# Patient Record
Sex: Female | Born: 1935 | Hispanic: Yes | Marital: Married | Smoking: Never smoker
Health system: Southern US, Community
[De-identification: ages and names within clinical notes are randomized; demographics above are authoritative.]

## PROBLEM LIST (undated history)

## (undated) DIAGNOSIS — I10 Essential (primary) hypertension: Secondary | ICD-10-CM

## (undated) DIAGNOSIS — K5792 Diverticulitis of intestine, part unspecified, without perforation or abscess without bleeding: Secondary | ICD-10-CM

## (undated) DIAGNOSIS — H269 Unspecified cataract: Secondary | ICD-10-CM

## (undated) DIAGNOSIS — A048 Other specified bacterial intestinal infections: Secondary | ICD-10-CM

## (undated) DIAGNOSIS — Z86010 Personal history of colonic polyps: Secondary | ICD-10-CM

## (undated) DIAGNOSIS — E785 Hyperlipidemia, unspecified: Secondary | ICD-10-CM

## (undated) DIAGNOSIS — H409 Unspecified glaucoma: Secondary | ICD-10-CM

## (undated) DIAGNOSIS — Z860101 Personal history of adenomatous and serrated colon polyps: Secondary | ICD-10-CM

## (undated) DIAGNOSIS — I671 Cerebral aneurysm, nonruptured: Secondary | ICD-10-CM

## (undated) DIAGNOSIS — M1990 Unspecified osteoarthritis, unspecified site: Secondary | ICD-10-CM

## (undated) DIAGNOSIS — C4492 Squamous cell carcinoma of skin, unspecified: Secondary | ICD-10-CM

## (undated) DIAGNOSIS — K5790 Diverticulosis of intestine, part unspecified, without perforation or abscess without bleeding: Secondary | ICD-10-CM

## (undated) DIAGNOSIS — R011 Cardiac murmur, unspecified: Secondary | ICD-10-CM

## (undated) DIAGNOSIS — T7840XA Allergy, unspecified, initial encounter: Secondary | ICD-10-CM

## (undated) DIAGNOSIS — F419 Anxiety disorder, unspecified: Secondary | ICD-10-CM

## (undated) DIAGNOSIS — T148XXA Other injury of unspecified body region, initial encounter: Secondary | ICD-10-CM

## (undated) HISTORY — PX: BREAST BIOPSY: SHX20

## (undated) HISTORY — DX: Personal history of adenomatous and serrated colon polyps: Z86.0101

## (undated) HISTORY — DX: Unspecified glaucoma: H40.9

## (undated) HISTORY — DX: Diverticulosis of intestine, part unspecified, without perforation or abscess without bleeding: K57.90

## (undated) HISTORY — DX: Hyperlipidemia, unspecified: E78.5

## (undated) HISTORY — DX: Anxiety disorder, unspecified: F41.9

## (undated) HISTORY — DX: Unspecified osteoarthritis, unspecified site: M19.90

## (undated) HISTORY — DX: Diverticulitis of intestine, part unspecified, without perforation or abscess without bleeding: K57.92

## (undated) HISTORY — DX: Unspecified cataract: H26.9

## (undated) HISTORY — PX: BREAST LUMPECTOMY: SHX2

## (undated) HISTORY — DX: Essential (primary) hypertension: I10

## (undated) HISTORY — PX: COLONOSCOPY: SHX174

## (undated) HISTORY — DX: Cerebral aneurysm, nonruptured: I67.1

## (undated) HISTORY — DX: Other injury of unspecified body region, initial encounter: T14.8XXA

## (undated) HISTORY — DX: Personal history of colonic polyps: Z86.010

## (undated) HISTORY — DX: Squamous cell carcinoma of skin, unspecified: C44.92

## (undated) HISTORY — DX: Cardiac murmur, unspecified: R01.1

## (undated) HISTORY — DX: Other specified bacterial intestinal infections: A04.8

## (undated) HISTORY — DX: Allergy, unspecified, initial encounter: T78.40XA

---

## 1999-01-07 ENCOUNTER — Other Ambulatory Visit: Admission: RE | Admit: 1999-01-07 | Discharge: 1999-01-07 | Payer: Self-pay | Admitting: Gynecology

## 1999-04-18 DIAGNOSIS — A048 Other specified bacterial intestinal infections: Secondary | ICD-10-CM

## 1999-04-18 HISTORY — DX: Other specified bacterial intestinal infections: A04.8

## 1999-05-25 ENCOUNTER — Encounter (INDEPENDENT_AMBULATORY_CARE_PROVIDER_SITE_OTHER): Payer: Self-pay | Admitting: Specialist

## 1999-05-25 ENCOUNTER — Other Ambulatory Visit: Admission: RE | Admit: 1999-05-25 | Discharge: 1999-05-25 | Payer: Self-pay | Admitting: Internal Medicine

## 1999-05-25 ENCOUNTER — Encounter (INDEPENDENT_AMBULATORY_CARE_PROVIDER_SITE_OTHER): Payer: Self-pay | Admitting: *Deleted

## 2000-03-15 ENCOUNTER — Other Ambulatory Visit: Admission: RE | Admit: 2000-03-15 | Discharge: 2000-03-15 | Payer: Self-pay | Admitting: Gynecology

## 2001-03-18 ENCOUNTER — Other Ambulatory Visit: Admission: RE | Admit: 2001-03-18 | Discharge: 2001-03-18 | Payer: Self-pay | Admitting: Gynecology

## 2002-03-24 ENCOUNTER — Other Ambulatory Visit: Admission: RE | Admit: 2002-03-24 | Discharge: 2002-03-24 | Payer: Self-pay | Admitting: Gynecology

## 2003-03-30 ENCOUNTER — Other Ambulatory Visit: Admission: RE | Admit: 2003-03-30 | Discharge: 2003-03-30 | Payer: Self-pay | Admitting: Gynecology

## 2003-04-20 ENCOUNTER — Encounter (HOSPITAL_COMMUNITY): Admission: RE | Admit: 2003-04-20 | Discharge: 2003-07-19 | Payer: Self-pay | Admitting: Surgery

## 2004-04-28 ENCOUNTER — Other Ambulatory Visit: Admission: RE | Admit: 2004-04-28 | Discharge: 2004-04-28 | Payer: Self-pay | Admitting: Gynecology

## 2004-05-12 ENCOUNTER — Ambulatory Visit: Payer: Self-pay | Admitting: Internal Medicine

## 2004-08-11 ENCOUNTER — Ambulatory Visit: Payer: Self-pay | Admitting: Internal Medicine

## 2004-09-13 ENCOUNTER — Ambulatory Visit: Payer: Self-pay | Admitting: Internal Medicine

## 2004-10-27 ENCOUNTER — Ambulatory Visit: Payer: Self-pay | Admitting: Internal Medicine

## 2004-11-03 ENCOUNTER — Ambulatory Visit: Payer: Self-pay | Admitting: Internal Medicine

## 2004-11-09 ENCOUNTER — Ambulatory Visit: Payer: Self-pay | Admitting: Internal Medicine

## 2004-11-17 ENCOUNTER — Ambulatory Visit: Payer: Self-pay | Admitting: Internal Medicine

## 2005-01-26 ENCOUNTER — Ambulatory Visit: Payer: Self-pay | Admitting: Internal Medicine

## 2005-02-02 ENCOUNTER — Ambulatory Visit: Payer: Self-pay | Admitting: Internal Medicine

## 2005-04-27 ENCOUNTER — Ambulatory Visit: Payer: Self-pay | Admitting: Internal Medicine

## 2005-05-05 ENCOUNTER — Ambulatory Visit: Payer: Self-pay | Admitting: Internal Medicine

## 2005-09-13 ENCOUNTER — Ambulatory Visit: Payer: Self-pay | Admitting: Internal Medicine

## 2005-09-21 ENCOUNTER — Ambulatory Visit: Payer: Self-pay | Admitting: Internal Medicine

## 2005-10-23 ENCOUNTER — Ambulatory Visit: Payer: Self-pay | Admitting: Internal Medicine

## 2005-11-10 ENCOUNTER — Ambulatory Visit: Payer: Self-pay | Admitting: Internal Medicine

## 2005-12-15 ENCOUNTER — Ambulatory Visit: Payer: Self-pay | Admitting: Internal Medicine

## 2006-01-12 ENCOUNTER — Ambulatory Visit: Payer: Self-pay | Admitting: Internal Medicine

## 2006-01-15 ENCOUNTER — Encounter: Admission: RE | Admit: 2006-01-15 | Discharge: 2006-01-15 | Payer: Self-pay | Admitting: Internal Medicine

## 2006-02-13 ENCOUNTER — Ambulatory Visit: Payer: Self-pay | Admitting: Internal Medicine

## 2006-03-16 ENCOUNTER — Other Ambulatory Visit: Admission: RE | Admit: 2006-03-16 | Discharge: 2006-03-16 | Payer: Self-pay | Admitting: Gynecology

## 2006-05-15 ENCOUNTER — Ambulatory Visit: Payer: Self-pay | Admitting: Internal Medicine

## 2006-05-15 LAB — CONVERTED CEMR LAB: Hgb A1c MFr Bld: 6.8 % — ABNORMAL HIGH (ref 4.6–6.0)

## 2006-06-21 ENCOUNTER — Ambulatory Visit: Payer: Self-pay | Admitting: Internal Medicine

## 2006-08-23 ENCOUNTER — Ambulatory Visit: Payer: Self-pay | Admitting: Internal Medicine

## 2006-08-23 LAB — CONVERTED CEMR LAB
ALT: 29 units/L (ref 0–40)
AST: 32 units/L (ref 0–37)
Albumin: 3.9 g/dL (ref 3.5–5.2)
Alkaline Phosphatase: 75 units/L (ref 39–117)
Basophils Absolute: 0.1 10*3/uL (ref 0.0–0.1)
Basophils Relative: 0.8 % (ref 0.0–1.0)
Bilirubin, Direct: 0.1 mg/dL (ref 0.0–0.3)
Cholesterol: 191 mg/dL (ref 0–200)
Eosinophils Absolute: 0.7 10*3/uL — ABNORMAL HIGH (ref 0.0–0.6)
Eosinophils Relative: 8.6 % — ABNORMAL HIGH (ref 0.0–5.0)
HCT: 40.3 % (ref 36.0–46.0)
HDL: 51.7 mg/dL (ref >39.0)
Hemoglobin: 14.1 g/dL (ref 12.0–15.0)
Hgb A1c MFr Bld: 6.9 % — ABNORMAL HIGH (ref 4.6–6.0)
LDL Cholesterol: 121 mg/dL — ABNORMAL HIGH (ref 0–99)
Lymphocytes Relative: 28.5 % (ref 12.0–46.0)
MCHC: 35 g/dL (ref 30.0–36.0)
MCV: 90 fL (ref 78.0–100.0)
Monocytes Absolute: 0.7 10*3/uL (ref 0.2–0.7)
Monocytes Relative: 7.6 % (ref 3.0–11.0)
Neutro Abs: 4.7 10*3/uL (ref 1.4–7.7)
Neutrophils Relative %: 54.5 % (ref 43.0–77.0)
Platelets: 339 10*3/uL (ref 150–400)
RBC: 4.48 M/uL (ref 3.87–5.11)
RDW: 11.9 % (ref 11.5–14.6)
Total Bilirubin: 0.6 mg/dL (ref 0.3–1.2)
Total CHOL/HDL Ratio: 3.7
Total Protein: 7.6 g/dL (ref 6.0–8.3)
Triglycerides: 93 mg/dL (ref 0–149)
VLDL: 19 mg/dL (ref 0–40)
WBC: 8.7 10*3/uL (ref 4.5–10.5)

## 2006-10-25 ENCOUNTER — Ambulatory Visit: Payer: Self-pay | Admitting: Internal Medicine

## 2006-12-03 ENCOUNTER — Ambulatory Visit: Payer: Self-pay | Admitting: Internal Medicine

## 2006-12-03 DIAGNOSIS — M1990 Unspecified osteoarthritis, unspecified site: Secondary | ICD-10-CM | POA: Insufficient documentation

## 2006-12-03 DIAGNOSIS — M549 Dorsalgia, unspecified: Secondary | ICD-10-CM | POA: Insufficient documentation

## 2006-12-03 DIAGNOSIS — J309 Allergic rhinitis, unspecified: Secondary | ICD-10-CM | POA: Insufficient documentation

## 2006-12-03 DIAGNOSIS — M545 Low back pain, unspecified: Secondary | ICD-10-CM | POA: Insufficient documentation

## 2006-12-03 DIAGNOSIS — I10 Essential (primary) hypertension: Secondary | ICD-10-CM | POA: Insufficient documentation

## 2006-12-03 DIAGNOSIS — E119 Type 2 diabetes mellitus without complications: Secondary | ICD-10-CM | POA: Insufficient documentation

## 2006-12-03 DIAGNOSIS — M810 Age-related osteoporosis without current pathological fracture: Secondary | ICD-10-CM | POA: Insufficient documentation

## 2006-12-06 ENCOUNTER — Encounter: Admission: RE | Admit: 2006-12-06 | Discharge: 2006-12-06 | Payer: Self-pay | Admitting: Internal Medicine

## 2007-01-18 ENCOUNTER — Ambulatory Visit: Payer: Self-pay | Admitting: Internal Medicine

## 2007-01-18 LAB — CONVERTED CEMR LAB
ALT: 27 units/L (ref 0–35)
AST: 31 units/L (ref 0–37)
Albumin: 4.4 g/dL (ref 3.5–5.2)
Alkaline Phosphatase: 87 units/L (ref 39–117)
BUN: 21 mg/dL (ref 6–23)
Basophils Absolute: 0 10*3/uL (ref 0.0–0.1)
Basophils Relative: 0.5 % (ref 0.0–1.0)
Bilirubin Urine: NEGATIVE
Bilirubin, Direct: 0.2 mg/dL (ref 0.0–0.3)
CO2: 32 meq/L (ref 19–32)
Calcium: 10.2 mg/dL (ref 8.4–10.5)
Chloride: 104 meq/L (ref 96–112)
Cholesterol: 193 mg/dL (ref 0–200)
Creatinine, Ser: 0.9 mg/dL (ref 0.4–1.2)
Creatinine,U: 128 mg/dL
Eosinophils Absolute: 1.1 10*3/uL — ABNORMAL HIGH (ref 0.0–0.6)
Eosinophils Relative: 11.3 % — ABNORMAL HIGH (ref 0.0–5.0)
GFR calc Af Amer: 79 mL/min
GFR calc non Af Amer: 66 mL/min
Glucose, Bld: 135 mg/dL — ABNORMAL HIGH (ref 70–99)
Glucose, Urine, Semiquant: NEGATIVE
HCT: 40.5 % (ref 36.0–46.0)
HDL: 47 mg/dL (ref >39.0)
Hemoglobin: 14.2 g/dL (ref 12.0–15.0)
Hgb A1c MFr Bld: 7 % — ABNORMAL HIGH (ref 4.6–6.0)
Ketones, urine, test strip: NEGATIVE
LDL Cholesterol: 122 mg/dL — ABNORMAL HIGH (ref 0–99)
Lymphocytes Relative: 29.9 % (ref 12.0–46.0)
MCHC: 35 g/dL (ref 30.0–36.0)
MCV: 90.7 fL (ref 78.0–100.0)
Microalb Creat Ratio: 12.5 mg/g (ref 0.0–30.0)
Microalb, Ur: 1.6 mg/dL (ref 0.0–1.9)
Monocytes Absolute: 0.7 10*3/uL (ref 0.2–0.7)
Monocytes Relative: 7 % (ref 3.0–11.0)
Neutro Abs: 5 10*3/uL (ref 1.4–7.7)
Neutrophils Relative %: 51.3 % (ref 43.0–77.0)
Nitrite: NEGATIVE
Platelets: 351 10*3/uL (ref 150–400)
Potassium: 5.1 meq/L (ref 3.5–5.1)
Protein, U semiquant: NEGATIVE
RBC: 4.46 M/uL (ref 3.87–5.11)
RDW: 12.1 % (ref 11.5–14.6)
Sodium: 142 meq/L (ref 135–145)
Specific Gravity, Urine: 1.015
TSH: 0.73 microintl units/mL (ref 0.35–5.50)
Total Bilirubin: 0.9 mg/dL (ref 0.3–1.2)
Total CHOL/HDL Ratio: 4.1
Total Protein: 8 g/dL (ref 6.0–8.3)
Triglycerides: 121 mg/dL (ref 0–149)
Urobilinogen, UA: 0.2
VLDL: 24 mg/dL (ref 0–40)
WBC Urine, dipstick: NEGATIVE
WBC: 9.7 10*3/uL (ref 4.5–10.5)
pH: 8.5

## 2007-02-01 ENCOUNTER — Ambulatory Visit: Payer: Self-pay | Admitting: Internal Medicine

## 2007-03-19 ENCOUNTER — Ambulatory Visit: Payer: Self-pay | Admitting: Internal Medicine

## 2007-04-25 ENCOUNTER — Ambulatory Visit: Payer: Self-pay | Admitting: Internal Medicine

## 2007-04-25 LAB — CONVERTED CEMR LAB
BUN: 19 mg/dL (ref 6–23)
CO2: 33 meq/L — ABNORMAL HIGH (ref 19–32)
Calcium: 10.5 mg/dL (ref 8.4–10.5)
Chloride: 99 meq/L (ref 96–112)
Creatinine, Ser: 1 mg/dL (ref 0.4–1.2)
GFR calc Af Amer: 70 mL/min
GFR calc non Af Amer: 58 mL/min
Glucose, Bld: 91 mg/dL (ref 70–99)
Hgb A1c MFr Bld: 6.8 % — ABNORMAL HIGH (ref 4.6–6.0)
Potassium: 5.1 meq/L (ref 3.5–5.1)
Sodium: 139 meq/L (ref 135–145)

## 2007-05-02 ENCOUNTER — Other Ambulatory Visit: Admission: RE | Admit: 2007-05-02 | Discharge: 2007-05-02 | Payer: Self-pay | Admitting: Gynecology

## 2007-05-14 ENCOUNTER — Encounter: Payer: Self-pay | Admitting: Internal Medicine

## 2007-07-25 ENCOUNTER — Ambulatory Visit: Payer: Self-pay | Admitting: Internal Medicine

## 2007-07-25 LAB — CONVERTED CEMR LAB
Hgb A1c MFr Bld: 6.2 % — ABNORMAL HIGH (ref 4.6–6.0)
Vit D, 1,25-Dihydroxy: 38 (ref 30–89)

## 2007-09-03 ENCOUNTER — Ambulatory Visit: Payer: Self-pay | Admitting: Internal Medicine

## 2007-09-03 ENCOUNTER — Telehealth: Payer: Self-pay | Admitting: Internal Medicine

## 2007-09-03 DIAGNOSIS — J209 Acute bronchitis, unspecified: Secondary | ICD-10-CM | POA: Insufficient documentation

## 2007-09-03 LAB — CONVERTED CEMR LAB: Inflenza A Ag: NEGATIVE

## 2007-10-01 ENCOUNTER — Ambulatory Visit: Payer: Self-pay | Admitting: Internal Medicine

## 2007-10-01 DIAGNOSIS — J45991 Cough variant asthma: Secondary | ICD-10-CM | POA: Insufficient documentation

## 2007-10-01 DIAGNOSIS — B351 Tinea unguium: Secondary | ICD-10-CM | POA: Insufficient documentation

## 2007-11-26 ENCOUNTER — Ambulatory Visit: Payer: Self-pay | Admitting: Internal Medicine

## 2007-11-26 LAB — CONVERTED CEMR LAB
ALT: 34 units/L (ref 0–35)
AST: 36 units/L (ref 0–37)
Albumin: 4.3 g/dL (ref 3.5–5.2)
Alkaline Phosphatase: 78 units/L (ref 39–117)
Bilirubin, Direct: 0.1 mg/dL (ref 0.0–0.3)
Cholesterol: 226 mg/dL (ref 0–200)
Direct LDL: 151.1 mg/dL
HDL: 57.9 mg/dL (ref >39.0)
Hgb A1c MFr Bld: 6.9 % — ABNORMAL HIGH (ref 4.6–6.0)
Total Bilirubin: 0.8 mg/dL (ref 0.3–1.2)
Total CHOL/HDL Ratio: 3.9
Total Protein: 7.8 g/dL (ref 6.0–8.3)
Triglycerides: 129 mg/dL (ref 0–149)
VLDL: 26 mg/dL (ref 0–40)

## 2007-12-03 ENCOUNTER — Ambulatory Visit: Payer: Self-pay | Admitting: Internal Medicine

## 2008-01-16 ENCOUNTER — Ambulatory Visit: Payer: Self-pay | Admitting: Internal Medicine

## 2008-01-28 ENCOUNTER — Ambulatory Visit: Payer: Self-pay | Admitting: Internal Medicine

## 2008-01-28 LAB — CONVERTED CEMR LAB
ALT: 36 units/L — ABNORMAL HIGH (ref 0–35)
AST: 36 units/L (ref 0–37)
Albumin: 4.2 g/dL (ref 3.5–5.2)
Alkaline Phosphatase: 79 units/L (ref 39–117)
Bilirubin, Direct: 0.1 mg/dL (ref 0.0–0.3)
Cholesterol: 191 mg/dL (ref 0–200)
HDL: 51.6 mg/dL (ref >39.0)
Hgb A1c MFr Bld: 6.4 % — ABNORMAL HIGH (ref 4.6–6.0)
LDL Cholesterol: 115 mg/dL — ABNORMAL HIGH (ref 0–99)
Total Bilirubin: 0.7 mg/dL (ref 0.3–1.2)
Total CHOL/HDL Ratio: 3.7
Total Protein: 7.6 g/dL (ref 6.0–8.3)
Triglycerides: 121 mg/dL (ref 0–149)
VLDL: 24 mg/dL (ref 0–40)

## 2008-02-04 ENCOUNTER — Ambulatory Visit: Payer: Self-pay | Admitting: Internal Medicine

## 2008-03-11 ENCOUNTER — Ambulatory Visit: Payer: Self-pay | Admitting: Internal Medicine

## 2008-03-11 LAB — CONVERTED CEMR LAB
ALT: 34 units/L (ref 0–35)
AST: 39 units/L — ABNORMAL HIGH (ref 0–37)
Albumin: 4.5 g/dL (ref 3.5–5.2)
Alkaline Phosphatase: 79 units/L (ref 39–117)
BUN: 34 mg/dL — ABNORMAL HIGH (ref 6–23)
Basophils Absolute: 0.1 10*3/uL (ref 0.0–0.1)
Basophils Relative: 0.6 % (ref 0.0–3.0)
Bilirubin Urine: NEGATIVE
Bilirubin, Direct: 0.1 mg/dL (ref 0.0–0.3)
Blood in Urine, dipstick: NEGATIVE
CO2: 35 meq/L — ABNORMAL HIGH (ref 19–32)
Calcium: 10.4 mg/dL (ref 8.4–10.5)
Chloride: 103 meq/L (ref 96–112)
Cholesterol: 222 mg/dL (ref 0–200)
Creatinine, Ser: 1.2 mg/dL (ref 0.4–1.2)
Creatinine,U: 190.3 mg/dL
Direct LDL: 124.8 mg/dL
Eosinophils Absolute: 1 10*3/uL — ABNORMAL HIGH (ref 0.0–0.7)
Eosinophils Relative: 9.8 % — ABNORMAL HIGH (ref 0.0–5.0)
GFR calc Af Amer: 57 mL/min
GFR calc non Af Amer: 47 mL/min
Glucose, Bld: 157 mg/dL — ABNORMAL HIGH (ref 70–99)
Glucose, Urine, Semiquant: NEGATIVE
HCT: 43.1 % (ref 36.0–46.0)
HDL: 62.3 mg/dL (ref >39.0)
Hemoglobin: 14.8 g/dL (ref 12.0–15.0)
Hgb A1c MFr Bld: 6.7 % — ABNORMAL HIGH (ref 4.6–6.0)
Ketones, urine, test strip: NEGATIVE
Lymphocytes Relative: 23.2 % (ref 12.0–46.0)
MCHC: 34.3 g/dL (ref 30.0–36.0)
MCV: 93.3 fL (ref 78.0–100.0)
Microalb Creat Ratio: 5.8 mg/g (ref 0.0–30.0)
Microalb, Ur: 1.1 mg/dL (ref 0.0–1.9)
Monocytes Absolute: 0.7 10*3/uL (ref 0.1–1.0)
Monocytes Relative: 7.1 % (ref 3.0–12.0)
Neutro Abs: 5.8 10*3/uL (ref 1.4–7.7)
Neutrophils Relative %: 59.3 % (ref 43.0–77.0)
Nitrite: NEGATIVE
Platelets: 337 10*3/uL (ref 150–400)
Potassium: 5.1 meq/L (ref 3.5–5.1)
RBC: 4.62 M/uL (ref 3.87–5.11)
RDW: 12.1 % (ref 11.5–14.6)
Sodium: 146 meq/L — ABNORMAL HIGH (ref 135–145)
Specific Gravity, Urine: 1.015
TSH: 1.19 microintl units/mL (ref 0.35–5.50)
Total Bilirubin: 0.9 mg/dL (ref 0.3–1.2)
Total CHOL/HDL Ratio: 3.6
Total Protein: 8.7 g/dL — ABNORMAL HIGH (ref 6.0–8.3)
Triglycerides: 87 mg/dL (ref 0–149)
Urobilinogen, UA: 0.2
VLDL: 17 mg/dL (ref 0–40)
WBC: 9.9 10*3/uL (ref 4.5–10.5)
pH: 8.5

## 2008-03-18 ENCOUNTER — Ambulatory Visit: Payer: Self-pay | Admitting: Internal Medicine

## 2008-03-18 DIAGNOSIS — K141 Geographic tongue: Secondary | ICD-10-CM | POA: Insufficient documentation

## 2008-05-01 ENCOUNTER — Encounter: Payer: Self-pay | Admitting: Internal Medicine

## 2008-05-05 ENCOUNTER — Other Ambulatory Visit: Admission: RE | Admit: 2008-05-05 | Discharge: 2008-05-05 | Payer: Self-pay | Admitting: Gynecology

## 2008-05-05 ENCOUNTER — Encounter: Payer: Self-pay | Admitting: Internal Medicine

## 2008-05-05 ENCOUNTER — Encounter: Payer: Self-pay | Admitting: Gynecology

## 2008-05-05 ENCOUNTER — Ambulatory Visit: Payer: Self-pay | Admitting: Gynecology

## 2008-05-20 ENCOUNTER — Ambulatory Visit: Payer: Self-pay | Admitting: Internal Medicine

## 2008-05-20 LAB — CONVERTED CEMR LAB
BUN: 15 mg/dL (ref 6–23)
CO2: 32 meq/L (ref 19–32)
Calcium: 10.2 mg/dL (ref 8.4–10.5)
Chloride: 103 meq/L (ref 96–112)
Creatinine, Ser: 0.8 mg/dL (ref 0.4–1.2)
GFR calc Af Amer: 91 mL/min
GFR calc non Af Amer: 75 mL/min
Glucose, Bld: 131 mg/dL — ABNORMAL HIGH (ref 70–99)
Hgb A1c MFr Bld: 6.6 % — ABNORMAL HIGH (ref 4.6–6.0)
Potassium: 4.8 meq/L (ref 3.5–5.1)
Sodium: 140 meq/L (ref 135–145)

## 2008-05-25 DIAGNOSIS — K5790 Diverticulosis of intestine, part unspecified, without perforation or abscess without bleeding: Secondary | ICD-10-CM | POA: Insufficient documentation

## 2008-05-26 ENCOUNTER — Ambulatory Visit: Payer: Self-pay | Admitting: Internal Medicine

## 2008-05-26 DIAGNOSIS — R142 Eructation: Secondary | ICD-10-CM

## 2008-05-26 DIAGNOSIS — R143 Flatulence: Secondary | ICD-10-CM

## 2008-05-26 DIAGNOSIS — R141 Gas pain: Secondary | ICD-10-CM | POA: Insufficient documentation

## 2008-05-26 DIAGNOSIS — K5520 Angiodysplasia of colon without hemorrhage: Secondary | ICD-10-CM | POA: Insufficient documentation

## 2008-05-26 DIAGNOSIS — K625 Hemorrhage of anus and rectum: Secondary | ICD-10-CM | POA: Insufficient documentation

## 2008-06-02 ENCOUNTER — Ambulatory Visit: Payer: Self-pay | Admitting: Gynecology

## 2008-06-12 LAB — CONVERTED CEMR LAB: Pap Smear: NORMAL

## 2008-06-17 ENCOUNTER — Encounter: Payer: Self-pay | Admitting: Internal Medicine

## 2008-06-17 ENCOUNTER — Ambulatory Visit: Payer: Self-pay | Admitting: Internal Medicine

## 2008-06-24 LAB — HM COLONOSCOPY

## 2008-07-03 ENCOUNTER — Encounter: Payer: Self-pay | Admitting: Internal Medicine

## 2008-08-20 ENCOUNTER — Ambulatory Visit: Payer: Self-pay | Admitting: Internal Medicine

## 2008-10-07 ENCOUNTER — Ambulatory Visit: Payer: Self-pay | Admitting: Internal Medicine

## 2008-10-07 DIAGNOSIS — R0789 Other chest pain: Secondary | ICD-10-CM | POA: Insufficient documentation

## 2008-10-16 ENCOUNTER — Ambulatory Visit: Payer: Self-pay

## 2008-10-16 ENCOUNTER — Encounter: Payer: Self-pay | Admitting: Internal Medicine

## 2008-11-02 ENCOUNTER — Ambulatory Visit: Payer: Self-pay | Admitting: Internal Medicine

## 2008-11-02 DIAGNOSIS — R609 Edema, unspecified: Secondary | ICD-10-CM | POA: Insufficient documentation

## 2008-11-02 LAB — CONVERTED CEMR LAB
BUN: 31 mg/dL — ABNORMAL HIGH (ref 6–23)
Basophils Absolute: 0 10*3/uL (ref 0.0–0.1)
Basophils Relative: 0 % (ref 0.0–3.0)
CO2: 29 meq/L (ref 19–32)
Calcium: 9.9 mg/dL (ref 8.4–10.5)
Chloride: 105 meq/L (ref 96–112)
Creatinine, Ser: 1.1 mg/dL (ref 0.4–1.2)
Creatinine,U: 44.2 mg/dL
Eosinophils Absolute: 0.6 10*3/uL (ref 0.0–0.7)
Eosinophils Relative: 5.8 % — ABNORMAL HIGH (ref 0.0–5.0)
GFR calc non Af Amer: 51.77 mL/min (ref >60)
Glucose, Bld: 95 mg/dL (ref 70–99)
HCT: 39 % (ref 36.0–46.0)
Hemoglobin: 13.5 g/dL (ref 12.0–15.0)
Hgb A1c MFr Bld: 6.9 % — ABNORMAL HIGH (ref 4.6–6.5)
Lymphocytes Relative: 20.1 % (ref 12.0–46.0)
Lymphs Abs: 2.2 10*3/uL (ref 0.7–4.0)
MCHC: 34.7 g/dL (ref 30.0–36.0)
MCV: 90.7 fL (ref 78.0–100.0)
Microalb Creat Ratio: 6.8 mg/g (ref 0.0–30.0)
Microalb, Ur: 0.3 mg/dL (ref 0.0–1.9)
Monocytes Absolute: 0.4 10*3/uL (ref 0.1–1.0)
Monocytes Relative: 3.8 % (ref 3.0–12.0)
Neutro Abs: 7.6 10*3/uL (ref 1.4–7.7)
Neutrophils Relative %: 70.3 % (ref 43.0–77.0)
Platelets: 277 10*3/uL (ref 150.0–400.0)
Potassium: 4.5 meq/L (ref 3.5–5.1)
RBC: 4.3 M/uL (ref 3.87–5.11)
RDW: 12.4 % (ref 11.5–14.6)
Sodium: 140 meq/L (ref 135–145)
WBC: 10.8 10*3/uL — ABNORMAL HIGH (ref 4.5–10.5)

## 2008-11-06 LAB — CONVERTED CEMR LAB: IgE (Immunoglobulin E), Serum: 844.4 intl units/mL — ABNORMAL HIGH (ref 0.0–180.0)

## 2008-12-02 ENCOUNTER — Ambulatory Visit: Payer: Self-pay | Admitting: Internal Medicine

## 2008-12-02 LAB — CONVERTED CEMR LAB: Uric Acid, Serum: 7.6 mg/dL — ABNORMAL HIGH (ref 2.4–7.0)

## 2008-12-04 ENCOUNTER — Telehealth: Payer: Self-pay | Admitting: Family Medicine

## 2009-01-05 ENCOUNTER — Telehealth: Payer: Self-pay | Admitting: Internal Medicine

## 2009-01-07 ENCOUNTER — Encounter: Payer: Self-pay | Admitting: Internal Medicine

## 2009-03-05 ENCOUNTER — Ambulatory Visit: Payer: Self-pay | Admitting: Internal Medicine

## 2009-03-05 DIAGNOSIS — I83893 Varicose veins of bilateral lower extremities with other complications: Secondary | ICD-10-CM | POA: Insufficient documentation

## 2009-03-29 ENCOUNTER — Ambulatory Visit: Payer: Self-pay | Admitting: Internal Medicine

## 2009-03-29 LAB — CONVERTED CEMR LAB
ALT: 17 units/L (ref 0–35)
AST: 21 units/L (ref 0–37)
Albumin: 4 g/dL (ref 3.5–5.2)
Alkaline Phosphatase: 68 units/L (ref 39–117)
BUN: 20 mg/dL (ref 6–23)
Basophils Absolute: 0.1 10*3/uL (ref 0.0–0.1)
Basophils Relative: 0.5 % (ref 0.0–3.0)
Bilirubin Urine: NEGATIVE
Bilirubin, Direct: 0 mg/dL (ref 0.0–0.3)
Blood in Urine, dipstick: NEGATIVE
CO2: 32 meq/L (ref 19–32)
Calcium: 9.6 mg/dL (ref 8.4–10.5)
Chloride: 106 meq/L (ref 96–112)
Cholesterol: 197 mg/dL (ref 0–200)
Creatinine, Ser: 1 mg/dL (ref 0.4–1.2)
Creatinine,U: 83.9 mg/dL
Eosinophils Absolute: 2 10*3/uL — ABNORMAL HIGH (ref 0.0–0.7)
Eosinophils Relative: 17.6 % — ABNORMAL HIGH (ref 0.0–5.0)
GFR calc non Af Amer: 57.73 mL/min (ref >60)
Glucose, Bld: 122 mg/dL — ABNORMAL HIGH (ref 70–99)
Glucose, Urine, Semiquant: NEGATIVE
HCT: 38.1 % (ref 36.0–46.0)
HDL: 45.8 mg/dL (ref >39.00)
Hemoglobin: 13 g/dL (ref 12.0–15.0)
Hgb A1c MFr Bld: 6.8 % — ABNORMAL HIGH (ref 4.6–6.5)
Ketones, urine, test strip: NEGATIVE
LDL Cholesterol: 126 mg/dL — ABNORMAL HIGH (ref 0–99)
Lymphocytes Relative: 22.6 % (ref 12.0–46.0)
Lymphs Abs: 2.6 10*3/uL (ref 0.7–4.0)
MCHC: 34.1 g/dL (ref 30.0–36.0)
MCV: 94.3 fL (ref 78.0–100.0)
Microalb Creat Ratio: 2.4 mg/g (ref 0.0–30.0)
Microalb, Ur: 0.2 mg/dL (ref 0.0–1.9)
Monocytes Absolute: 0.9 10*3/uL (ref 0.1–1.0)
Monocytes Relative: 7.6 % (ref 3.0–12.0)
Neutro Abs: 5.8 10*3/uL (ref 1.4–7.7)
Neutrophils Relative %: 51.7 % (ref 43.0–77.0)
Nitrite: NEGATIVE
Platelets: 305 10*3/uL (ref 150.0–400.0)
Potassium: 5.3 meq/L — ABNORMAL HIGH (ref 3.5–5.1)
Protein, U semiquant: NEGATIVE
RBC: 4.04 M/uL (ref 3.87–5.11)
RDW: 11.6 % (ref 11.5–14.6)
Sodium: 144 meq/L (ref 135–145)
Specific Gravity, Urine: 1.015
TSH: 1.3 microintl units/mL (ref 0.35–5.50)
Total Bilirubin: 0.7 mg/dL (ref 0.3–1.2)
Total CHOL/HDL Ratio: 4
Total Protein: 7.8 g/dL (ref 6.0–8.3)
Triglycerides: 125 mg/dL (ref 0.0–149.0)
Urobilinogen, UA: 0.2
VLDL: 25 mg/dL (ref 0.0–40.0)
WBC: 11.4 10*3/uL — ABNORMAL HIGH (ref 4.5–10.5)
pH: 7

## 2009-04-05 ENCOUNTER — Ambulatory Visit: Payer: Self-pay | Admitting: Internal Medicine

## 2009-04-05 DIAGNOSIS — K5732 Diverticulitis of large intestine without perforation or abscess without bleeding: Secondary | ICD-10-CM | POA: Insufficient documentation

## 2009-04-05 LAB — HM MAMMOGRAPHY

## 2009-05-03 ENCOUNTER — Encounter: Payer: Self-pay | Admitting: Internal Medicine

## 2009-05-06 ENCOUNTER — Ambulatory Visit: Payer: Self-pay | Admitting: Internal Medicine

## 2009-05-06 DIAGNOSIS — C44309 Unspecified malignant neoplasm of skin of other parts of face: Secondary | ICD-10-CM | POA: Insufficient documentation

## 2009-05-11 ENCOUNTER — Ambulatory Visit: Payer: Self-pay | Admitting: Gynecology

## 2009-05-11 ENCOUNTER — Other Ambulatory Visit: Admission: RE | Admit: 2009-05-11 | Discharge: 2009-05-11 | Payer: Self-pay | Admitting: Gynecology

## 2009-06-01 ENCOUNTER — Ambulatory Visit: Payer: Self-pay | Admitting: Internal Medicine

## 2009-06-01 LAB — CONVERTED CEMR LAB: Vit D, 25-Hydroxy: 44 ng/mL (ref 30–89)

## 2009-07-19 ENCOUNTER — Telehealth: Payer: Self-pay | Admitting: Internal Medicine

## 2009-07-20 ENCOUNTER — Ambulatory Visit: Payer: Self-pay | Admitting: Internal Medicine

## 2009-07-21 ENCOUNTER — Ambulatory Visit: Payer: Self-pay | Admitting: Internal Medicine

## 2009-07-28 ENCOUNTER — Ambulatory Visit: Payer: Self-pay | Admitting: Internal Medicine

## 2009-07-28 LAB — CONVERTED CEMR LAB
ALT: 30 units/L (ref 0–35)
AST: 33 units/L (ref 0–37)
Albumin: 4.3 g/dL (ref 3.5–5.2)
Alkaline Phosphatase: 59 units/L (ref 39–117)
Bilirubin, Direct: 0 mg/dL (ref 0.0–0.3)
Cholesterol: 204 mg/dL — ABNORMAL HIGH (ref 0–200)
Direct LDL: 127.5 mg/dL
HDL: 62.4 mg/dL (ref >39.00)
Hgb A1c MFr Bld: 6.3 % (ref 4.6–6.5)
TSH: 1.18 microintl units/mL (ref 0.35–5.50)
Total Bilirubin: 0.3 mg/dL (ref 0.3–1.2)
Total CHOL/HDL Ratio: 3
Total Protein: 8.1 g/dL (ref 6.0–8.3)
Triglycerides: 116 mg/dL (ref 0.0–149.0)
VLDL: 23.2 mg/dL (ref 0.0–40.0)

## 2009-08-04 ENCOUNTER — Ambulatory Visit: Payer: Self-pay | Admitting: Internal Medicine

## 2009-08-04 DIAGNOSIS — E785 Hyperlipidemia, unspecified: Secondary | ICD-10-CM | POA: Insufficient documentation

## 2009-08-04 LAB — CONVERTED CEMR LAB
Cholesterol, target level: 200 mg/dL
HDL goal, serum: 40 mg/dL
LDL Goal: 100 mg/dL

## 2009-11-02 ENCOUNTER — Ambulatory Visit: Payer: Self-pay | Admitting: Internal Medicine

## 2009-11-02 LAB — CONVERTED CEMR LAB
BUN: 25 mg/dL — ABNORMAL HIGH (ref 6–23)
CO2: 34 meq/L — ABNORMAL HIGH (ref 19–32)
Calcium: 10.4 mg/dL (ref 8.4–10.5)
Chloride: 105 meq/L (ref 96–112)
Creatinine, Ser: 0.9 mg/dL (ref 0.4–1.2)
GFR calc non Af Amer: 63.46 mL/min (ref >60)
Glucose, Bld: 108 mg/dL — ABNORMAL HIGH (ref 70–99)
Hgb A1c MFr Bld: 6.4 % (ref 4.6–6.5)
Potassium: 5 meq/L (ref 3.5–5.1)
Sodium: 142 meq/L (ref 135–145)

## 2009-11-04 ENCOUNTER — Ambulatory Visit: Payer: Self-pay | Admitting: Internal Medicine

## 2009-12-03 ENCOUNTER — Ambulatory Visit: Payer: Self-pay | Admitting: Family Medicine

## 2009-12-13 ENCOUNTER — Ambulatory Visit: Payer: Self-pay | Admitting: Internal Medicine

## 2009-12-13 DIAGNOSIS — N750 Cyst of Bartholin's gland: Secondary | ICD-10-CM | POA: Insufficient documentation

## 2010-02-01 ENCOUNTER — Ambulatory Visit: Payer: Self-pay | Admitting: Internal Medicine

## 2010-02-01 LAB — CONVERTED CEMR LAB
BUN: 15 mg/dL (ref 6–23)
CO2: 29 meq/L (ref 19–32)
Calcium: 9.8 mg/dL (ref 8.4–10.5)
Chloride: 107 meq/L (ref 96–112)
Creatinine, Ser: 0.8 mg/dL (ref 0.4–1.2)
GFR calc non Af Amer: 72.42 mL/min (ref >60)
Glucose, Bld: 131 mg/dL — ABNORMAL HIGH (ref 70–99)
Hgb A1c MFr Bld: 6.8 % — ABNORMAL HIGH (ref 4.6–6.5)
Potassium: 4.8 meq/L (ref 3.5–5.1)
Sodium: 141 meq/L (ref 135–145)

## 2010-03-17 LAB — HM DIABETES EYE EXAM

## 2010-03-22 ENCOUNTER — Encounter: Payer: Self-pay | Admitting: Internal Medicine

## 2010-05-04 ENCOUNTER — Encounter: Payer: Self-pay | Admitting: Internal Medicine

## 2010-05-12 ENCOUNTER — Other Ambulatory Visit (HOSPITAL_COMMUNITY)
Admission: RE | Admit: 2010-05-12 | Discharge: 2010-05-12 | Disposition: A | Discharge status: Home / Self Care | Payer: MEDICARE | Source: Ambulatory Visit | Attending: Gynecology | Admitting: Gynecology

## 2010-05-12 ENCOUNTER — Other Ambulatory Visit: Payer: Self-pay | Admitting: Gynecology

## 2010-05-12 ENCOUNTER — Ambulatory Visit
Admission: RE | Admit: 2010-05-12 | Discharge: 2010-05-12 | Payer: Self-pay | Source: Home / Self Care | Attending: Gynecology | Admitting: Gynecology

## 2010-05-12 DIAGNOSIS — Z124 Encounter for screening for malignant neoplasm of cervix: Secondary | ICD-10-CM | POA: Insufficient documentation

## 2010-05-16 ENCOUNTER — Ambulatory Visit
Admission: RE | Admit: 2010-05-16 | Discharge: 2010-05-16 | Payer: Self-pay | Source: Home / Self Care | Attending: Internal Medicine | Admitting: Internal Medicine

## 2010-05-16 ENCOUNTER — Other Ambulatory Visit: Payer: Self-pay | Admitting: Internal Medicine

## 2010-05-16 LAB — BASIC METABOLIC PANEL
BUN: 23 mg/dL (ref 6–23)
CO2: 29 mEq/L (ref 19–32)
Calcium: 10.1 mg/dL (ref 8.4–10.5)
Chloride: 98 mEq/L (ref 96–112)
Creatinine, Ser: 1.2 mg/dL (ref 0.4–1.2)
GFR: 48.01 mL/min — ABNORMAL LOW (ref >60.00)
Glucose, Bld: 87 mg/dL (ref 70–99)
Potassium: 5.2 mEq/L — ABNORMAL HIGH (ref 3.5–5.1)
Sodium: 136 mEq/L (ref 135–145)

## 2010-05-16 LAB — HM DIABETES FOOT EXAM

## 2010-05-16 LAB — HEMOGLOBIN A1C: Hgb A1c MFr Bld: 6.8 % — ABNORMAL HIGH (ref 4.6–6.5)

## 2010-05-16 LAB — HDL CHOLESTEROL: HDL: 58.6 mg/dL (ref >39.00)

## 2010-05-16 LAB — LDL CHOLESTEROL, DIRECT: Direct LDL: 144.6 mg/dL

## 2010-05-17 ENCOUNTER — Ambulatory Visit
Admission: RE | Admit: 2010-05-17 | Discharge: 2010-05-17 | Payer: Self-pay | Source: Home / Self Care | Attending: Gynecology | Admitting: Gynecology

## 2010-05-19 NOTE — Assessment & Plan Note (Signed)
Summary: 2 month rov/njr   Vital Signs:  Patient Profile:   77 Years Old Female Height:     61 inches Weight:      151 pounds Temp:     98.6 degrees F oral Pulse rate:   72 / minute Resp:     14 per minute BP sitting:   132 / 74  (left arm)  Vitals Entered By: Willy Eddy, LPN (December 03, 2007 10:28 AM)                 Chief Complaint:  roa labs ? refill vitamond 50, 000 weekly??, and Type 2 diabetes mellitus follow-up.  History of Present Illness:  Type 2 Diabetes Mellitus Follow-Up      This is a 77 year old woman who presents for Type 2 diabetes mellitus follow-up.  The patient denies polyuria, polydipsia, blurred vision, self managed hypoglycemia, hypoglycemia requiring help, weight loss, weight gain, and numbness of extremities.  Since the last visit the patient reports poor dietary compliance.  The patient has been measuring capillary blood glucose before breakfast.  Since the last visit, the patient reports having had no eye care.  Complications from diabetes include renal insufficiency.      Current Allergies: No known allergies   Past Medical History:    Reviewed history from 12/03/2006 and no changes required:       Allergic rhinitis       Hypertension       Osteoporosis       Diabetes mellitus, type II       back pain       Osteoarthritis  Past Surgical History:    Reviewed history from 12/03/2006 and no changes required:       Denies surgical history  escept breast bx   Family History:    Reviewed history from 03/19/2007 and no changes required:       Family History Diabetes 1st degree relative       Family History Hypertension  Social History:    Reviewed history from 02/01/2007 and no changes required:       Retired       Married    Review of Systems  The patient denies anorexia, fever, weight loss, weight gain, vision loss, decreased hearing, hoarseness, chest pain, syncope, dyspnea on exertion, peripheral edema, prolonged cough,  headaches, hemoptysis, abdominal pain, melena, hematochezia, severe indigestion/heartburn, hematuria, incontinence, genital sores, muscle weakness, suspicious skin lesions, transient blindness, difficulty walking, depression, unusual weight change, abnormal bleeding, enlarged lymph nodes, angioedema, and breast masses.     Physical Exam  General:     Well-developed,well-nourished,in no acute distress; alert,appropriate and cooperative throughout examination Eyes:     No corneal or conjunctival inflammation noted. EOMI. Perrla. Funduscopic exam benign, without hemorrhages, exudates or papilledema. Vision grossly normal. Nose:     mucosal erythema and mucosal edema.   Mouth:     posterior lymphoid hypertrophy.   Neck:     No deformities, masses, or tenderness noted. Lungs:     increased bronchial bs Heart:     Normal rate and regular rhythm. S1 and S2 normal without gallop, murmur, click, rub or other extra sounds. Abdomen:     Bowel sounds positive,abdomen soft and non-tender without masses, organomegaly or hernias noted.    Impression & Recommendations:  Problem # 1:  DIABETES MELLITUS, TYPE II (ICD-250.00) DISCUSSOO OF DIET AND MEDICATON CHANGES IN 2 MONTHS IF aIC DOSE NOT RESPON Her updated medication list for this  problem includes:    Amaryl 2 Mg Tabs (Glimepiride) .Marland Kitchen... Take one (1) by mouth once a day    Cozaar 50 Mg Tabs (Losartan potassium) ..... One by mouth daily  Labs Reviewed: HgBA1c: 6.9 (11/26/2007)   Creat: 1.0 (04/25/2007)   Microalbumin: 1.6 (01/18/2007)   Problem # 2:  OSTEOARTHROSIS, LOCAL NOS, LOWER LEG (ICD-715.36)  Problem # 3:  HYPERTENSION (ICD-401.9)  Her updated medication list for this problem includes:    Cozaar 50 Mg Tabs (Losartan potassium) ..... One by mouth daily  BP today: 132/74 Prior BP: 140/80 (10/01/2007)  Prior 10 Yr Risk Heart Disease: 20 % (04/25/2007)  Labs Reviewed: Creat: 1.0 (04/25/2007) Chol: 226 (11/26/2007)   HDL: 57.9  (11/26/2007)   LDL: DEL (11/26/2007)   TG: 129 (11/26/2007)   Problem # 4:  DERMATOPHYTOSIS OF NAIL (ICD-110.1) Discussed nail care and medication treatment options.   Complete Medication List: 1)  Actonel 150 Mg Tabs (Risedronate sodium) .... One by mouth monthly as directed 2)  Xanax 0.25 Mg Tabs (Alprazolam) .... As needed 3)  Vitamin C 500 Mg Tabs (Ascorbic acid) .... As needed 4)  Chelated Zinc 23 Mg Lozg (Zinc) .... As needed 5)  Glucosamine 1500 Complex Caps (Glucosamine-chondroit-vit c-mn) .Marland Kitchen.. 1-3 once daily 6)  Chondroitin 1500  .... Once daily 7)  Amaryl 2 Mg Tabs (Glimepiride) .... Take one (1) by mouth once a day 8)  Cozaar 50 Mg Tabs (Losartan potassium) .... One by mouth daily 9)  Vitamin D 16109 Unit Caps (Ergocalciferol) .... One by mouth weekly   Patient Instructions: 1)  Please schedule a follow-up appointment in 2 months. 2)  Hepatic Panel prior to visit, ICD-9:994.50 3)  Lipid Panel prior to visit, ICD-9:272.4 4)  HbgA1C prior to visit, ICD-9:250.82   ]

## 2010-05-19 NOTE — Assessment & Plan Note (Signed)
Summary: cpx/njr   Vital Signs:  Patient profile:   77 year old female Height:      61 inches Weight:      152 pounds BMI:     28.82 Temp:     98.2 degrees F oral Pulse rate:   76 / minute Resp:     14 per minute BP sitting:   130 / 80  (left arm)  Vitals Entered By: Willy Eddy, LPN (April 05, 2009 12:19 PM) CC: annual visit for disease management+, Hypertension Management   CC:  annual visit for disease management+ and Hypertension Management.  Hypertension History:      She denies headache, chest pain, palpitations, dyspnea with exertion, orthopnea, PND, peripheral edema, visual symptoms, neurologic problems, syncope, and side effects from treatment.        Positive major cardiovascular risk factors include female age 10 years old or older, diabetes, hyperlipidemia, and hypertension.  Negative major cardiovascular risk factors include negative family history for ischemic heart disease and non-tobacco-user status.     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 One By Mouth Monthly As Directed 2)  Alprazolam 0.25 Mg Tbdp (Alprazolam) .... One By Mouth Three Times A Day  As Needed 3)  Vitamin C 500 Mg  Tabs (Ascorbic Acid) .... As Needed 4)  Chelated Zinc 23 Mg  Lozg (Zinc) .... As Needed 5)  Glucosamine 1500 Complex   Caps (Glucosamine-Chondroit-Vit C-Mn) .Marland Kitchen.. 1-3 Once Daily 6)  Chondroitin 1500 .... Once Daily 7)  Glucovance 2.5-500 Mg Tabs (Glyburide-Metformin) .... One By Mouth Bid 8)  Vitamin D 30865 Unit  Caps (Ergocalciferol) .... One By Mouth Weekly 9)  Onetouch Ultra Test  Strp (Glucose Blood) .... Test Two Times A Day As Directed 10)  Benicar 40 Mg Tabs (Olmesartan Medoxomil) .... One By Mouth Dailly 11)  Lasix 20 Mg Tabs (Furosemide) .... One By Mouth Daily 12)  Nystatin 100000 Unit/ml Susp (Nystatin) .Marland Kitchen.. 10 Cc Gargle and Swalloww Three Times A Day Prn 13)  One Touch Delica Lancets  Misc (Lancets) .... Use As Directed  Current Medications (verified): 1)  Actonel 150 Mg  Tabs (Risedronate Sodium) .... One By Mouth Monthly As Directed 2)  Alprazolam 0.25 Mg Tbdp (Alprazolam) .... One By Mouth Three Times A Day  As Needed 3)  Vitamin C 500 Mg  Tabs (Ascorbic Acid) .... As Needed 4)  Chelated Zinc 23 Mg  Lozg (Zinc) .... As Needed 5)  Glucosamine 1500 Complex   Caps (Glucosamine-Chondroit-Vit C-Mn) .Marland Kitchen.. 1-3 Once Daily 6)  Chondroitin 1500 .... Once Daily 7)  Glucovance 2.5-500 Mg Tabs (Glyburide-Metformin) .... One By Mouth Bid 8)  Vitamin D 16109 Unit  Caps (Ergocalciferol) .... One By Mouth Weekly 9)  Onetouch Ultra Test  Strp (Glucose Blood) .... Test Two Times A Day As Directed 10)  Benicar 40 Mg Tabs (Olmesartan Medoxomil) .... One By Mouth Dailly 11)  Lasix 20 Mg Tabs (Furosemide) .... One By Mouth Daily 12)  Nystatin 100000 Unit/ml Susp (Nystatin) .Marland Kitchen.. 10  Cc Gargle and Swalloww Three Times A Day Prn 13)  One Touch Delica Lancets  Misc (Lancets) .... Use As Directed 14)  Ciprofloxacin Hcl 500 Mg Tabs (Ciprofloxacin Hcl) .... One By Mouth Two Times A Day For 10 Days 15)  Metronidazole 250 Mg Tabs (Metronidazole) .... One By Mouth Three Times A Day  For 10 Days  Allergies (verified): No Known Drug Allergies  Past History:  Family History: Last updated: 05/26/2008 Family History Diabetes 1st degree relative Family History Hypertension Family History of Breast Cancer Maternal Aunts x2 Family History of Colon Cancer:Paternal Uncles x5 Family History of Heart Disease: Mother & Father  Social History: Last updated: 05/26/2008 Retired Married Patient has never smoked.  Alcohol Use - yes- rarely Illicit Drug Use - no  Risk Factors: Alcohol Use: 1 (03/19/2007) Exercise: yes (02/01/2007)  Risk Factors: Smoking Status: never (04/05/2009) Passive Smoke Exposure: no (03/19/2007)  Past medical, surgical, family and social histories (including risk factors) reviewed, and no changes noted (except as noted below).  Past Medical History: Reviewed history from 05/26/2008 and no changes required. Allergic rhinitis Hypertension Osteoporosis Diabetes mellitus, type II back pain Osteoarthritis Adenomatous Colon Polyps Diverticulosis Hyperlipidemia  Past Surgical History: Reviewed history from 03/18/2008 and no changes required.  breast bx Lumpectomy  Family History: Reviewed history from 05/26/2008 and no changes required. Family History Diabetes 1st degree relative Family History Hypertension Family History of Breast Cancer Maternal Aunts x2 Family History of Colon Cancer:Paternal Uncles x5 Family History of Heart Disease: Mother & Father  Social History: Reviewed history from 05/26/2008 and no changes required. Retired Married Patient has never smoked.  Alcohol Use - yes- rarely Illicit Drug Use - no  Review of  Systems  The patient denies anorexia, fever, weight loss, weight gain, vision loss, decreased hearing, hoarseness, chest pain, syncope, dyspnea on exertion, peripheral edema, prolonged cough, headaches, hemoptysis, abdominal pain, melena, hematochezia, severe indigestion/heartburn, hematuria, incontinence, genital sores, muscle weakness, suspicious skin lesions, transient blindness, difficulty walking, depression, unusual weight change, abnormal bleeding, enlarged lymph nodes, angioedema, and breast masses.    Physical Exam  General:  Well developed, well nourished, no acute distress. Head:  Normocephalic and atraumatic. Eyes:  pupils equal and pupils round.   Ears:  R ear normal and L ear normal.   Nose:  No deformity, discharge,  or lesions. Mouth:  No deformity or lesions,   Neck:  supple and full ROM.   Chest Wall:  No deformities, masses, or tenderness noted. Breasts:  skin/areolae normal and no masses.   Lungs:  Clear throughout to auscultation. Heart:  Regular rate and rhythm; no murmurs, rubs,  or bruits. Abdomen:  soft, non-tender, and RLQ tenderness.    Diabetes Management Exam:    Foot Exam (with socks and/or shoes not present):       Sensory-Pinprick/Light touch:          Left medial foot (L-4): diminished  Left dorsal foot (L-5): diminished          Left lateral foot (S-1): diminished          Right medial foot (L-4): diminished          Right dorsal foot (L-5): diminished          Right lateral foot (S-1): diminished    Eye Exam:       Eye Exam done elsewhere          Date: 02/16/2009          Results: normal          Done by: eye center   Impression & Recommendations:  Problem # 1:  PREVENTIVE HEALTH CARE (ICD-V70.0) The pt was asked about all immunizations, health maint. services that are appropriate to their age and was given guidance on diet exercize  and weight management  Mammogram: normal (06/12/2008) Pap smear: normal (06/12/2008) Colonoscopy:  Location:  [facility].   (06/17/2008) Td Booster: Historical (03/17/2006)   Flu Vax: Historical (03/18/2009)   Pneumovax: Historical (03/17/2005) Chol: 197 (03/29/2009)   HDL: 45.80 (03/29/2009)   LDL: 126 (03/29/2009)   TG: 125.0 (03/29/2009) TSH: 1.30 (03/29/2009)   HgbA1C: 6.8 (03/29/2009)   Next mammogram due:: 06/2009 (04/05/2009) Next Colonoscopy due:: 06/2013 (06/17/2008)  Discussed using sunscreen, use of alcohol, drug use, self breast exam, routine dental care, routine eye care, schedule for GYN exam, routine physical exam, seat belts, multiple vitamins, osteoporosis prevention, adequate calcium intake in diet, recommendations for immunizations, mammograms and Pap smears.  Discussed exercise and checking cholesterol.  Discussed gun safety, safe sex, and contraception.  Problem # 2:  DIVERTICULITIS OF COLON (ICD-562.11) cipro and metronidazole for 10 days noted the elevation in the [facility] Colonoscopy:  Labs Reviewed: Hgb: 13.0 (03/29/2009)   Hct: 38.1 (03/29/2009)   WBC: 11.4 (03/29/2009)  Problem # 3:  BACK PAIN (ICD-724.5)  back pain is improved  Discussed use of moist heat or ice, modified activities, medications, and stretching/strengthening exercises. Back care instructions given. To be seen in 2 weeks if no improvement; sooner if worsening of symptoms.   Complete Medication List: 1)  Actonel 150 Mg Tabs (Risedronate sodium) .... One by mouth monthly as directed 2)  Alprazolam 0.25 Mg Tbdp (Alprazolam) .... One by mouth three times a day  as needed 3)  Vitamin C 500 Mg Tabs (Ascorbic acid) .... As needed 4)  Chelated Zinc 23 Mg Lozg (Zinc) .... As needed 5)  Glucosamine 1500 Complex Caps (Glucosamine-chondroit-vit c-mn) .Marland Kitchen.. 1-3 once daily 6)  Chondroitin 1500  .... Once daily 7)  Glucovance 2.5-500 Mg Tabs (Glyburide-metformin) .... One by mouth bid 8)  Vitamin D 78295 Unit Caps (Ergocalciferol) .... One by mouth weekly 9)  Onetouch Ultra Test Strp (Glucose  blood) .... Test two times a day as directed 10)  Benicar 40 Mg Tabs (Olmesartan medoxomil) .... One by mouth dailly 11)  Lasix 20 Mg Tabs (Furosemide) .... One by mouth daily 12)  Nystatin 100000 Unit/ml Susp (Nystatin) .Marland Kitchen.. 10 cc gargle and swalloww three times a day prn 13)  One Touch Delica Lancets Misc (Lancets) .... Use as directed 14)  Ciprofloxacin Hcl 500 Mg Tabs (Ciprofloxacin hcl) .... One by mouth two times a day for 10 days 15)  Metronidazole 250 Mg Tabs (Metronidazole) .... One by mouth three times a day  for 10 days  Hypertension Assessment/Plan:      The patient's hypertensive risk group is category C: Target organ damage and/or  diabetes.  Her calculated 10 year risk of coronary heart disease is 20 %.  Today's blood pressure is 130/80.  Her blood pressure goal is < 130/80.  Patient Instructions: 1)  Please schedule a follow-up appointment in 1 months. Prescriptions: METRONIDAZOLE 250 MG TABS (METRONIDAZOLE) one by mouth three times a day  for 10 days  #30 x 1   Entered and Authorized by:   Stacie Glaze MD   Signed by:   Stacie Glaze MD on 04/05/2009   Method used:   Electronically to        [facility]  [phone]* (retail)       [address]       [place], [place]  [address]       Ph: [phone] or [phone]       Fax: [phone]   RxID:   [id number] CIPROFLOXACIN HCL 500 MG TABS (CIPROFLOXACIN HCL) one by mouth two times a day for 10 days  #20 x 1   Entered and Authorized by:   Stacie Glaze MD   Signed by:   Stacie Glaze MD on 04/05/2009   Method used:   Electronically to        [facility]  [phone]* (retail)       [address]       [place], [place]  [address]       Ph: [phone] or [phone]       Fax: [phone]   RxID:   [id number]    Immunization History:  Tetanus/Td Immunization History:    Tetanus/Td:  historical (03/17/2006)  Influenza Immunization  History:    Influenza:  historical (03/18/2009)  Pneumovax Immunization History:    Pneumovax:  historical (03/17/2005)    Preventive Care Screening  Mammogram:    Date:  06/12/2008    Next Due:  06/2009    Results:  normal   Pap Smear:    Date:  06/12/2008    Next Due:  06/2009    Results:  normal   Last Flu Shot:    Date:  03/18/2009    Results:  Historical   Last Tetanus Booster:    Date:  03/17/2006    Results:  Historical   Last Pneumovax:    Date:  03/17/2005    Results:  Historical

## 2010-05-19 NOTE — Progress Notes (Signed)
Summary: bilateral leg swelling  Phone Note Call from Patient   Summary of Call: Pt walks in office asking for Rx for bilateral leg  swelling......Marland KitchenMarland KitchenShe  is going out of town for a while.  Discussed pt's history with Dr. Clent Ridges and Lasix 20 mg. one by mouth daily sent to [facility] ([place]  Pt notified. Initial call taken by: Lynann Beaver CMA,  December 04, 2008 2:35 PM  Follow-up for Phone Call        agreed, try #30 with no rf. Follow-up by: Nelwyn Salisbury MD,  December 04, 2008 2:43 PM    New/Updated Medications: LASIX 20 MG TABS (FUROSEMIDE) one by mouth daily Prescriptions: LASIX 20 MG TABS (FUROSEMIDE) one by mouth daily  #30 x 0   Entered by:   Lynann Beaver CMA   Authorized by:   Nelwyn Salisbury MD   Signed by:   Lynann Beaver CMA on 12/04/2008   Method used:   Electronically to        [facility]  [phone]* (retail)       [address]       [place], [place]  [address]       Ph: [phone] or [phone]       Fax: [phone]   RxID:   [id number]

## 2010-05-19 NOTE — Progress Notes (Signed)
Summary: Severe Back Pain  Phone Note Call from Patient Call back at Home Phone [phone]   Caller: Patient Reason for Call: Acute Illness Summary of Call: Severe back pain, would like to be worked in.  Going out of town this weekend. Initial call taken by: Trixie Dredge,  July 19, 2009 9:43 AM  Follow-up for Phone Call        ov given for to morrow and pt informed Follow-up by: Willy Eddy, LPN,  July 19, 2009 10:45 AM

## 2010-05-19 NOTE — Assessment & Plan Note (Signed)
Summary: 2 month rov/njr   Vital Signs:  Patient Profile:   77 Years Old Female Height:     61 inches Weight:      151 pounds Temp:     98.2 degrees F oral Pulse rate:   80 / minute Resp:     14 per minute BP sitting:   140 / 80  (left arm)  Vitals Entered By: Willy Eddy, LPN (October 01, 2007 3:05 PM)                 Chief Complaint:  c/o rib pain from coughing.  History of Present Illness: increased cough with non production with hoarseness following thecough pain under ribs  Follow-Up Visit      This is a 77 year old woman who presents for Follow-up visit.  The patient complains of chest pain, but denies palpitations, dizziness, syncope, low blood sugar symptoms, high blood sugar symptoms, edema, SOB, DOE, PND, and orthopnea.  Since the last visit the patient notes problems with medications.  The patient reports monitoring BP, not monitoring blood sugars, and dietary compliance.  When questioned about possible medication side effects, the patient notes dry cough.      Current Allergies: No known allergies   Past Medical History:    Reviewed history from 12/03/2006 and no changes required:       Allergic rhinitis       Hypertension       Osteoporosis       Diabetes mellitus, type II       back pain       Osteoarthritis  Past Surgical History:    Reviewed history from 12/03/2006 and no changes required:       Denies surgical history  escept breast bx   Family History:    Reviewed history from 03/19/2007 and no changes required:       Family History Diabetes 1st degree relative       Family History Hypertension  Social History:    Reviewed history from 02/01/2007 and no changes required:       Retired       Married    Review of Systems  The patient denies anorexia, fever, weight loss, weight gain, vision loss, decreased hearing, hoarseness, chest pain, syncope, dyspnea on exertion, peripheral edema, prolonged cough, headaches, hemoptysis, abdominal  pain, melena, hematochezia, severe indigestion/heartburn, hematuria, incontinence, genital sores, muscle weakness, suspicious skin lesions, transient blindness, difficulty walking, depression, unusual weight change, abnormal bleeding, enlarged lymph nodes, angioedema, and breast masses.     Physical Exam  General:     Well-developed,well-nourished,in no acute distress; alert,appropriate and cooperative throughout examination Head:     Normocephalic and atraumatic without obvious abnormalities. No apparent alopecia or balding. Eyes:     No corneal or conjunctival inflammation noted. EOMI. Perrla. Funduscopic exam benign, without hemorrhages, exudates or papilledema. Vision grossly normal. Ears:     External ear exam shows no significant lesions or deformities.  Otoscopic examination reveals clear canals, tympanic membranes are intact bilaterally without bulging, retraction, inflammation or discharge. Hearing is grossly normal bilaterally. Nose:     mucosal erythema and mucosal edema.   Mouth:     posterior lymphoid hypertrophy.   Neck:     No deformities, masses, or tenderness noted. Lungs:     increased bronchial bs Heart:     Normal rate and regular rhythm. S1 and S2 normal without gallop, murmur, click, rub or other extra sounds. Abdomen:  Bowel sounds positive,abdomen soft and non-tender without masses, organomegaly or hernias noted. Msk:     decreased ROM, joint tenderness, and joint swelling.  knees bilaterally    Impression & Recommendations:  Problem # 1:  DERMATOPHYTOSIS OF NAIL (ICD-110.1)  Her updated medication list for this problem includes:    Lamisil 250 Mg Tabs (Terbinafine hcl) .Marland Kitchen... Take one tablet by mouth once a day for30 day Discussed nail care and medication treatment options.   Problem # 2:  COUGH VARIANT ASTHMA (ICD-493.82)  Problem # 3:  DIABETES MELLITUS, TYPE II (ICD-250.00)  Her updated medication list for this problem includes:    Amaryl 2 Mg  Tabs (Glimepiride) .Marland Kitchen... Take one (1) by mouth once a day    Cozaar 50 Mg Tabs (Losartan potassium) ..... One by mouth daily  Labs Reviewed: HgBA1c: 6.2 (07/25/2007)   Creat: 1.0 (04/25/2007)      Problem # 4:  HYPERTENSION (ICD-401.9)  Her updated medication list for this problem includes:    Cozaar 50 Mg Tabs (Losartan potassium) ..... One by mouth daily  BP today: 140/80 Prior BP: 160/80 (09/03/2007)  Prior 10 Yr Risk Heart Disease: 20 % (04/25/2007)  Labs Reviewed: Creat: 1.0 (04/25/2007) Chol: 193 (01/18/2007)   HDL: 47.0 (01/18/2007)   LDL: 122 (01/18/2007)   TG: 121 (01/18/2007)   Complete Medication List: 1)  Actonel 150 Mg Tabs (Risedronate sodium) .... One by mouth monthly as directed 2)  Xanax 0.25 Mg Tabs (Alprazolam) .... As needed 3)  Vitamin C 500 Mg Tabs (Ascorbic acid) .... As needed 4)  Chelated Zinc 23 Mg Lozg (Zinc) .... As needed 5)  Glucosamine 1500 Complex Caps (Glucosamine-chondroit-vit c-mn) .Marland Kitchen.. 1-3 once daily 6)  Chondroitin 1500  .... Once daily 7)  Amaryl 2 Mg Tabs (Glimepiride) .... Take one (1) by mouth once a day 8)  Cozaar 50 Mg Tabs (Losartan potassium) .... One by mouth daily 9)  Vitamin D 62130 Unit Caps (Ergocalciferol) .... One by mouth weekly 10)  Lamisil 250 Mg Tabs (Terbinafine hcl) .... Take one tablet by mouth once a day for30 day   Patient Instructions: 1)  Please schedule a follow-up appointment in 2 months. 2)  Hepatic Panel prior to visit, ICD-9:  95.20 3)  Lipid Panel prior to visit, ICD-9:272.4 4)  HbgA1C prior to visit, ICD-9:250.00   Prescriptions: LAMISIL 250 MG TABS (TERBINAFINE HCL) Take one tablet by mouth once a day for30 day  #30 x 3   Entered and Authorized by:   Stacie Glaze MD   Signed by:   Stacie Glaze MD on 10/01/2007   Method used:   Print then Give to Patient   RxID:   [id number]  ]

## 2010-05-19 NOTE — Assessment & Plan Note (Signed)
Summary: uri with fever   Vital Signs:  Patient Profile:   77 Years Old Female Height:     61 inches Temp:     100.4 degrees F oral Pulse rate:   92 / minute Resp:     16 per minute BP sitting:   160 / 80  (left arm)  Vitals Entered By: Willy Eddy, LPN (Sep 03, 2007 2:11 PM)                 Chief Complaint:  c/o chest congestion and cough with fever and myalgia.  History of Present Illness: fever and myalgias with severe HA, SOB, chills no one sick at home no close friends sick cough is non productive     Current Allergies: No known allergies   Past Medical History:    Reviewed history from 12/03/2006 and no changes required:       Allergic rhinitis       Hypertension       Osteoporosis       Diabetes mellitus, type II       back pain       Osteoarthritis  Past Surgical History:    Reviewed history from 12/03/2006 and no changes required:       Denies surgical history  escept breast bx   Family History:    Reviewed history from 03/19/2007 and no changes required:       Family History Diabetes 1st degree relative       Family History Hypertension  Social History:    Reviewed history from 02/01/2007 and no changes required:       Retired       Married    Review of Systems  The patient denies anorexia, fever, weight loss, weight gain, vision loss, decreased hearing, hoarseness, chest pain, syncope, dyspnea on exhertion, peripheral edema, prolonged cough, hemoptysis, abdominal pain, melena, hematochezia, severe indigestion/heartburn, hematuria, incontinence, genital sores, muscle weakness, suspicious skin lesions, transient blindness, difficulty walking, depression, unusual weight change, abnormal bleeding, enlarged lymph nodes, angioedema, and breast masses.     Physical Exam  General:     Well-developed,well-nourished,in no acute distress; alert,appropriate and cooperative throughout examination Ears:     External ear exam shows no  significant lesions or deformities.  Otoscopic examination reveals clear canals, tympanic membranes are intact bilaterally without bulging, retraction, inflammation or discharge. Hearing is grossly normal bilaterally. Nose:     mucosal erythema and mucosal edema.   Mouth:     posterior lymphoid hypertrophy.   Neck:     No deformities, masses, or tenderness noted. Lungs:     increased bronchial bs Heart:     Normal rate and regular rhythm. S1 and S2 normal without gallop, murmur, click, rub or other extra sounds.    Impression & Recommendations:  Problem # 1:  BRONCHITIS, ACUTE (ICD-466.0) drink plenty of fluids and get rest, moniter the sob Take antibiotics and other medications as directed. Encouraged to push clear liquids, get enough rest, and take acetaminophen as needed. To be seen in 5-7 days if no improvement, sooner if worse.  Her updated medication list for this problem includes:    Biaxin 500 Mg Tab (Clarithromycin) .Marland Kitchen... Take one (1) tablet by mouth two (2) times a day x 10 days    Phenflu Cdx 10-20-400-500 Mg Tabs (Phenylephrine-codeine-gg-apap) ..... One by mouth q 6 hour   Problem # 2:  INFLUENZA (ICD-487.8) negative screeing but symptoms are suggestive Orders: Flu A+B (87400) Rest, increase fluids,  use Tylenol [phone] mg every 4-6 hours, and avoid contact with others. call office if no improvement in 5-7 days or if symptoms worsen.   Complete Medication List: 1)  Actonel 150 Mg Tabs (Risedronate sodium) .... One by mouth monthly as directed 2)  Xanax 0.25 Mg Tabs (Alprazolam) .... As needed 3)  Vitamin C 500 Mg Tabs (Ascorbic acid) .... As needed 4)  Chelated Zinc 23 Mg Lozg (Zinc) .... As needed 5)  Glucosamine 1500 Complex Caps (Glucosamine-chondroit-vit c-mn) .Marland Kitchen.. 1-3 once daily 6)  Chondroitin 1500  .... Once daily 7)  Amaryl 2 Mg Tabs (Glimepiride) .... Take one (1) by mouth once a day 8)  Cozaar 50 Mg Tabs (Losartan potassium) .... One by mouth daily 9)   Vitamin D 62130 Unit Caps (Ergocalciferol) .... One by mouth weekly 10)  Biaxin 500 Mg Tab (Clarithromycin) .... Take one (1) tablet by mouth two (2) times a day x 10 days 11)  Phenflu Cdx 10-20-400-500 Mg Tabs (Phenylephrine-codeine-gg-apap) .... One by mouth q 6 hour    Prescriptions: BIAXIN 500 MG TAB (CLARITHROMYCIN) Take one (1) tablet by mouth two (2) times a day X 10 days  #20 x 0   Entered and Authorized by:   Stacie Glaze MD   Signed by:   Stacie Glaze MD on 09/03/2007   Method used:   Electronically sent to ...       [facility]  [address]  [phone]*       [address]       [place], [place]  [address]       Ph: [phone] or [phone]       Fax: [phone]   RxID:   [id number]  ] Laboratory Results    Other Tests  Influenza: negative Comments Patricia Moore  Sep 03, 2007 2:28 PM

## 2010-05-19 NOTE — Assessment & Plan Note (Signed)
Summary: 3 month fup//ccm   Vital Signs:  Patient profile:   77 year old female Height:      61 inches Weight:      150 pounds BMI:     28.44 Temp:     98.2 degrees F oral Pulse rate:   72 / minute Resp:     14 per minute BP sitting:   130 / 80  (left arm)  Vitals Entered By: Willy Eddy, LPN (February 01, 2010 9:42 AM)  Nutrition Counseling: Patient's BMI is greater than 25 and therefore counseled on weight management options. CC: roa, Type 2 diabetes mellitus follow-up, Hypertension Management Is Patient Diabetic? Yes Did you bring your meter with you today? No   Primary Care Provider:  Stacie Glaze MD  CC:  roa, Type 2 diabetes mellitus follow-up, and Hypertension Management.  History of Present Illness:  Type 2 Diabetes Mellitus Follow-Up      This is a 77 year old woman who presents for Type 2 diabetes mellitus follow-up.  the pts states CBGs in the 119-121 range weight is down eating better.  The patient denies polyuria, polydipsia, blurred vision, self managed hypoglycemia, hypoglycemia requiring help, weight loss, weight gain, and numbness of extremities.  The patient denies the following symptoms: neuropathic pain, chest pain, vomiting, orthostatic symptoms, poor wound healing, intermittent claudication, vision loss, and foot ulcer.  Since the last visit the patient reports good dietary compliance.  The patient has been measuring capillary blood glucose before breakfast and before dinner.  Since the last visit, the patient reports having had eye care by an ophthalmologist.   she denies any feet issues and does regular self exams  Hypertension History:      Positive major cardiovascular risk factors include female age 32 years old or older, diabetes, hyperlipidemia, and hypertension.  Negative major cardiovascular risk factors include negative family history for ischemic heart disease and non-tobacco-user status.     Preventive Screening-Counseling &  Management  Alcohol-Tobacco     Alcohol drinks/day: 1     Smoking Status: never     Passive Smoke Exposure: no  Problems Prior to Update: 1)  Bartholin's Cyst, Recurrent  (ICD-616.2) 2)  Cellulitis, Neck  (ICD-682.1) 3)  Hyperlipidemia, Mild  (ICD-272.4) 4)  Cellulitis and Abscess of Unspecified Site  (ICD-682.9) 5)  Carcinoma, Skin, Squamous Cell, Face  (ICD-173.3) 6)  Diverticulitis of Colon  (ICD-562.11) 7)  Varicose Veins Lower Extremities W/oth Comps  (ICD-454.8) 8)  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) 9)  Edema  (ICD-782.3) 10)  Chest Pain, Atypical  (ICD-786.59) 11)  Rectal Bleeding  (ICD-569.3) 12)  Angiodysplasia-intestine  (ICD-569.84) 13)  Family Hx Colon Cancer  (ICD-V16.0) 14)  Flatulence-gas-bloating  (ICD-787.3) 15)  Diverticulosis, Colon  (ICD-562.10) 16)  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) 17)  Cough Variant Asthma  (ICD-493.82) 18)  Dermatophytosis of Nail  (ICD-110.1) 19)  Bronchitis, Acute  (ICD-466.0) 20)  Influenza  (ICD-487.8) 21)  Family History Diabetes 1st Degree Relative  (ICD-V18.0) 22)  Preventive Health Care  (ICD-V70.0) 23)  Osteoarthrosis, Local Nos, Lower Leg  (ICD-715.36) 24)  Osteoarthritis  (ICD-715.90) 25)  Back Pain  (ICD-724.5) 26)  Diabetes Mellitus, Type II  (ICD-250.00) 27)  Osteoporosis  (ICD-733.00) 28)  Hypertension  (ICD-401.9) 29)  Allergic Rhinitis  (ICD-477.9)  Current Problems (verified): 1)  Bartholin's Cyst, Recurrent  (ICD-616.2) 2)  Cellulitis, Neck  (ICD-682.1) 3)  Hyperlipidemia, Mild  (ICD-272.4) 4)  Cellulitis and Abscess of Unspecified Site  (ICD-682.9) 5)  Carcinoma, Skin, Squamous Cell, Face  (ICD-173.3) 6)  Diverticulitis of Colon  (ICD-562.11) 7)  Varicose Veins Lower Extremities W/oth Comps  (ICD-454.8) 8)  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) 9)  Edema  (ICD-782.3) 10)  Chest Pain, Atypical  (ICD-786.59) 11)  Rectal Bleeding  (ICD-569.3) 12)  Angiodysplasia-intestine   (ICD-569.84) 13)  Family Hx Colon Cancer  (ICD-V16.0) 14)  Flatulence-gas-bloating  (ICD-787.3) 15)  Diverticulosis, Colon  (ICD-562.10) 16)  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 One By Mouth Three Times A Day  As Needed 2)  Vitamin C 500 Mg  Tabs (Ascorbic Acid) .... As Needed 3)  Chelated Zinc 23 Mg  Lozg (Zinc) .... As Needed 4)  Glucovance 2.5-500 Mg Tabs (Glyburide-Metformin) .... One By Mouth Bid 5)  Vitamin D 16109 Unit  Caps (Ergocalciferol) .... One By Mouth Weekly 6)  Onetouch Ultra Test  Strp (Glucose Blood) .... Test Two Times A Day As Directed 7)  Benicar 40 Mg Tabs (Olmesartan Medoxomil) .... One By Mouth Dailly 8)  Lasix 20 Mg Tabs (Furosemide) .... One By Mouth Daily 9)  Nystatin 100000 Unit/ml Susp (Nystatin) .Marland Kitchen.. 10 Cc Gargle and Swalloww Three Times A Day Prn 10)  Onetouch Ultrasoft Lancets  Misc (Lancets) .... Check Blood Sugars As Directed 11)  Sulfamethoxazole-Tmp Ds 800-160 Mg Tabs (Sulfamethoxazole-Trimethoprim) .... One By Mouth Two Times A Day For 14 Days  Current Medications (verified): 1)  Alprazolam 0.25 Mg Tbdp (Alprazolam) .... One By Mouth Three Times A Day  As Needed 2)  Vitamin C 500 Mg  Tabs (Ascorbic Acid) .... As Needed 3)  Chelated Zinc 23 Mg  Lozg (Zinc) .... As Needed 4)  Glucovance 2.5-500 Mg Tabs (Glyburide-Metformin) .... One By Mouth Bid 5)  Vitamin D 60454  Unit  Caps (Ergocalciferol) .... One By Mouth Weekly 6)  Onetouch Ultra Test  Strp (Glucose Blood) .... Test Two Times A Day As Directed 7)  Benicar 40 Mg Tabs (Olmesartan Medoxomil) .... One By Mouth Dailly 8)  Lasix 20 Mg Tabs (Furosemide) .... One By Mouth Daily 9)  Onetouch Ultrasoft Lancets  Misc (Lancets) .... Check Blood Sugars As Directed  Allergies (verified): No Known Drug Allergies  Past History:  Family History: Last updated: 05/26/2008 Family History Diabetes 1st degree relative Family History Hypertension Family History of Breast Cancer Maternal Aunts x2 Family History of Colon Cancer:Paternal Uncles x5 Family History of Heart Disease: Mother & Father  Social History: Last updated: 05/26/2008 Retired Married Patient has never smoked.  Alcohol Use - yes- rarely Illicit Drug Use - no  Risk Factors: Alcohol Use: 1 (02/01/2010) Exercise: yes (02/01/2007)  Risk Factors: Smoking Status: never (02/01/2010) Passive Smoke Exposure: no (02/01/2010)  Past medical, surgical, family and social histories (including risk factors) reviewed, and no changes noted (except as noted below).  Past Medical History: Reviewed history from 05/26/2008 and no changes required. Allergic rhinitis Hypertension Osteoporosis Diabetes mellitus, type II back pain Osteoarthritis  Adenomatous Colon Polyps Diverticulosis Hyperlipidemia  Past Surgical History: Reviewed history from 03/18/2008 and no changes required.  breast bx Lumpectomy  Family History: Reviewed history from 05/26/2008 and no changes required. Family History Diabetes 1st degree relative Family History Hypertension Family History of Breast Cancer Maternal Aunts x2 Family History of Colon Cancer:Paternal Uncles x5 Family History of Heart Disease: Mother & Father  Social History: Reviewed history from 05/26/2008 and no changes required. Retired Married Patient has never smoked.  Alcohol Use - yes-  rarely Illicit Drug Use - no  Review of Systems  The patient denies anorexia, fever, weight loss, weight gain, vision loss, decreased hearing, hoarseness, chest pain, syncope, dyspnea on exertion, peripheral edema, prolonged cough, headaches, hemoptysis, abdominal pain, melena, hematochezia, severe indigestion/heartburn, hematuria, incontinence, genital sores, muscle weakness, suspicious skin lesions, transient blindness, difficulty walking, depression, unusual weight change, abnormal bleeding, enlarged lymph nodes, angioedema, and breast masses.    Physical Exam  General:  Well-developed,well-nourished,in no acute distress; alert,appropriate and cooperative throughout examination Head:  Normocephalic and atraumatic without obvious abnormalities. No apparent alopecia or balding. Eyes:  pupils equal and pupils round.   Ears:  R ear normal and L ear normal.   Nose:  no external deformity and no nasal discharge.   Mouth:  Oral mucosa and oropharynx without lesions or exudates.  Teeth in good repair. Neck:  cervical lymphadenopathy.   Lungs:  Normal respiratory effort, chest expands symmetrically. Lungs are clear to auscultation, no crackles or wheezes. Heart:  Normal rate and regular rhythm. S1 and S2 normal without gallop, murmur, click, rub or other extra sounds. Abdomen:  Bowel sounds positive,abdomen soft and non-tender without masses, organomegaly or hernias noted. Msk:  No deformity or scoliosis noted of thoracic or lumbar spine.   Extremities:  No clubbing, cyanosis, edema, or deformity noted with normal full range of motion of all joints.   Neurologic:  alert & oriented X3 and finger-to-nose normal.    Diabetes Management Exam:    Foot Exam (with socks and/or shoes not present):       Sensory-Pinprick/Light touch:          Left medial foot (L-4): normal          Left dorsal foot (L-5): normal          Left lateral foot (S-1): normal          Right medial foot (L-4): normal           Right dorsal foot (L-5): normal          Right lateral foot (S-1): normal       Sensory-Monofilament:          Left foot: normal          Right foot: normal       Inspection:          Left foot: normal          Right foot: normal       Nails:          Left foot: normal          Right foot: normal    Eye Exam:       Eye Exam done elsewhere          Date: 03/17/2010          Results: normal          Done by: rankin   Impression & Recommendations:  Problem # 1:  VARICOSE VEINS LOWER EXTREMITIES W/OTH COMPS (  ICD-454.8) wearing hose  Problem # 2:  OSTEOARTHRITIS (ICD-715.90)  better walking   Discussed use of medications, application of heat or cold, and exercises.   Problem # 3:  DIABETES MELLITUS, TYPE II (ICD-250.00)  Her updated medication list for this problem includes:    Glucovance 2.5-500 Mg Tabs (Glyburide-metformin) ..... One by mouth bid    Benicar 40 Mg Tabs (Olmesartan medoxomil) ..... One by mouth dailly  Labs Reviewed: Creat: 0.9 (11/02/2009)     Last Eye Exam: normal (03/17/2010) Reviewed HgBA1c results: 6.4 (11/02/2009)  6.3 (07/28/2009)  Orders: Venipuncture (04540) TLB-BMP (Basic Metabolic Panel-BMET) (80048-METABOL) TLB-A1C / Hgb A1C (Glycohemoglobin) (83036-A1C)  Problem # 4:  HYPERTENSION (ICD-401.9)  Her updated medication list for this problem includes:    Benicar 40 Mg Tabs (Olmesartan medoxomil) ..... One by mouth dailly    Lasix 20 Mg Tabs (Furosemide) ..... One by mouth daily  BP today: 130/80 Prior BP: 130/80 (12/13/2009)  Prior 10 Yr Risk Heart Disease: 13 % (11/02/2009)  Labs Reviewed: K+: 5.0 (11/02/2009) Creat: : 0.9 (11/02/2009)   Chol: 204 (07/28/2009)   HDL: 62.40 (07/28/2009)   LDL: 126 (03/29/2009)   TG: 116.0 (07/28/2009)  Complete Medication List: 1)  Alprazolam 0.25 Mg Tbdp (Alprazolam) .... One by mouth three times a day  as needed 2)  Vitamin C 500 Mg Tabs (Ascorbic acid) .... As needed 3)  Chelated Zinc 23 Mg Lozg  (Zinc) .... As needed 4)  Glucovance 2.5-500 Mg Tabs (Glyburide-metformin) .... One by mouth bid 5)  Vitamin D 98119 Unit Caps (Ergocalciferol) .... One by mouth weekly 6)  Onetouch Ultra Test Strp (Glucose blood) .... Test two times a day as directed 7)  Benicar 40 Mg Tabs (Olmesartan medoxomil) .... One by mouth dailly 8)  Lasix 20 Mg Tabs (Furosemide) .... One by mouth daily 9)  Onetouch Ultrasoft Lancets Misc (Lancets) .... Check blood sugars as directed  Hypertension Assessment/Plan:      The patient's hypertensive risk group is category C: Target organ damage and/or diabetes.  Her calculated 10 year risk of coronary heart disease is 13 %.  Today's blood pressure is 130/80.  Her blood pressure goal is < 130/80.  Patient Instructions: 1)  Please schedule a follow-up appointment in 3 months.   Orders Added: 1)  Est. Patient Level IV [14782] 2)  Venipuncture [36415] 3)  TLB-BMP (Basic Metabolic Panel-BMET) [80048-METABOL] 4)  TLB-A1C / Hgb A1C (Glycohemoglobin) [83036-A1C]  Appended Document: Orders Update    Clinical Lists Changes  Orders: Added new Service order of Specimen Handling ([id number]) - Signed

## 2010-05-19 NOTE — Letter (Signed)
Summary: [facility]   Imported By: Maryln Gottron 05/15/2008 12:26:35  _____________________________________________________________________  External Attachment:    Type:   Image     Comment:   External Document

## 2010-05-19 NOTE — Assessment & Plan Note (Signed)
Summary: 3 month f/u//ca   Vital Signs:  Patient Profile:   77 Years Old Female Height:     61 inches Weight:      148 pounds Temp:     98.2 degrees F oral Pulse rate:   76 / minute Resp:     14 per minute BP sitting:   140 / 70  (left arm)  Vitals Entered By: Willy Eddy, LPN (July 24, 1608 9:12 AM)                 Chief Complaint:  roa--fasting this am.  History of Present Illness: Knee pain is good, DM with much better control walking 4-5 miles every day Back better     Current Allergies: No known allergies   Past Medical History:    Reviewed history from 12/03/2006 and no changes required:       Allergic rhinitis       Hypertension       Osteoporosis       Diabetes mellitus, type II       back pain       Osteoarthritis  Past Surgical History:    Reviewed history from 12/03/2006 and no changes required:       Denies surgical history  escept breast bx   Family History:    Reviewed history from 03/19/2007 and no changes required:       Family History Diabetes 1st degree relative       Family History Hypertension  Social History:    Reviewed history from 02/01/2007 and no changes required:       Retired       Married    Review of Systems  The patient denies anorexia, fever, weight loss, weight gain, vision loss, decreased hearing, hoarseness, chest pain, syncope, dyspnea on exhertion, peripheral edema, prolonged cough, hemoptysis, abdominal pain, melena, hematochezia, severe indigestion/heartburn, hematuria, incontinence, genital sores, muscle weakness, suspicious skin lesions, transient blindness, difficulty walking, depression, unusual weight change, abnormal bleeding, enlarged lymph nodes, angioedema, and breast masses.     Physical Exam  General:     Well-developed,well-nourished,in no acute distress; alert,appropriate and cooperative throughout examination Head:     Normocephalic and atraumatic without obvious abnormalities. No  apparent alopecia or balding. Eyes:     No corneal or conjunctival inflammation noted. EOMI. Perrla. Funduscopic exam benign, without hemorrhages, exudates or papilledema. Vision grossly normal. Ears:     External ear exam shows no significant lesions or deformities.  Otoscopic examination reveals clear canals, tympanic membranes are intact bilaterally without bulging, retraction, inflammation or discharge. Hearing is grossly normal bilaterally. Mouth:     Oral mucosa and oropharynx without lesions or exudates.  Teeth in good repair. tongue geographic Neck:     No deformities, masses, or tenderness noted. Lungs:     Normal respiratory effort, chest expands symmetrically. Lungs are clear to auscultation, no crackles or wheezes. Heart:     Normal rate and regular rhythm. S1 and S2 normal without gallop, murmur, click, rub or other extra sounds. Abdomen:     Bowel sounds positive,abdomen soft and non-tender without masses, organomegaly or hernias noted.    Impression & Recommendations:  Problem # 1:  DIABETES MELLITUS, TYPE II (ICD-250.00) Assessment: Unchanged  Her updated medication list for this problem includes:    Amaryl 2 Mg Tabs (Glimepiride) .Marland Kitchen... Take one (1) by mouth once a day    Cozaar 50 Mg Tabs (Losartan potassium) ..... One by mouth daily  Orders:  TLB-A1C / Hgb A1C (Glycohemoglobin) (83036-A1C)  Labs Reviewed: HgBA1c: 6.8 (04/25/2007)   Creat: 1.0 (04/25/2007)      Problem # 2:  OSTEOPOROSIS (ICD-733.00)  Her updated medication list for this problem includes:    Actonel 150 Mg Tabs (Risedronate sodium) ..... One by mouth monthly as directed    Vitamin D 16109 Unit Caps (Ergocalciferol) ..... One by mouth weekly  Orders: T-Vitamin D (25-Hydroxy) [phone])   Problem # 3:  HYPERTENSION (ICD-401.9)  Her updated medication list for this problem includes:    Cozaar 50 Mg Tabs (Losartan potassium) ..... One by mouth daily  BP today: 140/70 Prior BP: 138/76  (04/25/2007)  Prior 10 Yr Risk Heart Disease: 20 % (04/25/2007)  Labs Reviewed: Creat: 1.0 (04/25/2007) Chol: 193 (01/18/2007)   HDL: 47.0 (01/18/2007)   LDL: 122 (01/18/2007)   TG: 121 (01/18/2007)   Problem # 4:  ALLERGIC RHINITIS (ICD-477.9) Discussed use of allergy medications and environmental measures.   Complete Medication List: 1)  Actonel 150 Mg Tabs (Risedronate sodium) .... One by mouth monthly as directed 2)  Xanax 0.25 Mg Tabs (Alprazolam) .... As needed 3)  Vitamin C 500 Mg Tabs (Ascorbic acid) .... As needed 4)  Chelated Zinc 23 Mg Lozg (Zinc) .... As needed 5)  Glucosamine 1500 Complex Caps (Glucosamine-chondroit-vit c-mn) .Marland Kitchen.. 1-3 once daily 6)  Chondroitin 1500  .... Once daily 7)  Amaryl 2 Mg Tabs (Glimepiride) .... Take one (1) by mouth once a day 8)  Cozaar 50 Mg Tabs (Losartan potassium) .... One by mouth daily 9)  Vitamin D 91478 Unit Caps (Ergocalciferol) .... One by mouth weekly   Patient Instructions: 1)  Please schedule a follow-up appointment in 2 months.    Prescriptions: VITAMIN D 29562 UNIT  CAPS (ERGOCALCIFEROL) one by mouth weekly  #5 x 5   Entered and Authorized by:   Stacie Glaze MD   Signed by:   Stacie Glaze MD on 07/25/2007   Method used:   Electronically sent to ...       [facility]  [address]  [phone]*       [address]       [place], [place]  [address]       Ph: [phone] or [phone]       Fax: [phone]   RxID:   [id number]  ]  Appended Document: Orders Update    Clinical Lists Changes  Orders: Added new Service order of Venipuncture [phone]) - Signed Added new Test order of T-Vitamin D (25-Hydroxy) ([id number]) - Signed Added new Test order of TLB-A1C / Hgb A1C (Glycohemoglobin) (83036-A1C) - Signed

## 2010-05-19 NOTE — Letter (Signed)
Summary: Handout Printed  Printed Handout:  - *[facility] Patient Instructions 

## 2010-05-19 NOTE — Assessment & Plan Note (Signed)
Summary: SWOLLEN LEG/MHF   Vital Signs:  Patient profile:   77 year old female Height:      61 inches Weight:      156 pounds BMI:     29.58 Temp:     98.2 degrees F oral Pulse rate:   72 / minute Resp:     14 per minute BP sitting:   120 / 78  (left arm)  Vitals Entered By: Willy Eddy, LPN (November 02, 2008 4:15 PM)  Primary Care Provider:  Stacie Glaze MD   History of Present Illness: intermitant swelling of face  and legs cannot related to food cannot relate to arrythmias no current ,medicaitosn to cause this and no hcx of remal dz of CHF  Problems Prior to Update: 1)  Chest Pain, Atypical  (ICD-786.59) 2)  Rectal Bleeding  (ICD-569.3) 3)  Angiodysplasia-intestine  (ICD-569.84) 4)  Family Hx Colon Cancer  (ICD-V16.0) 5)  Flatulence-gas-bloating  (ICD-787.3) 6)  Diverticulosis, Colon  (ICD-562.10) 7)  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 One By Mouth Monthly As Directed 2)  Alprazolam 0.25 Mg Tbdp (Alprazolam) .... One By Mouth Three Times A Day  As Needed 3)  Vitamin C 500 Mg  Tabs (Ascorbic Acid) .... As Needed 4)  Chelated Zinc 23 Mg  Lozg (Zinc) .... As Needed 5)  Glucosamine 1500 Complex   Caps (Glucosamine-Chondroit-Vit C-Mn) .Marland Kitchen.. 1-3 Once Daily 6)  Chondroitin 1500 .... Once Daily 7)  Amaryl 2 Mg Tabs (Glimepiride) .... Take One (1) By Mouth Once A Day 8)  Vitamin  D 50000 Unit  Caps (Ergocalciferol) .... One By Mouth Weekly 9)  Onetouch Ultra Test  Strp (Glucose Blood) .... Test Two Times A Day As Directed 10)  Benicar Hct 40-12.5 Mg Tabs (Olmesartan Medoxomil-Hctz) .... One By Mouth Daily  Current Medications (verified): 1)  Actonel 150 Mg  Tabs (Risedronate Sodium) .... One By Mouth Monthly As Directed 2)  Alprazolam 0.25 Mg Tbdp (Alprazolam) .... One By Mouth Three Times A Day  As Needed 3)  Vitamin C 500 Mg  Tabs (Ascorbic Acid) .... As Needed 4)  Chelated Zinc 23 Mg  Lozg (Zinc) .... As Needed 5)  Glucosamine 1500 Complex   Caps (Glucosamine-Chondroit-Vit C-Mn) .Marland Kitchen.. 1-3 Once Daily 6)  Chondroitin 1500 .... Once Daily 7)  Amaryl 2 Mg Tabs (Glimepiride) .... Take One (1) By Mouth Once A Day 8)  Vitamin D 16109 Unit  Caps (Ergocalciferol) .... One By Mouth Weekly 9)  Onetouch Ultra Test  Strp (Glucose Blood) .... Test Two Times A Day As Directed 10)  Benicar Hct 40-12.5 Mg Tabs (Olmesartan Medoxomil-Hctz) .... One By Mouth Daily  Allergies (verified): No Known Drug Allergies  Past History:  Family History: Last updated: 05/26/2008 Family History Diabetes 1st degree relative Family History Hypertension Family History of Breast Cancer Maternal Aunts x2 Family History of Colon Cancer:Paternal Uncles x5 Family History of Heart  Disease: Mother & Father  Social History: Last updated: 05/26/2008 Retired Married Patient has never smoked.  Alcohol Use - yes- rarely Illicit Drug Use - no  Risk Factors: Alcohol Use: 1 (03/19/2007) Exercise: yes (02/01/2007)  Risk Factors: Smoking Status: never (05/26/2008) Passive Smoke Exposure: no (03/19/2007)  Past medical, surgical, family and social histories (including risk factors) reviewed, and no changes noted (except as noted below).  Past Medical History: Reviewed history from 05/26/2008 and no changes required. Allergic rhinitis Hypertension Osteoporosis Diabetes mellitus, type II back  pain Osteoarthritis Adenomatous Colon Polyps Diverticulosis Hyperlipidemia  Past Surgical History: Reviewed history from 03/18/2008 and no changes required.  breast bx Lumpectomy  Family History: Reviewed history from 05/26/2008 and no changes required. Family History Diabetes 1st degree relative Family History Hypertension Family History of Breast Cancer Maternal Aunts x2 Family History of Colon Cancer:Paternal Uncles x5 Family History of Heart Disease: Mother & Father  Social History: Reviewed history from 05/26/2008 and no changes required. Retired Married Patient has never smoked.  Alcohol Use - yes- rarely Illicit Drug Use - no  Review of Systems       The patient complains of peripheral edema.  The patient denies anorexia, fever, weight loss, weight gain, vision loss, decreased hearing, hoarseness, chest pain, syncope, dyspnea on exertion, prolonged cough, headaches, hemoptysis, abdominal pain, melena, hematochezia, severe indigestion/heartburn, hematuria, incontinence, genital sores, muscle weakness, suspicious skin lesions, transient blindness, difficulty walking, depression, unusual weight change, abnormal bleeding, enlarged lymph nodes, angioedema, breast masses, and testicular masses.    Physical Exam  General:  Well developed, well nourished, no acute distress. Head:  Normocephalic and atraumatic. Eyes:  pupils equal and pupils round.   Ears:  R ear normal and L ear normal.   Nose:  No deformity, discharge,  or lesions. Mouth:  No deformity or lesions,   Neck:  supple and full ROM.   Lungs:  Clear throughout to auscultation. Heart:  Regular rate and rhythm; no murmurs, rubs,  or bruits. Abdomen:  Soft, nontender and nondistended. No masses, hepatosplenomegaly or hernias noted. Normal bowel sounds. Extremities:  trace left pedal edema and trace right pedal edema.   Neurologic:  alert & oriented X3 and finger-to-nose normal.     Impression &  Recommendations:  Problem # 1:  UNSPECIFIED HYPERSENSITIVITY ANGIITIS (ICD-446.20) Assessment New obtain allergy panels ( serum ) and consider referr to allergist  Problem # 2:  EDEMA (ICD-782.3) Assessment: New echo and stress echo normal doubt cardiogenic although arrythmia cannot be ruled out, she does not report palpitatons Her updated medication list for this problem includes:    Benicar Hct 40-12.5 Mg Tabs (Olmesartan medoxomil-hctz) ..... One by mouth daily  Complete Medication List: 1)  Actonel 150 Mg Tabs (Risedronate sodium) .... One by mouth monthly as directed 2)  Alprazolam 0.25 Mg Tbdp (Alprazolam) .... One by mouth three times a day  as needed 3)  Vitamin C 500 Mg Tabs (Ascorbic acid) .... As needed 4)  Chelated Zinc 23 Mg Lozg (Zinc) .... As needed 5)  Glucosamine 1500 Complex Caps (Glucosamine-chondroit-vit c-mn) .Marland Kitchen.. 1-3 once daily 6)  Chondroitin 1500  .... Once daily 7)  Amaryl 2 Mg Tabs (Glimepiride) .... Take one (1) by mouth once a day 8)  Vitamin D 16109 Unit Caps (Ergocalciferol) .... One by mouth weekly 9)  Onetouch Ultra Test Strp (Glucose blood) .... Test two times a day as directed 10)  Benicar Hct 40-12.5 Mg Tabs (Olmesartan medoxomil-hctz) .... One by mouth daily  Other  Orders: Venipuncture (52841) TLB-CBC Platelet - w/Differential (85025-CBCD) TLB-BMP (Basic Metabolic Panel-BMET) (80048-METABOL) T-Hypersens Panel (32440-10272) TLB-A1C / Hgb A1C (Glycohemoglobin) (83036-A1C) TLB-Microalbumin/Creat Ratio, Urine (82043-MALB)

## 2010-05-19 NOTE — Assessment & Plan Note (Signed)
Summary: fup per dr//ccm   Vital Signs:  Patient profile:   77 year old female Height:      61 inches Weight:      154 pounds BMI:     29.20 Temp:     98.2 degrees F oral Pulse rate:   80 / minute Resp:     14 per minute BP sitting:   122 / 70  (left arm)  Vitals Entered By: Willy Eddy, LPN (October 07, 2008 1:34 PM)  Primary Care [facility]:  Stacie Glaze MD  CC:  roa- c/o feet swelling at times.  History of Present Illness: slight sweeling of the feel ad pain in the shins the sweeling is off and on  mild SOB with walking no chest pain   Hypertension Follow-Up      This is a 77 year old woman who presents for Hypertension follow-up.  The patient reports edema.  Associated symptoms include chest pressure.  Compliance with medications (by patient report) has been near 100%.  The patient reports that dietary compliance has been good.  The patient reports exercising daily.    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 One By Mouth Monthly As Directed 2)  Alprazolam 0.25 Mg Tbdp (Alprazolam) .... One By Mouth Three Times A Day  As Needed 3)  Vitamin C 500 Mg  Tabs  (Ascorbic Acid) .... As Needed 4)  Chelated Zinc 23 Mg  Lozg (Zinc) .... As Needed 5)  Glucosamine 1500 Complex   Caps (Glucosamine-Chondroit-Vit C-Mn) .Marland Kitchen.. 1-3 Once Daily 6)  Chondroitin 1500 .... Once Daily 7)  Amaryl 2 Mg Tabs (Glimepiride) .... Take One (1) By Mouth Once A Day 8)  Vitamin D 46962 Unit  Caps (Ergocalciferol) .... One By Mouth Weekly 9)  Onetouch Ultra Test  Strp (Glucose Blood) .... Test Two Times A Day As Directed 10)  Benicar Hct 40-12.5 Mg Tabs (Olmesartan Medoxomil-Hctz) .... One By Mouth Daily  Current Medications (verified): 1)  Actonel 150 Mg  Tabs (Risedronate Sodium) .... One By Mouth Monthly As Directed 2)  Alprazolam 0.25 Mg Tbdp (Alprazolam) .... One By Mouth Three Times A Day  As Needed 3)  Vitamin C 500 Mg  Tabs (Ascorbic Acid) .... As Needed 4)  Chelated Zinc 23 Mg  Lozg (Zinc) .... As Needed 5)  Glucosamine 1500 Complex   Caps (Glucosamine-Chondroit-Vit C-Mn) .Marland Kitchen.. 1-3 Once Daily 6)  Chondroitin 1500 .... Once Daily 7)  Amaryl 2 Mg Tabs (Glimepiride) .... Take One (1) By Mouth Once A Day 8)  Vitamin D 95284 Unit  Caps (Ergocalciferol) .... One By Mouth Weekly 9)  Onetouch Ultra Test  Strp (Glucose Blood) .... Test Two Times A Day As Directed  10)  Benicar Hct 40-12.5 Mg Tabs (Olmesartan Medoxomil-Hctz) .... One By Mouth Daily  Allergies (verified): No Known Drug Allergies  Past History:  Family History: Last updated: 05/26/2008 Family History Diabetes 1st degree relative Family History Hypertension Family History of Breast Cancer Maternal Aunts x2 Family History of Colon Cancer:Paternal Uncles x5 Family History of Heart Disease: Mother & Father  Social History: Last updated: 05/26/2008 Retired Married Patient has never smoked.  Alcohol Use - yes- rarely Illicit Drug Use - no  Risk Factors: Alcohol Use: 1 (03/19/2007) Exercise: yes (02/01/2007)  Risk Factors: Smoking Status: never (05/26/2008) Passive Smoke Exposure: no  (03/19/2007)  Past medical, surgical, family and social histories (including risk factors) reviewed, and no changes noted (except as noted below).  Past Medical History: Reviewed history from 05/26/2008 and no changes required. Allergic rhinitis Hypertension Osteoporosis Diabetes mellitus, type II back pain Osteoarthritis Adenomatous Colon Polyps Diverticulosis Hyperlipidemia  Past Surgical History: Reviewed history from 03/18/2008 and no changes required.  breast bx Lumpectomy  Family History: Reviewed history from 05/26/2008 and no changes required. Family History Diabetes 1st degree relative Family History Hypertension Family History of Breast Cancer Maternal Aunts x2 Family History of Colon Cancer:Paternal Uncles x5 Family History of Heart Disease: Mother & Father  Social History: Reviewed history from 05/26/2008 and no changes required. Retired Married Patient has never smoked.  Alcohol Use - yes- rarely Illicit Drug Use - no  Review of Systems  The patient denies anorexia, fever, weight loss, weight gain, vision loss, decreased hearing, hoarseness, chest pain, syncope, dyspnea on exertion, peripheral edema, prolonged cough, headaches, hemoptysis, abdominal pain, melena, hematochezia, severe indigestion/heartburn, hematuria, incontinence, genital sores, muscle weakness, suspicious skin lesions, transient blindness, difficulty walking, depression, unusual weight change, abnormal bleeding, enlarged lymph nodes, angioedema, and breast masses.    Physical Exam  Head:  Normocephalic and atraumatic. Eyes:  pupils equal and pupils round.   Ears:  R ear normal and L ear normal.   Nose:  No deformity, discharge,  or lesions. Mouth:  No deformity or lesions,   Neck:  supple and full ROM.   Lungs:  Clear throughout to auscultation. Heart:  Regular rate and rhythm; no murmurs, rubs,  or bruits. Abdomen:  Soft, nontender and nondistended. No masses, hepatosplenomegaly or  hernias noted. Normal bowel sounds. Rectal:  deferred Msk:  Symmetrical with no gross deformities. Normal posture. Pulses:  R and L carotid,radial,femoral,dorsalis pedis and posterior tibial pulses are full and equal bilaterally Extremities:  trace left pedal edema and trace right pedal edema.     Impression & Recommendations:  Problem # 1:  HYPERTENSION (ICD-401.9) HAVING ATYPICAL CHEST PRESSURE  AND SOME MILE NEW SOB WITH EDEM THAT SHE RELATED TO BEGINING AFTER HER TRIP THIDS SPRING Her updated medication list for this problem includes:    Benicar Hct 40-12.5 Mg Tabs (Olmesartan medoxomil-hctz) ..... One by mouth daily  BP today: 122/70 Prior BP: 122/80 (08/20/2008)  Prior 10 Yr Risk Heart Disease: 13 % (05/20/2008)  Labs Reviewed: K+: 4.8 (05/20/2008) Creat: : 0.8 (05/20/2008)   Chol: 222 (03/11/2008)   HDL: 62.3 (03/11/2008)   LDL: DEL (03/11/2008)   TG: 87 (03/11/2008)  Problem # 2:  DIABETES MELLITUS, TYPE II (ICD-250.00)  Her updated medication list for this problem includes:    Amaryl 2 Mg Tabs (Glimepiride) .Marland Kitchen... Take one (1) by mouth once a day    Benicar Hct 40-12.5 Mg Tabs (Olmesartan medoxomil-hctz) ..... One by mouth daily  Labs Reviewed: Creat: 0.8 (05/20/2008)  Last Eye Exam: Results: Normal.  (03/20/2008) Reviewed HgBA1c results: 6.6 (05/20/2008)  6.7 (03/11/2008)  Problem # 3:  HYPERTENSION (ICD-401.9)  Her updated medication list for this problem includes:    Benicar Hct 40-12.5 Mg Tabs (Olmesartan medoxomil-hctz) ..... One by mouth daily  Complete Medication List: 1)  Actonel 150 Mg Tabs (Risedronate sodium) .... One by mouth monthly as directed 2)  Alprazolam 0.25 Mg Tbdp (Alprazolam) .... One by mouth three times a day  as needed 3)  Vitamin C 500 Mg Tabs (Ascorbic acid) .... As needed 4)  Chelated Zinc 23 Mg Lozg (Zinc) .... As needed 5)  Glucosamine 1500 Complex Caps (Glucosamine-chondroit-vit c-mn) .Marland Kitchen.. 1-3 once daily 6)  Chondroitin 1500   .... Once daily 7)  Amaryl 2 Mg Tabs (Glimepiride) .... Take one (1) by mouth once a day 8)  Vitamin D 81191 Unit Caps (Ergocalciferol) .... One by mouth weekly 9)  Onetouch Ultra Test Strp (Glucose blood) .... Test two times a day as directed 10)  Benicar Hct 40-12.5 Mg Tabs (Olmesartan medoxomil-hctz) .... One by mouth daily  Other Orders: Cardiology Referral (Cardiology)  Patient Instructions: 1)  Please schedule a follow-up appointment in 2 months. 2)  BACK STRETCHING TO TRY

## 2010-05-19 NOTE — Assessment & Plan Note (Signed)
Summary: back pain and rash/bmw   Vital Signs:  Patient profile:   77 year old female Height:      61 inches Weight:      154 pounds BMI:     29.20 Temp:     98.2 degrees F oral Pulse rate:   72 / minute Resp:     14 per minute BP sitting:   150 / 80  (left arm)  Vitals Entered By: Willy Eddy, LPN (July 21, 7251 3:49 PM) CC: c/o mid back pain, Hypertension Management   Primary Care Provider:  Stacie Glaze MD  CC:  c/o mid back pain and Hypertension Management.  History of Present Illness: rash on groin consistant with foliculitis and inflamed bx site that was reported to be benign her DM was been controlled she has mild back pain in the thoracic back wth a mild pleuritic component  she sees dermatologist the rash is red raised and itches  Hypertension History:      She denies headache, chest pain, palpitations, dyspnea with exertion, orthopnea, PND, peripheral edema, visual symptoms, neurologic problems, syncope, and side effects from treatment.        Positive major cardiovascular risk factors include female age 19 years old or older, diabetes, hyperlipidemia, and hypertension.  Negative major cardiovascular risk factors include negative family history for ischemic heart disease and non-tobacco-user status.     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) 6)  Edema  (ICD-782.3) 7)  Chest Pain, Atypical  (ICD-786.59) 8)  Rectal Bleeding  (ICD-569.3) 9)  Angiodysplasia-intestine  (ICD-569.84) 10)  Family Hx Colon Cancer  (ICD-V16.0) 11)  Flatulence-gas-bloating   (ICD-787.3) 12)  Diverticulosis, Colon  (ICD-562.10) 13)  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) 14)  Cough Variant Asthma  (ICD-493.82) 15)  Dermatophytosis of Nail  (ICD-110.1) 16)  Bronchitis, Acute  (ICD-466.0) 17)  Influenza  (ICD-487.8) 18)  Family History Diabetes 1st Degree Relative  (ICD-V18.0) 19)  Preventive Health Care  (ICD-V70.0) 20)  Osteoarthrosis, Local Nos, Lower Leg  (ICD-715.36) 21)  Osteoarthritis  (ICD-715.90) 22)  Back Pain  (ICD-724.5) 23)  Diabetes Mellitus, Type II  (ICD-250.00) 24)  Osteoporosis  (ICD-733.00) 25)  Hypertension  (ICD-401.9) 26)  Allergic Rhinitis  (ICD-477.9)  Current Problems (verified): 1)  Carcinoma, Skin, Squamous Cell, Face  (ICD-173.3) 2)  Diverticulitis of Colon  (ICD-562.11) 3)  Varicose Veins Lower Extremities W/oth Comps  (ICD-454.8) 4)  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) 5)  Edema  (ICD-782.3) 6)  Chest Pain, Atypical  (ICD-786.59) 7)  Rectal Bleeding  (ICD-569.3) 8)  Angiodysplasia-intestine  (ICD-569.84) 9)  Family Hx Colon Cancer  (ICD-V16.0) 10)  Flatulence-gas-bloating  (ICD-787.3) 11)  Diverticulosis, Colon  (ICD-562.10) 12)  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 One By Mouth Three Times A Day  As Needed 2)  Vitamin C 500 Mg  Tabs (Ascorbic Acid) .... As Needed 3)  Chelated Zinc 23 Mg  Lozg (Zinc) .... As  Needed 4)  Glucovance 2.5-500 Mg Tabs (Glyburide-Metformin) .... One By Mouth Bid 5)  Vitamin D 04540 Unit  Caps (Ergocalciferol) .... One By Mouth Weekly 6)  Onetouch Ultra Test  Strp (Glucose Blood) .... Test Two Times A Day As Directed 7)  Benicar 40 Mg Tabs (Olmesartan Medoxomil) .... One By Mouth Dailly 8)  Lasix 20 Mg Tabs (Furosemide) .... One By Mouth Daily 9)  Nystatin 100000 Unit/ml Susp (Nystatin) .Marland Kitchen.. 10 Cc Gargle and Swalloww Three Times A Day Prn 10)  One Touch Delica Lancets  Misc (Lancets) .... Use As Directed  Current Medications (verified): 1)  Alprazolam 0.25 Mg Tbdp (Alprazolam) .... One By Mouth Three Times A Day  As Needed 2)  Vitamin C 500 Mg  Tabs (Ascorbic Acid) .... As Needed 3)  Chelated Zinc 23 Mg  Lozg (Zinc) .... As Needed 4)  Glucovance 2.5-500 Mg Tabs (Glyburide-Metformin) .... One By Mouth Bid 5)  Vitamin D 98119 Unit  Caps (Ergocalciferol) .... One By Mouth Weekly 6)  Onetouch Ultra Test  Strp (Glucose Blood) .... Test Two Times A Day As Directed 7)  Benicar 40 Mg Tabs (Olmesartan Medoxomil) .... One By Mouth Dailly 8)  Lasix 20 Mg Tabs (Furosemide) .... One By Mouth Daily 9)  Nystatin 100000 Unit/ml Susp (Nystatin) .Marland Kitchen.. 10 Cc Gargle and Swalloww Three Times A Day Prn 10)  One Touch Delica Lancets  Misc (Lancets) .... Use As Directed 11)  Sulfamethoxazole-Tmp Ds 800-160 Mg Tabs (Sulfamethoxazole-Trimethoprim) .... One By Mouth Bid For 10 Days 12)  Doxycycline Hyclate 100 Mg Caps (Doxycycline Hyclate) .... One By Mouth Bid For 10 Days 13)  Mupirocin 2 % Oint (Mupirocin) .... Apply To Site Two Times A Day  Allergies (verified): No Known Drug Allergies  Review of Systems       The patient complains of suspicious skin lesions.  The patient denies anorexia, fever, weight loss, weight gain, vision loss, decreased hearing, hoarseness, chest pain, syncope, dyspnea on exertion, peripheral edema, prolonged cough, headaches, hemoptysis, abdominal pain, melena,  hematochezia, severe indigestion/heartburn, hematuria, incontinence, genital sores, muscle weakness, transient blindness, difficulty walking, depression, unusual weight change, abnormal bleeding, enlarged lymph nodes, angioedema, and breast masses.    Physical Exam  General:  Well developed, well nourished, no acute distress. Head:  Normocephalic and atraumatic. Eyes:  pupils equal and pupils round.   Neck:  supple and full ROM.   Chest Wall:  no mass, chest wall tenderness, and costochondrial tenderness.   Lungs:  Clear throughout to auscultation. Heart:  Regular rate and rhythm; no murmurs, rubs,  or bruits. Abdomen:  soft, non-tender, and RLQ tenderness.   Skin:  maculopapular rash.  bx site on pubic synthesis Axillary Nodes:  No palpable lymphadenopathy Inguinal Nodes:  No significant adenopathy   Impression & Recommendations:  Problem # 1:  CELLULITIS AND ABSCESS OF UNSPECIFIED SITE (ICD-682.9) Assessment New  Her updated medication list for this problem includes:    Sulfamethoxazole-tmp Ds 800-160 Mg Tabs (Sulfamethoxazole-trimethoprim) ..... One by mouth bid for 10 days    Doxycycline Hyclate 100 Mg Caps (  Doxycycline hyclate) ..... One by mouth bid for 10 days  Elevate affected area. Warm moist compresses for 20 minutes every 2 hours while awake. Take antibiotics as directed and take acetaminophen as needed. To be seen in 48-72 hours if no improvement, sooner if worse.  Problem # 2:  HYPERTENSION (ICD-401.9) Assessment: Deteriorated  Her updated medication list for this problem includes:    Benicar 40 Mg Tabs (Olmesartan medoxomil) ..... One by mouth dailly    Lasix 20 Mg Tabs (Furosemide) ..... One by mouth daily  Complete Medication List: 1)  Alprazolam 0.25 Mg Tbdp (Alprazolam) .... One by mouth three times a day  as needed 2)  Vitamin C 500 Mg Tabs (Ascorbic acid) .... As needed 3)  Chelated Zinc 23 Mg Lozg (Zinc) .... As needed 4)  Glucovance 2.5-500 Mg Tabs  (Glyburide-metformin) .... One by mouth bid 5)  Vitamin D 16109 Unit Caps (Ergocalciferol) .... One by mouth weekly 6)  Onetouch Ultra Test Strp (Glucose blood) .... Test two times a day as directed 7)  Benicar 40 Mg Tabs (Olmesartan medoxomil) .... One by mouth dailly 8)  Lasix 20 Mg Tabs (Furosemide) .... One by mouth daily 9)  Nystatin 100000 Unit/ml Susp (Nystatin) .Marland Kitchen.. 10 cc gargle and swalloww three times a day prn 10)  One Touch Delica Lancets Misc (Lancets) .... Use as directed 11)  Sulfamethoxazole-tmp Ds 800-160 Mg Tabs (Sulfamethoxazole-trimethoprim) .... One by mouth bid for 10 days 12)  Doxycycline Hyclate 100 Mg Caps (Doxycycline hyclate) .... One by mouth bid for 10 days 13)  Mupirocin 2 % Oint (Mupirocin) .... Apply to site two times a day  Other Orders: T-Ribs Unilateral 2 Views (71100TC)  Hypertension Assessment/Plan:      The patient's hypertensive risk group is category C: Target organ damage and/or diabetes.  Her calculated 10 year risk of coronary heart disease is 27 %.  Today's blood pressure is 150/80.  Her blood pressure goal is < 130/80.  Patient Instructions: 1)  Take your antibiotic as prescribed until ALL of it is gone, but stop if you develop a rash or swelling and contact our office as soon as possible. Prescriptions: MUPIROCIN 2 % OINT (MUPIROCIN) apply to site two times a day  #15 gm x 0   Entered and Authorized by:   Stacie Glaze MD   Signed by:   Stacie Glaze MD on 07/20/2009   Method used:   Electronically to        [facility]  [phone]* (retail)       [address]       [place], [place]  [address]       Ph: [phone] or [phone]       Fax: [phone]   RxID:   [id number] SULFAMETHOXAZOLE-TMP DS 800-160 MG TABS (SULFAMETHOXAZOLE-TRIMETHOPRIM) one by mouth BId for 10 days  #20 x 0   Entered and Authorized by:   Stacie Glaze MD   Signed by:   Stacie Glaze MD on 07/20/2009   Method used:    Electronically to        [facility]  [phone]* (retail)       [address]       [place], [place]  [address]       Ph: [phone] or [phone]       Fax: [phone]   RxID:   [id number]

## 2010-05-19 NOTE — Assessment & Plan Note (Signed)
Summary: cpx/mm   Vital Signs:  Patient Profile:   77 Years Old Female Height:     61 inches Weight:      155 pounds Temp:     98.2 degrees F oral Pulse rate:   80 / minute BP sitting:   118 / 70  (right arm) Cuff size:   regular  Vitals Entered By: Willy Eddy, LPN (March 18, 2008 2:20 PM)                 Chief Complaint:  cpx.  History of Present Illness: CPX    Prior Medications Reviewed Using: Patient Recall  Prior Medication List:  ACTONEL 150 MG  TABS (RISEDRONATE SODIUM) one by mouth monthly as directed XANAX 0.25 MG  TABS (ALPRAZOLAM) as needed VITAMIN C 500 MG  TABS (ASCORBIC ACID) as needed CHELATED ZINC 23 MG  LOZG (ZINC) as needed GLUCOSAMINE 1500 COMPLEX   CAPS (GLUCOSAMINE-CHONDROIT-VIT C-MN) 1-3 once daily * CHONDROITIN 1500 once daily AMARYL 2 MG TABS (GLIMEPIRIDE) Take one (1) by mouth once a day VITAMIN D 16109 UNIT  CAPS (ERGOCALCIFEROL) one by mouth weekly ONETOUCH ULTRA TEST  STRP (GLUCOSE BLOOD) one QD, use one every day BENICAR HCT 20-12.5 MG TABS (OLMESARTAN MEDOXOMIL-HCTZ) one by mouth daily   Current Allergies: No known allergies   Past Medical History:    Reviewed history from 12/03/2006 and no changes required:       Allergic rhinitis       Hypertension       Osteoporosis       Diabetes mellitus, type II       back pain       Osteoarthritis  Past Surgical History:    Reviewed history from 12/03/2006 and no changes required:        breast bx       Lumpectomy     Review of Systems  The patient denies anorexia, fever, weight loss, weight gain, vision loss, decreased hearing, hoarseness, chest pain, syncope, dyspnea on exertion, peripheral edema, prolonged cough, headaches, hemoptysis, abdominal pain, melena, hematochezia, severe indigestion/heartburn, hematuria, incontinence, genital sores, muscle weakness, suspicious skin lesions, transient blindness, difficulty walking, depression, unusual weight change, abnormal  bleeding, enlarged lymph nodes, angioedema, and breast masses.     Physical Exam  General:     Well-developed,well-nourished,in no acute distress; alert,appropriate and cooperative throughout examination Head:     Normocephalic and atraumatic without obvious abnormalities. No apparent alopecia or balding. Eyes:     pupils equal and pupils round.   Ears:     R ear normal and L ear normal.   Nose:     no external deformity and no nasal discharge.   Mouth:     pharynx pink and moist and no exudates.   Neck:     supple and full ROM.   Lungs:     normal respiratory effort, no crackles, and no wheezes.   Heart:     normal rate and regular rhythm.   Abdomen:     Bowel sounds positive,abdomen soft and non-tender without masses, organomegaly or hernias noted. Msk:     normal ROM, no joint warmth, no redness over joints, and no joint deformities.   Extremities:     trace left pedal edema and trace right pedal edema.   Neurologic:     alert & oriented X3, cranial nerves II-XII intact, and DTRs symmetrical and normal.      Impression & Recommendations:  Problem # 1:  PREVENTIVE HEALTH CARE (ICD-V70.0) health and reviewed labs, protoicols and immunizations  Problem # 2:  DIABETES MELLITUS, TYPE II (ICD-250.00)  Her updated medication list for this problem includes:    Amaryl 2 Mg Tabs (Glimepiride) .Marland Kitchen... Take one (1) by mouth once a day    Benicar Hct 20-12.5 Mg Tabs (Olmesartan medoxomil-hctz) ..... One by mouth daily  Labs Reviewed: HgBA1c: 6.7 (03/11/2008)   Creat: 1.2 (03/11/2008)   Microalbumin: 1.1 (03/11/2008)   Problem # 3:  BENIGN MIGRATORY GLOSSITIS (ICD-529.1)  Problem # 4:  OSTEOPOROSIS (ICD-733.00)  Her updated medication list for this problem includes:    Actonel 150 Mg Tabs (Risedronate sodium) ..... One by mouth monthly as directed    Vitamin D 96045 Unit Caps (Ergocalciferol) ..... One by mouth weekly   Complete Medication List: 1)  Actonel 150 Mg Tabs  (Risedronate sodium) .... One by mouth monthly as directed 2)  Xanax 0.25 Mg Tabs (Alprazolam) .... As needed 3)  Vitamin C 500 Mg Tabs (Ascorbic acid) .... As needed 4)  Chelated Zinc 23 Mg Lozg (Zinc) .... As needed 5)  Glucosamine 1500 Complex Caps (Glucosamine-chondroit-vit c-mn) .Marland Kitchen.. 1-3 once daily 6)  Chondroitin 1500  .... Once daily 7)  Amaryl 2 Mg Tabs (Glimepiride) .... Take one (1) by mouth once a day 8)  Vitamin D 40981 Unit Caps (Ergocalciferol) .... One by mouth weekly 9)  Onetouch Ultra Test Strp (Glucose blood) .... One qd, use one every day 10)  Benicar Hct 20-12.5 Mg Tabs (Olmesartan medoxomil-hctz) .... One by mouth daily 11)  Nystatin 100000 Unit/ml Susp (Nystatin) .Marland Kitchen.. 10 cc by mouth gargle and swallow three times a day for 14 days  Other Orders: EKG w/ Interpretation (93000)   Patient Instructions: 1)  Please schedule a follow-up appointment in 3 months.   Prescriptions: VITAMIN D 19147 UNIT  CAPS (ERGOCALCIFEROL) one by mouth weekly  #5 x 3   Entered and Authorized by:   Stacie Glaze MD   Signed by:   Stacie Glaze MD on 03/18/2008   Method used:   Electronically to        [facility]  [phone]* (retail)       [address]       [place], [place]  [address]       Ph: [phone] or [phone]       Fax: [phone]   RxID:   [id number] NYSTATIN 100000 UNIT/ML SUSP (NYSTATIN) 10 cc by mouth gargle and swallow three times a day for 14 days  #450cc x 0   Entered and Authorized by:   Stacie Glaze MD   Signed by:   Stacie Glaze MD on 03/18/2008   Method used:   Electronically to        [facility]  [phone]* (retail)       [address]       [place], [place]  [address]       Ph: [phone] or [phone]       Fax: [phone]   RxID:   [id number]  ]  Appended Document: Orders Update    Clinical Lists Changes  Observations: Added new observation of DIAB  EYE EX: Results: Normal.  (03/20/2008 16:54)        Ophthalmology Exam  Procedure date:  03/20/2008  Findings:      Results: Normal.    Ophthalmology Exam  Procedure date:  03/20/2008  Findings:      Results: Normal.

## 2010-05-19 NOTE — Assessment & Plan Note (Signed)
Summary: FLU-SHOT/RCD  Nurse Visit  Flu Vaccine Consent Questions     Do you have a history of severe allergic reactions to this vaccine? no    Any prior history of allergic reactions to egg and/or gelatin? no    Do you have a sensitivity to the preservative Thimersol? no    Do you have a past history of Guillan-Barre Syndrome? no    Do you currently have an acute febrile illness? no    Have you ever had a severe reaction to latex? no    Vaccine information given and explained to patient? yes    Are you currently pregnant? no    Lot Number:[id number]   Site Given  Left Deltoid IM   Prior Medications: ACTONEL 150 MG  TABS (RISEDRONATE SODIUM) one by mouth monthly as directed XANAX 0.25 MG  TABS (ALPRAZOLAM) as needed VITAMIN C 500 MG  TABS (ASCORBIC ACID) as needed CHELATED ZINC 23 MG  LOZG (ZINC) as needed GLUCOSAMINE 1500 COMPLEX   CAPS (GLUCOSAMINE-CHONDROIT-VIT C-MN) 1-3 once daily CHONDROITIN 1500 () once daily AMARYL 2 MG TABS (GLIMEPIRIDE) Take one (1) by mouth once a day COZAAR 50 MG  TABS (LOSARTAN POTASSIUM) one by mouth daily VITAMIN D 54098 UNIT  CAPS (ERGOCALCIFEROL) one by mouth weekly ONETOUCH ULTRA TEST  STRP (GLUCOSE BLOOD) one QD, use one every day Current Allergies: No known allergies     Orders Added: 1)  Admin 1st Vaccine [90471] 2)  Flu Vaccine 37yrs + [place].Dew    ]

## 2010-05-19 NOTE — Assessment & Plan Note (Signed)
Summary: SWOLLEN-KNEE/PAIN/RCD   Vital Signs:  Patient Profile:   77 Years Old Female Height:     61 inches Temp:     98.1 degrees F oral Pulse rate:   76 / minute BP sitting:   130 / 80  (left arm)  Vitals Entered By: Willy Eddy, LPN (December 03, 2006 5:11 PM)               Chief Complaint:  c/o rt knee pain and swelling.  Acute Visit History: Knee Pain HPI  Hx of meniscal tear  Patient complains of pain in knee(s) (L,R, Both):  right Onset of symptoms:  chronic Pain currently localized to:  entire knee Pain is better with: ice Pain is worse with: activity Knee appears to lock at times (Y,N): yes  History of trauma (Y,N):no   When:  Description of injury:   Past history of knee problems (Y,N):  Past history of knee surgery (Y,N):  Which knee:      When:      By whom:   Injury is work related (Y,N):  Occupation:  Employer:    Pt has not had an MR of knee     Past Medical History:    Reviewed history and no changes required:       Allergic rhinitis       Hypertension       Osteoporosis       Diabetes mellitus, type II       back pain       Osteoarthritis  Past Surgical History:    Denies surgical history  escept breast bx     Physical Exam  General:     Well-developed,well-nourished,in no acute distress; alert,appropriate and cooperative throughout examination   Knee Exam  Gait:    limp noted-right.    Inspection:    swelling:   Palpation:    tenderness R-medial joint line, tenderness R-lateral joint line, and tenderness R-Parapatellar.    Vascular:    dorsalis pedis and posterior tibial pulses 2+ and symmetric, capillary refill < 2 seconds, normal hair pattern, no evidence of ischemia.   Knee Exam:    Right:    Inspection:  Abnormal    Palpation:  Abnormal       Location:  lateral capsule    Tenderness:  difffuse    Swelling:  diffuse    Erythema:  lateral collateral   Review of Systems       per HPI    Impression  & Recommendations:  Problem # 1:  OSTEOARTHROSIS, LOCAL NOS, LOWER LEG (ICD-715.36) Discussed use of medications, application of heat or cold, and exercises.  Orders: Joint Aspirate / Injection, Large (20610) Depo- Medrol 80mg  (J1040) Radiology Referral (Radiology) naprolyn one a day  Problem # 2:  HYPERTENSION (ICD-401.9)  Her updated medication list for this problem includes:    Cozaar 50 Mg Tabs (Losartan potassium) ..... Once daily  BP today: 130/80  Labs Reviewed: Chol: 191 (08/23/2006)   HDL: 51.7 (08/23/2006)   LDL: 121 (08/23/2006)   TG: 93 (08/23/2006)   Problem # 3:  DIABETES MELLITUS, TYPE II (ICD-250.00)  Her updated medication list for this problem includes:    Cozaar 50 Mg Tabs (Losartan potassium) ..... Once daily  Labs Reviewed: HgBA1c: 6.9 (08/23/2006)      Complete Medication List: 1)  Cozaar 50 Mg Tabs (Losartan potassium) .... Once daily 2)  Actonel 35 Mg Tabs (Risedronate sodium) .... One every week

## 2010-05-19 NOTE — Assessment & Plan Note (Signed)
Summary: 1 month ov/nta   Vital Signs:  Patient Profile:   77 Years Old Female Height:     61 inches Weight:      149 pounds Temp:     98.5 degrees F oral Pulse rate:   82 / minute Resp:     12 per minute BP sitting:   138 / 76  Vitals Entered By: Lynann Beaver CMA (April 25, 2007 11:35 AM)                 Chief Complaint:  rov.  History of Present Illness: DM and HTN followup  Diabetes Management History:      The patient is a 77 years old female who comes in for evaluation of DM Type 2.  She has not been enrolled in the "Diabetic Education Program".  She is checking home blood sugars.  She says that she is exercising.  Type of exercise includes: walking.  Duration of exercise is estimated to be 20 min.  She is doing this 3 times per week.    Hypertension History:      Positive major cardiovascular risk factors include female age 67 years old or older, diabetes, and hypertension.  Negative major cardiovascular risk factors include negative family history for ischemic heart disease and non-tobacco-user status.      Current Allergies (reviewed today): No known allergies  Updated/Current Medications (including changes made in today's visit):  ACTONEL 150 MG  TABS (RISEDRONATE SODIUM) one by mouth monthly as directed XANAX 0.25 MG  TABS (ALPRAZOLAM) as needed VITAMIN C 500 MG  TABS (ASCORBIC ACID) as needed CHELATED ZINC 23 MG  LOZG (ZINC) as needed GLUCOSAMINE 1500 COMPLEX   CAPS (GLUCOSAMINE-CHONDROIT-VIT C-MN) 1-3 once daily * CHONDROITIN 1500 once daily GLUCOPHAGE XR 500 MG TB24 (METFORMIN HCL) HOLD!!!!!! AMARYL 2 MG TABS (GLIMEPIRIDE) Take one (1) by mouth once a day COZAAR 50 MG  TABS (LOSARTAN POTASSIUM) one by mouth daily   Past Medical History:    Reviewed history from 12/03/2006 and no changes required:       Allergic rhinitis       Hypertension       Osteoporosis       Diabetes mellitus, type II       back pain       Osteoarthritis  Past Surgical  History:    Reviewed history from 12/03/2006 and no changes required:       Denies surgical history  escept breast bx        Impression & Recommendations:  Problem # 1:  DIABETES MELLITUS, TYPE II (ICD-250.00)  Her updated medication list for this problem includes:    Glucophage Xr 500 Mg Tb24 (Metformin hcl) ..... Hold!!!!!!    Amaryl 2 Mg Tabs (Glimepiride) .Marland Kitchen... Take one (1) by mouth once a day    Cozaar 50 Mg Tabs (Losartan potassium) ..... One by mouth daily  Orders: TLB-BMP (Basic Metabolic Panel-BMET) (80048-METABOL) TLB-A1C / Hgb A1C (Glycohemoglobin) (83036-A1C)   Problem # 2:  BACK PAIN (ICD-724.5) Assessment: Improved improving The following medications were removed from the medication list:    Ultram 50 Mg Tabs (Tramadol hcl) ..... One by mouth q 6 hours as needed knee pain Discussed use of moist heat or ice, modified activities, medications, and stretching/strengthening exercises. Back care instructions given. To be seen in 2 weeks if no improvement; sooner if worsening of symptoms.   Problem # 3:  HYPERTENSION (ICD-401.9)  The following medications were removed from the medication  list:    Bystolic 5 Mg Tabs (Nebivolol hcl) ..... One by mouth daily  Her updated medication list for this problem includes:    Cozaar 50 Mg Tabs (Losartan potassium) ..... One by mouth daily  BP today: 138/76 Prior BP: 140/70 (03/19/2007)  10 Yr Risk Heart Disease: 20 %  Labs Reviewed: Creat: 0.9 (01/18/2007) Chol: 193 (01/18/2007)   HDL: 47.0 (01/18/2007)   LDL: 122 (01/18/2007)   TG: 121 (01/18/2007)   Complete Medication List: 1)  Actonel 150 Mg Tabs (Risedronate sodium) .... One by mouth monthly as directed 2)  Xanax 0.25 Mg Tabs (Alprazolam) .... As needed 3)  Vitamin C 500 Mg Tabs (Ascorbic acid) .... As needed 4)  Chelated Zinc 23 Mg Lozg (Zinc) .... As needed 5)  Glucosamine 1500 Complex Caps (Glucosamine-chondroit-vit c-mn) .Marland Kitchen.. 1-3 once daily 6)  Chondroitin  1500  .... Once daily 7)  Glucophage Xr 500 Mg Tb24 (Metformin hcl) .... Hold!!!!!! 8)  Amaryl 2 Mg Tabs (Glimepiride) .... Take one (1) by mouth once a day 9)  Cozaar 50 Mg Tabs (Losartan potassium) .... One by mouth daily  Diabetes Management Assessment/Plan:      The following lipid goals have been established for the patient: Total cholesterol goal of 200; LDL cholesterol goal of 100; HDL cholesterol goal of 40; Triglyceride goal of 200.    Hypertension Assessment/Plan:      The patient's hypertensive risk group is category C: Target organ damage and/or diabetes.  Her calculated 10 year risk of coronary heart disease is 20 %.  Today's blood pressure is 138/76.     Patient Instructions: 1)  Please schedule a follow-up appointment in 3 months.    ]  Appended Document: Orders Update    Clinical Lists Changes  Orders: Added new Service order of Venipuncture [phone]) - Signed

## 2010-05-19 NOTE — Procedures (Signed)
Summary: colonoscopy   Colonoscopy  Procedure date:  11/17/2004  Findings:      Results: Diverticulosis.       Location:  [facility].    Comments:      Repeat colonoscopy in 5 years.  Patient Name: Patricia Moore, Patricia Moore MRN:  Procedure Procedures: Colonoscopy CPT: [phone].  Personnel: Endoscopist: Wilhemina Bonito. Marina Goodell, MD.  Referred By: Stacie Glaze, MD.  Exam Location: Exam performed in Outpatient Clinic. Outpatient  Patient Consent: Procedure, Alternatives, Risks and Benefits discussed, consent obtained, from patient. Consent was obtained by the RN.  Indications  Increased Risk Screening: For family history of colorectal neoplasia, in   Comments: Last colonoscopy 2001 (hyperplastic polyp) History  Current Medications: Patient is not currently taking Coumadin.  Pre-Exam Physical: Performed Nov 17, 2004. Entire physical exam was normal.  Exam Exam: Extent of exam reached: Cecum, extent intended: Cecum.  The cecum was identified by appendiceal orifice and IC valve. Patient position: on left side. Colon retroflexion performed. Images taken. ASA Classification: II. Tolerance: excellent.  Monitoring: Pulse and BP monitoring, Oximetry used. Supplemental O2 given.  Colon Prep Used Miralax for colon prep. Prep results: excellent.  Sedation Meds: Patient assessed and found to be appropriate for moderate (conscious) sedation. Fentanyl 75 mcg. given IV. Versed 6 mg. given IV.  Findings NORMAL EXAM: Cecum to Rectum.  ANGIODYSPLASIA (AVMs): 5 AVMs, maximum size 1 mm, non-bleeding in ICD9: Angiodysplasia without Hemorrhage: 569.84. Comments: ascend and transv colon.  - DIVERTICULOSIS: Descending Colon to Sigmoid Colon. ICD9: Diverticulosis, Colon: 562.10.   Assessment  Diagnoses: 562.10: Diverticulosis, Colon.  569.84: Angiodysplasia without Hemorrhage.   Comments: NO POLYPS SEEN Events  Unplanned Interventions: No intervention was required.  Unplanned  Events: There were no complications. Plans Disposition: After procedure patient sent to recovery. After recovery patient sent home.  Scheduling/Referral: Colonoscopy, to Wilhemina Bonito. Marina Goodell, MD, IN 5 YEARS GIVEN FAM HX,    This report was created from the original endoscopy report, which was reviewed and signed by the above listed endoscopist.   cc:  Stacie Glaze, MD      The Patient

## 2010-05-19 NOTE — Assessment & Plan Note (Signed)
Summary: 2 month rov/njr   Vital Signs:  Patient profile:   77 year old female Height:      61 inches Weight:      154 pounds BMI:     29.20 Temp:     98.2 degrees F oral Pulse rate:   72 / minute Resp:     14 per minute BP sitting:   130 / 80  (left arm)  Vitals Entered By: Willy Eddy, LPN (May 06, 2009 10:52 AM) CC: roa, Hypertension Management, Type 2 diabetes mellitus follow-up   Primary Care Provider:  Stacie Glaze MD  CC:  roa, Hypertension Management, and Type 2 diabetes mellitus follow-up.  History of Present Illness: good blood sugar reading laxis is controlling edema   Type 2 Diabetes Mellitus Follow-Up      This is a 77 year old woman who presents for Type 2 diabetes mellitus follow-up.  The patient denies polyuria, polydipsia, blurred vision, self managed hypoglycemia, hypoglycemia requiring help, weight loss, weight gain, and numbness of extremities.  The patient denies the following symptoms: neuropathic pain, chest pain, vomiting, orthostatic symptoms, poor wound healing, intermittent claudication, vision loss, and foot ulcer.  Since the last visit the patient reports good dietary compliance.  The patient has been measuring capillary blood glucose before breakfast.  Since the last visit, the patient reports having had eye care by an ophthalmologist.    Hypertension History:      She denies headache, chest pain, palpitations, dyspnea with exertion, orthopnea, PND, peripheral edema, visual symptoms, neurologic problems, syncope, and side effects from treatment.        Positive major cardiovascular risk factors include female age 18 years old or older, diabetes, hyperlipidemia, and hypertension.  Negative major cardiovascular risk factors include negative family history for ischemic heart disease and non-tobacco-user status.     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) 4)  Edema  (ICD-782.3) 5)  Chest Pain, Atypical  (ICD-786.59) 6)  Rectal Bleeding  (ICD-569.3) 7)  Angiodysplasia-intestine  (ICD-569.84) 8)  Family Hx Colon Cancer  (ICD-V16.0) 9)  Flatulence-gas-bloating  (ICD-787.3) 10)  Diverticulosis, Colon  (ICD-562.10) 11)  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 One By Mouth Monthly As Directed 2)  Alprazolam 0.25 Mg Tbdp (Alprazolam) .... One By Mouth Three Times A Day  As Needed 3)  Vitamin C 500 Mg  Tabs (Ascorbic Acid) .... As Needed 4)  Chelated Zinc 23 Mg  Lozg (Zinc) .... As Needed 5)  Glucosamine 1500 Complex   Caps (Glucosamine-Chondroit-Vit C-Mn) .Marland Kitchen.. 1-3 Once Daily 6)  Chondroitin 1500 .... Once Daily 7)  Glucovance 2.5-500  Mg Tabs (Glyburide-Metformin) .... One By Mouth Bid 8)  Vitamin D 16109 Unit  Caps (Ergocalciferol) .... One By Mouth Weekly 9)  Onetouch Ultra Test  Strp (Glucose Blood) .... Test Two Times A Day As Directed 10)  Benicar 40 Mg Tabs (Olmesartan Medoxomil) .... One By Mouth Dailly 11)  Lasix 20 Mg Tabs (Furosemide) .... One By Mouth Daily 12)  Nystatin 100000 Unit/ml Susp (Nystatin) .Marland Kitchen.. 10 Cc Gargle and Swalloww Three  Times A Day Prn 13)  One Touch Delica Lancets  Misc (Lancets) .... Use As Directed 14)  Ciprofloxacin Hcl 500 Mg Tabs (Ciprofloxacin Hcl) .... One By Mouth Two Times A Day For 10 Days 15)  Metronidazole 250 Mg Tabs (Metronidazole) .... One By Mouth Three Times A Day  For 10 Days  Current Medications (verified): 1)  Alprazolam 0.25 Mg Tbdp (Alprazolam) .... One By Mouth Three Times A Day  As Needed 2)  Vitamin C 500 Mg  Tabs (Ascorbic Acid) .... As Needed 3)  Chelated Zinc 23 Mg  Lozg (Zinc) .... As Needed 4)  Glucovance 2.5-500 Mg Tabs (Glyburide-Metformin) .... One By Mouth Bid 5)  Vitamin D 60454 Unit  Caps (Ergocalciferol) .... One By Mouth Weekly 6)  Onetouch Ultra Test  Strp (Glucose Blood) .... Test Two Times A Day As Directed 7)  Benicar 40 Mg Tabs (Olmesartan Medoxomil) .... One By Mouth Dailly 8)  Lasix 20 Mg Tabs (Furosemide) .... One By Mouth Daily 9)  Nystatin 100000 Unit/ml Susp (Nystatin) .Marland Kitchen.. 10 Cc Gargle and Swalloww Three Times A Day Prn 10)  One Touch Delica Lancets  Misc (Lancets) .... Use As Directed  Allergies (verified): No Known Drug Allergies  Past History:  Family History: Last updated: 05/26/2008 Family History Diabetes 1st degree relative Family History Hypertension Family History of Breast Cancer Maternal Aunts x2 Family History of Colon Cancer:Paternal Uncles x5 Family History of Heart Disease: Mother & Father  Social History: Last updated: 05/26/2008 Retired Married Patient has never smoked.  Alcohol Use - yes- rarely Illicit Drug Use - no  Risk Factors: Alcohol Use: 1 (05/06/2009) Exercise: yes (02/01/2007)  Risk Factors: Smoking Status: never (05/06/2009) Passive Smoke Exposure: no (05/06/2009)  Past medical, surgical, family and social histories (including risk factors) reviewed, and no changes noted (except as noted below).  Past Medical History: Reviewed history from 05/26/2008 and no changes required. Allergic  rhinitis Hypertension Osteoporosis Diabetes mellitus, type II back pain Osteoarthritis Adenomatous Colon Polyps Diverticulosis Hyperlipidemia  Past Surgical History: Reviewed history from 03/18/2008 and no changes required.  breast bx Lumpectomy  Family History: Reviewed history from 05/26/2008 and no changes required. Family History Diabetes 1st degree relative Family History Hypertension Family History of Breast Cancer Maternal Aunts x2 Family History of Colon Cancer:Paternal Uncles x5 Family History of Heart Disease: Mother & Father  Social History: Reviewed history from 05/26/2008 and no changes required. Retired Married Patient has never smoked.  Alcohol Use - yes- rarely Illicit Drug Use - no  Review of Systems  The patient denies anorexia, fever, weight loss, weight gain, vision loss, decreased hearing, hoarseness, chest pain, syncope, dyspnea on exertion, peripheral edema, prolonged cough, headaches, hemoptysis, abdominal pain, melena, hematochezia, severe indigestion/heartburn, hematuria, incontinence, genital sores, muscle weakness, suspicious skin lesions, transient blindness, difficulty walking, depression, unusual weight change, abnormal bleeding, enlarged lymph nodes, angioedema, and breast masses.    Physical Exam  General:  Well developed, well nourished, no acute distress. Head:  Normocephalic and atraumatic. Eyes:  pupils equal and pupils round.  Ears:  R ear normal and L ear normal.   Nose:  No deformity, discharge,  or lesions. Mouth:  No deformity or lesions,   Neck:  supple and full ROM.   Lungs:  Clear throughout to auscultation. Heart:  Regular rate and rhythm; no murmurs, rubs,  or bruits. Abdomen:  soft, non-tender, and RLQ tenderness.   Extremities:  trace left pedal edema and trace right pedal edema.     Impression & Recommendations:  Problem # 1:  OSTEOPOROSIS (ICD-733.00) will stop actonil for two years and monter the bone diesity  if needs to resume would give reclast The following medications were removed from the medication list:    Actonel 150 Mg Tabs (Risedronate sodium) ..... One by mouth monthly as directed  Discussed medication use, applications of heat or ice, and exercises.   Problem # 2:  CARCINOMA, SKIN, SQUAMOUS CELL, FACE (ICD-173.3) derm is treat the reexcission site with imiquimod  Problem # 3:  OSTEOARTHROSIS, LOCAL NOS, LOWER LEG (ICD-715.36)  Discussed use of medications, application of heat or cold, and exercises.   Problem # 4:  DIABETES MELLITUS, TYPE II (ICD-250.00)  Her updated medigood blood sugar readisncation list for this problem includes:    Glucovance 2.5-500 Mg Tabs (Glyburide-metformin) ..... One by mouth bid    Benicar 40 Mg Tabs (Olmesartan medoxomil) ..... One by mouth dailly  Labs Reviewed: Creat: 1.0 (03/29/2009)     Last Eye Exam: Results: Normal.  (03/20/2008) Reviewed HgBA1c results: 6.8 (03/29/2009)  6.9 (11/02/2008)  Complete Medication List: 1)  Alprazolam 0.25 Mg Tbdp (Alprazolam) .... One by mouth three times a day  as needed 2)  Vitamin C 500 Mg Tabs (Ascorbic acid) .... As needed 3)  Chelated Zinc 23 Mg Lozg (Zinc) .... As needed 4)  Glucovance 2.5-500 Mg Tabs (Glyburide-metformin) .... One by mouth bid 5)  Vitamin D 16109 Unit Caps (Ergocalciferol) .... One by mouth weekly 6)  Onetouch Ultra Test Strp (Glucose blood) .... Test two times a day as directed 7)  Benicar 40 Mg Tabs (Olmesartan medoxomil) .... One by mouth dailly 8)  Lasix 20 Mg Tabs (Furosemide) .... One by mouth daily 9)  Nystatin 100000 Unit/ml Susp (Nystatin) .Marland Kitchen.. 10 cc gargle and swalloww three times a day prn 10)  One Touch Delica Lancets Misc (Lancets) .... Use as directed  Other Orders: Prescription Created Electronically [id number])  Hypertension Assessment/Plan:      The patient's hypertensive risk group is category C: Target organ damage and/or diabetes.  Her calculated 10 year risk of  coronary heart disease is 20 %.  Today's blood pressure is 130/80.  Her blood pressure goal is < 130/80.  Patient Instructions: 1)  Please schedule a follow-up appointment in 3 months. 2)  HbgA1C prior to visit, ICD-9:250.00 3)  Hepatic Panel prior to visit, ICD-9:995.20 4)  Lipid Panel prior to visit, ICD-9:272.4 5)  TSH prior to visit, ICD-9:272.4244.8 Prescriptions: LASIX 20 MG TABS (FUROSEMIDE) one by mouth daily  #90 x 3   Entered and Authorized by:   Stacie Glaze MD   Signed by:   Stacie Glaze MD on 05/06/2009   Method used:   Electronically to        [facility]  [phone]* (retail)       [address]       [place], [place]  [address]       Ph: [phone] or [phone]  Fax: [phone]   RxID:   [id number]    Preventive Care Screening  Bone Density:    Date:  05/03/2009    Next Due:  05/2011    Results:  abnormal std dev

## 2010-05-19 NOTE — Assessment & Plan Note (Signed)
Summary: 3 month rov/njr   Vital Signs:  Patient profile:   77 year old female Height:      61 inches Weight:      154 pounds BMI:     29.20 Temp:     98.2 degrees F oral Pulse rate:   72 / minute Resp:     14 per minute BP sitting:   140 / 80  (left arm)  Vitals Entered By: Willy Eddy, LPN (August 04, 2009 9:45 AM)  Nutrition Counseling: Patient's BMI is greater than 25 and therefore counseled on weight management options. CC: roa labs- continues to c/o back pain, Lipid Management   Primary Care Provider:  Stacie Glaze MD  CC:  roa labs- continues to c/o back pain and Lipid Management.  History of Present Illness: follow up of medication and labs  Lipid Management History:      Positive NCEP/ATP III risk factors include female age 11 years old or older, diabetes, and hypertension.  Negative NCEP/ATP III risk factors include HDL cholesterol greater than 60, no family history for ischemic heart disease, and non-tobacco-user status.        Adjunctive measures started by the patient include omega-3 supplements.  She expresses no side effects from her lipid-lowering medication.  The patient denies any symptoms to suggest myopathy or liver disease.     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) 6)  Edema  (ICD-782.3) 7)  Chest Pain, Atypical  (ICD-786.59) 8)  Rectal Bleeding  (ICD-569.3) 9)  Angiodysplasia-intestine  (ICD-569.84) 10)  Family Hx Colon Cancer  (ICD-V16.0) 11)  Flatulence-gas-bloating  (ICD-787.3) 12)  Diverticulosis, Colon  (ICD-562.10) 13)  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) 14)  Cough Variant Asthma  (ICD-493.82) 15)  Dermatophytosis of Nail  (ICD-110.1) 16)  Bronchitis, Acute  (ICD-466.0) 17)  Influenza  (ICD-487.8) 18)  Family History Diabetes 1st Degree Relative  (ICD-V18.0) 19)  Preventive Health Care  (ICD-V70.0) 20)  Osteoarthrosis, Local Nos, Lower Leg  (ICD-715.36) 21)  Osteoarthritis  (ICD-715.90) 22)  Back Pain  (ICD-724.5) 23)  Diabetes Mellitus, Type II  (ICD-250.00) 24)  Osteoporosis  (ICD-733.00) 25)  Hypertension  (ICD-401.9) 26)  Allergic Rhinitis  (ICD-477.9)  Current Problems (verified): 1)  Cellulitis and Abscess of Unspecified Site  (ICD-682.9) 2)  Carcinoma, Skin, Squamous Cell, Face  (ICD-173.3) 3)  Diverticulitis of Colon  (ICD-562.11) 4)  Varicose Veins Lower Extremities W/oth Comps  (ICD-454.8) 5)  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) 6)  Edema  (ICD-782.3) 7)  Chest Pain, Atypical  (ICD-786.59) 8)  Rectal Bleeding  (ICD-569.3) 9)  Angiodysplasia-intestine  (ICD-569.84) 10)  Family Hx Colon Cancer  (ICD-V16.0) 11)  Flatulence-gas-bloating  (ICD-787.3) 12)  Diverticulosis, Colon  (ICD-562.10) 13)  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 One By Mouth Three Times A Day  As Needed 2)  Vitamin C 500 Mg  Tabs (Ascorbic Acid) .... As Needed 3)  Chelated Zinc 23 Mg  Lozg (Zinc) .... As Needed 4)   Glucovance 2.5-500 Mg Tabs (Glyburide-Metformin) .... One By Mouth Bid 5)  Vitamin D 04540 Unit  Caps (Ergocalciferol) .... One By Mouth Weekly 6)  Onetouch Ultra Test  Strp (Glucose Blood) .... Test Two Times A Day As Directed 7)  Benicar 40 Mg Tabs (Olmesartan Medoxomil) .... One By Mouth Dailly 8)  Lasix 20 Mg Tabs (Furosemide) .... One By Mouth Daily 9)  Nystatin 100000 Unit/ml Susp (Nystatin) .Marland Kitchen.. 10 Cc Gargle and Swalloww Three Times A Day Prn 10)  One Touch Delica Lancets  Misc (Lancets) .... Use As Directed 11)  Sulfamethoxazole-Tmp Ds 800-160 Mg Tabs (Sulfamethoxazole-Trimethoprim) .... One By Mouth Bid For 10 Days 12)  Doxycycline Hyclate 100 Mg Caps (Doxycycline Hyclate) .... One By Mouth Bid For 10 Days 13)  Mupirocin 2 % Oint (Mupirocin) .... Apply To Site Two Times A Day  Current Medications (verified): 1)  Alprazolam 0.25 Mg Tbdp (Alprazolam) .... One By Mouth Three Times A Day  As Needed 2)  Vitamin C 500 Mg  Tabs (Ascorbic Acid) .... As Needed 3)  Chelated Zinc 23 Mg  Lozg (Zinc) .... As Needed 4)  Glucovance 2.5-500 Mg Tabs (Glyburide-Metformin) .... One By Mouth Bid 5)  Vitamin D 98119 Unit  Caps (Ergocalciferol) .... One By Mouth Weekly 6)  Onetouch Ultra Test  Strp (Glucose Blood) .... Test Two Times A Day As Directed 7)  Benicar 40 Mg Tabs (Olmesartan Medoxomil) .... One By Mouth Dailly 8)  Lasix 20 Mg Tabs (Furosemide) .... One By Mouth Daily 9)  Nystatin 100000 Unit/ml Susp (Nystatin) .Marland Kitchen.. 10 Cc Gargle and Swalloww Three Times A Day Prn 10)  One Touch Delica Lancets  Misc (Lancets) .... Use As Directed  Allergies (verified): No Known Drug Allergies  Past History:  Family History: Last updated: 05/26/2008 Family History Diabetes 1st degree relative Family History Hypertension Family History of Breast Cancer Maternal Aunts x2 Family History of Colon Cancer:Paternal Uncles x5 Family History of Heart Disease: Mother & Father  Social History: Last updated:  05/26/2008 Retired Married Patient has never smoked.  Alcohol Use - yes- rarely Illicit Drug Use - no  Risk Factors: Alcohol Use: 1 (08/04/2009) Exercise: yes (02/01/2007)  Risk Factors: Smoking Status: never (08/04/2009) Passive Smoke Exposure: no (08/04/2009)  Past medical, surgical, family and social histories (including risk factors) reviewed, and no changes noted (except as noted below).  Past Medical History: Reviewed history from 05/26/2008 and no changes required. Allergic rhinitis Hypertension Osteoporosis Diabetes mellitus, type II back pain Osteoarthritis Adenomatous Colon Polyps Diverticulosis Hyperlipidemia  Past Surgical History: Reviewed history from 03/18/2008 and no changes required.  breast bx Lumpectomy PMH reviewed for relevance  Family History: Reviewed history from 05/26/2008 and no changes required. Family History Diabetes 1st degree relative Family History Hypertension Family History of Breast Cancer Maternal Aunts x2 Family History of Colon Cancer:Paternal Uncles x5 Family History of Heart Disease: Mother & Father  Social History: Reviewed history from 05/26/2008 and no changes required. Retired Married Patient has never smoked.  Alcohol Use - yes- rarely Illicit Drug  Use - no  Review of Systems  The patient denies anorexia, fever, weight loss, weight gain, vision loss, decreased hearing, hoarseness, chest pain, syncope, dyspnea on exertion, peripheral edema, prolonged cough, headaches, hemoptysis, abdominal pain, melena, hematochezia, severe indigestion/heartburn, hematuria, incontinence, genital sores, muscle weakness, suspicious skin lesions, transient blindness, difficulty walking, depression, unusual weight change, abnormal bleeding, enlarged lymph nodes, angioedema, and breast masses.    Physical Exam  General:  Well developed, well nourished, no acute distress. Head:  Normocephalic and atraumatic. Eyes:  pupils equal and  pupils round.   Ears:  R ear normal and L ear normal.   Nose:  No deformity, discharge,  or lesions. Mouth:  No deformity or lesions,   Chest Wall:  no mass, chest wall tenderness, and costochondrial tenderness.   Lungs:  Clear throughout to auscultation. Heart:  Regular rate and rhythm; no murmurs, rubs,  or bruits. Abdomen:  soft, non-tender, and RLQ tenderness.     Impression & Recommendations:  Problem # 1:  HYPERLIPIDEMIA, MILD (ICD-272.4) duscussion of fish hold Labs Reviewed: SGOT: 33 (07/28/2009)   SGPT: 30 (07/28/2009)  Lipid Goals: Chol Goal: 200 (08/04/2009)   HDL Goal: 40 (08/04/2009)   LDL Goal: 100 (08/04/2009)   TG Goal: 150 (08/04/2009)  10 Yr Risk Heart Disease: 17 % Prior 10 Yr Risk Heart Disease: 27 % (07/20/2009)   HDL:62.40 (07/28/2009), 45.80 (03/29/2009)  LDL:126 (03/29/2009), DEL (03/11/2008)  Chol:204 (07/28/2009), 197 (03/29/2009)  Trig:116.0 (07/28/2009), 125.0 (03/29/2009)  Problem # 2:  VARICOSE VEINS LOWER EXTREMITIES W/OTH COMPS (ICD-454.8) the fluid retension has improved  Problem # 3:  OSTEOARTHROSIS, LOCAL NOS, LOWER LEG (ICD-715.36)  Discussed use of medications, application of heat or cold, and exercises.   Problem # 4:  BACK PAIN (ICD-724.5) reveiwed the xray Discussed use of moist heat or ice, modified activities, medications, and stretching/strengthening exercises. Back care instructions given. To be seen in 2 weeks if no improvement; sooner if worsening of symptoms.   Complete Medication List: 1)  Alprazolam 0.25 Mg Tbdp (Alprazolam) .... One by mouth three times a day  as needed 2)  Vitamin C 500 Mg Tabs (Ascorbic acid) .... As needed 3)  Chelated Zinc 23 Mg Lozg (Zinc) .... As needed 4)  Glucovance 2.5-500 Mg Tabs (Glyburide-metformin) .... One by mouth bid 5)  Vitamin D 02725 Unit Caps (Ergocalciferol) .... One by mouth weekly 6)  Onetouch Ultra Test Strp (Glucose blood) .... Test two times a day as directed 7)  Benicar 40 Mg Tabs  (Olmesartan medoxomil) .... One by mouth dailly 8)  Lasix 20 Mg Tabs (Furosemide) .... One by mouth daily 9)  Nystatin 100000 Unit/ml Susp (Nystatin) .Marland Kitchen.. 10 cc gargle and swalloww three times a day prn 10)  One Touch Delica Lancets Misc (Lancets) .... Use as directed  Lipid Assessment/Plan:      Based on NCEP/ATP III, the patient's risk factor category is "history of diabetes".  The patient's lipid goals are as follows: Total cholesterol goal is 200; LDL cholesterol goal is 100; HDL cholesterol goal is 40; Triglyceride goal is 150.    Patient Instructions: 1)  Please schedule a follow-up appointment in 3 months.

## 2010-05-19 NOTE — Assessment & Plan Note (Signed)
Summary: CPX/RCD/pt rescd from bump/ccm   Vital Signs:  Patient Profile:   77 Years Old Female Height:     61 inches Weight:      148 pounds Temp:     98.6 degrees F oral Pulse rate:   76 / minute Resp:     12 per minute BP sitting:   130 / 78  (left arm)  Vitals Entered By: Willy Eddy, LPN (February 01, 2007 2:29 PM)                 Chief Complaint:  cpx/yearly pap and breast exam with dr Dietrich Pates 2007.  Diabetes Management History:      She has not been enrolled in the "Diabetic Education Program".  She states understanding of dietary principles but she is not following the appropriate diet.  No sensory loss is reported.  Self foot exams are being performed.  She is checking home blood sugars.  She says that she is exercising.  Type of exercise includes: walking.  Duration of exercise is estimated to be 20 min.  She is doing this 3 times per week.        Hypoglycemic symptoms are not occurring.  No hyperglycemic symptoms are reported.        There are no symptoms to suggest diabetic complications.  Since her last visit, no infections have occurred.  The following treatment plan problems are reported: acceptance or denial issues.     Current Allergies (reviewed today): No known allergies  Updated/Current Medications (including changes made in today's visit):  COZAAR 50 MG  TABS (LOSARTAN POTASSIUM) once daily ACTONEL 150 MG  TABS (RISEDRONATE SODIUM) one by mouth monthly as directed XANAX 0.25 MG  TABS (ALPRAZOLAM) as needed VITAMIN C 500 MG  TABS (ASCORBIC ACID) as needed CHELATED ZINC 23 MG  LOZG (ZINC) as needed GLUCOSAMINE 1500 COMPLEX   CAPS (GLUCOSAMINE-CHONDROIT-VIT C-MN) 1-3 once daily * CHONDROITIN 1500 once daily GLUCOPHAGE XR 500 MG TB24 (METFORMIN HCL) Take one (1) by mouth once a day   Past Medical History:    Reviewed history from 12/03/2006 and no changes required:       Allergic rhinitis       Hypertension       Osteoporosis       Diabetes  mellitus, type II       back pain       Osteoarthritis  Past Surgical History:    Reviewed history from 12/03/2006 and no changes required:       Denies surgical history  escept breast bx   Family History:    Reviewed history and no changes required:  Social History:    Reviewed history and no changes required:       Retired       Married   Risk Factors:  Exercise:  yes    Times per week:  3    Type:  walking   Review of Systems  The patient denies anorexia, fever, weight loss, vision loss, decreased hearing, hoarseness, chest pain, syncope, dyspnea on exhertion, peripheral edema, prolonged cough, hemoptysis, abdominal pain, melena, hematochezia, severe indigestion/heartburn, hematuria, incontinence, genital sores, muscle weakness, suspicious skin lesions, transient blindness, difficulty walking, depression, unusual weight change, abnormal bleeding, enlarged lymph nodes, angioedema, breast masses, and testicular masses.     Physical Exam  General:     Well-developed,well-nourished,in no acute distress; alert,appropriate and cooperative throughout examination Eyes:     No corneal or conjunctival inflammation noted. EOMI. Perrla. Funduscopic  exam benign, without hemorrhages, exudates or papilledema. Vision grossly normal. Ears:     External ear exam shows no significant lesions or deformities.  Otoscopic examination reveals clear canals, tympanic membranes are intact bilaterally without bulging, retraction, inflammation or discharge. Hearing is grossly normal bilaterally. Mouth:     Oral mucosa and oropharynx without lesions or exudates.  Teeth in good repair. Neck:     No deformities, masses, or tenderness noted. Lungs:     Normal respiratory effort, chest expands symmetrically. Lungs are clear to auscultation, no crackles or wheezes. Heart:     normal rate, regular rhythm, and Grade    early2/6 systolic ejection murmur.   Abdomen:     Bowel sounds positive,abdomen soft  and non-tender without masses, organomegaly or hernias noted. Msk:     decreased ROM, joint tenderness, and joint swelling.  knees bilaterally Pulses:     R and L carotid,radial,femoral,dorsalis pedis and posterior tibial pulses are full and equal bilaterally Extremities:     No clubbing, cyanosis, edema, or deformity noted with normal full range of motion of all joints.   Neurologic:     No cranial nerve deficits noted. Station and gait are normal. Plantar reflexes are down-going bilaterally. DTRs are symmetrical throughout. Sensory, motor and coordinative functions appear intact.    Impression & Recommendations:  Problem # 1:  OSTEOARTHROSIS, LOCAL NOS, LOWER LEG (ICD-715.36) menical tear that is healing with glucosamine and exercize with ice treatment using the tens unit with success Orders: Durable Medical Equipment (DME)   Problem # 2:  DIABETES MELLITUS, TYPE II (ICD-250.00) new problem Her updated medication list for this problem includes:    Cozaar 50 Mg Tabs (Losartan potassium) ..... Once daily    Glucophage Xr 500 Mg Tb24 (Metformin hcl) .Marland Kitchen... Take one (1) by mouth once a day  Labs Reviewed: HgBA1c: 7.0 (01/18/2007)   Creat: 0.9 (01/18/2007)      Problem # 3:  Preventive Health Care (ICD-V70.0) flu shot given  Medications Added to Medication List This Visit: 1)  Actonel 150 Mg Tabs (Risedronate sodium) .... One by mouth monthly as directed 2)  Glucosamine 1500 Complex Caps (Glucosamine-chondroit-vit c-mn) .Marland Kitchen.. 1-3 once daily 3)  Chondroitin 1500  .... Once daily 4)  Glucophage Xr 500 Mg Tb24 (Metformin hcl) .... Take one (1) by mouth once a day  Complete Medication List: 1)  Cozaar 50 Mg Tabs (Losartan potassium) .... Once daily 2)  Actonel 150 Mg Tabs (Risedronate sodium) .... One by mouth monthly as directed 3)  Xanax 0.25 Mg Tabs (Alprazolam) .... As needed 4)  Vitamin C 500 Mg Tabs (Ascorbic acid) .... As needed 5)  Chelated Zinc 23 Mg Lozg (Zinc) .... As  needed 6)  Glucosamine 1500 Complex Caps (Glucosamine-chondroit-vit c-mn) .Marland Kitchen.. 1-3 once daily 7)  Chondroitin 1500  .... Once daily 8)  Glucophage Xr 500 Mg Tb24 (Metformin hcl) .... Take one (1) by mouth once a day  Diabetes Management Assessment/Plan:      The following lipid goals have been established for the patient: Total cholesterol goal of 200; LDL cholesterol goal of 100; HDL cholesterol goal of 40; Triglyceride goal of 200.     Patient Instructions: 1)  Please schedule a follow-up appointment in 1 month.    Prescriptions: ACTONEL 150 MG  TABS (RISEDRONATE SODIUM) one by mouth monthly as directed  #1 x 11   Entered and Authorized by:   Stacie Glaze MD   Signed by:   Stacie Glaze MD  on 02/01/2007   Method used:   Print then Give to Patient   RxID:   [id number]  ]

## 2010-05-19 NOTE — Assessment & Plan Note (Signed)
Summary: 2 MONTH ROV/NJR   Vital Signs:  Patient Profile:   77 Years Old Female Height:     61 inches Weight:      153 pounds Temp:     98.5 degrees F oral Pulse rate:   84 / minute Resp:     14 per minute BP sitting:   150 / 80  (left arm)  Vitals Entered By: Willy Eddy, LPN (February 04, 2008 9:45 AM)                 Chief Complaint:  roa labs and Type 2 diabetes mellitus follow-up.  History of Present Illness:  Type 2 Diabetes Mellitus Follow-Up      This is a 77 year old woman who presents for Type 2 diabetes mellitus follow-up.  The patient denies polyuria, polydipsia, blurred vision, self managed hypoglycemia, hypoglycemia requiring help, weight loss, weight gain, and numbness of extremities.  The patient denies the following symptoms: neuropathic pain, chest pain, vomiting, orthostatic symptoms, poor wound healing, intermittent claudication, vision loss, and foot ulcer.  Since the last visit the patient reports good dietary compliance.  The patient has been measuring capillary blood glucose before breakfast.  Since the last visit, the patient reports having had no eye care.  Complications from diabetes include renal insufficiency.      Current Allergies: No known allergies   Past Medical History:    Reviewed history from 12/03/2006 and no changes required:       Allergic rhinitis       Hypertension       Osteoporosis       Diabetes mellitus, type II       back pain       Osteoarthritis  Past Surgical History:    Reviewed history from 12/03/2006 and no changes required:       Denies surgical history  escept breast bx   Family History:    Reviewed history from 03/19/2007 and no changes required:       Family History Diabetes 1st degree relative       Family History Hypertension  Social History:    Reviewed history from 02/01/2007 and no changes required:       Retired       Married    Review of Systems  The patient denies anorexia, fever, weight  loss, weight gain, vision loss, decreased hearing, hoarseness, chest pain, syncope, dyspnea on exertion, peripheral edema, prolonged cough, headaches, hemoptysis, abdominal pain, melena, hematochezia, severe indigestion/heartburn, hematuria, incontinence, genital sores, muscle weakness, suspicious skin lesions, transient blindness, difficulty walking, depression, unusual weight change, abnormal bleeding, enlarged lymph nodes, angioedema, and breast masses.     Physical Exam  General:     Well-developed,well-nourished,in no acute distress; alert,appropriate and cooperative throughout examination Eyes:     No corneal or conjunctival inflammation noted. EOMI. Perrla. Funduscopic exam benign, without hemorrhages, exudates or papilledema. Vision grossly normal. Nose:     mucosal erythema and mucosal edema.   Mouth:     posterior lymphoid hypertrophy.   Neck:     No deformities, masses, or tenderness noted. Lungs:     increased bronchial bs Heart:     normal rate, regular rhythm, and Grade   1/6 systolic ejection murmur.   Abdomen:     Bowel sounds positive,abdomen soft and non-tender without masses, organomegaly or hernias noted. Msk:     decreased ROM, joint tenderness, and joint swelling.  knees bilaterally Extremities:     No clubbing,  cyanosis, edema, or deformity noted with normal full range of motion of all joints.      Impression & Recommendations:  Problem # 1:  DERMATOPHYTOSIS OF NAIL (ICD-110.1) finished the lamisil Discussed nail care and medication treatment options.   Problem # 2:  DIABETES MELLITUS, TYPE II (ICD-250.00)  The following medications were removed from the medication list:    Cozaar 50 Mg Tabs (Losartan potassium) ..... One by mouth daily  Her updated medication list for this problem includes:    Amaryl 2 Mg Tabs (Glimepiride) .Marland Kitchen... Take one (1) by mouth once a day    Benicar Hct 20-12.5 Mg Tabs (Olmesartan medoxomil-hctz) ..... One by mouth daily  Labs  Reviewed: HgBA1c: 6.9 (11/26/2007)   Creat: 1.0 (04/25/2007)   Microalbumin: 1.6 (01/18/2007)   Problem # 3:  HYPERTENSION (ICD-401.9) Assessment: Deteriorated has a slight murmur The following medications were removed from the medication list:    Cozaar 50 Mg Tabs (Losartan potassium) ..... One by mouth daily  Her updated medication list for this problem includes:    Benicar Hct 20-12.5 Mg Tabs (Olmesartan medoxomil-hctz) ..... One by mouth daily  BP today: 150/80 Prior BP: 132/74 (12/03/2007)  Prior 10 Yr Risk Heart Disease: 20 % (04/25/2007)  Labs Reviewed: Creat: 1.0 (04/25/2007) Chol: 226 (11/26/2007)   HDL: 57.9 (11/26/2007)   LDL: DEL (11/26/2007)   TG: 129 (11/26/2007)   Problem # 4:  OSTEOARTHROSIS, LOCAL NOS, LOWER LEG (ICD-715.36) Assessment: Unchanged knees pain is resolvedDiscussed use of medications, application of heat or cold, and exercises.   Complete Medication List: 1)  Actonel 150 Mg Tabs (Risedronate sodium) .... One by mouth monthly as directed 2)  Xanax 0.25 Mg Tabs (Alprazolam) .... As needed 3)  Vitamin C 500 Mg Tabs (Ascorbic acid) .... As needed 4)  Chelated Zinc 23 Mg Lozg (Zinc) .... As needed 5)  Glucosamine 1500 Complex Caps (Glucosamine-chondroit-vit c-mn) .Marland Kitchen.. 1-3 once daily 6)  Chondroitin 1500  .... Once daily 7)  Amaryl 2 Mg Tabs (Glimepiride) .... Take one (1) by mouth once a day 8)  Vitamin D 53664 Unit Caps (Ergocalciferol) .... One by mouth weekly 9)  Onetouch Ultra Test Strp (Glucose blood) .... One qd, use one every day 10)  Benicar Hct 20-12.5 Mg Tabs (Olmesartan medoxomil-hctz) .... One by mouth daily   Patient Instructions: 1)  Please schedule a follow-up appointment in  CPX in DEC   Prescriptions: XANAX 0.25 MG  TABS (ALPRAZOLAM) as needed  #90 x 3   Entered and Authorized by:   Stacie Glaze MD   Signed by:   Stacie Glaze MD on 02/04/2008   Method used:   Print then Give to Patient   RxID:   [id number] VITAMIN D  50000 UNIT  CAPS (ERGOCALCIFEROL) one by mouth weekly  #5 x 5   Entered and Authorized by:   Stacie Glaze MD   Signed by:   Stacie Glaze MD on 02/04/2008   Method used:   Electronically to        [facility]  [phone]* (retail)       [address]       [place], [place]  [address]       Ph: [phone] or [phone]       Fax: [phone]   RxID:   [id number] AMARYL 2 MG TABS (GLIMEPIRIDE) Take one (1) by mouth once a day  #30 x 11  Entered and Authorized by:   Stacie Glaze MD   Signed by:   Stacie Glaze MD on 02/04/2008   Method used:   Electronically to        [facility]  [phone]* (retail)       [address]       [place], [place]  [address]       Ph: [phone] or [phone]       Fax: [phone]   RxID:   [id number]  ]

## 2010-05-19 NOTE — Assessment & Plan Note (Signed)
Summary: RECTAL BLEEDING/ABD PAIN.   History of Present Illness Visit Type: consult Primary GI MD: Yancey Flemings MD Primary Provider: Reynaldo Minium, MD Requesting Provider: Reynaldo Minium, MD Chief Complaint: rectal bleeding History of Present Illness:   77 year old female sent regarding rectal bleeding. She has undergone colonoscopy in the past. In 2001 she was found to have diverticulosis, AVMs, and a small hyperplastic polyp. Followup in August of 2006 revealed diverticulosis, AVMs, and internal hemorrhoids. No polyps. Followup in 5 years recommended.She does have a family history of colon cancer in multiple second degree relatives on her father's side. No colon cancer in first degree relatives. She reports to me that she had been having some abdominal bloating discomfort. However no change in bowel habits. On one occasion in December she noticed red blood per rectum on the tissue. No episodes since. Given her family history, she is concerned. She denies nausea, vomiting, or weight loss.   GI Review of Systems    Reports bloating.      Denies abdominal pain, acid reflux, belching, chest pain, dysphagia with liquids, dysphagia with solids, heartburn, loss of appetite, nausea, vomiting, vomiting blood, weight loss, and  weight gain.      Reports rectal bleeding.     Denies anal fissure, black tarry stools, change in bowel habit, constipation, diarrhea, diverticulosis, fecal incontinence, heme positive stool, hemorrhoids, irritable bowel syndrome, jaundice, light color stool, liver problems, and  rectal pain. Colonoscopy  Procedure date:  11/17/2004  Findings:      #1 complete exam to cecum with excellent prep #2. Left-sided diverticulosis #3. Angiodysplasia of the descending and transverse colon #4 no polyps    Prior Medications Reviewed Using: List Brought by Patient  Updated Prior Medication List: ACTONEL 150 MG  TABS (RISEDRONATE SODIUM) one by mouth monthly as directed XANAX  0.25 MG  TABS (ALPRAZOLAM) as needed VITAMIN C 500 MG  TABS (ASCORBIC ACID) as needed CHELATED ZINC 23 MG  LOZG (ZINC) as needed GLUCOSAMINE 1500 COMPLEX   CAPS (GLUCOSAMINE-CHONDROIT-VIT C-MN) 1-3 once daily * CHONDROITIN 1500 once daily AMARYL 2 MG TABS (GLIMEPIRIDE) Take one (1) by mouth once a day VITAMIN D 66440 UNIT  CAPS (ERGOCALCIFEROL) one by mouth weekly ONETOUCH ULTRA TEST  STRP (GLUCOSE BLOOD) one QD, use one every day BENICAR HCT 20-12.5 MG TABS (OLMESARTAN MEDOXOMIL-HCTZ) one by mouth daily  Current Allergies: No known allergies  Past Medical History:    Allergic rhinitis    Hypertension    Osteoporosis    Diabetes mellitus, type II    back pain    Osteoarthritis    Adenomatous Colon Polyps    Diverticulosis    Hyperlipidemia  Past Surgical History:    Reviewed history from 03/18/2008 and no changes required:        breast bx       Lumpectomy   Family History:    Family History Diabetes 1st degree relative    Family History Hypertension    Family History of Breast Cancer Maternal Aunts x2    Family History of Colon Cancer:Paternal Uncles x5    Family History of Heart Disease: Mother & Father  Social History:    Retired    Married    Patient has never smoked.     Alcohol Use - yes- rarely    Illicit Drug Use - no  Risk Factors:  Tobacco use:  never Drug use:  no Alcohol use:  yes  Colonoscopy History:     Date of Last Colonoscopy:  11/17/2004    Results:  #1 complete exam to cecum with excellent prep #2. Left-sided diverticulosis #3. Angiodysplasia of the descending and transverse colon #4 no polyps   Review of Systems       entirely negative review of systems  Vital Signs:  Patient Profile:   77 Years Old Female Height:     61 inches Weight:      155 pounds BMI:     29.39 Pulse rate:   72 / minute Pulse rhythm:   regular BP sitting:   120 / 58  (left arm)  Vitals Entered By: June McMurray CMA (May 26, 2008 3:32 PM)                   Physical Exam  General:     Well developed, well nourished, no acute distress. Head:     Normocephalic and atraumatic. Eyes:     PERRLA, no icterus. Nose:     No deformity, discharge,  or lesions. Mouth:     No deformity or lesions,   Lungs:     Clear throughout to auscultation. Heart:     Regular rate and rhythm; no murmurs, rubs,  or bruits. Abdomen:     Soft, nontender and nondistended. No masses, hepatosplenomegaly or hernias noted. Normal bowel sounds. Rectal:     deferred Msk:     Symmetrical with no gross deformities. Normal posture. Pulses:     Normal pulses noted. Extremities:     No clubbing, cyanosis, edema or deformities noted. Neurologic:     Alert and  oriented x4;  grossly normal neurologically. Skin:     Intact without significant lesions or rashes. Psych:     Alert and cooperative. Normal mood and affect.   Impression & Recommendations:  Problem # 1:  RECTAL BLEEDING (ICD-569.3) Minor isolated episode of rectal bleeding. Suspect hemorrhoid or fissure.  Plan: Given family history, number of years past since last colonoscopy, and anxiety, willl proceed with colonoscopy. The nature of the procedure as well as the risks, benefits, and alternatives have been reviewed in detail. She understood and agreed to proceed. Movi prep  Problem # 2:  FAMILY HX COLON CANCER (ICD-V16.0) multiple second degree relatives  Problem # 3:  ANGIODYSPLASIA-INTESTINE (ZOX-096.04) colonic angiodysplasia. This has not been a clinical problem.  Problem # 4:  FLATULENCE-GAS-BLOATING (ICD-787.3) transient bloating discomfort. Resolved. No further assessment if colonoscopy negative.  Problem # 5:  DIABETES MELLITUS, TYPE II (ICD-250.00) we will need to hold diabetic medications the day of the exam in order to avoid unwanted hypoglycemia. The patient has been instructed.  Problem # 6:  DIVERTICULOSIS, COLON (ICD-562.10) incidental. High-fiber diet  recommended.   Patient Instructions: 1)  Colonoscopy [facility] 06/09/08 3:30 2)  movi prep instructions given to patient. 3)  Movi prep Rx. sent to pharmacy. 4)  Hold Amaryl morning of procedure. 5)  Colonoscopy  brochure given. 6)  Copy to: Dr. Reynaldo Minium, Dr. Darryll Capers  Prescriptions: MOVIPREP 100 GM  SOLR (PEG-KCL-NACL-NASULF-NA ASC-C) As per prep instructions.  #1 x 0   Entered by:   Milford Cage CMA   Authorized by:   Hilarie Fredrickson MD   Signed by:   Milford Cage CMA on 05/26/2008   Method used:   Electronically to        [facility]  [phone]* (retail)       [address]       [place]  [place], [place]  [address]       Ph: [phone] or [phone]       Fax: [phone]   RxID:   [id number]

## 2010-05-19 NOTE — Assessment & Plan Note (Signed)
Summary: 2 month rov/njr   Vital Signs:  Patient Profile:   77 Years Old Female Height:     61 inches Weight:      154 pounds Temp:     98.5 degrees F oral Pulse rate:   76 / minute Resp:     14 per minute BP sitting:   130 / 78  (left arm)  Vitals Entered By: Willy Eddy, LPN (May 20, 2008 9:48 AM)                 Chief Complaint:  roa.  History of Present Illness: Asthma is stable, blood pressure is good with home readings better that the office readings The CBGs are good PP but sometime high in the AM 125  Diabetes Management History:      The patient is a 77 years old female who comes in for evaluation of DM Type 2.  She has not been enrolled in the "Diabetic Education Program".  She is checking home blood sugars.  She says that she is exercising.  Type of exercise includes: walking.  Duration of exercise is estimated to be 20 min.  She is doing this 3 times per week.        Her home fasting blood sugars are as follows: average: 120.    Hypertension History:      She denies headache, chest pain, palpitations, dyspnea with exertion, orthopnea, PND, peripheral edema, visual symptoms, neurologic problems, syncope, and side effects from treatment.        Positive major cardiovascular risk factors include female age 74 years old or older, diabetes, and hypertension.  Negative major cardiovascular risk factors include negative family history for ischemic heart disease and non-tobacco-user status.        Prior Medication List:  ACTONEL 150 MG  TABS (RISEDRONATE SODIUM) one by mouth monthly as directed XANAX 0.25 MG  TABS (ALPRAZOLAM) as needed VITAMIN C 500 MG  TABS (ASCORBIC ACID) as needed CHELATED ZINC 23 MG  LOZG (ZINC) as needed GLUCOSAMINE 1500 COMPLEX   CAPS (GLUCOSAMINE-CHONDROIT-VIT C-MN) 1-3 once daily * CHONDROITIN 1500 once daily AMARYL 2 MG TABS (GLIMEPIRIDE) Take one (1) by mouth once a day VITAMIN D 51884 UNIT  CAPS (ERGOCALCIFEROL) one by mouth  weekly ONETOUCH ULTRA TEST  STRP (GLUCOSE BLOOD) one QD, use one every day BENICAR HCT 20-12.5 MG TABS (OLMESARTAN MEDOXOMIL-HCTZ) one by mouth daily NYSTATIN 100000 UNIT/ML SUSP (NYSTATIN) 10 cc by mouth gargle and swallow three times a day for 14 days   Current Allergies (reviewed today): No known allergies   Past Medical History:    Reviewed history from 12/03/2006 and no changes required:       Allergic rhinitis       Hypertension       Osteoporosis       Diabetes mellitus, type II       back pain       Osteoarthritis  Past Surgical History:    Reviewed history from 03/18/2008 and no changes required:        breast bx       Lumpectomy   Family History:    Reviewed history from 03/19/2007 and no changes required:       Family History Diabetes 1st degree relative       Family History Hypertension  Social History:    Reviewed history from 02/01/2007 and no changes required:       Retired       Married  Review of Systems  The patient denies anorexia, fever, weight loss, weight gain, vision loss, decreased hearing, hoarseness, chest pain, syncope, dyspnea on exertion, peripheral edema, prolonged cough, headaches, hemoptysis, abdominal pain, melena, hematochezia, severe indigestion/heartburn, hematuria, incontinence, genital sores, muscle weakness, suspicious skin lesions, transient blindness, difficulty walking, depression, unusual weight change, abnormal bleeding, enlarged lymph nodes, and angioedema.         breast  lump   Physical Exam  General:     Well-developed,well-nourished,in no acute distress; alert,appropriate and cooperative throughout examination Eyes:     pupils equal and pupils round.   Nose:     no external deformity and no nasal discharge.   Mouth:     pharynx pink and moist and no exudates.   Neck:     supple and full ROM.   Lungs:     normal respiratory effort, no crackles, and no wheezes.   Heart:     normal rate and regular rhythm.     Abdomen:     Bowel sounds positive,abdomen soft and non-tender without masses, organomegaly or hernias noted. Msk:     normal ROM, no joint warmth, no redness over joints, and no joint deformities.      Impression & Recommendations:  Problem # 1:  DIABETES MELLITUS, TYPE II (ICD-250.00)  Her updated medication list for this problem includes:    Amaryl 2 Mg Tabs (Glimepiride) .Marland Kitchen... Take one (1) by mouth once a day    Benicar Hct 20-12.5 Mg Tabs (Olmesartan medoxomil-hctz) ..... One by mouth daily  Labs Reviewed: HgBA1c: 6.7 (03/11/2008)   Creat: 1.2 (03/11/2008)   Microalbumin: 1.1 (03/11/2008)  Last Eye Exam: Results: Normal.  (03/20/2008)  Orders: Venipuncture ([address]) TLB-BMP (Basic Metabolic Panel-BMET) (80048-METABOL) TLB-A1C / Hgb A1C (Glycohemoglobin) (83036-A1C)   Problem # 2:  OSTEOARTHRITIS (ICD-715.90) Discussed use of medications, application of heat or cold, and exercises.   Problem # 3:  OSTEOARTHRITIS (ICD-715.90) Discussed use of medications, application of heat or cold, and exercises.   Problem # 4:  ALLERGIC RHINITIS (ICD-477.9)  Complete Medication List: 1)  Actonel 150 Mg Tabs (Risedronate sodium) .... One by mouth monthly as directed 2)  Xanax 0.25 Mg Tabs (Alprazolam) .... As needed 3)  Vitamin C 500 Mg Tabs (Ascorbic acid) .... As needed 4)  Chelated Zinc 23 Mg Lozg (Zinc) .... As needed 5)  Glucosamine 1500 Complex Caps (Glucosamine-chondroit-vit c-mn) .Marland Kitchen.. 1-3 once daily 6)  Chondroitin 1500  .... Once daily 7)  Amaryl 2 Mg Tabs (Glimepiride) .... Take one (1) by mouth once a day 8)  Vitamin D 98119 Unit Caps (Ergocalciferol) .... One by mouth weekly 9)  Onetouch Ultra Test Strp (Glucose blood) .... One qd, use one every day 10)  Benicar Hct 20-12.5 Mg Tabs (Olmesartan medoxomil-hctz) .... One by mouth daily 11)  Nystatin 100000 Unit/ml Susp (Nystatin) .Marland Kitchen.. 10 cc by mouth gargle and swallow three times a day for 14 days  Diabetes Management  Assessment/Plan:      The following lipid goals have been established for the patient: Total cholesterol goal of 200; LDL cholesterol goal of 100; HDL cholesterol goal of 40; Triglyceride goal of 200.  Her blood pressure goal is < 130/80.    Hypertension Assessment/Plan:      The patient's hypertensive risk group is category C: Target organ damage and/or diabetes.  Her calculated 10 year risk of coronary heart disease is 13 %.  Today's blood pressure is 130/78.  Her blood pressure goal is < 130/80.  Patient Instructions: 1)  Please schedule a follow-up appointment in 3 months.

## 2010-05-19 NOTE — Letter (Signed)
Summary: Diabetic Eye Exam/[facility]  Diabetic Eye Exam/[facility]   Imported By: Maryln Gottron 03/24/2010 11:25:07  _____________________________________________________________________  External Attachment:    Type:   Image     Comment:   External Document

## 2010-05-19 NOTE — Assessment & Plan Note (Signed)
Summary: 1 mnth roa--smm   Vital Signs:  Patient Profile:   77 Years Old Female Height:     61 inches Weight:      152 pounds Temp:     98.5 degrees F rectal Pulse rate:   72 / minute Resp:     12 per minute BP sitting:   140 / 70  (left arm)  Vitals Entered By: Willy Eddy, LPN (March 19, 2007 12:02 PM)                 Chief Complaint:  roa/pt stopped glucophage stating"it makes me feel like my heart is beating out of chest".  History of Present Illness: stopped the metformin and the relafen  because she felt hard beats in chest... fast, short bursts.. not sustained, no chest pain no dizzyness. Mild HA  Diabetes Management History:      The patient is a 77 years old female who comes in for evaluation of DM Type 2.  She has not been enrolled in the "Diabetic Education Program".  She states understanding of dietary principles and is following her diet appropriately.  No sensory loss is reported.  Self foot exams are being performed.  She is checking home blood sugars.  She says that she is exercising.  Type of exercise includes: walking.  Duration of exercise is estimated to be 20 min.  She is doing this 3 times per week.        There are no symptoms to suggest diabetic complications.  Other questions/concerns include: stopped the meformin .  Since her last visit, no infections have occurred.  The following changes have been made to her treatment plan since last visit: medication changes.  Treatment plan changes were initiated by patient.  Dietary compliance is reported to be good.     Current Allergies (reviewed today): No known allergies  Updated/Current Medications (including changes made in today's visit):  BYSTOLIC 5 MG  TABS (NEBIVOLOL HCL) one by mouth daily ACTONEL 150 MG  TABS (RISEDRONATE SODIUM) one by mouth monthly as directed XANAX 0.25 MG  TABS (ALPRAZOLAM) as needed VITAMIN C 500 MG  TABS (ASCORBIC ACID) as needed CHELATED ZINC 23 MG  LOZG (ZINC) as  needed GLUCOSAMINE 1500 COMPLEX   CAPS (GLUCOSAMINE-CHONDROIT-VIT C-MN) 1-3 once daily * CHONDROITIN 1500 once daily GLUCOPHAGE XR 500 MG TB24 (METFORMIN HCL) HOLD!!!!!! AMARYL 2 MG TABS (GLIMEPIRIDE) Take one (1) by mouth once a day ULTRAM 50 MG  TABS (TRAMADOL HCL) one by mouth q 6 hours as needed knee pain   Past Medical History:    Reviewed history from 12/03/2006 and no changes required:       Allergic rhinitis       Hypertension       Osteoporosis       Diabetes mellitus, type II       back pain       Osteoarthritis  Past Surgical History:    Reviewed history from 12/03/2006 and no changes required:       Denies surgical history  escept breast bx   Family History:    Reviewed history and no changes required:       Family History Diabetes 1st degree relative       Family History Hypertension  Social History:    Reviewed history from 02/01/2007 and no changes required:       Retired       Married   Risk Factors:  Tobacco use:  never  Passive smoke exposure:  no Drug use:  no HIV high-risk behavior:  no Alcohol use:  yes    Drinks per day:  1    Has patient --       Felt need to cut down:  no       Been annoyed by complaints:  no       Felt guilty about drinking:  no       Needed eye opener in the morning:  no    Counseled to quit/cut down alcohol use:  no  Family History Risk Factors:    Family History of MI in females < 85 years old:  no    Family History of MI in males < 38 years old:  no   Review of Systems       palpitations   Physical Exam  General:     Well-developed,well-nourished,in no acute distress; alert,appropriate and cooperative throughout examination Head:     Normocephalic and atraumatic without obvious abnormalities. No apparent alopecia or balding. Mouth:     Oral mucosa and oropharynx without lesions or exudates.  Teeth in good repair. Neck:     No deformities, masses, or tenderness noted. Lungs:     Normal respiratory effort,  chest expands symmetrically. Lungs are clear to auscultation, no crackles or wheezes. Heart:     normal rate, regular rhythm, and Grade    early2/6 systolic ejection murmur.      Impression & Recommendations:  Problem # 1:  DIABETES MELLITUS, TYPE II (ICD-250.00)  Her updated medication list for this problem includes:    Glucophage Xr 500 Mg Tb24 (Metformin hcl) ..... Hold!!!!!!    Amaryl 2 Mg Tabs (Glimepiride) .Marland Kitchen... Take one (1) by mouth once a day  Labs Reviewed: HgBA1c: 7.0 (01/18/2007)   Creat: 0.9 (01/18/2007)      Problem # 2:  HYPERTENSION (ICD-401.9)  Her updated medication list for this problem includes:    Bystolic 5 Mg Tabs (Nebivolol hcl) ..... One by mouth daily  BP today: 140/70 Prior BP: 130/78 (02/01/2007)  Labs Reviewed: Creat: 0.9 (01/18/2007) Chol: 193 (01/18/2007)   HDL: 47.0 (01/18/2007)   LDL: 122 (01/18/2007)   TG: 121 (01/18/2007)   Problem # 3:  OSTEOARTHROSIS, LOCAL NOS, LOWER LEG (ICD-715.36)  Her updated medication list for this problem includes:    Ultram 50 Mg Tabs (Tramadol hcl) ..... One by mouth q 6 hours as needed knee pain   Complete Medication List: 1)  Bystolic 5 Mg Tabs (Nebivolol hcl) .... One by mouth daily 2)  Actonel 150 Mg Tabs (Risedronate sodium) .... One by mouth monthly as directed 3)  Xanax 0.25 Mg Tabs (Alprazolam) .... As needed 4)  Vitamin C 500 Mg Tabs (Ascorbic acid) .... As needed 5)  Chelated Zinc 23 Mg Lozg (Zinc) .... As needed 6)  Glucosamine 1500 Complex Caps (Glucosamine-chondroit-vit c-mn) .Marland Kitchen.. 1-3 once daily 7)  Chondroitin 1500  .... Once daily 8)  Glucophage Xr 500 Mg Tb24 (Metformin hcl) .... Hold!!!!!! 9)  Amaryl 2 Mg Tabs (Glimepiride) .... Take one (1) by mouth once a day 10)  Ultram 50 Mg Tabs (Tramadol hcl) .... One by mouth q 6 hours as needed knee pain  Diabetes Management Assessment/Plan:      The following lipid goals have been established for the patient: Total cholesterol goal of 200; LDL  cholesterol goal of 100; HDL cholesterol goal of 40; Triglyceride goal of 200.     Patient Instructions: 1)  Please schedule a  follow-up appointment in 1 month.    Prescriptions: ULTRAM 50 MG  TABS (TRAMADOL HCL) one by mouth q 6 hours as needed knee pain  #60 x 3   Entered and Authorized by:   Stacie Glaze MD   Signed by:   Stacie Glaze MD on 03/19/2007   Method used:   Electronically sent to ...       [facility]  [address]  [phone]*       [address]       [place], [place]  [address]       Ph: [phone] or [phone]       Fax: [phone]   RxID:   [id number] AMARYL 2 MG TABS (GLIMEPIRIDE) Take one (1) by mouth once a day  #30 x 11   Entered and Authorized by:   Stacie Glaze MD   Signed by:   Stacie Glaze MD on 03/19/2007   Method used:   Electronically sent to ...       [facility]  [address]  [phone]*       [address]       [place], [place]  [address]       Ph: [phone] or [phone]       Fax: [phone]   RxID:   [id number]  ]

## 2010-05-19 NOTE — Procedures (Signed)
Summary: Colonoscopy   Colonoscopy  Procedure date:  06/17/2008  Findings:      Location:  [facility].    Procedures Next Due Date:    Colonoscopy: 06/2013  COLONOSCOPY PROCEDURE REPORT  PATIENT:  Patricia, Moore  MR#:  [id number] BIRTHDATE:   05/14/1933   GENDER:   female  ENDOSCOPIST:   Wilhemina Bonito. Eda Keys, MD Referred by: Reynaldo Minium, M.D.  PROCEDURE DATE:  06/17/2008 PROCEDURE:  Colonoscopy with snare polypectomy ASA CLASS:   Class II INDICATIONS: rectal bleeding, history of colon cancer (multiple second degree relatives)  MEDICATIONS:    Fentanyl 75 mcg IV, Versed 6 mg IV  DESCRIPTION OF PROCEDURE:   After the risks benefits and alternatives of the procedure were thoroughly explained, informed consent was obtained.  Digital rectal exam was performed and revealed no abnormalities.   The LB [id number] endoscope was introduced through the anus and advanced to the cecum, which was identified by both the appendix and ileocecal valve, without limitations. TIME TO CECUM = 3:53 MIN The quality of the prep was excellent, using MoviPrep.  The instrument was then withdrawn (TIME = 9:02 MIN) as the colon was fully examined. <<PROCEDUREIMAGES>>            <<OLD IMAGES>>  FINDINGS:  Two polyps  were found in the ascending colon (2mm, 3mm). Polyps were snared without cautery. Retrieval was successful.   Moderate diverticulosis was found in the sigmoid colon.   Retroflexed views in the rectum revealed no abnormalities.    The scope was then withdrawn from the patient and the procedure completed.  COMPLICATIONS:   None  ENDOSCOPIC IMPRESSION:  1) Two polyps in the ascending colon - removed  2) Moderate diverticulosis in the sigmoid colon  3) Normal colonoscopy otherwise  RECOMMENDATIONS:  1) Repeat colonoscopy in 5 years if polyp adenomatous; otherwise 10 years    _______________________________ Wilhemina Bonito. Eda Keys, MD  CC: Stacie Glaze MD;  Reynaldo Minium; Patient     REPORT OF SURGICAL PATHOLOGY   Case #: [id number] Patient Name: Patricia Moore, Patricia Moore. Office Chart Number:  [id number]   MRN: [id number] Pathologist: Alden Server A. Delila Spence, MD DOB/Age  01/15/1934 (Age: 77)    Gender: F Date Taken:  06/17/2008 Date Received: 06/17/2008   FINAL DIAGNOSIS   ***MICROSCOPIC EXAMINATION AND DIAGNOSIS***   COLON, ASCENDING, POLYP:  -  ONE ADENOMATOUS POLYP AND ONE HYPERPLASTIC POLYP. -  NO HIGH GRADE DYSPLASIA OR INVASIVE MALIGNANCY IDENTIFIED.   mw Date Reported:  06/18/2008     Alden Server A. Delila Spence, MD *** Electronically Signed Out By EAA ***    July 03, 2008 MRN: [id number]    [facility] [address] [place], [place]  [address]    Dear [name],  I am pleased to inform you that the colon polyp(s) removed during your recent colonoscopy was (were) found to be benign (no cancer detected) upon pathologic examination.  I recommend you have a repeat colonoscopy examination in 5 years to look for recurrent polyps, as having colon polyps increases your risk for having recurrent polyps or even colon cancer in the future.  Should you develop new or worsening symptoms of abdominal pain, bowel habit changes or bleeding from the rectum or bowels, please schedule an evaluation with either your primary care physician or with me.  Additional information/recommendations:  _X_ No further action with gastroenterology is needed at this time. Please      follow-up with your primary care physician for your other  healthcare      needs.  Please call us if you are having persistent problems or have questions about your condition that have not been fully answered at this time.  Sincerely,  Hilarie Fredrickson MD  This letter has been electronically signed by your physician.   Signed by Hilarie Fredrickson MD on 07/03/2008 at 9:02 AM   This report was created from the original endoscopy report, which was reviewed and signed by the above listed endoscopist.

## 2010-05-19 NOTE — Assessment & Plan Note (Signed)
Summary: 3 month roa//lh   Vital Signs:  Patient profile:   77 year old female Height:      61 inches Weight:      150 pounds BMI:     28.44 Temp:     98.2 degrees F rectal Pulse rate:   76 / minute Resp:     14 per minute BP sitting:   154 / 80  (left arm)  Vitals Entered By: Willy Eddy, LPN (March 05, 2009 10:08 AM)  Primary Care Provider:  Stacie Glaze MD  CC:  contines to c/o legs swelling.  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) 2)  Edema  (ICD-782.3) 3)  Chest Pain, Atypical  (ICD-786.59) 4)  Rectal Bleeding  (ICD-569.3) 5)  Angiodysplasia-intestine  (ICD-569.84) 6)  Family Hx Colon Cancer  (ICD-V16.0) 7)  Flatulence-gas-bloating  (ICD-787.3) 8)  Diverticulosis, Colon  (ICD-562.10) 9)  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 One By Mouth Monthly As Directed 2)  Alprazolam 0.25 Mg Tbdp (Alprazolam) .... One By Mouth Three Times A Day  As Needed 3)  Vitamin C 500 Mg  Tabs (Ascorbic Acid) .... As Needed 4)  Chelated Zinc 23 Mg  Lozg (Zinc) .... As  Needed 5)  Glucosamine 1500 Complex   Caps (Glucosamine-Chondroit-Vit C-Mn) .Marland Kitchen.. 1-3 Once Daily 6)  Chondroitin 1500 .... Once Daily 7)  Amaryl 2 Mg Tabs (Glimepiride) .... Take One (1) By Mouth Once A Day 8)  Vitamin D 81191 Unit  Caps (Ergocalciferol) .... One By Mouth Weekly 9)  Onetouch Ultra Test  Strp (Glucose Blood) .... Test Two Times A Day As Directed 10)  Benicar Hct 40-12.5 Mg Tabs (Olmesartan Medoxomil-Hctz) .... One By Mouth Daily 11)  Lasix 20 Mg Tabs (Furosemide) .... One By Mouth Daily  Current Medications (verified): 1)  Actonel 150 Mg  Tabs (Risedronate Sodium) .... One By Mouth Monthly As Directed 2)  Alprazolam 0.25 Mg Tbdp (Alprazolam) .... One By Mouth Three Times A Day  As Needed 3)  Vitamin C 500 Mg  Tabs (Ascorbic Acid) .... As Needed 4)  Chelated Zinc 23 Mg  Lozg (Zinc) .... As Needed 5)  Glucosamine 1500 Complex   Caps (Glucosamine-Chondroit-Vit C-Mn) .Marland Kitchen.. 1-3 Once Daily 6)  Chondroitin 1500 .... Once Daily 7)  Glucovance 2.5-500 Mg Tabs (Glyburide-Metformin) .... One By Mouth Bid 8)  Vitamin D 47829 Unit  Caps (Ergocalciferol) .... One By Mouth Weekly 9)  Onetouch Ultra Test  Strp (Glucose Blood) .... Test Two Times A Day As Directed 10)  Benicar 40 Mg Tabs (Olmesartan Medoxomil) .... One By Mouth Dailly 11)  Lasix 20  Mg Tabs (Furosemide) .... One By Mouth Daily 12)  Nystatin 100000 Unit/ml Susp (Nystatin) .Marland Kitchen.. 10 Cc Gargle and Swalloww Three Times A Day Prn 13)  One Touch Delica Lancets  Misc (Lancets) .... Use As Directed  Allergies (verified): No Known Drug Allergies  Past History:  Family History: Last updated: 05/26/2008 Family History Diabetes 1st degree relative Family History Hypertension Family History of Breast Cancer Maternal Aunts x2 Family History of Colon Cancer:Paternal Uncles x5 Family History of Heart Disease: Mother & Father  Social History: Last updated: 05/26/2008 Retired Married Patient has never smoked.  Alcohol Use - yes-  rarely Illicit Drug Use - no  Risk Factors: Alcohol Use: 1 (03/19/2007) Exercise: yes (02/01/2007)  Risk Factors: Smoking Status: never (03/05/2009) Passive Smoke Exposure: no (03/19/2007)  Past medical, surgical, family and social histories (including risk factors) reviewed, and no changes noted (except as noted below).  Past Medical History: Reviewed history from 05/26/2008 and no changes required. Allergic rhinitis Hypertension Osteoporosis Diabetes mellitus, type II back pain Osteoarthritis Adenomatous Colon Polyps Diverticulosis Hyperlipidemia  Past Surgical History: Reviewed history from 03/18/2008 and no changes required.  breast bx Lumpectomy  Family History: Reviewed history from 05/26/2008 and no changes required. Family History Diabetes 1st degree relative Family History Hypertension Family History of Breast Cancer Maternal Aunts x2 Family History of Colon Cancer:Paternal Uncles x5 Family History of Heart Disease: Mother & Father  Social History: Reviewed history from 05/26/2008 and no changes required. Retired Married Patient has never smoked.  Alcohol Use - yes- rarely Illicit Drug Use - no  Review of Systems  The patient denies anorexia, fever, weight loss, weight gain, vision loss, decreased hearing, hoarseness, chest pain, syncope, dyspnea on exertion, peripheral edema, prolonged cough, headaches, hemoptysis, abdominal pain, melena, hematochezia, severe indigestion/heartburn, hematuria, incontinence, genital sores, muscle weakness, suspicious skin lesions, transient blindness, difficulty walking, depression, unusual weight change, abnormal bleeding, enlarged lymph nodes, angioedema, and breast masses.    Physical Exam  General:  Well developed, well nourished, no acute distress. Head:  Normocephalic and atraumatic. Eyes:  pupils equal and pupils round.   Ears:  R ear normal and L ear normal.   Nose:  No deformity, discharge,  or lesions. Mouth:   No deformity or lesions,     Impression & Recommendations:  Problem # 1:  VARICOSE VEINS LOWER EXTREMITIES W/OTH COMPS (ICD-454.8) THE EITIOLOGY OF THE EDEMA IS VERICOSE VIENS Orders: Durable Medical Equipment (DME)  Problem # 2:  DIABETES MELLITUS, TYPE II (ICD-250.00) POORLY CONTROLLED ADD THE GLUCOVANCE Her updated medication list for this problem includes:    Glucovance 2.5-500 Mg Tabs (Glyburide-metformin) ..... One by mouth bid    Benicar 40 Mg Tabs (Olmesartan medoxomil) ..... One by mouth dailly  Problem # 3:  EDEMA (ICD-782.3)  Her updated medication list for this problem includes:    Lasix 20 Mg Tabs (Furosemide) ..... One by mouth daily  Orders: Durable Medical Equipment (DME)  Discussed elevation of the legs, use of compression stockings, sodium restiction, and medication use.   Problem # 4:  HYPERTENSION (ICD-401.9)  Her updated medication list for this problem includes:    Benicar 40 Mg Tabs (Olmesartan medoxomil) ..... One by mouth dailly    Lasix 20 Mg Tabs (Furosemide) ..... One by mouth daily  BP today: 154/80 Prior BP: 130/80 (12/02/2008)  Prior 10 Yr Risk Heart Disease: 13 % (05/20/2008)  Labs Reviewed: K+: 4.5 (11/02/2008) Creat: : 1.1 (11/02/2008)   Chol: 222 (03/11/2008)   HDL: 62.3 (03/11/2008)  LDL: DEL (03/11/2008)   TG: 87 (03/11/2008)  Complete Medication List: 1)  Actonel 150 Mg Tabs (Risedronate sodium) .... One by mouth monthly as directed 2)  Alprazolam 0.25 Mg Tbdp (Alprazolam) .... One by mouth three times a day  as needed 3)  Vitamin C 500 Mg Tabs (Ascorbic acid) .... As needed 4)  Chelated Zinc 23 Mg Lozg (Zinc) .... As needed 5)  Glucosamine 1500 Complex Caps (Glucosamine-chondroit-vit c-mn) .Marland Kitchen.. 1-3 once daily 6)  Chondroitin 1500  .... Once daily 7)  Glucovance 2.5-500 Mg Tabs (Glyburide-metformin) .... One by mouth bid 8)  Vitamin D [address] Unit Caps (Ergocalciferol) .... One by mouth weekly 9)  Onetouch Ultra Test Strp  (Glucose blood) .... Test two times a day as directed 10)  Benicar 40 Mg Tabs (Olmesartan medoxomil) .... One by mouth dailly 11)  Lasix 20 Mg Tabs (Furosemide) .... One by mouth daily 12)  Nystatin 100000 Unit/ml Susp (Nystatin) .Marland Kitchen.. 10 cc gargle and swalloww three times a day prn 13)  One Touch Delica Lancets Misc (Lancets) .... Use as directed  Patient Instructions: 1)  Please schedule a follow-up appointment in 2 months. Prescriptions: ONE TOUCH DELICA LANCETS  MISC (LANCETS) use as directed  #100 x 6   Entered and Authorized by:   Stacie Glaze MD   Signed by:   Stacie Glaze MD on 03/05/2009   Method used:   Electronically to        [facility]  [phone]* (retail)       [address]       [place], [place]  [address]       Ph: [phone] or [phone]       Fax: [phone]   RxID:   [id number] NYSTATIN 100000 UNIT/ML SUSP (NYSTATIN) 10 cc gargle and swalloww three times a day prn  #300cc x 2   Entered and Authorized by:   Stacie Glaze MD   Signed by:   Stacie Glaze MD on 03/05/2009   Method used:   Electronically to        [facility]  [phone]* (retail)       [address]       [place], [place]  [address]       Ph: [phone] or [phone]       Fax: [phone]   RxID:   [id number] GLUCOVANCE 2.5-500 MG TABS (GLYBURIDE-METFORMIN) one by mouth BID  #60 x 11   Entered and Authorized by:   Stacie Glaze MD   Signed by:   Stacie Glaze MD on 03/05/2009   Method used:   Electronically to        [facility]  [phone]* (retail)       [address]       [place], [place]  [address]       Ph: [phone] or [phone]       Fax: [phone]   RxID:   [id number] LASIX 20 MG TABS (FUROSEMIDE) one by mouth daily  #90 x 3   Entered and Authorized by:   Stacie Glaze MD   Signed by:   Stacie Glaze MD on 03/05/2009   Method used:    Electronically to        [facility]  [phone]* (retail)  [address]       [place], [place]  [address]       Ph: [phone] or [phone]       Fax: [phone]   RxID:   [id number] BENICAR 40 MG TABS (OLMESARTAN MEDOXOMIL) one by mouth dailly  #30 x 11   Entered and Authorized by:   Stacie Glaze MD   Signed by:   Stacie Glaze MD on 03/05/2009   Method used:   Electronically to        [facility]  [phone]* (retail)       [address]       [place], [place]  [address]       Ph: [phone] or [phone]       Fax: [phone]   RxID:   [id number]

## 2010-05-19 NOTE — Assessment & Plan Note (Signed)
Summary: ?spider bite?/dm   Vital Signs:  Patient profile:   77 year old female Height:      61 inches (154.94 cm) Weight:      151 pounds (68.64 kg) O2 Sat:      97 % on Room air Temp:     98.3 degrees F (36.83 degrees C) oral Pulse rate:   70 / minute BP sitting:   130 / 62  (left arm) Cuff size:   regular  Vitals Entered By: Josph Macho RMA (December 03, 2009 2:46 PM)  O2 Flow:  Room air CC: Possible spider bite on front of neck x 1 week/ CF Is Patient Diabetic? Yes   History of Present Illness: Patient in today for evaluation of a rash on her anterior neck for about a week. She believes she got an insect bite but did not see the insect. It has been itchy and over the past week is has gotten swollen and more itchy. Mildly painful, no discharge or trouble swallowing or breathing. No CP/palp/SOB/f/c/malaise/GI or GU c/o.  Current Medications (verified): 1)  Alprazolam 0.25 Mg Tbdp (Alprazolam) .... One By Mouth Three Times A Day  As Needed 2)  Vitamin C 500 Mg  Tabs (Ascorbic Acid) .... As Needed 3)  Chelated Zinc 23 Mg  Lozg (Zinc) .... As Needed 4)  Glucovance 2.5-500 Mg Tabs (Glyburide-Metformin) .... One By Mouth Bid 5)  Vitamin D [address] Unit  Caps (Ergocalciferol) .... One By Mouth Weekly 6)  Onetouch Ultra Test  Strp (Glucose Blood) .... Test Two Times A Day As Directed 7)  Benicar 40 Mg Tabs (Olmesartan Medoxomil) .... One By Mouth Dailly 8)  Lasix 20 Mg Tabs (Furosemide) .... One By Mouth Daily 9)  Nystatin 100000 Unit/ml Susp (Nystatin) .Marland Kitchen.. 10 Cc Gargle and Swalloww Three Times A Day Prn 10)  One Touch Delica Lancets  Misc (Lancets) .... Use As Directed  Allergies (verified): No Known Drug Allergies  Past History:  Past medical history reviewed for relevance to current acute and chronic problems. Social history (including risk factors) reviewed for relevance to current acute and chronic problems.  Past Medical History: Reviewed history from 05/26/2008 and no  changes required. Allergic rhinitis Hypertension Osteoporosis Diabetes mellitus, type II back pain Osteoarthritis Adenomatous Colon Polyps Diverticulosis Hyperlipidemia  Social History: Reviewed history from 05/26/2008 and no changes required. Retired Married Patient has never smoked.  Alcohol Use - yes- rarely Illicit Drug Use - no  Review of Systems      See HPI  Physical Exam  General:  Well-developed,well-nourished,in no acute distress; alert,appropriate and cooperative throughout examination Head:  Normocephalic and atraumatic without obvious abnormalities. No apparent alopecia or balding. Eyes:  No corneal or conjunctival inflammation noted. EOMI. Perrla. Funduscopic exam benign, without hemorrhages, exudates or papilledema. Vision grossly normal. Mouth:  Oral mucosa and oropharynx without lesions or exudates.  Teeth in good repair. Neck:  No deformities, masses, or tenderness noted. Lungs:  Normal respiratory effort, chest expands symmetrically. Lungs are clear to auscultation, no crackles or wheezes. Heart:  Normal rate and regular rhythm. S1 and S2 normal without gallop, murmur, click, rub or other extra sounds. Abdomen:  Bowel sounds positive,abdomen soft and non-tender without masses, organomegaly or hernias noted. Extremities:  No clubbing, cyanosis, edema, or deformity noted with normal full range of motion of all joints.   Skin:  erythema and fluctuance noted over 3-4 cm patch of anterior neck. 1 cm patch in center is mildly hard. No pustular componenet Psych:  Cognition and judgment appear intact. Alert and cooperative with normal attention span and concentration. No apparent delusions, illusions, hallucinations   Impression & Recommendations:  Problem # 1:  CELLULITIS AND ABSCESS OF UNSPECIFIED SITE (ICD-682.9)  Her updated medication list for this problem includes:    Keflex 500 Mg Caps (Cephalexin) .Marland Kitchen... 1 cap by mouth 4 x daily x 10day Cleanse with  [facility] Astringent daily for any insect bites in future try an antihistamine daily   Problem # 2:  HYPERTENSION (ICD-401.9)  Her updated medication list for this problem includes:    Benicar 40 Mg Tabs (Olmesartan medoxomil) ..... One by mouth dailly    Lasix 20 Mg Tabs (Furosemide) ..... One by mouth daily Well controlled on current meds.  Problem # 3:  DIABETES MELLITUS, TYPE II (ICD-250.00)  Her updated medication list for this problem includes:    Glucovance 2.5-500 Mg Tabs (Glyburide-metformin) ..... One by mouth bid    Benicar 40 Mg Tabs (Olmesartan medoxomil) ..... One by mouth dailly No recent spikes in numbers, sent in refills of Lancets, last batch did not fit her machine  Complete Medication List: 1)  Alprazolam 0.25 Mg Tbdp (Alprazolam) .... One by mouth three times a day  as needed 2)  Vitamin C 500 Mg Tabs (Ascorbic acid) .... As needed 3)  Chelated Zinc 23 Mg Lozg (Zinc) .... As needed 4)  Glucovance 2.5-500 Mg Tabs (Glyburide-metformin) .... One by mouth bid 5)  Vitamin D 62130 Unit Caps (Ergocalciferol) .... One by mouth weekly 6)  Onetouch Ultra Test Strp (Glucose blood) .... Test two times a day as directed 7)  Benicar 40 Mg Tabs (Olmesartan medoxomil) .... One by mouth dailly 8)  Lasix 20 Mg Tabs (Furosemide) .... One by mouth daily 9)  Nystatin 100000 Unit/ml Susp (Nystatin) .Marland Kitchen.. 10 cc gargle and swalloww three times a day prn 10)  Onetouch Ultrasoft Lancets Misc (Lancets) .... Check blood sugars as directed 11)  Keflex 500 Mg Caps (Cephalexin) .Marland Kitchen.. 1 cap by mouth 4 x daily x 10day  Patient Instructions: 1)  Take your antibiotic as prescribed until ALL of it is gone, but stop if you develop a rash or swelling and contact our office as soon as possible.  2)  Please schedule a follow-up appointment as needed if symptoms worsen or do not improve.Cleanse area daily with [facility] Astringent and use after any insect bite. 3)  Take a probiotic like Align daily for a  month whenever you take an antibiotic Prescriptions: KEFLEX 500 MG CAPS (CEPHALEXIN) 1 cap by mouth 4 x daily x 10day  #40 x 0   Entered and Authorized by:   Danise Edge MD   Signed by:   Danise Edge MD on 12/03/2009   Method used:   Electronically to        [facility]  [phone]* (retail)       [address]       [place], [place]  [address]       Ph: [phone] or [phone]       Fax: [phone]   RxID:   [id number] ONETOUCH ULTRASOFT LANCETS  MISC (LANCETS) Check blood sugars as directed  #3 boxes x 1   Entered and Authorized by:   Danise Edge MD   Signed by:   Danise Edge MD on 12/03/2009   Method used:   Electronically to        [facility]  [address]  [phone]* (retail)       [address]       [place], [place]  [address]       Ph: [phone] or [phone]       Fax: [phone]   RxID:   [id number]

## 2010-05-19 NOTE — Assessment & Plan Note (Signed)
Summary: 3 month fup//ccm   Vital Signs:  Patient profile:   77 year old female Height:      61 inches Weight:      152 pounds BMI:     28.82 Temp:     98.2 degrees F oral Pulse rate:   72 / minute Resp:     14 per minute BP sitting:   136 / 74  (left arm)  Vitals Entered By: Willy Eddy, LPN (November 02, 2009 2:51 PM) CC: roa- continues to c/o back pain with certain movement of back, Hypertension Management   Primary Care Provider:  Stacie Glaze MD  CC:  roa- continues to c/o back pain with certain movement of back and Hypertension Management.  History of Present Illness: blood pressure stable DM she states tha CBGs are 100 range has persistant back pain more in the left that the right has be to surgeon for consult has never had any epidural shots the pain is worse in te middle of the back had xrays in the back and ribs 3 months ago  Hypertension History:      She denies headache, chest pain, palpitations, dyspnea with exertion, orthopnea, PND, peripheral edema, visual symptoms, neurologic problems, syncope, and side effects from treatment.        Positive major cardiovascular risk factors include female age 65 years old or older, diabetes, hyperlipidemia, and hypertension.  Negative major cardiovascular risk factors include negative family history for ischemic heart disease and non-tobacco-user status.     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) 7)  Edema  (ICD-782.3) 8)  Chest Pain, Atypical  (ICD-786.59) 9)  Rectal  Bleeding  (ICD-569.3) 10)  Angiodysplasia-intestine  (ICD-569.84) 11)  Family Hx Colon Cancer  (ICD-V16.0) 12)  Flatulence-gas-bloating  (ICD-787.3) 13)  Diverticulosis, Colon  (ICD-562.10) 14)  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) 15)  Cough Variant Asthma  (ICD-493.82) 16)  Dermatophytosis of Nail  (ICD-110.1) 17)  Bronchitis, Acute  (ICD-466.0) 18)  Influenza  (ICD-487.8) 19)  Family History Diabetes 1st Degree Relative  (ICD-V18.0) 20)  Preventive Health Care  (ICD-V70.0) 21)  Osteoarthrosis, Local Nos, Lower Leg  (ICD-715.36) 22)  Osteoarthritis  (ICD-715.90) 23)  Back Pain  (ICD-724.5) 24)  Diabetes Mellitus, Type II  (ICD-250.00) 25)  Osteoporosis  (ICD-733.00) 26)  Hypertension  (ICD-401.9) 27)  Allergic Rhinitis  (ICD-477.9)  Current Problems (verified): 1)  Hyperlipidemia, Mild  (ICD-272.4) 2)  Cellulitis and Abscess of Unspecified Site  (ICD-682.9) 3)  Carcinoma, Skin, Squamous Cell, Face  (ICD-173.3) 4)  Diverticulitis of Colon  (ICD-562.11) 5)  Varicose Veins Lower Extremities W/oth Comps  (ICD-454.8) 6)  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) 7)  Edema  (ICD-782.3) 8)  Chest Pain, Atypical  (ICD-786.59) 9)  Rectal Bleeding  (ICD-569.3) 10)  Angiodysplasia-intestine  (ICD-569.84) 11)  Family Hx Colon Cancer  (ICD-V16.0) 12)  Flatulence-gas-bloating  (ICD-787.3) 13)  Diverticulosis, Colon  (ICD-562.10) 14)  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 One By Mouth Three Times A Day  As Needed 2)  Vitamin C 500 Mg  Tabs (Ascorbic Acid) .... As Needed 3)  Chelated Zinc 23 Mg  Lozg (Zinc) .... As Needed 4)  Glucovance 2.5-500 Mg Tabs (Glyburide-Metformin) .... One By Mouth Bid 5)  Vitamin D 95621 Unit  Caps (Ergocalciferol) .... One By Mouth Weekly 6)  Onetouch Ultra Test  Strp (Glucose Blood) .... Test Two Times A Day As Directed 7)  Benicar 40 Mg Tabs (Olmesartan Medoxomil) .... One By Mouth Dailly 8)  Lasix 20 Mg Tabs (Furosemide) .... One By Mouth Daily 9)  Nystatin 100000 Unit/ml Susp (Nystatin) .Marland Kitchen.. 10 Cc Gargle and Swalloww Three Times A Day Prn 10)  One Touch Delica Lancets  Misc (Lancets) .... Use As Directed  Current Medications (verified): 1)  Alprazolam 0.25 Mg Tbdp (Alprazolam) .... One By Mouth Three Times A Day  As Needed 2)  Vitamin C 500 Mg  Tabs (Ascorbic Acid) .... As Needed 3)  Chelated Zinc 23 Mg  Lozg (Zinc) .... As Needed 4)  Glucovance 2.5-500 Mg Tabs (Glyburide-Metformin) .... One By Mouth Bid 5)  Vitamin D 30865 Unit  Caps (Ergocalciferol) .... One By Mouth Weekly 6)  Onetouch Ultra Test  Strp (Glucose Blood) .... Test Two Times A Day As Directed 7)  Benicar 40 Mg Tabs (Olmesartan Medoxomil) .... One By Mouth Dailly 8)  Lasix 20 Mg Tabs (Furosemide) .... One By Mouth Daily 9)  Nystatin 100000 Unit/ml Susp (Nystatin) .Marland Kitchen.. 10 Cc Gargle and Swalloww Three Times A Day Prn 10)  One Touch Delica Lancets  Misc (Lancets) .... Use As Directed  Allergies (verified): No Known Drug Allergies  Past History:  Family History: Last updated: 05/26/2008 Family History Diabetes 1st degree relative Family History Hypertension Family History of Breast Cancer Maternal Aunts x2 Family History of Colon Cancer:Paternal Uncles x5 Family History of Heart Disease: Mother & Father  Social History: Last updated:  05/26/2008 Retired Married Patient has never smoked.  Alcohol Use - yes- rarely Illicit Drug Use - no  Risk Factors: Alcohol Use: 1 (11/02/2009) Exercise: yes (02/01/2007)  Risk Factors: Smoking Status: never (11/02/2009) Passive Smoke Exposure: no (11/02/2009)  Past medical, surgical, family and social histories (including risk factors) reviewed, and no changes noted (except as noted below).  Past Medical History: Reviewed history from 05/26/2008 and no changes required. Allergic rhinitis Hypertension Osteoporosis Diabetes mellitus, type II back pain Osteoarthritis Adenomatous Colon Polyps Diverticulosis Hyperlipidemia  Past Surgical History: Reviewed history from 03/18/2008 and no changes required.  breast bx Lumpectomy  Family History: Reviewed history from 05/26/2008 and no changes required. Family History Diabetes 1st degree relative Family History Hypertension Family History of Breast Cancer Maternal Aunts x2 Family History of Colon Cancer:Paternal Uncles x5 Family History of Heart Disease: Mother & Father  Social History: Reviewed history from 05/26/2008 and no changes required. Retired Married Patient has never smoked.  Alcohol Use - yes- rarely Illicit Drug Use - no  Review of Systems  The patient denies anorexia, fever,  weight loss, weight gain, vision loss, decreased hearing, hoarseness, chest pain, syncope, dyspnea on exertion, peripheral edema, prolonged cough, headaches, hemoptysis, abdominal pain, melena, hematochezia, severe indigestion/heartburn, hematuria, incontinence, genital sores, muscle weakness, suspicious skin lesions, transient blindness, difficulty walking, depression, unusual weight change, abnormal bleeding, enlarged lymph nodes, angioedema, and breast masses.    Physical Exam  General:  Well developed, well nourished, no acute distress. Eyes:  pupils equal and pupils round.   Ears:  R ear normal and L ear normal.   Nose:  No  deformity, discharge,  or lesions. Mouth:  No deformity or lesions,   Neck:  supple and full ROM.   Lungs:  Clear throughout to auscultation. Heart:  Regular rate and rhythm; no murmurs, rubs,  or bruits. Abdomen:  soft, non-tender, and RLQ tenderness.   Extremities:  trace left pedal edema and trace right pedal edema.   Neurologic:  alert & oriented X3 and finger-to-nose normal.     Impression & Recommendations:  Problem # 1:  BACK PAIN (ICD-724.5) worsening chronic pain Discussed use of moist heat or ice, modified activities, medications, and stretching/strengthening exercises. Back care instructions given. To be seen in 2 weeks if no improvement; sooner if worsening of symptoms.   Orders: T-Lumbar Spine Complete, 5 Views [phone]) T-Thoracic Spine 2 Views [phone])  Problem # 2:  DIABETES MELLITUS, TYPE II (ICD-250.00) Assessment: Unchanged stable CBG's check A1c Her updated medication list for this problem includes:    Glucovance 2.5-500 Mg Tabs (Glyburide-metformin) ..... One by mouth bid    Benicar 40 Mg Tabs (Olmesartan medoxomil) ..... One by mouth dailly  Labs Reviewed: Creat: 1.0 (03/29/2009)     Last Eye Exam: Results: Normal.  (03/20/2008) Reviewed HgBA1c results: 6.3 (07/28/2009)  6.8 (03/29/2009)  Orders: TLB-BMP (Basic Metabolic Panel-BMET) (80048-METABOL) TLB-A1C / Hgb A1C (Glycohemoglobin) (83036-A1C) Venipuncture (01027)  Problem # 3:  HYPERTENSION (ICD-401.9) Assessment: Unchanged  Her updated medication list for this problem includes:    Benicar 40 Mg Tabs (Olmesartan medoxomil) ..... One by mouth dailly    Lasix 20 Mg Tabs (Furosemide) ..... One by mouth daily  BP today: 136/74 Prior BP: 140/80 (08/04/2009)  10 Yr Risk Heart Disease: 13 % Prior 10 Yr Risk Heart Disease: 17 % (08/04/2009)  Labs Reviewed: K+: 5.3 (03/29/2009) Creat: : 1.0 (03/29/2009)   Chol: 204 (07/28/2009)   HDL: 62.40 (07/28/2009)   LDL: 126 (03/29/2009)   TG: 116.0  (07/28/2009)  Complete Medication List: 1)  Alprazolam 0.25 Mg Tbdp (Alprazolam) .... One by mouth three times a day  as needed 2)  Vitamin C 500 Mg Tabs (Ascorbic acid) .... As needed 3)  Chelated Zinc 23 Mg Lozg (Zinc) .... As needed 4)  Glucovance 2.5-500 Mg Tabs (Glyburide-metformin) .... One by mouth bid 5)  Vitamin D 25366 Unit Caps (Ergocalciferol) .... One by mouth weekly 6)  Onetouch Ultra Test Strp (Glucose blood) .... Test two times a day as directed 7)  Benicar 40 Mg Tabs (Olmesartan medoxomil) .... One by mouth dailly 8)  Lasix 20 Mg Tabs (Furosemide) .... One by mouth daily 9)  Nystatin 100000 Unit/ml Susp (Nystatin) .Marland Kitchen.. 10 cc gargle and swalloww three times a day prn 10)  One Touch Delica Lancets Misc (Lancets) .... Use as directed  Hypertension Assessment/Plan:      The patient's hypertensive risk group is category C: Target organ damage and/or diabetes.  Her calculated 10 year risk of coronary heart disease is 13 %.  Today's blood pressure is 136/74.  Her  blood pressure goal is < 130/80.  Patient Instructions: 1)  Please schedule a follow-up appointment in 3 months. Prescriptions: ALPRAZOLAM 0.25 MG TBDP (ALPRAZOLAM) one by mouth three times a day  as needed  #90 x 3   Entered by:   Willy Eddy, LPN   Authorized by:   Stacie Glaze MD   Signed by:   Willy Eddy, LPN on 82/95/6213   Method used:   Telephoned to ...       [facility]  [address]  [phone]* (retail)       [address]       [place], [place]  [address]       Ph: [phone] or [phone]       Fax: [phone]   RxID:   [id number] LASIX 20 MG TABS (FUROSEMIDE) one by mouth daily  #90 x 3   Entered by:   Willy Eddy, LPN   Authorized by:   Stacie Glaze MD   Signed by:   Willy Eddy, LPN on 88/41/6606   Method used:   Electronically to        [facility]  [phone]* (retail)       [address]       [place], [place]  [address]       Ph: [phone] or [phone]       Fax: [phone]   RxID:   [id number] VITAMIN D 50000 UNIT  CAPS (ERGOCALCIFEROL) one by mouth weekly  #5 Each x 3   Entered by:   Willy Eddy, LPN   Authorized by:   Stacie Glaze MD   Signed by:   Willy Eddy, LPN on 70/35/0093   Method used:   Electronically to        [facility]  [phone]* (retail)       [address]       [place], [place]  [address]       Ph: [phone] or [phone]       Fax: [phone]   RxID:   [id number]

## 2010-05-19 NOTE — Progress Notes (Signed)
Summary: laryngitis  Phone Note Call from Patient   Caller: Patient Call For: Dr. Lovell Sheehan Reason for Call: Insurance Question Complaint: Earache/Ear Infection Summary of Call: Pt is c/o sore throat, fever around 100, laryngitis and would like to be seen.   Nicolette Bang ([facility]) Initial call taken by: Lynann Beaver CMA,  Sep 03, 2007 8:59 AM  Follow-up for Phone Call        PT CALLBACK AGAIN/PT IS AWARE [facility] HAS NOT FORGET HER Follow-up by: Heron Sabins,  Sep 03, 2007 10:39 AM  Additional Follow-up for Phone Call Additional follow up Details #1::        gave pt an appointment Additional Follow-up by: Willy Eddy, LPN,  Sep 03, 2007 10:44 AM

## 2010-05-19 NOTE — Letter (Signed)
Summary: [facility]  [facility]   Imported By: Maryln Gottron 01/19/2009 14:02:54  _____________________________________________________________________  External Attachment:    Type:   Image     Comment:   External Document

## 2010-05-19 NOTE — Letter (Signed)
Summary: Patient Notice- Polyp Results  [facility]  [address] [place], [place] [address]   Phone: [phone]  Fax: [phone]        July 03, 2008 MRN: [id number]    Patricia Moore [address] [place], [place]  [address]    Dear Ms. Inzunza,  I am pleased to inform you that the colon polyp(s) removed during your recent colonoscopy was (were) found to be benign (no cancer detected) upon pathologic examination.  I recommend you have a repeat colonoscopy examination in 5 years to look for recurrent polyps, as having colon polyps increases your risk for having recurrent polyps or even colon cancer in the future.  Should you develop new or worsening symptoms of abdominal pain, bowel habit changes or bleeding from the rectum or bowels, please schedule an evaluation with either your primary care physician or with me.  Additional information/recommendations:  _X_ No further action with gastroenterology is needed at this time. Please      follow-up with your primary care physician for your other healthcare      needs.  Please call us if you are having persistent problems or have questions about your condition that have not been fully answered at this time.  Sincerely,  Hilarie Fredrickson MD  This letter has been electronically signed by your physician.

## 2010-05-19 NOTE — Assessment & Plan Note (Signed)
Summary: spider bite/dm   Vital Signs:  Patient profile:   77 year old female Height:      61 inches Weight:      151 pounds BMI:     28.63 Temp:     98.2 degrees F oral Pulse rate:   72 / minute Resp:     14 per minute BP sitting:   130 / 80  (left arm)  Vitals Entered By: Willy Eddy, LPN (December 13, 2009 4:59 PM) CC: c/o rash on neck with itching-thinks spider bite- saw dr Rogelia Rohrer on friday and given keflex and using witchhazel on skink Is Patient Diabetic? Yes Did you bring your meter with you today? No   Primary Care Provider:  Stacie Glaze MD  CC:  c/o rash on neck with itching-thinks spider bite- saw dr Rogelia Rohrer on friday and given keflex and using witchhazel on skink.  History of Present Illness: at the site of a bite on the neck has not angioedema and blotchy rash eyrthematous and red no tenderness was given a course of keflex without relief  Preventive Screening-Counseling & Management  Alcohol-Tobacco     Alcohol drinks/day: 1     Smoking Status: never     Passive Smoke Exposure: no  Current Medications (verified): 1)  Alprazolam 0.25 Mg Tbdp (Alprazolam) .... One By Mouth Three Times A Day  As Needed 2)  Vitamin C 500 Mg  Tabs (Ascorbic Acid) .... As Needed 3)  Chelated Zinc 23 Mg  Lozg (Zinc) .... As Needed 4)  Glucovance 2.5-500 Mg Tabs (Glyburide-Metformin) .... One By Mouth Bid 5)  Vitamin D 16109 Unit  Caps (Ergocalciferol) .... One By Mouth Weekly 6)  Onetouch Ultra Test  Strp (Glucose Blood) .... Test Two Times A Day As Directed 7)  Benicar 40 Mg Tabs (Olmesartan Medoxomil) .... One By Mouth Dailly 8)  Lasix 20 Mg Tabs (Furosemide) .... One By Mouth Daily 9)  Nystatin 100000 Unit/ml Susp (Nystatin) .Marland Kitchen.. 10 Cc Gargle and Swalloww Three Times A Day Prn 10)  Onetouch Ultrasoft Lancets  Misc (Lancets) .... Check Blood Sugars As Directed 11)  Keflex 500 Mg Caps (Cephalexin) .Marland Kitchen.. 1 Cap By Mouth 4 X Daily X 10day  Allergies (verified): No Known Drug  Allergies  Past History:  Family History: Last updated: 05/26/2008 Family History Diabetes 1st degree relative Family History Hypertension Family History of Breast Cancer Maternal Aunts x2 Family History of Colon Cancer:Paternal Uncles x5 Family History of Heart Disease: Mother & Father  Social History: Last updated: 05/26/2008 Retired Married Patient has never smoked.  Alcohol Use - yes- rarely Illicit Drug Use - no  Risk Factors: Alcohol Use: 1 (12/13/2009) Exercise: yes (02/01/2007)  Risk Factors: Smoking Status: never (12/13/2009) Passive Smoke Exposure: no (12/13/2009)  Past medical, surgical, family and social histories (including risk factors) reviewed, and no changes noted (except as noted below).  Past Medical History: Reviewed history from 05/26/2008 and no changes required. Allergic rhinitis Hypertension Osteoporosis Diabetes mellitus, type II back pain Osteoarthritis Adenomatous Colon Polyps Diverticulosis Hyperlipidemia  Past Surgical History: Reviewed history from 03/18/2008 and no changes required.  breast bx Lumpectomy  Family History: Reviewed history from 05/26/2008 and no changes required. Family History Diabetes 1st degree relative Family History Hypertension Family History of Breast Cancer Maternal Aunts x2 Family History of Colon Cancer:Paternal Uncles x5 Family History of Heart Disease: Mother & Father  Social History: Reviewed history from 05/26/2008 and no changes required. Retired Married Patient has never smoked.  Alcohol  Use - yes- rarely Illicit Drug Use - no  Review of Systems  The patient denies anorexia, fever, weight loss, weight gain, vision loss, decreased hearing, hoarseness, chest pain, syncope, dyspnea on exertion, peripheral edema, prolonged cough, headaches, hemoptysis, abdominal pain, melena, hematochezia, severe indigestion/heartburn, hematuria, incontinence, genital sores, muscle weakness, suspicious skin  lesions, transient blindness, difficulty walking, depression, unusual weight change, abnormal bleeding, enlarged lymph nodes, angioedema, and breast masses.    Physical Exam  General:  Well-developed,well-nourished,in no acute distress; alert,appropriate and cooperative throughout examination Eyes:  pupils equal and pupils round.   Ears:  R ear normal and L ear normal.   Nose:  no external deformity and no nasal discharge.   Neck:  cervical lymphadenopathy.   Lungs:  Normal respiratory effort, chest expands symmetrically. Lungs are clear to auscultation, no crackles or wheezes. Heart:  Normal rate and regular rhythm. S1 and S2 normal without gallop, murmur, click, rub or other extra sounds. Abdomen:  Bowel sounds positive,abdomen soft and non-tender without masses, organomegaly or hernias noted. Msk:  No deformity or scoliosis noted of thoracic or lumbar spine.   Extremities:  No clubbing, cyanosis, edema, or deformity noted with normal full range of motion of all joints.   Neurologic:  alert & oriented X3 and finger-to-nose normal.     Impression & Recommendations:  Problem # 1:  CELLULITIS, NECK (ICD-682.1)  The following medications were removed from the medication list:    Keflex 500 Mg Caps (Cephalexin) .Marland Kitchen... 1 cap by mouth 4 x daily x 10day Her updated medication list for this problem includes:    Doxycycline Hyclate 100 Mg Solr (Doxycycline hyclate) ..... One by mouth two times a day    Sulfamethoxazole-tmp Ds 800-160 Mg Tabs (Sulfamethoxazole-trimethoprim) ..... One by mouth two times a day for 14 days  Elevate affected area. Warm moist compresses for 20 minutes every 2 hours while awake. Take antibiotics as directed and take acetaminophen as needed. To be seen in 48-72 hours if no improvement, sooner if worse.  Problem # 2:  BARTHOLIN'S CYST, RECURRENT (ICD-616.2) hx of possible MRSA will treat with both doxy and septra  Complete Medication List: 1)  Alprazolam 0.25 Mg Tbdp  (Alprazolam) .... One by mouth three times a day  as needed 2)  Vitamin C 500 Mg Tabs (Ascorbic acid) .... As needed 3)  Chelated Zinc 23 Mg Lozg (Zinc) .... As needed 4)  Glucovance 2.5-500 Mg Tabs (Glyburide-metformin) .... One by mouth bid 5)  Vitamin D 16109 Unit Caps (Ergocalciferol) .... One by mouth weekly 6)  Onetouch Ultra Test Strp (Glucose blood) .... Test two times a day as directed 7)  Benicar 40 Mg Tabs (Olmesartan medoxomil) .... One by mouth dailly 8)  Lasix 20 Mg Tabs (Furosemide) .... One by mouth daily 9)  Nystatin 100000 Unit/ml Susp (Nystatin) .Marland Kitchen.. 10 cc gargle and swalloww three times a day prn 10)  Onetouch Ultrasoft Lancets Misc (Lancets) .... Check blood sugars as directed 11)  Doxycycline Hyclate 100 Mg Solr (Doxycycline hyclate) .... One by mouth two times a day 12)  Sulfamethoxazole-tmp Ds 800-160 Mg Tabs (Sulfamethoxazole-trimethoprim) .... One by mouth two times a day for 14 days  Patient Instructions: 1)  Take your antibiotic as prescribed until ALL of it is gone, but stop if you develop a rash or swelling and contact our office as soon as possible. 2)  "sitz" baths in warm sudzy water Prescriptions: SULFAMETHOXAZOLE-TMP DS 800-160 MG TABS (SULFAMETHOXAZOLE-TRIMETHOPRIM) one by mouth two times a day  for 14 days  #28 x 0   Entered and Authorized by:   Stacie Glaze MD   Signed by:   Stacie Glaze MD on 12/13/2009   Method used:   Electronically to        [facility]  [phone]* (retail)       [address]       [place], [place]  [address]       Ph: [phone] or [phone]       Fax: [phone]   RxID:   [id number] DOXYCYCLINE HYCLATE 100 MG SOLR (DOXYCYCLINE HYCLATE) one by mouth two times a day  #28 x 0   Entered and Authorized by:   Stacie Glaze MD   Signed by:   Stacie Glaze MD on 12/13/2009   Method used:   Electronically to        [facility]  [phone]* (retail)       [address]       [place], [place]  [address]       Ph: [phone] or [phone]       Fax: [phone]   RxID:   [id number]

## 2010-05-19 NOTE — Assessment & Plan Note (Signed)
Summary: 2 month rov/njr   Vital Signs:  Patient profile:   77 year old female Height:      61 inches Weight:      154 pounds BMI:     29.20 Temp:     98.5 degrees F oral Pulse rate:   76 / minute Resp:     14 per minute BP sitting:   130 / 80  (left arm)  Vitals Entered By: Willy Eddy, LPN (December 02, 2008 10:49 AM)  Primary Care Provider:  Stacie Glaze MD  CC:  roa- continues to c/o feet swelling.  History of Present Illness:  Follow-Up Visit      This is a 77 year old woman who presents for Follow-up visit.  The patient denies chest pain, palpitations, dizziness, syncope, low blood sugar symptoms, high blood sugar symptoms, edema, SOB, DOE, PND, and orthopnea.  Since the last visit the patient notes no new problems or concerns.  The patient reports monitoring BP.  When questioned about possible medication side effects, the patient notes fatigue and muscle aches.    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 One By Mouth Monthly As  Directed 2)  Alprazolam 0.25 Mg Tbdp (Alprazolam) .... One By Mouth Three Times A Day  As Needed 3)  Vitamin C 500 Mg  Tabs (Ascorbic Acid) .... As Needed 4)  Chelated Zinc 23 Mg  Lozg (Zinc) .... As Needed 5)  Glucosamine 1500 Complex   Caps (Glucosamine-Chondroit-Vit C-Mn) .Marland Kitchen.. 1-3 Once Daily 6)  Chondroitin 1500 .... Once Daily 7)  Amaryl 2 Mg Tabs (Glimepiride) .... Take One (1) By Mouth Once A Day 8)  Vitamin D 16109 Unit  Caps (Ergocalciferol) .... One By Mouth Weekly 9)  Onetouch Ultra Test  Strp (Glucose Blood) .... Test Two Times A Day As Directed 10)  Benicar Hct 40-12.5 Mg Tabs (Olmesartan Medoxomil-Hctz) .... One By Mouth Daily  Current Medications (verified): 1)  Actonel 150 Mg  Tabs (Risedronate Sodium) .... One By Mouth Monthly As Directed 2)  Alprazolam 0.25 Mg Tbdp (Alprazolam) .... One By Mouth Three Times A Day  As Needed 3)  Vitamin C 500 Mg  Tabs (Ascorbic Acid) .... As Needed 4)  Chelated Zinc 23 Mg  Lozg (Zinc) .... As Needed 5)  Glucosamine 1500 Complex   Caps (Glucosamine-Chondroit-Vit C-Mn) .Marland Kitchen.. 1-3 Once Daily 6)  Chondroitin 1500 .... Once Daily 7)  Amaryl 2 Mg Tabs (Glimepiride) .... Take One (1) By Mouth Once A Day 8)  Vitamin D 60454 Unit  Caps (Ergocalciferol) .... One By Mouth Weekly 9)  Onetouch Ultra  Test  Strp (Glucose Blood) .... Test Two Times A Day As Directed 10)  Benicar Hct 40-12.5 Mg Tabs (Olmesartan Medoxomil-Hctz) .... One By Mouth Daily  Allergies (verified): No Known Drug Allergies  Past History:  Family History: Last updated: 05/26/2008 Family History Diabetes 1st degree relative Family History Hypertension Family History of Breast Cancer Maternal Aunts x2 Family History of Colon Cancer:Paternal Uncles x5 Family History of Heart Disease: Mother & Father  Social History: Last updated: 05/26/2008 Retired Married Patient has never smoked.  Alcohol Use - yes- rarely Illicit Drug Use - no  Risk Factors: Alcohol Use: 1  (03/19/2007) Exercise: yes (02/01/2007)  Risk Factors: Smoking Status: never (05/26/2008) Passive Smoke Exposure: no (03/19/2007)  Past medical, surgical, family and social histories (including risk factors) reviewed, and no changes noted (except as noted below).  Past Medical History: Reviewed history from 05/26/2008 and no changes required. Allergic rhinitis Hypertension Osteoporosis Diabetes mellitus, type II back pain Osteoarthritis Adenomatous Colon Polyps Diverticulosis Hyperlipidemia  Past Surgical History: Reviewed history from 03/18/2008 and no changes required.  breast bx Lumpectomy  Family History: Reviewed history from 05/26/2008 and no changes required. Family History Diabetes 1st degree relative Family History Hypertension Family History of Breast Cancer Maternal Aunts x2 Family History of Colon Cancer:Paternal Uncles x5 Family History of Heart Disease: Mother & Father  Social History: Reviewed history from 05/26/2008 and no changes required. Retired Married Patient has never smoked.  Alcohol Use - yes- rarely Illicit Drug Use - no  Review of Systems  The patient denies anorexia, fever, weight loss, weight gain, vision loss, decreased hearing, hoarseness, chest pain, syncope, dyspnea on exertion, peripheral edema, prolonged cough, headaches, hemoptysis, abdominal pain, melena, severe indigestion/heartburn, hematuria, incontinence, genital sores, muscle weakness, suspicious skin lesions, transient blindness, difficulty walking, depression, unusual weight change, abnormal bleeding, enlarged lymph nodes, angioedema, breast masses, and testicular masses.    Physical Exam  General:  Well developed, well nourished, no acute distress. Head:  Normocephalic and atraumatic. Eyes:  pupils equal and pupils round.   Ears:  R ear normal and L ear normal.   Mouth:  No deformity or lesions,   Neck:  supple and full ROM.   Lungs:  Clear throughout to  auscultation. Heart:  Regular rate and rhythm; no murmurs, rubs,  or bruits. Abdomen:  Soft, nontender and nondistended. No masses, hepatosplenomegaly or hernias noted. Normal bowel sounds. Extremities:  trace left pedal edema and trace right pedal edema.   Neurologic:  alert & oriented X3 and finger-to-nose normal.     Impression & Recommendations:  Problem # 1:  UNSPECIFIED HYPERSENSITIVITY ANGIITIS (ICD-446.20) the pt has shell fish and aplle allergies and has persistant episodes of swelling  Problem # 2:  OSTEOARTHRITIS (ICD-715.90)  Discussed use of medications, application of heat or cold, and exercises.   Orders: Venipuncture (83151) TLB-Uric Acid, Blood (84550-URIC)  Complete Medication List: 1)  Actonel 150 Mg Tabs (Risedronate sodium) .... One by mouth monthly as directed 2)  Alprazolam 0.25 Mg Tbdp (Alprazolam) .... One by mouth three times a day  as needed 3)  Vitamin C 500 Mg Tabs (Ascorbic acid) .... As needed 4)  Chelated Zinc 23 Mg Lozg (Zinc) .... As needed 5)  Glucosamine 1500 Complex Caps (Glucosamine-chondroit-vit c-mn) .Marland Kitchen.. 1-3 once daily 6)  Chondroitin 1500  .... Once daily 7)  Amaryl 2 Mg Tabs (Glimepiride) .... Take one (1) by mouth once a day 8)  Vitamin D 76160 Unit Caps (Ergocalciferol) .... One by mouth weekly 9)  Onetouch Ultra Test Strp (Glucose blood) .... Test two times a day as directed 10)  Benicar Hct 40-12.5 Mg Tabs (Olmesartan medoxomil-hctz) .... One by mouth daily  Patient Instructions: 1)  Dr Seymour Callas or Dr Maple Hudson  for allergies look for referral and call back and we will refer you ( leave a message fro [facility]) 2)  Please schedule a follow-up appointment in 3 months.

## 2010-05-19 NOTE — Progress Notes (Signed)
Summary: REFILL  Phone Note Call from Patient Call back at Home Phone [phone]   Caller: PATIENT WALKED IN TO OFFICE Summary of Call: NEEDS REFILL OF FUROSEMIODE 20MG  TAB SENT TO [facility] Initial call taken by: Trixie Dredge,  January 05, 2009 12:06 PM    Prescriptions: LASIX 20 MG TABS (FUROSEMIDE) one by mouth daily  #30 x 6   Entered by:   Willy Eddy, LPN   Authorized by:   Stacie Glaze MD   Signed by:   Willy Eddy, LPN on 95/62/1308   Method used:   Electronically to        [facility]  [phone]* (retail)       [address]       [place], [place]  [address]       Ph: [phone] or [phone]       Fax: [phone]   RxID:   [id number]

## 2010-05-19 NOTE — Assessment & Plan Note (Signed)
Summary: 3 mo rov/mm   Vital Signs:  Patient profile:   77 year old female Height:      61 inches Weight:      154 pounds BMI:     29.20 Temp:     98.2 degrees F oral Pulse rate:   76 / minute Resp:     14 per minute BP sitting:   122 / 80  (left arm)  Vitals Entered By: Willy Eddy, LPN (Aug 20, 1608 9:40 AM)  Primary Care Provider:  Reynaldo Minium, MD  CC:  roa- c/o rt leg swelling while in [place].  History of Present Illness: pt presents for follow up  Follow-Up Visit      This is a 77 year old woman who presents for Follow-up visit.  pt went to [facility]  on plane and three days after that her ankle was swollen, non tender, cannot relate with salt was not able to chesck blood pressure but felt head aches and some head pressure like her blood pressure was increased.  The patient denies chest pain, palpitations, dizziness, syncope, low blood sugar symptoms, high blood sugar symptoms, edema, SOB, DOE, PND, and orthopnea.  Since the last visit the patient notes problems with medications.  The patient reports taking meds as prescribed, not monitoring BP, and dietary compliance.  When questioned about possible medication side effects, the patient notes none.    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 One By Mouth Monthly As Directed 2)  Xanax 0.25 Mg  Tabs (Alprazolam) .... As Needed 3)  Vitamin C 500 Mg  Tabs (Ascorbic Acid) .... As Needed 4)  Chelated Zinc 23 Mg  Lozg (Zinc) .... As Needed 5)  Glucosamine 1500 Complex   Caps (Glucosamine-Chondroit-Vit C-Mn) .Marland Kitchen.. 1-3 Once Daily 6)  Chondroitin 1500 .... Once Daily 7)  Amaryl 2 Mg Tabs (Glimepiride) .... Take One (1) By Mouth Once A Day 8)  Vitamin D 96045 Unit  Caps (Ergocalciferol) .... One By Mouth Weekly 9)  Onetouch Ultra Test  Strp (Glucose Blood) .... One Qd, Use One Every Day 10)  Benicar Hct 20-12.5 Mg Tabs (Olmesartan Medoxomil-Hctz) .... One By Mouth Daily  Current Medications (verified): 1)  Actonel 150 Mg  Tabs (Risedronate Sodium) .... One By Mouth Monthly As Directed 2)  Alprazolam 0.25 Mg Tbdp (Alprazolam) .... One By Mouth Three Times A Day  As Needed 3)  Vitamin C 500 Mg  Tabs (Ascorbic Acid) .... As Needed 4)  Chelated Zinc 23 Mg  Lozg (Zinc) .... As Needed 5)  Glucosamine 1500 Complex   Caps (Glucosamine-Chondroit-Vit C-Mn) .Marland Kitchen.. 1-3 Once Daily 6)  Chondroitin 1500 .... Once Daily 7)  Amaryl 2 Mg Tabs (Glimepiride) .Marland KitchenMarland KitchenMarland Kitchen  Take One (1) By Mouth Once A Day 8)  Vitamin D 56213 Unit  Caps (Ergocalciferol) .... One By Mouth Weekly 9)  Onetouch Ultra Test  Strp (Glucose Blood) .... Test Two Times A Day As Directed 10)  Benicar Hct 40-12.5 Mg Tabs (Olmesartan Medoxomil-Hctz) .... One By Mouth Daily  Allergies (verified): No Known Drug Allergies  Past History:  Family History:    Family History Diabetes 1st degree relative    Family History Hypertension    Family History of Breast Cancer Maternal Aunts x2    Family History of Colon Cancer:Paternal Uncles x5    Family History of Heart Disease: Mother & Father     (05/26/2008)  Social History:    Retired    Married     Patient has never smoked.     Alcohol Use - yes- rarely    Illicit Drug Use - no     (05/26/2008)  Risk Factors:    Alcohol Use: 1 (03/19/2007)    >5 drinks/d w/in last 3 months: N/A    Caffeine Use: N/A    Diet: N/A    Exercise: yes (02/01/2007)  Risk Factors:    Smoking Status: never (05/26/2008)    Packs/Day: N/A    Cigars/wk: N/A    Pipe Use/wk: N/A    Cans of tobacco/wk: N/A    Passive Smoke Exposure: no (03/19/2007)  Past medical, surgical, family and social histories (including risk factors) reviewed, and no changes noted (except as noted below).  Past Medical History:    Reviewed history from 05/26/2008 and no changes required:    Allergic rhinitis    Hypertension    Osteoporosis    Diabetes mellitus, type II    back pain    Osteoarthritis    Adenomatous Colon Polyps    Diverticulosis    Hyperlipidemia  Past Surgical History:    Reviewed history from 03/18/2008 and no changes required:     breast bx    Lumpectomy  Family History:    Reviewed history from 05/26/2008 and no changes required:       Family History Diabetes 1st degree relative       Family History Hypertension       Family History of Breast Cancer Maternal Aunts x2       Family History of Colon Cancer:Paternal Uncles x5       Family History of Heart Disease: Mother & Father  Social History:    Reviewed history from 05/26/2008 and no changes required:       Retired       Married       Patient has never smoked.        Alcohol Use - yes- rarely       Illicit Drug Use - no  Review of Systems  The patient denies anorexia, fever, weight loss, weight gain, vision loss, decreased hearing, hoarseness, chest pain, syncope, dyspnea on exertion, peripheral edema, prolonged cough, headaches, hemoptysis, abdominal pain, melena, hematochezia, severe indigestion/heartburn, hematuria, incontinence, genital sores, muscle weakness, suspicious skin lesions, transient blindness, difficulty walking, depression,  unusual weight change, abnormal bleeding, enlarged lymph nodes, angioedema, and breast masses.    Physical Exam  General:  Well developed, well nourished, no acute distress. Head:  Normocephalic and atraumatic. Eyes:  pupils equal and pupils round.   Ears:  R ear normal and L ear normal.   Mouth:  No deformity or lesions,   Neck:  supple and full ROM.   Lungs:  Clear  throughout to auscultation. Heart:  Regular rate and rhythm; no murmurs, rubs,  or bruits. Abdomen:  Soft, nontender and nondistended. No masses, hepatosplenomegaly or hernias noted. Normal bowel sounds. Pulses:  R and L carotid,radial,femoral,dorsalis pedis and posterior tibial pulses are full and equal bilaterally Extremities:  trace left pedal edema and trace right pedal edema.   Neurologic:  alert & oriented X3 and finger-to-nose normal.     Impression & Recommendations:  Problem # 1:  HYPERTENSION (ICD-401.9) periodic edema, partly due to diet changes and I suspect poor general blood pressure controll will increase the benicar and moniter rtesults The following medications were removed from the medication list:    Benicar Hct 20-12.5 Mg Tabs (Olmesartan medoxomil-hctz) ..... One by mouth daily Her updated medication list for this problem includes:    Benicar Hct 40-12.5 Mg Tabs (Olmesartan medoxomil-hctz) ..... One by mouth daily  BP today: 122/80 Prior BP: 120/58 (05/26/2008)  Prior 10 Yr Risk Heart Disease: 13 % (05/20/2008)  Labs Reviewed: K+: 4.8 (05/20/2008) Creat: : 0.8 (05/20/2008)   Chol: 222 (03/11/2008)   HDL: 62.3 (03/11/2008)   LDL: DEL (03/11/2008)   TG: 87 (03/11/2008)  Problem # 2:  DIABETES MELLITUS, TYPE II (ICD-250.00)  The following medications were removed from the medication list:    Benicar Hct 20-12.5 Mg Tabs (Olmesartan medoxomil-hctz) ..... One by mouth daily Her updated medication list for this problem includes:    Amaryl 2 Mg Tabs (Glimepiride) .Marland Kitchen... Take one (1) by mouth once a  day    Benicar Hct 40-12.5 Mg Tabs (Olmesartan medoxomil-hctz) ..... One by mouth daily she also noted that her Labs Reviewed: Creat: 0.8 (05/20/2008)     Last Eye Exam: Results: Normal.  (03/20/2008) Reviewed HgBA1c results: 6.6 (05/20/2008)  6.7 (03/11/2008)  Problem # 3:  ALLERGIC RHINITIS (ICD-477.9)  Discussed use of allergy medications and environmental measures.   Complete Medication List: 1)  Actonel 150 Mg Tabs (Risedronate sodium) .... One by mouth monthly as directed 2)  Alprazolam 0.25 Mg Tbdp (Alprazolam) .... One by mouth three times a day  as needed 3)  Vitamin C 500 Mg Tabs (Ascorbic acid) .... As needed 4)  Chelated Zinc 23 Mg Lozg (Zinc) .... As needed 5)  Glucosamine 1500 Complex Caps (Glucosamine-chondroit-vit c-mn) .Marland Kitchen.. 1-3 once daily 6)  Chondroitin 1500  .... Once daily 7)  Amaryl 2 Mg Tabs (Glimepiride) .... Take one (1) by mouth once a day 8)  Vitamin D 32202 Unit Caps (Ergocalciferol) .... One by mouth weekly 9)  Onetouch Ultra Test Strp (Glucose blood) .... Test two times a day as directed 10)  Benicar Hct 40-12.5 Mg Tabs (Olmesartan medoxomil-hctz) .... One by mouth daily  Patient Instructions: 1)  Please schedule a follow-up appointment in  6-7  weeks. 2)  please try for a  between 1-130 Prescriptions: ALPRAZOLAM 0.25 MG TBDP (ALPRAZOLAM) one by mouth three times a day  as needed  #90 x 3   Entered and Authorized by:   Stacie Glaze MD   Signed by:   Stacie Glaze MD on 08/20/2008   Method used:   Print then Give to Patient   RxID:   [id number] ONETOUCH ULTRA TEST  STRP (GLUCOSE BLOOD) test two times a day as directed  #100 x 11   Entered and Authorized by:   Stacie Glaze MD   Signed by:   Stacie Glaze MD on 08/20/2008   Method used:   Electronically to  [facility]  [address]  [phone]* (retail)       [address]       [place], [place]  [address]       Ph: [phone] or [phone]       Fax:  [phone]   RxID:   [id number]

## 2010-05-25 NOTE — Assessment & Plan Note (Signed)
Summary: ROA/FUP/RCD   Vital Signs:  Patient profile:   77 year old female Height:      61 inches Weight:      154 pounds BMI:     29.20 Temp:     98.2 degrees F oral Pulse rate:   80 / minute Resp:     14 per minute BP sitting:   136 / 76  (left arm)  Vitals Entered By: Willy Eddy, LPN (May 16, 2010 1:52 PM) CC: ROA, Hypertension Management Is Patient Diabetic? Yes Did you bring your meter with you today? No   Primary Care Provider:  Stacie Glaze MD  CC:  ROA and Hypertension Management.  History of Present Illness:  this is a 77 year old Hispanic female who presents for routine followup visit for her multiple medical problems including hypertension diabetes and hyperlipidemia. She has been feeling well.  recent CBGs were within normal levels... but  but recently she has had some blood sugars that are out of normal range.   Hypertension History:      She denies headache, chest pain, palpitations, dyspnea with exertion, orthopnea, PND, peripheral edema, visual symptoms, neurologic problems, syncope, and side effects from treatment.        Positive major cardiovascular risk factors include female age 75 years old or older, diabetes, hyperlipidemia, and hypertension.  Negative major cardiovascular risk factors include negative family history for ischemic heart disease and non-tobacco-user status.     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) 9)   Edema  (ICD-782.3) 10)  Chest Pain, Atypical  (ICD-786.59) 11)  Rectal Bleeding  (ICD-569.3) 12)  Angiodysplasia-intestine  (ICD-569.84) 13)  Family Hx Colon Cancer  (ICD-V16.0) 14)  Flatulence-gas-bloating  (ICD-787.3) 15)  Diverticulosis, Colon  (ICD-562.10) 16)  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) 17)  Cough Variant Asthma  (ICD-493.82) 18)  Dermatophytosis of Nail  (ICD-110.1) 19)  Bronchitis, Acute  (ICD-466.0) 20)  Influenza  (ICD-487.8) 21)  Family History Diabetes 1st Degree Relative  (ICD-V18.0) 22)  Preventive Health Care  (ICD-V70.0) 23)  Osteoarthrosis, Local Nos, Lower Leg  (ICD-715.36) 24)  Osteoarthritis  (ICD-715.90) 25)  Back Pain  (ICD-724.5) 26)  Diabetes Mellitus, Type II  (ICD-250.00) 27)  Osteoporosis  (ICD-733.00) 28)  Hypertension  (ICD-401.9) 29)  Allergic Rhinitis  (ICD-477.9)  Current Problems (verified): 1)  Bartholin's Cyst, Recurrent  (ICD-616.2) 2)  Cellulitis, Neck  (ICD-682.1) 3)  Hyperlipidemia, Mild  (ICD-272.4) 4)  Cellulitis and Abscess of Unspecified Site  (ICD-682.9) 5)  Carcinoma, Skin, Squamous Cell, Face  (ICD-173.3) 6)  Diverticulitis of Colon  (ICD-562.11) 7)  Varicose Veins Lower Extremities W/oth Comps  (ICD-454.8) 8)  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) 9)  Edema  (ICD-782.3) 10)  Chest Pain, Atypical  (ICD-786.59) 11)  Rectal Bleeding  (ICD-569.3) 12)  Angiodysplasia-intestine  (ICD-569.84) 13)  Family Hx Colon Cancer  (ICD-V16.0) 14)  Flatulence-gas-bloating  (ICD-787.3) 15)  Diverticulosis, Colon  (ICD-562.10) 16)  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 One By Mouth Three Times A Day  As Needed 2)  Vitamin C 500 Mg  Tabs (Ascorbic Acid) .... As Needed 3)  Chelated Zinc 23 Mg  Lozg (Zinc) .... As Needed 4)  Glucovance 2.5-500 Mg Tabs (Glyburide-Metformin) .... One By Mouth Bid 5)  Ergocalciferol 50000 Unit Caps (Ergocalciferol) .Marland Kitchen.. 1 Every Week 6)  Onetouch Ultra Test  Strp (Glucose Blood) .... Test Two Times A Day As Directed 7)  Benicar 40 Mg Tabs (Olmesartan Medoxomil) .... One By Mouth Dailly 8)  Lasix 20 Mg Tabs (Furosemide) .... One By Mouth Daily 9)  Onetouch Ultrasoft Lancets  Misc (Lancets) .... Check Blood Sugars As Directed  Current Medications (verified): 1)  Alprazolam 0.25 Mg Tbdp (Alprazolam) .... One By Mouth Three Times A Day  As Needed 2)  Vitamin C 500 Mg  Tabs (Ascorbic Acid) .... As Needed 3)  Chelated Zinc 23 Mg  Lozg (Zinc) .... As Needed 4)  Glucovance 2.5-500 Mg Tabs (Glyburide-Metformin) .... One By Mouth Bid 5)  Ergocalciferol 50000 Unit Caps (Ergocalciferol) .Marland Kitchen.. 1 Every Week 6)  Onetouch Ultra Test  Strp (Glucose Blood) .... Test Two Times A Day As Directed 7)  Benicar 40 Mg Tabs (Olmesartan Medoxomil) .... One By Mouth Dailly 8)  Lasix 20 Mg Tabs (Furosemide) .... One By Mouth Daily 9)  Onetouch Ultrasoft Lancets  Misc (Lancets) .... Check Blood Sugars As Directed  Allergies (verified): No Known Drug Allergies  Past History:  Family History: Last updated: 05/26/2008 Family History Diabetes 1st degree relative Family History Hypertension Family History of Breast Cancer Maternal Aunts x2 Family History of Colon Cancer:Paternal Uncles x5 Family History of Heart Disease: Mother & Father  Social History: Last updated: 05/26/2008 Retired Married Patient has never  smoked.  Alcohol Use - yes- rarely Illicit Drug Use - no  Risk Factors: Alcohol Use: 1 (05/16/2010) Exercise: yes (02/01/2007)  Risk Factors: Smoking Status: never (05/16/2010) Passive Smoke Exposure: no (05/16/2010)  Past medical, surgical, family and social histories (including risk factors) reviewed, and no changes noted (except as noted below).  Past Medical History: Reviewed history from 05/26/2008 and no changes required. Allergic rhinitis Hypertension Osteoporosis Diabetes mellitus, type II back pain Osteoarthritis Adenomatous Colon Polyps Diverticulosis Hyperlipidemia  Past Surgical History: Reviewed history from 03/18/2008 and no changes required.  breast bx Lumpectomy  Family History: Reviewed history from 05/26/2008 and no changes required. Family History Diabetes 1st degree relative Family History Hypertension Family History of Breast Cancer Maternal Aunts x2 Family History of Colon Cancer:Paternal Uncles x5 Family History of Heart Disease: Mother & Father  Social History: Reviewed history from 05/26/2008 and no changes required. Retired Married Patient has never smoked.  Alcohol Use - yes- rarely Illicit Drug Use - no  Review of Systems  The patient denies anorexia, fever, weight loss, weight gain, vision loss, decreased hearing, hoarseness, chest pain, syncope, dyspnea on exertion, peripheral edema, prolonged cough, headaches, hemoptysis, abdominal pain, melena,  hematochezia, severe indigestion/heartburn, hematuria, incontinence, genital sores, muscle weakness, suspicious skin lesions, transient blindness, difficulty walking, depression, unusual weight change, abnormal bleeding, enlarged lymph nodes, angioedema, and breast masses.    Physical Exam  General:  Well-developed,well-nourished,in no acute distress; alert,appropriate and cooperative throughout examination Head:  Normocephalic and atraumatic without obvious abnormalities. No apparent  alopecia or balding. Eyes:  pupils equal and pupils round.   Ears:  R ear normal and L ear normal.   Nose:  no external deformity and no nasal discharge.   Mouth:  Oral mucosa and oropharynx without lesions or exudates.  Teeth in good repair. Neck:  cervical lymphadenopathy.   Lungs:  Normal respiratory effort, chest expands symmetrically. Lungs are clear to auscultation, no crackles or wheezes. Heart:  Normal rate and regular rhythm. S1 and S2 normal without gallop, murmur, click, rub or other extra sounds. Abdomen:  Bowel sounds positive,abdomen soft and non-tender without masses, organomegaly or hernias noted. Msk:  No deformity or scoliosis noted of thoracic or lumbar spine.    Diabetes Management Exam:    Foot Exam (with socks and/or shoes not present):       Sensory-Pinprick/Light touch:          Left medial foot (L-4): normal          Left dorsal foot (L-5): normal          Left lateral foot (S-1): normal          Right medial foot (L-4): normal          Right dorsal foot (L-5): normal          Right lateral foot (S-1): normal       Sensory-Monofilament:          Left foot: normal          Right foot: normal       Inspection:          Left foot: normal          Right foot: normal       Nails:          Left foot: normal          Right foot: normal   Impression & Recommendations:  Problem # 1:  DIABETES MELLITUS, TYPE II (ICD-250.00)  is no need to monitor  a microalbumin since this patient is on an ARB  We will monitor an A1c today and she is stable on a single diabetic medication twice a day Her updated medication list for this problem includes:    Glucovance 2.5-500 Mg Tabs (Glyburide-metformin) ..... One by mouth bid    Benicar 40 Mg Tabs (Olmesartan medoxomil) ..... One by mouth dailly  Labs Reviewed: Creat: 0.8 (02/01/2010)     Last Eye Exam: normal (03/17/2010) Reviewed HgBA1c results: 6.8 (02/01/2010)  6.4 (11/02/2009)  Orders: Venipuncture  (16109) TLB-A1C / Hgb A1C (Glycohemoglobin) (83036-A1C) TLB-BMP (Basic Metabolic Panel-BMET) (80048-METABOL) TLB-Cholesterol, Direct LDL (83721-DIRLDL) TLB-Cholesterol, HDL (83718-HDL)  Problem # 2:  HYPERTENSION (ICD-401.9)  due to the patient having some and degeneration changes with increased facial hair of aging has been recommended by her gynecologist that we consider changing the Lasix to  spironolactone.... to have an antiandrogen effect Her updated medication list for this problem includes:    Benicar 40 Mg Tabs (Olmesartan medoxomil) ..... One by mouth dailly    Spironolactone-hctz 25-25 Mg Tabs (Spironolactone-hctz) .Marland Kitchen... 1/2 by mouth daily  BP today: 136/76 Prior BP: 130/80 (02/01/2010)  Prior 10 Yr Risk Heart  Disease: 13 % (11/02/2009)  Labs Reviewed: K+: 4.8 (02/01/2010) Creat: : 0.8 (02/01/2010)   Chol: 204 (07/28/2009)   HDL: 62.40 (07/28/2009)   LDL: 126 (03/29/2009)   TG: 116.0 (07/28/2009)  Problem # 3:  OSTEOARTHRITIS (ICD-715.90)  Discussed use of medications, application of heat or cold, and exercises.   Complete Medication List: 1)  Alprazolam 0.25 Mg Tbdp (Alprazolam) .... One by mouth three times a day  as needed 2)  Vitamin C 500 Mg Tabs (Ascorbic acid) .... As needed 3)  Chelated Zinc 23 Mg Lozg (Zinc) .... As needed 4)  Glucovance 2.5-500 Mg Tabs (Glyburide-metformin) .... One by mouth bid 5)  Ergocalciferol 50000 Unit Caps (Ergocalciferol) .Marland Kitchen.. 1 every week 6)  Onetouch Ultra Test Strp (Glucose blood) .... Test two times a day as directed 7)  Benicar 40 Mg Tabs (Olmesartan medoxomil) .... One by mouth dailly 8)  Spironolactone-hctz 25-25 Mg Tabs (Spironolactone-hctz) .... 1/2 by mouth daily 9)  Onetouch Ultrasoft Lancets Misc (Lancets) .... Check blood sugars as directed  Hypertension Assessment/Plan:      The patient's hypertensive risk group is category C: Target organ damage and/or diabetes.  Her calculated 10 year risk of coronary heart disease is  13 %.  Today's blood pressure is 136/76.  Her blood pressure goal is < 130/80.  Patient Instructions: 1)  CPX in june... Prescriptions: SPIRONOLACTONE-HCTZ 25-25 MG TABS (SPIRONOLACTONE-HCTZ) 1/2 by mouth daily  #30 x 11   Entered and Authorized by:   Stacie Glaze MD   Signed by:   Stacie Glaze MD on 05/16/2010   Method used:   Electronically to        [facility]  [phone]* (retail)       [address]       [place], [place]  [address]       Ph: [phone] or [phone]       Fax: [phone]   RxID:   [id number] GLUCOVANCE 2.5-500 MG TABS (GLYBURIDE-METFORMIN) one by mouth BID  #60 Each x 10   Entered by:   Willy Eddy, LPN   Authorized by:   Stacie Glaze MD   Signed by:   Willy Eddy, LPN on 43/32/9518   Method used:   Electronically to        [facility]  [phone]* (retail)       [address]       [place], [place]  [address]       Ph: [phone] or [phone]       Fax: [phone]   RxID:   [id number]    Orders Added: 1)  Venipuncture [94854] 2)  TLB-A1C / Hgb A1C (Glycohemoglobin) [83036-A1C] 3)  TLB-BMP (Basic Metabolic Panel-BMET) [80048-METABOL] 4)  TLB-Cholesterol, Direct LDL [83721-DIRLDL] 5)  TLB-Cholesterol, HDL [83718-HDL] 6)  Est. Patient Level IV [62703]  Appended Document: Orders Update    Clinical Lists Changes  Orders: Added new Service order of Specimen Handling (50093) - Signed

## 2010-07-28 LAB — GLUCOSE, CAPILLARY
Glucose-Capillary: 100 mg/dL — ABNORMAL HIGH (ref 70–99)
Glucose-Capillary: 101 mg/dL — ABNORMAL HIGH (ref 70–99)

## 2010-09-03 ENCOUNTER — Other Ambulatory Visit: Payer: Self-pay | Admitting: Internal Medicine

## 2010-09-03 ENCOUNTER — Other Ambulatory Visit: Payer: Self-pay | Admitting: Family Medicine

## 2010-09-21 ENCOUNTER — Other Ambulatory Visit: Payer: Self-pay | Admitting: *Deleted

## 2010-10-05 ENCOUNTER — Encounter: Payer: Self-pay | Admitting: Internal Medicine

## 2010-10-14 ENCOUNTER — Encounter: Payer: Self-pay | Admitting: Internal Medicine

## 2010-10-28 ENCOUNTER — Ambulatory Visit (INDEPENDENT_AMBULATORY_CARE_PROVIDER_SITE_OTHER): Payer: Medicare Other | Admitting: Internal Medicine

## 2010-10-28 ENCOUNTER — Encounter: Payer: Self-pay | Admitting: Internal Medicine

## 2010-10-28 VITALS — BP 140/80 | HR 72 | Temp 98.2°F | Resp 16 | Ht 61.0 in | Wt 152.0 lb

## 2010-10-28 DIAGNOSIS — Z79899 Other long term (current) drug therapy: Secondary | ICD-10-CM

## 2010-10-28 DIAGNOSIS — Z0000 Encounter for general adult medical examination without abnormal findings: Secondary | ICD-10-CM

## 2010-10-28 DIAGNOSIS — E119 Type 2 diabetes mellitus without complications: Secondary | ICD-10-CM

## 2010-10-28 DIAGNOSIS — E785 Hyperlipidemia, unspecified: Secondary | ICD-10-CM

## 2010-10-28 DIAGNOSIS — I10 Essential (primary) hypertension: Secondary | ICD-10-CM

## 2010-10-28 DIAGNOSIS — M1990 Unspecified osteoarthritis, unspecified site: Secondary | ICD-10-CM

## 2010-10-28 DIAGNOSIS — M1710 Unilateral primary osteoarthritis, unspecified knee: Secondary | ICD-10-CM

## 2010-10-28 LAB — CBC WITH DIFFERENTIAL/PLATELET
Basophils Absolute: 0 10*3/uL (ref 0.0–0.1)
Basophils Relative: 0.6 % (ref 0.0–3.0)
Eosinophils Absolute: 0.8 10*3/uL — ABNORMAL HIGH (ref 0.0–0.7)
Eosinophils Relative: 9.5 % — ABNORMAL HIGH (ref 0.0–5.0)
HCT: 41.2 % (ref 36.0–46.0)
Hemoglobin: 14.1 g/dL (ref 12.0–15.0)
Lymphocytes Relative: 24.8 % (ref 12.0–46.0)
Lymphs Abs: 2 10*3/uL (ref 0.7–4.0)
MCHC: 34.1 g/dL (ref 30.0–36.0)
MCV: 92.5 fl (ref 78.0–100.0)
Monocytes Absolute: 0.5 10*3/uL (ref 0.1–1.0)
Monocytes Relative: 6.3 % (ref 3.0–12.0)
Neutro Abs: 4.8 10*3/uL (ref 1.4–7.7)
Neutrophils Relative %: 58.8 % (ref 43.0–77.0)
Platelets: 327 10*3/uL (ref 150.0–400.0)
RBC: 4.45 Mil/uL (ref 3.87–5.11)
RDW: 13 % (ref 11.5–14.6)
WBC: 8.1 10*3/uL (ref 4.5–10.5)

## 2010-10-28 LAB — BASIC METABOLIC PANEL
BUN: 21 mg/dL (ref 6–23)
CO2: 30 mEq/L (ref 19–32)
Calcium: 9.6 mg/dL (ref 8.4–10.5)
Chloride: 104 mEq/L (ref 96–112)
Creatinine, Ser: 1 mg/dL (ref 0.4–1.2)
GFR: 55.55 mL/min — ABNORMAL LOW (ref >60.00)
Glucose, Bld: 147 mg/dL — ABNORMAL HIGH (ref 70–99)
Potassium: 4.8 mEq/L (ref 3.5–5.1)
Sodium: 139 mEq/L (ref 135–145)

## 2010-10-28 LAB — LIPID PANEL
Cholesterol: 202 mg/dL — ABNORMAL HIGH (ref 0–200)
HDL: 65.9 mg/dL (ref >39.00)
Total CHOL/HDL Ratio: 3
Triglycerides: 96 mg/dL (ref 0.0–149.0)
VLDL: 19.2 mg/dL (ref 0.0–40.0)

## 2010-10-28 LAB — POCT URINALYSIS DIPSTICK
Bilirubin, UA: NEGATIVE
Blood, UA: NEGATIVE
Glucose, UA: NEGATIVE
Ketones, UA: NEGATIVE
Nitrite, UA: NEGATIVE
Protein, UA: NEGATIVE
Spec Grav, UA: 1.015
Urobilinogen, UA: 0.2
pH, UA: 8.5

## 2010-10-28 LAB — HEPATIC FUNCTION PANEL
ALT: 27 U/L (ref 0–35)
AST: 26 U/L (ref 0–37)
Albumin: 4.7 g/dL (ref 3.5–5.2)
Alkaline Phosphatase: 72 U/L (ref 39–117)
Bilirubin, Direct: 0 mg/dL (ref 0.0–0.3)
Total Bilirubin: 0.5 mg/dL (ref 0.3–1.2)
Total Protein: 8 g/dL (ref 6.0–8.3)

## 2010-10-28 LAB — LDL CHOLESTEROL, DIRECT: Direct LDL: 131.9 mg/dL

## 2010-10-28 LAB — TSH: TSH: 0.65 u[IU]/mL (ref 0.35–5.50)

## 2010-10-28 LAB — HEMOGLOBIN A1C: Hgb A1c MFr Bld: 7 % — ABNORMAL HIGH (ref 4.6–6.5)

## 2010-10-28 MED ORDER — VITAMIN D (ERGOCALCIFEROL) 1.25 MG (50000 UNIT) PO CAPS: 50000.0000 [IU] | ORAL_CAPSULE | ORAL | Status: DC

## 2010-10-28 MED ORDER — FUROSEMIDE 20 MG PO TABS: 20.0000 mg | ORAL_TABLET | Freq: Every day | ORAL | Status: DC

## 2010-10-28 MED ORDER — ALPRAZOLAM 0.25 MG PO TABS: 0.2500 mg | ORAL_TABLET | Freq: Three times a day (TID) | ORAL | Status: DC | PRN

## 2010-10-28 NOTE — Progress Notes (Signed)
Subjective:    Patient ID: Patricia Moore, female    DOB: 28-Feb-1934, 77 y.o.   MRN: [id number]  HPI Patient is a 77 year old Hispanic female presents for complete physical examination.  Her chronic comorbid problems include hypertension diabetes and hyperlipidemia.  She has done well since her last office visit she continues to exercise her blood pressure is well-controlled and her weight is stable.  She states that she monitors her CBGs and they have ranged between 120 and 1:30 in the morning which should correlate with an A1c of approximately 6.5 she has no shortness of breath chest pain PND orthopnea generally she is doing well   Review of Systems  Constitutional: Negative for activity change, appetite change and fatigue.  HENT: Negative for ear pain, congestion, neck pain, postnasal drip and sinus pressure.   Eyes: Negative for redness and visual disturbance.  Respiratory: Negative for cough, shortness of breath and wheezing.   Gastrointestinal: Negative for abdominal pain and abdominal distention.  Genitourinary: Negative for dysuria, frequency and menstrual problem.  Musculoskeletal: Negative for myalgias, joint swelling and arthralgias.  Skin: Negative for rash and wound.  Neurological: Negative for dizziness, weakness and headaches.  Hematological: Negative for adenopathy. Does not bruise/bleed easily.  Psychiatric/Behavioral: Negative for sleep disturbance and decreased concentration.       Objective:   Physical Exam  Nursing note and vitals reviewed. Constitutional: She is oriented to person, place, and time. She appears well-developed and well-nourished. No distress.  HENT:  Head: Normocephalic and atraumatic.  Right Ear: External ear normal.  Left Ear: External ear normal.  Nose: Nose normal.  Mouth/Throat: Oropharynx is clear and moist.  Eyes: Conjunctivae and EOM are normal. Pupils are equal, round, and reactive to light.  Neck: Normal range of motion. Neck  supple. No JVD present. No tracheal deviation present. No thyromegaly present.  Cardiovascular: Normal rate, regular rhythm, normal heart sounds and intact distal pulses.   No murmur heard. Pulmonary/Chest: Effort normal and breath sounds normal. She has no wheezes. She exhibits no tenderness.  Abdominal: Soft. Bowel sounds are normal.  Musculoskeletal: Normal range of motion. She exhibits no edema and no tenderness.  Lymphadenopathy:    She has no cervical adenopathy.  Neurological: She is alert and oriented to person, place, and time. She has normal reflexes. No cranial nerve deficit.  Skin: Skin is warm and dry. She is not diaphoretic.       Her scalp has a 2 cm x 1.2 cm seborrheic lesion on the mid scalp above the occiput there are hair follicles embedded in the lesion  Psychiatric: She has a normal mood and affect. Her behavior is normal.          Assessment & Plan:   This is a routine physical examination for this healthy  Female. Reviewed all health maintenance protocols including mammography colonoscopy bone density and reviewed appropriate screening labs. Her immunization history was reviewed as well as her current medications and allergies refills of her chronic medications were given and the plan for yearly health maintenance was discussed all orders and referrals were made as appropriate.  Patient has a seborrheic lesion on her scalp recommended since she is concerned about that she have a tall just review the lesion but I believe it to be benign.  Her diabetes is apparently in good control but an A1c will be drawn today her cholesterol will be monitored by a lipid panel her blood pressure will be monitored by a basic metabolic panel we  will also get a C. reactive protein to confirm the effectiveness of our cholesterol-lowering.

## 2010-10-29 LAB — C-REACTIVE PROTEIN: CRP: 1 mg/dL — ABNORMAL HIGH (ref <0.6)

## 2010-11-29 ENCOUNTER — Other Ambulatory Visit: Payer: Self-pay | Admitting: Internal Medicine

## 2010-12-01 ENCOUNTER — Telehealth: Payer: Self-pay | Admitting: *Deleted

## 2010-12-01 ENCOUNTER — Ambulatory Visit: Payer: Medicare Other | Admitting: Family Medicine

## 2010-12-01 MED ORDER — CIPROFLOXACIN HCL 500 MG PO TABS: 500.0000 mg | ORAL_TABLET | Freq: Two times a day (BID) | ORAL | Status: AC

## 2010-12-01 MED ORDER — METRONIDAZOLE 500 MG PO TABS: 500.0000 mg | ORAL_TABLET | Freq: Two times a day (BID) | ORAL | Status: AC

## 2010-12-01 NOTE — Telephone Encounter (Signed)
Opened in error.......... See previous note. °

## 2010-12-01 NOTE — Telephone Encounter (Signed)
Per Dr. Lovell Sheehan:  Cipro 500 mg one po bid x 7 days                              Flagyl 500 my one po bid x 7 days  Diverticulitis as diagnosis.  Pt walked into office and spoke to Dr. Lovell Sheehan [id number] pain.

## 2011-01-27 ENCOUNTER — Encounter: Payer: Self-pay | Admitting: Internal Medicine

## 2011-01-27 ENCOUNTER — Ambulatory Visit (INDEPENDENT_AMBULATORY_CARE_PROVIDER_SITE_OTHER): Payer: Medicare Other | Admitting: Internal Medicine

## 2011-01-27 VITALS — BP 140/77 | HR 72 | Temp 98.1°F | Resp 16 | Ht 61.5 in | Wt 154.0 lb

## 2011-01-27 DIAGNOSIS — I10 Essential (primary) hypertension: Secondary | ICD-10-CM

## 2011-01-27 DIAGNOSIS — Z23 Encounter for immunization: Secondary | ICD-10-CM

## 2011-01-27 DIAGNOSIS — E119 Type 2 diabetes mellitus without complications: Secondary | ICD-10-CM

## 2011-01-27 DIAGNOSIS — E785 Hyperlipidemia, unspecified: Secondary | ICD-10-CM

## 2011-01-27 DIAGNOSIS — M1990 Unspecified osteoarthritis, unspecified site: Secondary | ICD-10-CM

## 2011-01-27 LAB — BASIC METABOLIC PANEL
BUN: 20 mg/dL (ref 6–23)
CO2: 32 mEq/L (ref 19–32)
Calcium: 10.1 mg/dL (ref 8.4–10.5)
Chloride: 103 mEq/L (ref 96–112)
Creatinine, Ser: 1 mg/dL (ref 0.4–1.2)
GFR: 60.94 mL/min (ref >60.00)
Glucose, Bld: 125 mg/dL — ABNORMAL HIGH (ref 70–99)
Potassium: 5.4 mEq/L — ABNORMAL HIGH (ref 3.5–5.1)
Sodium: 142 mEq/L (ref 135–145)

## 2011-01-27 LAB — HEMOGLOBIN A1C: Hgb A1c MFr Bld: 6.8 % — ABNORMAL HIGH (ref 4.6–6.5)

## 2011-01-27 NOTE — Progress Notes (Signed)
Subjective:    Patient ID: Patricia Moore, female    DOB: June 21, 1933, 77 y.o.   MRN: [id number]  HPI Weight up and blood glucose is up Patient states her CBGs have been in the 150 range Blood pressure however has been stable   Review of Systems  Constitutional: Positive for unexpected weight change.  Eyes: Negative.   Respiratory: Negative.   Cardiovascular: Negative.   Gastrointestinal: Positive for abdominal distention.  Genitourinary: Negative.        Objective:   Physical Exam  Nursing note and vitals reviewed. Constitutional: She appears well-developed and well-nourished.  HENT:  Head: Normocephalic and atraumatic.  Eyes: Conjunctivae normal are normal. Pupils are equal, round, and reactive to light.  Cardiovascular: Normal rate and regular rhythm.   Murmur heard. Pulmonary/Chest: Effort normal and breath sounds normal.  Abdominal: Soft. Bowel sounds are normal.          Assessment & Plan:  Stable  blood pressure with recent loss of diabetic control DT diet change and Change and exercise.  When we emphasized the need to walk on a daily basis continue glyburide/metformin at this time but consider increasing it to 5 500 if her blood sugars do not respond to diet and exercise change

## 2011-01-27 NOTE — Patient Instructions (Signed)
Patient was instructed to continue all medications as prescribed. To stop at the checkout desk and schedule a followup appointment  

## 2011-02-01 ENCOUNTER — Other Ambulatory Visit: Payer: Self-pay | Admitting: Internal Medicine

## 2011-02-06 ENCOUNTER — Encounter: Payer: Self-pay | Admitting: Gynecology

## 2011-02-06 ENCOUNTER — Ambulatory Visit (INDEPENDENT_AMBULATORY_CARE_PROVIDER_SITE_OTHER): Payer: Medicare Other | Admitting: Gynecology

## 2011-02-06 VITALS — BP 132/84 | Ht 61.5 in | Wt 153.0 lb

## 2011-02-06 DIAGNOSIS — K921 Melena: Secondary | ICD-10-CM

## 2011-02-06 DIAGNOSIS — M549 Dorsalgia, unspecified: Secondary | ICD-10-CM

## 2011-02-06 DIAGNOSIS — R143 Flatulence: Secondary | ICD-10-CM

## 2011-02-06 DIAGNOSIS — R141 Gas pain: Secondary | ICD-10-CM

## 2011-02-06 DIAGNOSIS — R140 Abdominal distension (gaseous): Secondary | ICD-10-CM

## 2011-02-06 DIAGNOSIS — R1031 Right lower quadrant pain: Secondary | ICD-10-CM

## 2011-02-06 LAB — POC HEMOCCULT BLD/STL (OFFICE/1-CARD/DIAGNOSTIC): Fecal Occult Blood, POC: NEGATIVE

## 2011-02-06 NOTE — Progress Notes (Signed)
Addended byCammie Mcgee T on: 02/06/2011 02:26 PM   Modules accepted: Orders

## 2011-02-06 NOTE — Progress Notes (Signed)
Patient is a 77 year old gravida 2 para 1 AB 1 who presented to the office today stating that over the course of the past week and a half she's complain low back discomfort bloating sensation and pressure sensation in her anal area with bowel movements and also discomfort in the right lower quadrant. She did state that when she was wiping at one time she did notice a streak of blood in the tissue paper. She had a colonoscopy with a polyp removed in 2010. She does have 7 family members with history of colon cancer on her father's side. Dr. Has been her gastroenterologist. Review of her records indicated her recent bone mineral density study demonstrated that her lowest T score was -1.2 at the left femoral neck and she had normal Frax study. She had been on the past on Actonel and has been off for quite some time. Her mammogram was in January this year which was normal. Dr. Lovell Sheehan her primary physician. She has type 2 diabetes and hypertension see patient medication list.  Exam: Back: No CVA tenderness Abdomen: Soft mildly distended positive bowel sounds tenderness in the right lower quadrant area Pelvic: Bartholin urethra Skene glands atrophic changes Vagina: No gross lesions on inspection Cervix: No lesions or discharge Uterus: Difficult to assess due to patient's mild vaginismus Adnexa: Limited exam due to patient's vaginismus Rectal exam: Hemoccult testing done  Urinalysis today was negative  Assessment: Based on patient's symptoms described above she will be scheduled for an abdominal pelvic MRI we'll also order a CBC, comprehensive metabolic panel and wait for the results of the Hemoccult testing and plan a course of management pending on the above-mentioned test. Patient denied any change in weight or appetite and her bowel movements it usually been regular and no urinary dysfunction.

## 2011-02-07 LAB — COMPREHENSIVE METABOLIC PANEL
ALT: 23 U/L (ref 0–35)
AST: 20 U/L (ref 0–37)
Albumin: 4.8 g/dL (ref 3.5–5.2)
Alkaline Phosphatase: 87 U/L (ref 39–117)
BUN: 19 mg/dL (ref 6–23)
CO2: 24 mEq/L (ref 19–32)
Calcium: 10 mg/dL (ref 8.4–10.5)
Chloride: 103 mEq/L (ref 96–112)
Creat: 0.95 mg/dL (ref 0.50–1.10)
Glucose, Bld: 192 mg/dL — ABNORMAL HIGH (ref 70–99)
Potassium: 5.2 mEq/L (ref 3.5–5.3)
Sodium: 140 mEq/L (ref 135–145)
Total Bilirubin: 0.3 mg/dL (ref 0.3–1.2)
Total Protein: 7.7 g/dL (ref 6.0–8.3)

## 2011-02-09 ENCOUNTER — Telehealth: Payer: Self-pay | Admitting: *Deleted

## 2011-02-09 NOTE — Telephone Encounter (Signed)
Patient informed appointment for MRI set up at [facility] on 02/15/11 @8 :45 and to be NPO after midnight.  Both studies were approved through insurance.

## 2011-02-09 NOTE — Progress Notes (Signed)
Addended by: Valeda Malm L on: 02/09/2011 10:01 AM   Modules accepted: Orders

## 2011-02-15 ENCOUNTER — Ambulatory Visit (HOSPITAL_COMMUNITY)
Admission: RE | Admit: 2011-02-15 | Discharge: 2011-02-15 | Payer: Medicare Other | Source: Ambulatory Visit | Attending: Gynecology | Admitting: Gynecology

## 2011-02-15 ENCOUNTER — Telehealth: Payer: Self-pay | Admitting: Gynecology

## 2011-02-15 ENCOUNTER — Ambulatory Visit (HOSPITAL_COMMUNITY)
Admission: RE | Admit: 2011-02-15 | Discharge: 2011-02-15 | Disposition: A | Discharge status: Home / Self Care | Payer: Medicare Other | Source: Ambulatory Visit | Attending: Gynecology | Admitting: Gynecology

## 2011-02-15 DIAGNOSIS — R140 Abdominal distension (gaseous): Secondary | ICD-10-CM

## 2011-02-15 DIAGNOSIS — D259 Leiomyoma of uterus, unspecified: Secondary | ICD-10-CM | POA: Insufficient documentation

## 2011-02-15 DIAGNOSIS — K5730 Diverticulosis of large intestine without perforation or abscess without bleeding: Secondary | ICD-10-CM | POA: Insufficient documentation

## 2011-02-15 MED ORDER — GADOBENATE DIMEGLUMINE 529 MG/ML IV SOLN
15.0000 mL | Freq: Once | INTRAVENOUS | Status: AC
  Administered 2011-02-15: 15 mL via INTRAVENOUS

## 2011-02-15 NOTE — Telephone Encounter (Signed)
The patient was contacted today to inform her of the results were done recently as a result of her, no bloating heaviness and right lower quadrant pain she did experience for a few days. Dr. Marina Goodell has been her gastroenterologist and her last colonoscopy was approximately 2 years ago. She did state at one time she wiped she noted some blood that was an isolated event. When she was seen in the office last week we had done a fecal occult blood test in the office which was negative. We had done her comprehensive metabolic panel and the only abnormality was her blood sugar was elevated at 192. Dr. Lovell Sheehan is patient primary physician and had recently increased her Glucophage 2 twice a day. Her urinalysis was negative her CBC was normal and we had ordered an MRI of the abdomen and pelvis which were all negative. Some mild diverticulosis was noted but no evidence of any diverticulitis. It was recommended that she followup with Dr. Marina Goodell. No abnormality from the GYN standpoint and she will need further evaluation by her gastroenterologist and/or and her primary physician.

## 2011-02-20 ENCOUNTER — Encounter: Payer: Self-pay | Admitting: *Deleted

## 2011-02-20 NOTE — Progress Notes (Unsigned)
  Pt informed with elevated blood sugar at 192 and normal potassium level. She will follow up with dr. Lovell Sheehan as recommended.

## 2011-03-18 DIAGNOSIS — C4492 Squamous cell carcinoma of skin, unspecified: Secondary | ICD-10-CM

## 2011-03-18 HISTORY — DX: Squamous cell carcinoma of skin, unspecified: C44.92

## 2011-04-28 ENCOUNTER — Other Ambulatory Visit: Payer: Self-pay | Admitting: *Deleted

## 2011-04-28 DIAGNOSIS — M8580 Other specified disorders of bone density and structure, unspecified site: Secondary | ICD-10-CM

## 2011-05-04 ENCOUNTER — Other Ambulatory Visit: Payer: Self-pay | Admitting: Internal Medicine

## 2011-05-08 ENCOUNTER — Encounter: Payer: Self-pay | Admitting: Gynecology

## 2011-05-09 ENCOUNTER — Other Ambulatory Visit: Payer: Self-pay | Admitting: Gynecology

## 2011-05-09 DIAGNOSIS — M8580 Other specified disorders of bone density and structure, unspecified site: Secondary | ICD-10-CM

## 2011-05-15 ENCOUNTER — Ambulatory Visit (INDEPENDENT_AMBULATORY_CARE_PROVIDER_SITE_OTHER): Payer: Medicare Other | Admitting: Gynecology

## 2011-05-15 ENCOUNTER — Encounter: Payer: Self-pay | Admitting: Gynecology

## 2011-05-15 VITALS — BP 122/72 | Ht 61.3 in | Wt 154.0 lb

## 2011-05-15 DIAGNOSIS — D259 Leiomyoma of uterus, unspecified: Secondary | ICD-10-CM

## 2011-05-15 DIAGNOSIS — Z1211 Encounter for screening for malignant neoplasm of colon: Secondary | ICD-10-CM

## 2011-05-15 DIAGNOSIS — D219 Benign neoplasm of connective and other soft tissue, unspecified: Secondary | ICD-10-CM

## 2011-05-15 DIAGNOSIS — R102 Pelvic and perineal pain: Secondary | ICD-10-CM

## 2011-05-15 DIAGNOSIS — N949 Unspecified condition associated with female genital organs and menstrual cycle: Secondary | ICD-10-CM

## 2011-05-15 DIAGNOSIS — O26899 Other specified pregnancy related conditions, unspecified trimester: Secondary | ICD-10-CM

## 2011-05-15 DIAGNOSIS — Z113 Encounter for screening for infections with a predominantly sexual mode of transmission: Secondary | ICD-10-CM

## 2011-05-15 NOTE — Patient Instructions (Signed)
Control del colesterol  Los niveles de colesterol en el organismo estn determinados significativamente por su dieta. Los niveles de colesterol tambin se relacionan con la enfermedad cardaca. El material que sigue ayuda a explicar esta relacin y a analizar qu puede hacer para mantener su corazn sano. No todo el colesterol es malo. Las lipoprotenas de baja densidad (LDL) forman el colesterol "malo". El colesterol malo puede ocasionar depsitos de grasa que se acumulan en el interior de las arterias. Las lipoprotenas de alta densidad (HDL) es el colesterol "bueno". Ayuda a remover el colesterol LDL "malo" de la sangre. El colesterol es un factor de riesgo muy importante para la enfermedad cardaca. Otros factores de riesgo son la hipertensin arterial, el hbito de fumar, el estrs, la herencia y el peso.   El msculo cardaco obtiene el suministro de sangre a travs de las arterias coronarias. Si su colesterol LDL ("malo") est elevado y el HDL ("bueno") es bajo, tiene un factor de riesgo para que se formen depsitos de grasa en las arterias coronarias (los vasos sanguneos que suministran sangre al corazn). Esto hace que haya menos lugar para que la sangre circule. Sin la suficiente sangre y oxgeno, el msculo cardaco no puede funcionar correctamente, y usted podr sentir dolores en el pecho (angina pectoris). Cuando una arteria coronaria se cierra completamente, una parte del msculo cardaco puede morir (infarto de miocardio).  CONTROL DEL COLESTEROL Cuando el profesional que lo asiste enva la sangre al laboratorio para conocer el nivel de colesterol, puede realizarle tambin un perfil completo de los lpidos. Con esta prueba, se puede determinar la cantidad total de colesterol, as como los niveles de LDL y HDL. Los triglicridos son un tipo de grasa que circula en la sangre y que tambin puede utilizarse para determinar el riesgo de enfermedad  cardaca. En la siguiente tabla se establecen los nmeros ideales: Prueba: Colesterol total  Menos de 200 mg/dl.  Prueba: LDL "colesterol malo"  Menos de 100 mg/dl.   Menos de 70 mg/dl si tiene riesgo muy elevado de sufrir un ataque cardaco o muerte cardaca sbita.  Prueba: HDL "colesterol bueno"  Mujeres: Ms de 50 mg/dl.   Hombres: Ms de 40 mg/dl.  Prueba: Trigliceridos  Menos de 150 mg/dl.    CONTROL DEL COLESTEROL CON DIETA Aunque factores como el ejercicio y el estilo de vida son importantes, la "primera lnea de ataque" es la dieta. Esto se debe a que se sabe que ciertos alimentos hacen subir el colesterol y otros lo bajan. El objetivo debe ser equilibrar los alimentos, de modo que tengan un efecto sobre el colesterol y, an ms importante, reemplazar las grasas saturadas y trans con otros tipos de grasas, como las monoinsaturadas y las poliinsaturadas y cidos grasos omega-3 . En promedio, una persona no debe consumir ms de 15 a 17 g de grasas saturadas por da. Las grasas saturadas y trans se consideran grasas "malas", ya que elevan el colesterol LDL. Las grasas saturadas se encuentran principalmente en productos animales como carne, manteca y crema. Pero esto no significa que usted debe sacrificar todas sus comidas favoritas. Actualmente, como lo muestra el cuadro que figura al final de este documento, hay sustitutos de buen sabor, bajos en grasas y en colesterol, para la mayora de los alimentos que a usted le gusta comer. Elija aquellos alimentos alternativos que sean bajos en grasas o sin grasas. Elija cortes de carne del cuarto trasero o lomo ya que estos cortes son los que tienen menor cantidad de grasa   y colesterol. El pollo (sin piel), el pescado, la carne de ternera, y la pechuga de pavo molida son excelentes opciones. Elimine las carnes grasosas como los hotdogs o el salami. Los mariscos tienen poco o nada de grasas saturadas. Cuando consuma carne magra, carne de aves de  corral, o pescado, hgalo en porciones de 85 gramos (3 onzas). Las grasas trans tambin se llaman "aceites parcialmente hidrogenados". Son aceites manipulados cientficamente de modo que son slidos a temperatura ambiente, tienen una larga vida y mejoran el sabor y la textura de los alimentos a los que se agregan. Las grasas trans se encuentran en la margarina, masitas, crackers y alimentos horneados.  Para hornear y cocinar, el aceite es un excelente sustituto para la mantequilla. Los aceites monoinsaturados tienen un beneficio particular, ya que se cree que disminuyen el colesterol LDL (colesterol malo) y elevan el HDL. Deber evitar los aceites tropicales saturados como el de coco y el de palma.  Recuerde, adems, que puede comer sin restricciones los grupos de alimentos que son naturalmente libres de grasas saturadas y grasas trans, entre los que se incluyen el pescado, las frutas (excepto el aguacate), verduras, frijoles, cereales (cebada, arroz, cuzcuz, trigo) y las pastas (sin salsas con crema)   IDENTIFIQUE LOS ALIMENTOS QUE DISMINUYEN EL COLESTEROL  Pueden disminuir el colesterol las fibras solubles que estn en las frutas, como las manzanas, en los vegetales como el brcoli, las patatas y las zanahorias; en las legumbres como frijoles, guisantes y lentejas; y en los cereales como la cebada. Los alimentos fortificados con fitosteroles tambin pueden disminuir el colesterol. Debe consumir al menos 2 g de estos alimentos a diario para obtener el efecto de disminucin de colesterol.  En el supermercado, lea las etiquetas de los envases para identificar los alimentos bajos en grasas saturadas, libres de grasas trans y bajos en grasas, . Elija quesos que tengan solo de 2 a 3 g de grasa saturada por onza (28,35 g). Use una margarina que no dae el corazn, libre de grasas trans o aceite parcialmente hidrogenado. Al comprar alimentos horneados (galletitas dulces y galletas) evite el aceite parcialmente  hidrogenado. Los panes y bollos debern ser de granos enteros (harina de maz o de avena entera, en lugar de "harina" o "harina enriquecida"). Compre sopas en lata que no sean cremosas, con bajo contenido de sal y sin grasas adicionadas.   TCNICAS DE PREPARACIN DE LOS ALIMENTOS  Nunca fra los alimentos en aceite abundante. Si debe frer, hgalo en poco aceite y removiendo constantemente, porque as se utilizan muy pocas grasas, o utilice un spray antiadherente. Cuando le sea posible, hierva, hornee o ase las carnes y cocine los vegetales al vapor. En vez de aderezar los vegetales con mantequilla o margarina, utilice limn y hierbas, pur de manzanas y canela (para las calabazas y batatas), yogurt y salsa descremados y aderezos para ensaladas bajos en contenido graso.   BAJO EN GRASAS SATURADAS / SUSTITUTOS BAJOS EN GRASA  Carnes / Grasas saturadas (g)  Evite: Bife, corte graso (3 oz/85 g) / 11 g   Elija: Bife, corte magro (3 oz/85 g) / 4 g   Evite: Hamburguesa (3 oz/85 g) / 7 g   Elija:  [place]amb[place]a magra (3 oz/85 g) / 5 g   Evite: Jamn (3 oz/85 g) / 6 g   Elija:  Jamn magro (3 oz/85 g) / 2.4 g   Evite: Pollo, con piel (3 oz/85 g), Carne oscura / 4 g   Elija:  Pollo, sin piel (  3 oz/85 g), Carne oscura / 2 g   Evite: Pollo, con piel (3 oz/85 g), Carne magra / 2.5 g   Elija: Pollo, sin piel (3 oz/85 g), Carne magra / 1 g  Lcteos / Grasas saturadas (g)  Evite: Leche entera (1 taza) / 5 g   Elija: Leche con bajo contenido de grasa, 2% (1 taza) / 3 g   Elija: Leche con bajo contenido de grasa, 1% (1 taza) / 1.5 g   Elija: Leche descremada (1 taza) / 0.3 g   Evite: Queso duro (1 oz/28 g) / 6 g   Elija: Queso descremado (1 oz/28 g) / 2-3 g   Evite: Queso cottage, 4% grasa (1 taza)/ 6.5 g   Elija: Queso cottage con bajo contenido de grasa, 1% grasa (1 taza)/ 1.5 g   Evite: Helado (1 taza) / 9 g   Elija: Sorbete (1 taza) / 2.5 g   Elija: Yogurt helado sin contenido de  grasa (1 taza) / 0.3 g   Elija: Barras de fruta congeladas / vestigios   Evite: Crema batida (1 cucharada) / 3.5 g   Elija: Batidos glac sin lcteos (1 cucharada) / 1 g  Condimentos / Grasas saturadas (g)  Evite: Mayonesa (1 cucharada) / 2 g   Elija: Mayonesa con bajo contenido de grasa (1 cucharada) / 1 g   Evite: Manteca (1 cucharada) / 7 g   Elija: Margarina extra light (1 cucharada) / 1 g   Evite: Aceite de coco (1 cucharada) / 11.8 g   Elija: Aceite de oliva (1 cucharada) / 1.8 g   Elija: Aceite de maz (1 cucharada) / 1.7 g   Elija: Aceite de crtamo (1 cucharada) / 1.2 g   Elija: Aceite de girasol (1 cucharada) / 1.4 g   Elija: Aceite de soja (1 cucharada) / 2.4 g   Elija: Aceite de canola (1 cucharada) / 1 g  Document Released: 04/03/2005 Document Revised: 12/14/2010 [employer] Patient Information 2012 [employer]. Ejercicios para perder peso (Exercise to Lose Weight) La actividad fsica y una dieta saludable ayudan a perder peso. El mdico podr sugerirle ejercicios especficos. IDEAS Y CONSEJOS PARA HACER EJERCICIOS  Elija opciones econmicas que disfrute hacer , como caminar, andar en bicicleta o los vdeos para ejercitarse.   Utilice las escaleras en lugar del ascensor.   Camine durante la hora del almuerzo.   Estacione el auto lejos del lugar de trabajo o estudio.   Concurra a un gimnasio o tome clases de gimnasia.   Comience con 5  10 minutos de actividad fsica por da. Ejercite hasta 30 minutos, 4 a 6 das por semana.   Utilice zapatos que tengan un buen soporte y ropas cmodas.   Elongue antes y despus de ejercitar.   Ejercite hasta que aumente la respiracin y el corazn palpite rpido.   Beba agua extra cuando ejercite.   No haga ejercicio hasta lastimarse, sentirse mareado o que le falte mucho el aire.  La actividad fsica puede quemar alrededor de 150 caloras.  Correr 20 cuadras en 15 minutos.   Jugar vley durante 45 a 60  minutos.   Limpiar y encerar el auto durante 45 a 60 minutos.   Jugar ftbol americano de toque.   Caminar 25 cuadras en 35 minutos.   Empujar un cochecito 20 cuadras en 30 minutos.   Jugar baloncesto durante 30 minutos.   Rastrillar hojas secas durante 30 minutos.   Andar en bicicleta 80 cuadras en 30 minutos.     Caminar 30 cuadras en 30 minutos.   Bailar durante 30 minutos.   Quitar la nieve con una pala durante 15 minutos.   Nadar vigorosamente durante 20 minutos.   Subir escaleras durante 15 minutos.   Andar en bicicleta 60 cuadras durante 15 minutos.   Arreglar el jardn entre 30 y 45 minutos.   Saltar a la soga durante 15 minutos.   Limpiar vidrios o pisos durante 45 a 60 minutos.  Document Released: 07/08/2010 Document Revised: 12/14/2010 [employer] Patient Information 2012 [employer]. 

## 2011-05-15 NOTE — Progress Notes (Signed)
Patricia Moore 1934-03-11 [id number]   History:    78 y.o.  for GYN exam. Patient with over a year of nonspecific right lower quadrant discomfort. She had an abdominal pelvic CT scan last year which reconfirmed only her small fibroids. Her ultrasound done January 2012 had demonstrated 4 fibroids the largest one measuring 36 x 31 x 36 mm and atrophic right ovary left ovary had not been seen. Patient with past history of colonic polyp removed in 2010. She has been followed by Dr. Marina Goodell gastroenterologist. Patient has a proximally 7 family members with colon cancer. Review of patient's records indicated she was taking vitamin D 50,000 units q. weekly I've recommended that she decreased at 2 2000 units daily. She is going to give her lab work done by her primary physician Dr. Lovell Sheehan in the next couple weeks and he will be checking her vitamin D level as well. Her bone density study was in July of 2011 her lowest T score was at the left femoral neck -1.2 stable when compared to previous study. Patient has been on a drug-free holiday since she was on Actonel for many years. Her mammogram was in January this year which was normal. Patient does her monthly self breast examination.  Past medical history,surgical history, family history and social history were all reviewed and documented in the EPIC chart.  Gynecologic History Patient's last menstrual period was 04/17/1988. Contraception: none Last Pap: 2012. Results were: normal Last mammogram: 2013. Results were: normal  Obstetric History OB History    Grav Para Term Preterm Abortions TAB SAB Ect Mult Living   2 1   1  1   1      # Outc Date GA Lbr Len/2nd Wgt Sex Del Anes PTL Lv   1 PAR            2 SAB                ROS:  Was performed and pertinent positives and negatives are included in the history.  Exam: chaperone present  BP 122/72  Ht 5' 1.3" (1.557 m)  Wt 154 lb (69.854 kg)  BMI 28.81 kg/m2  LMP 04/17/1988  Body mass index is 28.81  kg/(m^2).  General appearance : Well developed well nourished female. No acute distress HEENT: Neck supple, trachea midline, no carotid bruits, no thyroidmegaly Lungs: Clear to auscultation, no rhonchi or wheezes, or rib retractions  Heart: Regular rate and rhythm, no murmurs or gallops Breast:Examined in sitting and supine position were symmetrical in appearance, no palpable masses or tenderness,  no skin retraction, no nipple inversion, no nipple discharge, no skin discoloration, no axillary or supraclavicular lymphadenopathy Abdomen: no palpable masses or tenderness, no rebound or guarding Extremities: no edema or skin discoloration or tenderness  Pelvic:  Bartholin, Urethra, Skene Glands: Within normal limits             Vagina: No gross lesions or discharge  Cervix: No gross lesions or discharge  Uterus  axial, normal size, shape and consistency, non-tender and mobile  Adnexa  Without masses or tenderness  Anus and perineum  normal   Rectovaginal  normal sphincter tone without palpated masses or tenderness             Hemoccult obtained results pending at time of this dictation     Assessment/Plan:  78 y.o. female with normal GYN exam. We discussed a new screening guidelines with Pap smears. Since she is over 24 years of age and has  never had an abnormal Pap smear she will no longer needs Pap smears. She will get her lab work done with Dr. Lovell Sheehan the next couple weeks and he will be checking her vitamin D level as well. He has been following her for type 2 diabetes and hypertension. Fecal occult blood testing was done today. She was encouraged to do her monthly self breast examination. She was instructed to stop taking vitamin D 50,000 units q. weekly and to continue to take 2000 units q. daily instead along with her calcium. She was encouraged to engage in weightbearing exercises 3 or 4 times a week for osteoporosis prevention. She is to contact Dr. Marina Goodell her gastroenterologist because  of her strong family history of colon cancer in her having had colonic polyps in 2010 for the next followup colonoscopy.   Ok Edwards MD, 1:54 PM 05/15/2011

## 2011-05-16 LAB — POC HEMOCCULT BLD/STL (OFFICE/1-CARD/DIAGNOSTIC): Fecal Occult Blood, POC: NEGATIVE

## 2011-05-22 ENCOUNTER — Ambulatory Visit (INDEPENDENT_AMBULATORY_CARE_PROVIDER_SITE_OTHER): Payer: Medicare Other | Admitting: Internal Medicine

## 2011-05-22 ENCOUNTER — Encounter: Payer: Self-pay | Admitting: Internal Medicine

## 2011-05-22 VITALS — BP 140/76 | HR 72 | Temp 98.0°F | Resp 16 | Ht 61.0 in | Wt 151.0 lb

## 2011-05-22 DIAGNOSIS — I10 Essential (primary) hypertension: Secondary | ICD-10-CM

## 2011-05-22 DIAGNOSIS — B370 Candidal stomatitis: Secondary | ICD-10-CM

## 2011-05-22 DIAGNOSIS — IMO0001 Reserved for inherently not codable concepts without codable children: Secondary | ICD-10-CM

## 2011-05-22 MED ORDER — NYSTATIN 100000 UNIT/ML MT SUSP: 500000.0000 [IU] | Freq: Four times a day (QID) | OROMUCOSAL | Status: AC

## 2011-05-22 NOTE — Patient Instructions (Signed)
The patient is instructed to continue all medications as prescribed. Schedule followup with check out clerk upon leaving the clinic  

## 2011-05-22 NOTE — Progress Notes (Signed)
Subjective:    Patient ID: Patricia Moore, female    DOB: 05/13/1933, 78 y.o.   MRN: [id number]  HPI  Follow up DM and HTN CBG's stable She has noted tinnitus  Review of Systems  Constitutional: Negative for activity change, appetite change and fatigue.  HENT: Negative for ear pain, congestion, neck pain, postnasal drip and sinus pressure.   Eyes: Negative for redness and visual disturbance.  Respiratory: Negative for cough, shortness of breath and wheezing.   Gastrointestinal: Negative for abdominal pain and abdominal distention.  Genitourinary: Negative for dysuria, frequency and menstrual problem.  Musculoskeletal: Negative for myalgias, joint swelling and arthralgias.  Skin: Negative for rash and wound.  Neurological: Negative for dizziness, weakness and headaches.  Hematological: Negative for adenopathy. Does not bruise/bleed easily.  Psychiatric/Behavioral: Negative for sleep disturbance and decreased concentration.   Past Medical History  Diagnosis Date  . Allergy   . Hypertension   . Osteoporosis   . Diabetes mellitus   . Back pain   . Arthritis   . Hx of adenomatous colonic polyps   . Diverticulosis   . Hyperlipidemia   . H. pylori infection 2001  . Intraductal papilloma   . 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 Emily Filbert    History   Social History  . Marital Status: Married    Spouse Name: N/A    Number of Children: N/A  . Years of Education: N/A   Occupational History  . Not on file.   Social History Main Topics  . Smoking status: Never Smoker   . Smokeless tobacco: Never Used  . Alcohol Use: 0.0 oz/week     OCCASIONALLY  . Drug Use: No  . Sexually Active: No   Other Topics Concern  . Not on file   Social History Narrative  . No narrative on file    Past Surgical History  Procedure Date  . Breast biopsy '91, '95    INTRADUCTAL PAPILLOMA  . Breast lumpectomy   .  Esophagogastroduodenoscopy   . Colonoscopy 2001    ADENOMATOUS COLONIC  POLYP    Family History  Problem Relation Age of Onset  . Adopted: Yes  . Hypertension    . Diabetes    . Heart disease Mother   . Heart disease Father   . Breast cancer Maternal Aunt   . Stroke Maternal Aunt   . Colon cancer Paternal Uncle   . Cancer Maternal Grandfather     COLON,, FAMILY HX 7 MEMBERS    No Known Allergies  Current Outpatient Prescriptions on File Prior to Visit  Medication Sig Dispense Refill  . ALPRAZolam (XANAX) 0.25 MG tablet TAKE ONE TABLET BY MOUTH THREE TIMES DAILY AS NEEDED  30 tablet  5  . furosemide (LASIX) 20 MG tablet Take 1 tablet (20 mg total) by mouth daily.  30 tablet  11  . glyBURIDE-metformin (GLUCOVANCE) 2.5-500 MG per tablet Take 1 tablet by mouth 2 (two) times daily with a meal.        . GNP ZINC CHELATED PO Take 23 mg by mouth as needed.        . Lancets (ONETOUCH ULTRASOFT) lancets USE AS DIRECTED  300 each  6  . olmesartan (BENICAR) 40 MG tablet Take 40 mg by mouth daily.        . ONE TOUCH ULTRA TEST test strip TEST 4 TIMES DAILY  200 each  6  . vitamin  C (ASCORBIC ACID) 500 MG tablet Take 500 mg by mouth daily.        . Vitamin D, Ergocalciferol, (DRISDOL) 50000 UNITS CAPS Take 1 capsule (50,000 Units total) by mouth every 7 (seven) days.  5 capsule  3    BP 140/76  Pulse 72  Temp 98 F (36.7 C)  Resp 16  Ht 5\' 1"  (1.549 m)  Wt 151 lb (68.493 kg)  BMI 28.53 kg/m2  LMP 04/17/1988       Objective:   Physical Exam  Nursing note and vitals reviewed. Constitutional: She is oriented to person, place, and time. She appears well-developed and well-nourished. No distress.  HENT:  Head: Normocephalic and atraumatic.  Right Ear: External ear normal.  Left Ear: External ear normal.  Nose: Nose normal.  Mouth/Throat: Oropharynx is clear and moist.  Eyes: Conjunctivae and EOM are normal. Pupils are equal, round, and reactive to light.  Neck: Normal range of  motion. Neck supple. No JVD present. No tracheal deviation present. No thyromegaly present.  Cardiovascular: Normal rate, regular rhythm, normal heart sounds and intact distal pulses.   No murmur heard. Pulmonary/Chest: Effort normal and breath sounds normal. She has no wheezes. She exhibits no tenderness.  Abdominal: Soft. Bowel sounds are normal.  Musculoskeletal: Normal range of motion. She exhibits no edema and no tenderness.  Lymphadenopathy:    She has no cervical adenopathy.  Neurological: She is alert and oriented to person, place, and time. She has normal reflexes. No cranial nerve deficit.  Skin: Skin is warm and dry. She is not diaphoretic.  Psychiatric: She has a normal mood and affect. Her behavior is normal.          Assessment & Plan:  Monitoring HTN and she appears stable on the benicar Discussed the lasic and the interaction of lasix and tinnitus Weight management discussed  And gave 1500 cal meal plan reviewed labs Mild thrush prescribed nystatin swish and swallow

## 2011-06-05 ENCOUNTER — Other Ambulatory Visit: Payer: Self-pay | Admitting: Internal Medicine

## 2011-07-17 ENCOUNTER — Encounter: Payer: Self-pay | Admitting: Internal Medicine

## 2011-07-17 ENCOUNTER — Ambulatory Visit (INDEPENDENT_AMBULATORY_CARE_PROVIDER_SITE_OTHER): Payer: Medicare Other | Admitting: Internal Medicine

## 2011-07-17 VITALS — BP 144/80 | HR 76 | Temp 98.2°F | Resp 16 | Ht 61.5 in | Wt 152.0 lb

## 2011-07-17 DIAGNOSIS — J101 Influenza due to other identified influenza virus with other respiratory manifestations: Secondary | ICD-10-CM

## 2011-07-17 DIAGNOSIS — R21 Rash and other nonspecific skin eruption: Secondary | ICD-10-CM

## 2011-07-17 DIAGNOSIS — J111 Influenza due to unidentified influenza virus with other respiratory manifestations: Secondary | ICD-10-CM

## 2011-07-17 MED ORDER — OSELTAMIVIR PHOSPHATE 75 MG PO CAPS: 75.0000 mg | ORAL_CAPSULE | Freq: Two times a day (BID) | ORAL | Status: AC

## 2011-07-17 MED ORDER — DESONIDE 0.05 % EX CREA: TOPICAL_CREAM | Freq: Two times a day (BID) | CUTANEOUS | Status: AC

## 2011-07-17 NOTE — Patient Instructions (Signed)
The patient is instructed to continue all medications as prescribed. Schedule followup with check out clerk upon leaving the clinic  

## 2011-07-17 NOTE — Progress Notes (Signed)
Subjective:    Patient ID: Patricia Moore, female    DOB: 1934/02/04, 78 y.o.   MRN: [id number]  HPI Patient is a 78 year old female who presents for followup of her blood pressure and diabetes but has an acute complaint today of a headache.  Her headache is unusual for her and that she never complains of a headache and she does have a slight elevation of blood pressure which I think is secondary to her pain.  She has also had nasal congestion and postnasal drip with this.  She has an exposure history of being exposed to a grandchild who was diagnosed with influenza.  The symptomatology fits the pattern of early influenza A. and D. to her other risk factors I believe a course of anti-flu medication may be warranted for her.     Review of Systems  Constitutional: Negative for activity change, appetite change and fatigue.  HENT: Negative for ear pain, congestion, neck pain, postnasal drip and sinus pressure.   Eyes: Negative for redness and visual disturbance.  Respiratory: Negative for cough, shortness of breath and wheezing.   Gastrointestinal: Negative for abdominal pain and abdominal distention.  Genitourinary: Negative for dysuria, frequency and menstrual problem.  Musculoskeletal: Negative for myalgias, joint swelling and arthralgias.  Skin: Negative for rash and wound.  Neurological: Negative for dizziness, weakness and headaches.  Hematological: Negative for adenopathy. Does not bruise/bleed easily.  Psychiatric/Behavioral: Negative for sleep disturbance and decreased concentration.   Past Medical History  Diagnosis Date  . Allergy   . Hypertension   . Osteoporosis   . Diabetes mellitus   . Back pain   . Arthritis   . Hx of adenomatous colonic polyps   . Diverticulosis   . Hyperlipidemia   . H. pylori infection 2001  . Intraductal papilloma   . 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 Emily Filbert    History    Social History  . Marital Status: Married    Spouse Name: N/A    Number of Children: N/A  . Years of Education: N/A   Occupational History  . Not on file.   Social History Main Topics  . Smoking status: Never Smoker   . Smokeless tobacco: Never Used  . Alcohol Use: 0.0 oz/week     OCCASIONALLY  . Drug Use: No  . Sexually Active: No   Other Topics Concern  . Not on file   Social History Narrative  . No narrative on file    Past Surgical History  Procedure Date  . Breast biopsy '91, '95    INTRADUCTAL PAPILLOMA  . Breast lumpectomy   . Esophagogastroduodenoscopy   . Colonoscopy 2001    ADENOMATOUS COLONIC  POLYP    Family History  Problem Relation Age of Onset  . Adopted: Yes  . Hypertension    . Diabetes    . Heart disease Mother   . Heart disease Father   . Breast cancer Maternal Aunt   . Stroke Maternal Aunt   . Colon cancer Paternal Uncle   . Cancer Maternal Grandfather     COLON,, FAMILY HX 7 MEMBERS    No Known Allergies  Current Outpatient Prescriptions on File Prior to Visit  Medication Sig Dispense Refill  . ALPRAZolam (XANAX) 0.25 MG tablet TAKE ONE TABLET BY MOUTH THREE TIMES DAILY AS NEEDED  30 tablet  5  . furosemide (LASIX) 20 MG tablet Take 1 tablet (20 mg total)  by mouth daily.  30 tablet  11  . glyBURIDE-metformin (GLUCOVANCE) 2.5-500 MG per tablet TAKE ONE TABLET BY MOUTH TWICE DAILY.  60 tablet  11  . GNP ZINC CHELATED PO Take 23 mg by mouth as needed.        . Lancets (ONETOUCH ULTRASOFT) lancets USE AS DIRECTED  300 each  6  . olmesartan (BENICAR) 40 MG tablet Take 40 mg by mouth daily.        . ONE TOUCH ULTRA TEST test strip TEST 4 TIMES DAILY  200 each  6  . vitamin C (ASCORBIC ACID) 500 MG tablet Take 500 mg by mouth daily.        . Vitamin D, Ergocalciferol, (DRISDOL) 50000 UNITS CAPS Take 1 capsule (50,000 Units total) by mouth every 7 (seven) days.  5 capsule  3    BP 144/80  Pulse 76  Temp 98.2 F (36.8 C)  Resp 16  Ht  5' 1.5" (1.562 m)  Wt 152 lb (68.947 kg)  BMI 28.26 kg/m2  LMP 04/17/1988       Objective:   Physical Exam  Constitutional: She is oriented to person, place, and time. She appears well-developed and well-nourished. No distress.  HENT:  Head: Normocephalic and atraumatic.       erythema  Eyes: Conjunctivae and EOM are normal. Pupils are equal, round, and reactive to light.  Neck: Normal range of motion. Neck supple. No JVD present. No tracheal deviation present. No thyromegaly present.  Cardiovascular: Normal rate, regular rhythm, normal heart sounds and intact distal pulses.   No murmur heard. Pulmonary/Chest: Effort normal and breath sounds normal. She has no wheezes. She exhibits no tenderness.  Abdominal: Soft. Bowel sounds are normal.  Musculoskeletal: Normal range of motion. She exhibits no edema and no tenderness.  Lymphadenopathy:    She has no cervical adenopathy.  Neurological: She is alert and oriented to person, place, and time. She has normal reflexes. No cranial nerve deficit.  Skin: Skin is warm and dry. She is not diaphoretic.  Psychiatric: She has a normal mood and affect. Her behavior is normal.          Assessment & Plan:  We will treat her with Tamiflu 75 mg twice daily for 7 days Her blood pressure elevation I believe secondary to the headache pain For the headache pain I recommend ibuprofen 200-400 mg every 4-6 hours

## 2011-08-08 ENCOUNTER — Other Ambulatory Visit: Payer: Self-pay | Admitting: Internal Medicine

## 2011-08-16 ENCOUNTER — Ambulatory Visit: Payer: Medicare Other | Admitting: Internal Medicine

## 2011-08-23 ENCOUNTER — Ambulatory Visit (INDEPENDENT_AMBULATORY_CARE_PROVIDER_SITE_OTHER): Payer: Medicare Other | Admitting: Internal Medicine

## 2011-08-23 ENCOUNTER — Other Ambulatory Visit: Payer: Self-pay | Admitting: *Deleted

## 2011-08-23 ENCOUNTER — Encounter: Payer: Self-pay | Admitting: Internal Medicine

## 2011-08-23 VITALS — BP 140/80 | HR 76 | Temp 98.2°F | Resp 16 | Ht 61.5 in | Wt 152.0 lb

## 2011-08-23 DIAGNOSIS — M549 Dorsalgia, unspecified: Secondary | ICD-10-CM

## 2011-08-23 DIAGNOSIS — E119 Type 2 diabetes mellitus without complications: Secondary | ICD-10-CM

## 2011-08-23 DIAGNOSIS — M62830 Muscle spasm of back: Secondary | ICD-10-CM

## 2011-08-23 DIAGNOSIS — M539 Dorsopathy, unspecified: Secondary | ICD-10-CM

## 2011-08-23 MED ORDER — CAPSAICIN 0.025 % EX CREA: TOPICAL_CREAM | Freq: Two times a day (BID) | CUTANEOUS | Status: DC

## 2011-08-23 MED ORDER — TRAMADOL HCL 50 MG PO TABS: 50.0000 mg | ORAL_TABLET | Freq: Three times a day (TID) | ORAL | Status: AC | PRN

## 2011-08-23 MED ORDER — VITAMIN D (ERGOCALCIFEROL) 1.25 MG (50000 UNIT) PO CAPS: 50000.0000 [IU] | ORAL_CAPSULE | ORAL | Status: DC

## 2011-08-23 MED ORDER — TIZANIDINE HCL 4 MG PO TABS: 4.0000 mg | ORAL_TABLET | Freq: Four times a day (QID) | ORAL | Status: AC | PRN

## 2011-08-23 MED ORDER — METHYLPREDNISOLONE 4 MG PO KIT: PACK | ORAL | Status: AC

## 2011-08-23 NOTE — Progress Notes (Signed)
Subjective:    Patient ID: Patricia Moore, female    DOB: 12-11-1933, 78 y.o.   MRN: [id number]  HPI patient a 78 year old female who presents with an acute complaint of back pain.  She had a complicated course in that several weeks ago when she was in [place] she had abdominal pain in the left lower quadrant was treated with ciprofloxacin and that pain resolved she was lifting some heavy equipment with her husband and did not have acute pain but now that she's got back home she has pain that radiates down her left leg. The pain is worse when she lies down she has to bend her knees get up.  She does have a history of back pain She has an x-ray approximately a year ago did show spondylosis in her back she does not have any disc space narrowing or any history of compression fracture.  She rates the pain as 6 on a scale of 1-10    Review of Systems  Constitutional: Negative for activity change, appetite change and fatigue.  HENT: Negative for ear pain, congestion, neck pain, postnasal drip and sinus pressure.   Eyes: Negative for redness and visual disturbance.  Respiratory: Negative for cough, shortness of breath and wheezing.   Gastrointestinal: Negative for abdominal pain and abdominal distention.  Genitourinary: Negative for dysuria, frequency and menstrual problem.  Musculoskeletal: Negative for myalgias, joint swelling and arthralgias.  Skin: Negative for rash and wound.  Neurological: Negative for dizziness, weakness and headaches.  Hematological: Negative for adenopathy. Does not bruise/bleed easily.  Psychiatric/Behavioral: Negative for sleep disturbance and decreased concentration.       Objective:   Physical Exam  Nursing note and vitals reviewed. Constitutional: She is oriented to person, place, and time. She appears well-developed and well-nourished. No distress.  HENT:  Head: Normocephalic and atraumatic.  Right Ear: External ear normal.  Left Ear: External ear  normal.  Nose: Nose normal.  Mouth/Throat: Oropharynx is clear and moist.  Eyes: Conjunctivae and EOM are normal. Pupils are equal, round, and reactive to light.  Neck: Normal range of motion. Neck supple. No JVD present. No tracheal deviation present. No thyromegaly present.  Cardiovascular: Normal rate, regular rhythm, normal heart sounds and intact distal pulses.   No murmur heard. Pulmonary/Chest: Effort normal and breath sounds normal. She has no wheezes. She exhibits no tenderness.  Abdominal: Soft. Bowel sounds are normal.  Musculoskeletal: Normal range of motion. She exhibits no edema and no tenderness.       The patient has point tenderness along the paraspinous muscles on the left back.    Lymphadenopathy:    She has no cervical adenopathy.  Neurological: She is alert and oriented to person, place, and time. She has normal reflexes. No cranial nerve deficit.  Skin: Skin is warm and dry. She is not diaphoretic.  Psychiatric: She has a normal mood and affect. Her behavior is normal.          Assessment & Plan:  Acute back injury probably musculoskeletal strain.  Place her on a Medrol Dosepak for 7 days a muscle relaxant Zanaflex 4 mg by mouth twice a day and Ultram 50 mg by mouth 3 times a day for pain.  She also wishes a prescription for topical antiarthritic cream Zostrix.

## 2011-08-23 NOTE — Patient Instructions (Signed)
Back Exercises Back exercises help treat and prevent back injuries. The goal of back exercises is to increase the strength of your abdominal and back muscles and the flexibility of your back. These exercises should be started when you no longer have back pain. Back exercises include:  Pelvic Tilt. Lie on your back with your knees bent. Tilt your pelvis until the lower part of your back is against the floor. Hold this position 5 to 10 sec and repeat 5 to 10 times.   Knee to Chest. Pull first 1 knee up against your chest and hold for 20 to 30 seconds, repeat this with the other knee, and then both knees. This may be done with the other leg straight or bent, whichever feels better.   Sit-Ups or Curl-Ups. Bend your knees 90 degrees. Start with tilting your pelvis, and do a partial, slow sit-up, lifting your trunk only 30 to 45 degrees off the floor. Take at least 2 to 3 seconds for each sit-up. Do not do sit-ups with your knees out straight. If partial sit-ups are difficult, simply do the above but with only tightening your abdominal muscles and holding it as directed.   Hip-Lift. Lie on your back with your knees flexed 90 degrees. Push down with your feet and shoulders as you raise your hips a couple inches off the floor; hold for 10 seconds, repeat 5 to 10 times.   Back arches. Lie on your stomach, propping yourself up on bent elbows. Slowly press on your hands, causing an arch in your low back. Repeat 3 to 5 times. Any initial stiffness and discomfort should lessen with repetition over time.   Shoulder-Lifts. Lie face down with arms beside your body. Keep hips and torso pressed to floor as you slowly lift your head and shoulders off the floor.  Do not overdo your exercises, especially in the beginning. Exercises may cause you some mild back discomfort which lasts for a few minutes; however, if the pain is more severe, or lasts for more than 15 minutes, do not continue exercises until you see your  caregiver. Improvement with exercise therapy for back problems is slow.  See your caregivers for assistance with developing a proper back exercise program. Document Released: 05/11/2004 Document Revised: 03/23/2011 Document Reviewed: 04/03/2005 [employer] Patient Information 2012 [employer]. 

## 2011-10-02 ENCOUNTER — Ambulatory Visit (INDEPENDENT_AMBULATORY_CARE_PROVIDER_SITE_OTHER): Payer: Medicare Other | Admitting: Internal Medicine

## 2011-10-02 ENCOUNTER — Encounter: Payer: Self-pay | Admitting: Internal Medicine

## 2011-10-02 VITALS — BP 126/78 | HR 72 | Temp 98.3°F | Resp 16 | Ht 61.5 in | Wt 148.0 lb

## 2011-10-02 DIAGNOSIS — E119 Type 2 diabetes mellitus without complications: Secondary | ICD-10-CM

## 2011-10-02 DIAGNOSIS — I10 Essential (primary) hypertension: Secondary | ICD-10-CM

## 2011-10-02 DIAGNOSIS — E785 Hyperlipidemia, unspecified: Secondary | ICD-10-CM

## 2011-10-02 DIAGNOSIS — Z0000 Encounter for general adult medical examination without abnormal findings: Secondary | ICD-10-CM

## 2011-10-02 NOTE — Progress Notes (Signed)
Subjective:    Patient ID: Patricia Moore, female    DOB: 05/26/1933, 78 y.o.   MRN: [id number]  HPI Pt has glaucoma and a cataract and questions the possibility of eye surgery. Laser surgery planned Sight improved with the drops Blood sugars are better Blood pressure stable   Review of Systems  Constitutional: Negative for activity change, appetite change and fatigue.  HENT: Negative for ear pain, congestion, neck pain, postnasal drip and sinus pressure.   Eyes: Negative for redness and visual disturbance.  Respiratory: Negative for cough, shortness of breath and wheezing.   Gastrointestinal: Negative for abdominal pain and abdominal distention.  Genitourinary: Negative for dysuria, frequency and menstrual problem.  Musculoskeletal: Negative for myalgias, joint swelling and arthralgias.  Skin: Negative for rash and wound.  Neurological: Negative for dizziness, weakness and headaches.  Hematological: Negative for adenopathy. Does not bruise/bleed easily.  Psychiatric/Behavioral: Negative for disturbed wake/sleep cycle and decreased concentration.   Past Medical History  Diagnosis Date  . Allergy   . Hypertension   . Osteoporosis   . Diabetes mellitus   . Back pain   . Arthritis   . Hx of adenomatous colonic polyps   . Diverticulosis   . Hyperlipidemia   . H. pylori infection 2001  . Intraductal papilloma   . 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 Emily Filbert    History   Social History  . Marital Status: Married    Spouse Name: N/A    Number of Children: N/A  . Years of Education: N/A   Occupational History  . Not on file.   Social History Main Topics  . Smoking status: Never Smoker   . Smokeless tobacco: Never Used  . Alcohol Use: 0.0 oz/week     OCCASIONALLY  . Drug Use: No  . Sexually Active: No   Other Topics Concern  . Not on file   Social History Narrative  . No narrative on file    Past  Surgical History  Procedure Date  . Breast biopsy '91, '95    INTRADUCTAL PAPILLOMA  . Breast lumpectomy   . Esophagogastroduodenoscopy   . Colonoscopy 2001    ADENOMATOUS COLONIC  POLYP    Family History  Problem Relation Age of Onset  . Adopted: Yes  . Hypertension    . Diabetes    . Heart disease Mother   . Heart disease Father   . Breast cancer Maternal Aunt   . Stroke Maternal Aunt   . Colon cancer Paternal Uncle   . Cancer Maternal Grandfather     COLON,, FAMILY HX 7 MEMBERS    No Known Allergies  Current Outpatient Prescriptions on File Prior to Visit  Medication Sig Dispense Refill  . ALPRAZolam (XANAX) 0.25 MG tablet TAKE ONE TABLET BY MOUTH THREE TIMES DAILY AS NEEDED  30 tablet  5  . capsaicin (ZOSTRIX) 0.025 % cream Apply topically 2 (two) times daily.  60 g  3  . desonide (DESOWEN) 0.05 % cream Apply topically 2 (two) times daily.  30 g  0  . furosemide (LASIX) 20 MG tablet Take 1 tablet (20 mg total) by mouth daily.  30 tablet  11  . glyBURIDE-metformin (GLUCOVANCE) 2.5-500 MG per tablet TAKE ONE TABLET BY MOUTH TWICE DAILY.  60 tablet  11  . GNP ZINC CHELATED PO Take 23 mg by mouth as needed.        . Lancets (ONETOUCH ULTRASOFT)  lancets USE AS DIRECTED  300 each  6  . olmesartan (BENICAR) 40 MG tablet Take 40 mg by mouth daily.        . ONE TOUCH ULTRA TEST test strip TEST 4 TIMES DAILY  200 each  6  . vitamin C (ASCORBIC ACID) 500 MG tablet Take 500 mg by mouth daily.        . Vitamin D, Ergocalciferol, (DRISDOL) 50000 UNITS CAPS TAKE 1 CAPSULE BY MOUTH ONCE EVERY 7 DAYS  5 capsule  3    BP 126/78  Pulse 72  Temp 98.3 F (36.8 C)  Resp 16  Ht 5' 1.5" (1.562 m)  Wt 148 lb (67.132 kg)  BMI 27.51 kg/m2  LMP 04/17/1988        Objective:   Physical Exam  Nursing note and vitals reviewed. Constitutional: She is oriented to person, place, and time. She appears well-developed and well-nourished. No distress.  HENT:  Head: Normocephalic and  atraumatic.  Right Ear: External ear normal.  Left Ear: External ear normal.  Nose: Nose normal.  Mouth/Throat: Oropharynx is clear and moist.  Eyes: Conjunctivae and EOM are normal. Pupils are equal, round, and reactive to light.  Neck: Normal range of motion. Neck supple. No JVD present. No tracheal deviation present. No thyromegaly present.  Cardiovascular: Normal rate, regular rhythm, normal heart sounds and intact distal pulses.   No murmur heard. Pulmonary/Chest: Effort normal and breath sounds normal. She has no wheezes. She exhibits no tenderness.  Abdominal: Soft. Bowel sounds are normal.  Musculoskeletal: Normal range of motion. She exhibits no edema and no tenderness.  Lymphadenopathy:    She has no cervical adenopathy.  Neurological: She is alert and oriented to person, place, and time. She has normal reflexes. No cranial nerve deficit.  Skin: Skin is warm and dry. She is not diaphoretic.  Psychiatric: She has a normal mood and affect. Her behavior is normal.          Assessment & Plan:  Discussed cataract surgery proposed for cataract removal.  Stable diabetes stable hypertension patient should do well with this surgical procedure.  The patient has persistent pain and swelling from the varicose veins in her lower extremity talked about future evaluation from interventional radiology for venous ablation if this persists.  The patient has no peripheral neuropathy the patient's diabetes has been well-controlled

## 2011-10-02 NOTE — Patient Instructions (Signed)
The patient is instructed to continue all medications as prescribed. Schedule followup with check out clerk upon leaving the clinic  

## 2011-10-03 ENCOUNTER — Other Ambulatory Visit: Payer: Self-pay | Admitting: Internal Medicine

## 2011-10-13 ENCOUNTER — Encounter (INDEPENDENT_AMBULATORY_CARE_PROVIDER_SITE_OTHER): Payer: Medicare Other

## 2011-10-13 DIAGNOSIS — R0989 Other specified symptoms and signs involving the circulatory and respiratory systems: Secondary | ICD-10-CM

## 2011-10-13 DIAGNOSIS — I6529 Occlusion and stenosis of unspecified carotid artery: Secondary | ICD-10-CM

## 2011-10-13 DIAGNOSIS — E785 Hyperlipidemia, unspecified: Secondary | ICD-10-CM

## 2011-10-13 DIAGNOSIS — I10 Essential (primary) hypertension: Secondary | ICD-10-CM

## 2011-10-13 DIAGNOSIS — E119 Type 2 diabetes mellitus without complications: Secondary | ICD-10-CM

## 2011-10-31 ENCOUNTER — Other Ambulatory Visit: Payer: Self-pay | Admitting: Internal Medicine

## 2011-12-07 ENCOUNTER — Other Ambulatory Visit (INDEPENDENT_AMBULATORY_CARE_PROVIDER_SITE_OTHER): Payer: Medicare Other

## 2011-12-07 DIAGNOSIS — Z0000 Encounter for general adult medical examination without abnormal findings: Secondary | ICD-10-CM

## 2011-12-07 DIAGNOSIS — E785 Hyperlipidemia, unspecified: Secondary | ICD-10-CM

## 2011-12-07 DIAGNOSIS — E119 Type 2 diabetes mellitus without complications: Secondary | ICD-10-CM

## 2011-12-07 DIAGNOSIS — I10 Essential (primary) hypertension: Secondary | ICD-10-CM

## 2011-12-07 LAB — C-REACTIVE PROTEIN: CRP: <0.5 mg/dL (ref 0.5–20.0)

## 2011-12-07 LAB — HEPATIC FUNCTION PANEL
ALT: 22 U/L (ref 0–35)
AST: 28 U/L (ref 0–37)
Albumin: 4.2 g/dL (ref 3.5–5.2)
Alkaline Phosphatase: 65 U/L (ref 39–117)
Bilirubin, Direct: 0 mg/dL (ref 0.0–0.3)
Total Bilirubin: 0.6 mg/dL (ref 0.3–1.2)
Total Protein: 7.6 g/dL (ref 6.0–8.3)

## 2011-12-07 LAB — CBC WITH DIFFERENTIAL/PLATELET
Basophils Absolute: 0.1 10*3/uL (ref 0.0–0.1)
Basophils Relative: 1.2 % (ref 0.0–3.0)
Eosinophils Absolute: 1.2 10*3/uL — ABNORMAL HIGH (ref 0.0–0.7)
Eosinophils Relative: 15.2 % — ABNORMAL HIGH (ref 0.0–5.0)
HCT: 40.3 % (ref 36.0–46.0)
Hemoglobin: 13.4 g/dL (ref 12.0–15.0)
Lymphocytes Relative: 25.6 % (ref 12.0–46.0)
Lymphs Abs: 2.1 10*3/uL (ref 0.7–4.0)
MCHC: 33.1 g/dL (ref 30.0–36.0)
MCV: 92.6 fl (ref 78.0–100.0)
Monocytes Absolute: 0.6 10*3/uL (ref 0.1–1.0)
Monocytes Relative: 6.9 % (ref 3.0–12.0)
Neutro Abs: 4.2 10*3/uL (ref 1.4–7.7)
Neutrophils Relative %: 51.1 % (ref 43.0–77.0)
Platelets: 289 10*3/uL (ref 150.0–400.0)
RBC: 4.35 Mil/uL (ref 3.87–5.11)
RDW: 12.9 % (ref 11.5–14.6)
WBC: 8.2 10*3/uL (ref 4.5–10.5)

## 2011-12-07 LAB — LIPID PANEL
Cholesterol: 188 mg/dL (ref 0–200)
HDL: 61.7 mg/dL (ref >39.00)
LDL Cholesterol: 100 mg/dL — ABNORMAL HIGH (ref 0–99)
Total CHOL/HDL Ratio: 3
Triglycerides: 133 mg/dL (ref 0.0–149.0)
VLDL: 26.6 mg/dL (ref 0.0–40.0)

## 2011-12-07 LAB — BASIC METABOLIC PANEL
BUN: 14 mg/dL (ref 6–23)
CO2: 31 mEq/L (ref 19–32)
Calcium: 10 mg/dL (ref 8.4–10.5)
Chloride: 101 mEq/L (ref 96–112)
Creatinine, Ser: 0.8 mg/dL (ref 0.4–1.2)
GFR: 74.14 mL/min (ref >60.00)
Glucose, Bld: 154 mg/dL — ABNORMAL HIGH (ref 70–99)
Potassium: 5.3 mEq/L — ABNORMAL HIGH (ref 3.5–5.1)
Sodium: 137 mEq/L (ref 135–145)

## 2011-12-07 LAB — MICROALBUMIN / CREATININE URINE RATIO
Creatinine,U: 145.6 mg/dL
Microalb Creat Ratio: 0.7 mg/g (ref 0.0–30.0)
Microalb, Ur: 1 mg/dL (ref 0.0–1.9)

## 2011-12-07 LAB — POCT URINALYSIS DIPSTICK
Bilirubin, UA: NEGATIVE
Blood, UA: NEGATIVE
Glucose, UA: NEGATIVE
Ketones, UA: NEGATIVE
Nitrite, UA: NEGATIVE
Protein, UA: NEGATIVE
Spec Grav, UA: 1.02
Urobilinogen, UA: 0.2
pH, UA: 7.5

## 2011-12-07 LAB — TSH: TSH: 0.63 u[IU]/mL (ref 0.35–5.50)

## 2011-12-07 LAB — HEMOGLOBIN A1C: Hgb A1c MFr Bld: 6.2 % (ref 4.6–6.5)

## 2011-12-13 ENCOUNTER — Ambulatory Visit (INDEPENDENT_AMBULATORY_CARE_PROVIDER_SITE_OTHER): Payer: Medicare Other | Admitting: Internal Medicine

## 2011-12-13 ENCOUNTER — Encounter: Payer: Self-pay | Admitting: Internal Medicine

## 2011-12-13 VITALS — BP 134/72 | HR 72 | Temp 98.2°F | Resp 16 | Ht 61.5 in | Wt 147.0 lb

## 2011-12-13 DIAGNOSIS — I10 Essential (primary) hypertension: Secondary | ICD-10-CM

## 2011-12-13 DIAGNOSIS — Z0000 Encounter for general adult medical examination without abnormal findings: Secondary | ICD-10-CM

## 2011-12-13 MED ORDER — CAPSAICIN 0.075 % EX CREA: 1.0000 "application " | TOPICAL_CREAM | Freq: Three times a day (TID) | CUTANEOUS | Status: DC

## 2011-12-13 NOTE — Progress Notes (Signed)
Subjective:    Patient ID: Patricia Moore, female    DOB: [birth year], 78 y.o.   MRN: [id number]  HPI Patient is a 78 year old female who presents for Medicare wellness examination she is [employer].  She states that she is doing well she's been following a better diet exercising more regularly and she states that her inflammation has been down she does note that when she eats bread she has bloating in which he avoids bread she feels better   Review of Systems  Constitutional: Negative for activity change, appetite change and fatigue.  HENT: Negative for ear pain, congestion, neck pain, postnasal drip and sinus pressure.   Eyes: Negative for redness and visual disturbance.  Respiratory: Negative for cough, shortness of breath and wheezing.   Gastrointestinal: Negative for abdominal pain and abdominal distention.  Genitourinary: Negative for dysuria, frequency and menstrual problem.  Musculoskeletal: Negative for myalgias, joint swelling and arthralgias.  Skin: Negative for rash and wound.  Neurological: Negative for dizziness, weakness and headaches.  Hematological: Negative for adenopathy. Does not bruise/bleed easily.  Psychiatric/Behavioral: Negative for disturbed wake/sleep cycle and decreased concentration.   Past Medical History  Diagnosis Date  . Allergy   . Hypertension   . Osteoporosis   . Diabetes mellitus   . Back pain   . Arthritis   . Hx of adenomatous colonic polyps   . Diverticulosis   . Hyperlipidemia   . H. pylori infection 2001  . Intraductal papilloma   . 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 Emily Filbert    History   Social History  . Marital Status: Married    Spouse Name: N/A    Number of Children: N/A  . Years of Education: N/A   Occupational History  . Not on file.   Social History Main Topics  . Smoking status: Never Smoker   . Smokeless tobacco: Never Used  . Alcohol  Use: 0.0 oz/week     OCCASIONALLY  . Drug Use: No  . Sexually Active: No   Other Topics Concern  . Not on file   Social History Narrative  . No narrative on file    Past Surgical History  Procedure Date  . Breast biopsy '91, '95    INTRADUCTAL PAPILLOMA  . Breast lumpectomy   . Esophagogastroduodenoscopy   . Colonoscopy 2001    ADENOMATOUS COLONIC  POLYP    Family History  Problem Relation Age of Onset  . Adopted: Yes  . Hypertension    . Diabetes    . Heart disease Mother   . Heart disease Father   . Breast cancer Maternal Aunt   . Stroke Maternal Aunt   . Colon cancer Paternal Uncle   . Cancer Maternal Grandfather     COLON,, FAMILY HX 7 MEMBERS    No Known Allergies  Current Outpatient Prescriptions on File Prior to Visit  Medication Sig Dispense Refill  . ALPRAZolam (XANAX) 0.25 MG tablet TAKE ONE TABLET BY MOUTH THREE TIMES DAILY AS NEEDED  30 tablet  5  . desonide (DESOWEN) 0.05 % cream Apply topically 2 (two) times daily.  30 g  0  . furosemide (LASIX) 20 MG tablet TAKE ONE TABLET BY MOUTH EVERY DAY  60 tablet  11  . glyBURIDE-metformin (GLUCOVANCE) 2.5-500 MG per tablet TAKE ONE TABLET BY MOUTH TWICE DAILY.  60 tablet  11  . GNP ZINC CHELATED PO Take 23 mg by mouth  as needed.        . Lancets (ONETOUCH ULTRASOFT) lancets USE AS DIRECTED  300 each  6  . olmesartan (BENICAR) 40 MG tablet Take 40 mg by mouth daily.        . ONE TOUCH ULTRA TEST test strip TEST 4 TIMES DAILY  200 each  6  . vitamin C (ASCORBIC ACID) 500 MG tablet Take 500 mg by mouth daily.        . Vitamin D, Ergocalciferol, (DRISDOL) 50000 UNITS CAPS TAKE 1 CAPSULE BY MOUTH ONCE EVERY 7 DAYS  5 capsule  3    BP 134/72  Pulse 72  Temp 98.2 F (36.8 C)  Resp 16  Ht 5' 1.5" (1.562 m)  Wt 147 lb (66.679 kg)  BMI 27.33 kg/m2  LMP 04/17/1988       Objective:   Physical Exam  Constitutional: She is oriented to person, place, and time. She appears well-developed and well-nourished. No  distress.  HENT:  Head: Normocephalic and atraumatic.  Right Ear: External ear normal.  Left Ear: External ear normal.  Nose: Nose normal.  Mouth/Throat: Oropharynx is clear and moist.  Eyes: Conjunctivae and EOM are normal. Pupils are equal, round, and reactive to light.  Neck: Normal range of motion. Neck supple. No JVD present. No tracheal deviation present. No thyromegaly present.  Cardiovascular: Normal rate, regular rhythm, normal heart sounds and intact distal pulses.   No murmur heard. Pulmonary/Chest: Effort normal and breath sounds normal. She has no wheezes. She exhibits no tenderness.  Abdominal: Soft. Bowel sounds are normal.  Musculoskeletal: Normal range of motion. She exhibits no edema and no tenderness.  Lymphadenopathy:    She has no cervical adenopathy.  Neurological: She is alert and oriented to person, place, and time. She has normal reflexes. No cranial nerve deficit.  Skin: Skin is warm and dry. She is not diaphoretic.  Psychiatric: She has a normal mood and affect. Her behavior is normal.          Assessment & Plan:   This is a routine physical examination for this healthy  Female. Reviewed all health maintenance protocols including mammography colonoscopy bone density and reviewed appropriate screening labs. Her immunization history was reviewed as well as her current medications and allergies refills of her chronic medications were given and the plan for yearly health maintenance was discussed all orders and referrals were made as appropriate.  Reviewed diet

## 2012-01-03 ENCOUNTER — Other Ambulatory Visit: Payer: Self-pay | Admitting: *Deleted

## 2012-01-03 MED ORDER — VITAMIN D (ERGOCALCIFEROL) 1.25 MG (50000 UNIT) PO CAPS: 50000.0000 [IU] | ORAL_CAPSULE | ORAL | Status: DC

## 2012-03-04 ENCOUNTER — Encounter: Payer: Self-pay | Admitting: Internal Medicine

## 2012-03-04 ENCOUNTER — Ambulatory Visit (INDEPENDENT_AMBULATORY_CARE_PROVIDER_SITE_OTHER): Payer: Medicare Other | Admitting: Internal Medicine

## 2012-03-04 VITALS — BP 140/76 | HR 72 | Temp 98.2°F | Resp 16 | Ht 61.5 in | Wt 146.0 lb

## 2012-03-04 DIAGNOSIS — I10 Essential (primary) hypertension: Secondary | ICD-10-CM

## 2012-03-04 DIAGNOSIS — E785 Hyperlipidemia, unspecified: Secondary | ICD-10-CM

## 2012-03-04 DIAGNOSIS — IMO0001 Reserved for inherently not codable concepts without codable children: Secondary | ICD-10-CM

## 2012-03-04 DIAGNOSIS — M1710 Unilateral primary osteoarthritis, unspecified knee: Secondary | ICD-10-CM

## 2012-03-04 DIAGNOSIS — I83893 Varicose veins of bilateral lower extremities with other complications: Secondary | ICD-10-CM

## 2012-03-04 DIAGNOSIS — Z23 Encounter for immunization: Secondary | ICD-10-CM

## 2012-03-04 LAB — BASIC METABOLIC PANEL
BUN: 20 mg/dL (ref 6–23)
CO2: 30 mEq/L (ref 19–32)
Calcium: 9.8 mg/dL (ref 8.4–10.5)
Chloride: 100 mEq/L (ref 96–112)
Creatinine, Ser: 0.9 mg/dL (ref 0.4–1.2)
GFR: 67.26 mL/min (ref >60.00)
Glucose, Bld: 73 mg/dL (ref 70–99)
Potassium: 4.2 mEq/L (ref 3.5–5.1)
Sodium: 137 mEq/L (ref 135–145)

## 2012-03-04 LAB — HEMOGLOBIN A1C: Hgb A1c MFr Bld: 6.6 % — ABNORMAL HIGH (ref 4.6–6.5)

## 2012-03-04 NOTE — Progress Notes (Signed)
Subjective:    Patient ID: Patricia Moore, female    DOB: 12-Jun-1933, 78 y.o.   MRN: [id number]  HPI Neck pain and soreness on the left, took aspirin and the pain stopped  Knee pain  Blood pressure good    Review of Systems  Constitutional: Negative for activity change, appetite change and fatigue.  HENT: Negative for ear pain, congestion, neck pain, postnasal drip and sinus pressure.   Eyes: Negative for redness and visual disturbance.  Respiratory: Negative for cough, shortness of breath and wheezing.   Gastrointestinal: Negative for abdominal pain and abdominal distention.  Genitourinary: Negative for dysuria, frequency and menstrual problem.  Musculoskeletal: Negative for myalgias, joint swelling and arthralgias.  Skin: Negative for rash and wound.  Neurological: Negative for dizziness, weakness and headaches.  Hematological: Negative for adenopathy. Does not bruise/bleed easily.  Psychiatric/Behavioral: Negative for sleep disturbance and decreased concentration.       Objective:   Physical Exam  Nursing note and vitals reviewed. Constitutional: She is oriented to person, place, and time. She appears well-developed and well-nourished. No distress.  HENT:  Head: Normocephalic and atraumatic.  Right Ear: External ear normal.  Left Ear: External ear normal.  Nose: Nose normal.  Mouth/Throat: Oropharynx is clear and moist.  Eyes: Conjunctivae normal and EOM are normal. Pupils are equal, round, and reactive to light.  Neck: Normal range of motion. Neck supple. No JVD present. No tracheal deviation present. No thyromegaly present.  Cardiovascular: Normal rate, regular rhythm, normal heart sounds and intact distal pulses.   No murmur heard. Pulmonary/Chest: Effort normal and breath sounds normal. She has no wheezes. She exhibits no tenderness.  Abdominal: Soft. Bowel sounds are normal.  Musculoskeletal: Normal range of motion. She exhibits no edema and no tenderness.    Lymphadenopathy:    She has no cervical adenopathy.  Neurological: She is alert and oriented to person, place, and time. She has normal reflexes. No cranial nerve deficit.  Skin: Skin is warm and dry. She is not diaphoretic.  Psychiatric: She has a normal mood and affect. Her behavior is normal.          Assessment & Plan:  HTN stable Weight stable Discussed diet Breathing well

## 2012-03-05 ENCOUNTER — Other Ambulatory Visit: Payer: Self-pay | Admitting: Internal Medicine

## 2012-05-07 ENCOUNTER — Other Ambulatory Visit: Payer: Self-pay | Admitting: Internal Medicine

## 2012-05-15 ENCOUNTER — Encounter: Payer: Medicare Other | Admitting: Gynecology

## 2012-05-29 ENCOUNTER — Encounter: Payer: Self-pay | Admitting: Gynecology

## 2012-06-05 ENCOUNTER — Encounter: Payer: Self-pay | Admitting: Gynecology

## 2012-06-05 ENCOUNTER — Ambulatory Visit (INDEPENDENT_AMBULATORY_CARE_PROVIDER_SITE_OTHER): Payer: Medicare Other | Admitting: Gynecology

## 2012-06-05 VITALS — BP 146/70 | Ht 60.5 in | Wt 150.0 lb

## 2012-06-05 DIAGNOSIS — N950 Postmenopausal bleeding: Secondary | ICD-10-CM

## 2012-06-05 DIAGNOSIS — N952 Postmenopausal atrophic vaginitis: Secondary | ICD-10-CM

## 2012-06-05 DIAGNOSIS — M62830 Muscle spasm of back: Secondary | ICD-10-CM | POA: Insufficient documentation

## 2012-06-05 DIAGNOSIS — L680 Hirsutism: Secondary | ICD-10-CM

## 2012-06-05 DIAGNOSIS — N930 Postcoital and contact bleeding: Secondary | ICD-10-CM

## 2012-06-05 DIAGNOSIS — D259 Leiomyoma of uterus, unspecified: Secondary | ICD-10-CM

## 2012-06-05 DIAGNOSIS — M8580 Other specified disorders of bone density and structure, unspecified site: Secondary | ICD-10-CM

## 2012-06-05 DIAGNOSIS — M5380 Other specified dorsopathies, site unspecified: Secondary | ICD-10-CM

## 2012-06-05 DIAGNOSIS — M899 Disorder of bone, unspecified: Secondary | ICD-10-CM

## 2012-06-05 MED ORDER — CYCLOBENZAPRINE HCL 5 MG PO TABS: ORAL_TABLET | ORAL | Status: DC

## 2012-06-05 MED ORDER — NONFORMULARY OR COMPOUNDED ITEM: Status: DC

## 2012-06-05 MED ORDER — PREDNISONE (PAK) 10 MG PO TABS: 10.0000 mg | ORAL_TABLET | Freq: Every day | ORAL | Status: DC

## 2012-06-05 NOTE — Progress Notes (Addendum)
Patricia Moore 1933/09/28 [id number]   History:    79 y.o.  Presented to the office today with several complaints. She mentioned to me that recently she had intercourse with her spouse and had some postcoital bleeding. She found that she was dry and irritated and was somewhat uncomfortable despite using lubricant. Patient also was complaining of low back spasm from recent activity that she was doing at home. She denies any dysuria or frequency fever chills nausea or vomiting. Review of her record indicated she has history of vitamin D deficiency and has had osteopenia and her last bone density study was in January 2013 whereby her lowest T score was in the AP spine with a value of -1.3. Bone density was stable in comparison to previous study 2 years prior. Patient is currently on a drug-free holiday (patient had been on Actonel for several years in the past). Patient also was concerned about facial hair that's becoming bothersome for her. Also patient has strong family history of colon cancer and patient had benign colon polyps removed in 2010. Patient's last mammogram done in 3D in February this year was normal.patient has 3 aunts with history of breast cancer. Patient with no prior history of abnormal Pap smears.patient with past history of right Auricular squamous cell carcinoma and is being followed by her dermatologist once a year. Patient's immunizations are up-to-date.  Past medical history,surgical history, family history and social history were all reviewed and documented in the EPIC chart.  Gynecologic History Patient's last menstrual period was 04/17/1988. Contraception: post menopausal status Last Pap: 2012. Results were: normal Last mammogram: 2014. Results were: normal  Obstetric History OB History   Grav Para Term Preterm Abortions TAB SAB Ect Mult Living   2 1   1  1   1      # Outc Date GA Lbr Len/2nd Wgt Sex Del Anes PTL Lv   1 PAR            2 SAB                ROS: A ROS was  performed and pertinent positives and negatives are included in the history.  GENERAL: No fevers or chills. HEENT: No change in vision, no earache, sore throat or sinus congestion. NECK: No pain or stiffness. CARDIOVASCULAR: No chest pain or pressure. No palpitations. PULMONARY: No shortness of breath, cough or wheeze. GASTROINTESTINAL: No abdominal pain, nausea, vomiting or diarrhea, melena or bright red blood per rectum. GENITOURINARY: No urinary frequency, urgency, hesitancy or dysuria. Musculoskeletal:low back spasm DERMATOLOGIC:fascial hairs ENDOCRINE: No polyuria, polydipsia, no heat or cold intolerance. No recent change in weight. HEMATOLOGICAL: No anemia or easy bruising or bleeding. NEUROLOGIC: No headache, seizures, numbness, tingling or weakness. PSYCHIATRIC: No depression, no loss of interest in normal activity or change in sleep pattern.     Exam: chaperone present  BP 146/70  Ht 5' 0.5" (1.537 m)  Wt 150 lb (68.04 kg)  BMI 28.8 kg/m2  LMP 04/17/1988  Body mass index is 28.8 kg/(m^2).  General appearance : Well developed well nourished female. No acute distress, facial hairs were noted as in the mandibular and chin area HEENT: Neck supple, trachea midline, no carotid bruits, no thyroidmegaly Lungs: Clear to auscultation, no rhonchi or wheezes, or rib retractions  Heart: Regular rate and rhythm, no murmurs or gallops Breast:Examined in sitting and supine position were symmetrical in appearance, no palpable masses or tenderness,  no skin retraction, no nipple inversion, no nipple discharge, no  skin discoloration, no axillary or supraclavicular lymphadenopathy Abdomen: no palpable masses or tenderness, no rebound or guarding Back: Lumbosacral region para spinous spasm was noted and tender on exam. Extremities: no edema or skin discoloration or tenderness  Pelvic:  Bartholin, Urethra, Skene Glands: Within normal limits             Vagina: No gross lesions or discharge,vaginal  atrophy  Cervix: No gross lesions or discharge  Uterus  Anteverted irregular shaped,non-tender and mobile  Adnexa  Without masses or tenderness  Anus and perineum  normal   Rectovaginal  normal sphincter tone without palpated masses or tenderness             Hemoccult cards provided for the patient is made to the office for testing     Assessment/Plan:  79 y.o. female with complaint of low back pain. It appears she has a muscle spasm in the lumbosacral region. She is going to be placed on a prednisone (Sterapred uni-pack) 10 mg to take over 6 day period. She will also be prescribed Flexeril 5 mg as a muscle relaxant to take before bedtime. As to her concern of hirsutism will test her to rule out late onset adrenal hyperplasia by testing 17 hydroxyprogesterone, total testosterone, and DHEAS. Because of her history of fibroid uterus I will continue to follow with ultrasound once a year. She is not due for a bone density study until next year. For her vaginal atrophy she is going to be placed on transvaginal estrogen twice a week (estradiol 0.02%). The risks benefits and pros and cons of hormone replacement therapy were discussed with the patient in Spanish and literature information was provided as well. She was reminded to continue to do her monthly self breast examination. We discussed that she should not be taking more than one  vitamin D 50,000 units q. Monthly along with one Caltrate plus daily.no Pap smear was done today. The new guidelines discussed.patient scheduled for medical exam with Dr. Lovell Sheehan her internist in the next few days.Patient was reminded to submit to the office the Hemoccult cards for testing.    Ok Edwards MD, 2:42 PM 06/05/2012

## 2012-06-05 NOTE — Patient Instructions (Addendum)
Terapia de reemplazo hormonal (Hormone Replacement Therapy) En la menopausia, su cuerpo comienza a producir menos estrgeno y progesterona. Esto provoca que el cuerpo deje de tener perodos menstruales. Esto se debe a que el estrgeno y la progesterona controlan sus perodos y su ciclo menstrual. Una falta de estrgeno puede causar sntomas tales como:  Golpes calor.  Sequedad vaginal  Piel seca.  Prdida del deseo sexual.  Riesgo de prdida de hueso (osteoporosis). Cuando esto ocurre, puede elegir realizar una terapia hormonal para volver a obtener el estrgeno perdido durante la menopausia. Cuando slo se introduce esta hormona, el procedimiento se conoce normalmente como TRE (terapia de reemplazo de estrgeno). Cuando la hormona progestina se combina con el estrgeno, el procedimiento se conoce normalmente como TH (terapia hormonal). Esto es lo que previamente se conoca como terapia de reemplazo hormonal (TRH). El profesional que le asiste le ayudar a tomar una decisin acerca de lo que resulte lo mejor para usted. La decisin de realizar una TRH cambia a menudo debido a que se realizan nuevos exmenes. Muchos estudios no ponen de acuerdo con respecto a los beneficios de realizar una terapia de reemplazo hormonal.  BENEFICIOS PROBABLES DE LA TRH QUE INCLUYEN PROTECCIN CONTRA:  Golpes de calor - Un golpe de calor es la sensacin repentina de calor sobre la cara y el cuerpo. La piel enrojece, como al sonrojarse. Estn asociados con la transpiracin y los trastornos del sueo. Las mujeres que atraviesan la menopausia pueden tener golpes de calor unas pocas veces en el mes o varios al da; esto depende de la mujer.  Osteoporosis (prdida de hueso)  El estrgeno ayuda a protegerse contra la prdida de hueso. Luego de la menopausia, los huesos de una mujer pierden calcio y se vuelven frgiles y quebradizos. Como resultado, es ms probable que el hueso se quiebre. Los que resultan afectados con  mayor frecuencia son los de la cadera, la mueca y la columna vertebral. La terapia hormonal puede ayudar a retardar la prdida de hueso luego de la menopausia. Realizar ejercicios con peso y tomar calcio con vitamina D tambin puede ayudar a prevenir la prdida de hueso. Existen medicamentos que puede prescribir el profesional que la asiste para ayudar a prevenir la osteoporosis.  Sequedad vaginal  La prdida de estrgeno produce cambios en la vagina. El recubrimiento de la misma puede volverse fino y reseco. Estos cambios pueden causar dolor y sangrado durante las relaciones sexuales. La sequedad tambin puede producir una infeccin. Puede ocasionarle ardor y picazn.  Las infecciones en las vas urinarias son ms comunes luego de la menopausia debido a la falta de estrgeno.  Otros beneficios posibles del estrgeno incluyen un cambio positivo en el humor y en la memoria de corto plazo en las mujeres. EFECTOS SECUNDARIOS Y RIESGOS  Utilizar estrgeno slo sin progesterona causa que el recubrimiento del tero crezca. Esto aumenta el riesgo de cncer endometrial. El profesional que la asiste deber darle otra hormona llamada progestina, si usted tiene tero.  Las mujeres que realizan una TH combinada (estrgeno y progestina) parecen tener un mayor riesgo de sufrir cncer de mama. El riesgo parece ser pequeo, pero aumenta a lo largo del tiempo que se realice la TH.  La terapia combinada tambin hace que el tejido mamario sea levemente ms denso, lo que hace que sea ms difcil leer mamografas (radiografas de mama).  Combinada, la terapia de estrgeno y progesterona puede realizarse todos los das, en cuyo caso podrn aparecer manchas de sangre. La TH puede realizarse   de manera cclica, en cuyo caso tendr perodos menstruales.  La TH puede aumentar el riesgo de [place], ataque cardaco, cncer de mama y formacin de cogulos en la pierna. TRATAMIENTO  Si decide realizar Neomia Dear TH y tiene tero,  normalmente se prescribe el uso de estrgeno y progestina.  El profesional que la asiste le ayudar a decidir la mejor forma de [employer].  Lo mejor es Occupational hygienist dosis posible que pueda ayudarla con sus sntomas y tomarlos durante la menor cantidad de tiempo posible.  La terapia hormonal puede ayudar a Best boy de los problemas (sntomas) que afectan a las mujeres durante la menopausia. Antes de tomar una decisin con respecto a la TH, converse con el profesional que la asiste acerca de qu es lo mejor para usted. Mantngase bien informada y sintase cmoda con sus decisiones. INSTRUCCIONES PARA EL CUIDADO DOMICILIARIO:  Siga las indicaciones del profesional con respecto a cmo Recruitment consultant.  Hgase controles de [place] regular, e incluya [place] y [place]. SOLICITE ATENCIN MDICA DE INMEDIATO SI PRESENTA:  Hemorragia vaginal anormal.  Dolor o inflamacin en las piernas, falta de aliento o Journalist, newspaper.  Mareos o dolores de [place].  Protuberancias o cambios en sus mamas o axilas.  Pronunciacin inarticulada.  Debilidad o adormecimiento en los brazos o las piernas.  Dolor, ardor o sangrado al [employer].  Dolor abdominal. Document Released: 09/20/2007 Document Revised: 06/26/2011 [facility] Patient Information 2013 [place], [place].

## 2012-06-06 LAB — TESTOSTERONE: Testosterone: 60 ng/dL (ref 10–70)

## 2012-06-07 ENCOUNTER — Ambulatory Visit (INDEPENDENT_AMBULATORY_CARE_PROVIDER_SITE_OTHER): Payer: Medicare Other | Admitting: Internal Medicine

## 2012-06-07 ENCOUNTER — Other Ambulatory Visit: Payer: Self-pay | Admitting: *Deleted

## 2012-06-07 ENCOUNTER — Encounter: Payer: Self-pay | Admitting: Internal Medicine

## 2012-06-07 VITALS — BP 136/80 | HR 72 | Temp 98.2°F | Resp 16 | Ht <= 58 in | Wt 149.0 lb

## 2012-06-07 DIAGNOSIS — Z792 Long term (current) use of antibiotics: Secondary | ICD-10-CM

## 2012-06-07 DIAGNOSIS — E559 Vitamin D deficiency, unspecified: Secondary | ICD-10-CM

## 2012-06-07 DIAGNOSIS — IMO0001 Reserved for inherently not codable concepts without codable children: Secondary | ICD-10-CM

## 2012-06-07 LAB — BASIC METABOLIC PANEL
BUN: 15 mg/dL (ref 6–23)
CO2: 30 mEq/L (ref 19–32)
Calcium: 10.2 mg/dL (ref 8.4–10.5)
Chloride: 103 mEq/L (ref 96–112)
Creatinine, Ser: 1 mg/dL (ref 0.4–1.2)
GFR: 57.9 mL/min — ABNORMAL LOW (ref >60.00)
Glucose, Bld: 163 mg/dL — ABNORMAL HIGH (ref 70–99)
Potassium: 4.9 mEq/L (ref 3.5–5.1)
Sodium: 139 mEq/L (ref 135–145)

## 2012-06-07 LAB — HEMOGLOBIN A1C: Hgb A1c MFr Bld: 6.9 % — ABNORMAL HIGH (ref 4.6–6.5)

## 2012-06-07 MED ORDER — DOXYCYCLINE HYCLATE 100 MG PO TABS: 100.0000 mg | ORAL_TABLET | Freq: Two times a day (BID) | ORAL | Status: DC

## 2012-06-07 MED ORDER — GLYBURIDE 2.5 MG PO TABS: 2.5000 mg | ORAL_TABLET | Freq: Two times a day (BID) | ORAL | Status: DC

## 2012-06-07 MED ORDER — GLYBURIDE-METFORMIN 2.5-500 MG PO TABS: ORAL_TABLET | ORAL | Status: DC

## 2012-06-07 MED ORDER — METFORMIN HCL 500 MG PO TABS: 500.0000 mg | ORAL_TABLET | Freq: Two times a day (BID) | ORAL | Status: DC

## 2012-06-07 MED ORDER — ALPRAZOLAM 0.25 MG PO TABS: ORAL_TABLET | ORAL | Status: DC

## 2012-06-07 NOTE — Progress Notes (Signed)
Subjective:    Patient ID: Patricia Moore, female    DOB: 15-Aug-1933, 79 y.o.   MRN: [id number]  HPI  Patient treated by gynecologist for upper respiratory tract infection with a course of Depo-Medrol she states that she has congestion and a cough but denies any fever or chills.  She was also given a prescription for Flexeril at bedtime for back pain this has been exacerbated from her cough she states that she has episodic pain and numbness down her left leg.    Review of Systems  Constitutional: Negative for activity change, appetite change and fatigue.  HENT: Positive for nosebleeds, congestion, rhinorrhea and postnasal drip. Negative for ear pain, neck pain and sinus pressure.   Eyes: Negative for redness and visual disturbance.  Respiratory: Positive for cough. Negative for shortness of breath and wheezing.   Gastrointestinal: Negative for abdominal pain and abdominal distention.  Genitourinary: Negative for dysuria, frequency and menstrual problem.  Musculoskeletal: Negative for myalgias, joint swelling and arthralgias.  Skin: Negative for rash and wound.  Neurological: Negative for dizziness, weakness and headaches.  Hematological: Negative for adenopathy. Does not bruise/bleed easily.  Psychiatric/Behavioral: Negative for sleep disturbance and decreased concentration.   Past Medical History  Diagnosis Date  . Allergy   . Hypertension   . Osteoporosis   . Diabetes mellitus   . Back pain   . Arthritis   . Hx of adenomatous colonic polyps   . Diverticulosis   . Hyperlipidemia   . H. pylori infection 2001  . Intraductal papilloma(M8503/0)   . 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 Emily Filbert    History   Social History  . Marital Status: Married    Spouse Name: N/A    Number of Children: N/A  . Years of Education: N/A   Occupational History  . Not on file.   Social History Main Topics  . Smoking status:  Never Smoker   . Smokeless tobacco: Never Used  . Alcohol Use: Not on file     Comment: OCCASIONALLY  . Drug Use: No  . Sexually Active: No   Other Topics Concern  . Not on file   Social History Narrative  . No narrative on file    Past Surgical History  Procedure Laterality Date  . Breast biopsy  '91, '95    INTRADUCTAL PAPILLOMA  . Breast lumpectomy    . Esophagogastroduodenoscopy    . Colonoscopy  2001    ADENOMATOUS COLONIC  POLYP    Family History  Problem Relation Age of Onset  . Adopted: Yes  . Hypertension    . Diabetes    . Heart disease Mother   . Heart disease Father   . Breast cancer Maternal Aunt   . Stroke Maternal Aunt   . Colon cancer Paternal Uncle   . Cancer Maternal Grandfather     COLON,, FAMILY HX 7 MEMBERS  . Breast cancer Maternal Aunt     No Known Allergies  Current Outpatient Prescriptions on File Prior to Visit  Medication Sig Dispense Refill  . capsicum (ZOSTRIX) 0.075 % topical cream Apply 1 application topically 3 (three) times daily.  28.3 g  3  . cyclobenzaprine (FLEXERIL) 5 MG tablet Take one at bedtime  20 tablet  0  . desonide (DESOWEN) 0.05 % cream Apply topically 2 (two) times daily.  30 g  0  . furosemide (LASIX) 20 MG tablet TAKE ONE TABLET BY  MOUTH EVERY DAY  60 tablet  11  . GNP ZINC CHELATED PO Take 23 mg by mouth as needed.        . Lancets (ONETOUCH ULTRASOFT) lancets USE AS DIRECTED  300 each  6  . NONFORMULARY OR COMPOUNDED ITEM Estradiol .02% 1 ML Prefilled Applicator Sig: apply vaginally twice a week #90 Day Supply with 4 refills  1 each  4  . olmesartan (BENICAR) 40 MG tablet Take 40 mg by mouth daily.        . ONE TOUCH ULTRA TEST test strip TEST 4 TIMES DAILY  200 each  6  . predniSONE (STERAPRED UNI-PAK) 10 MG tablet Take 1 tablet (10 mg total) by mouth daily. 10 Mg Steroid Pak for 6 days #21  21 tablet  0  . vitamin C (ASCORBIC ACID) 500 MG tablet Take 500 mg by mouth daily.        . Vitamin D,  Ergocalciferol, (DRISDOL) 50000 UNITS CAPS Take 1 capsule (50,000 Units total) by mouth every 7 (seven) days.  5 capsule  3   No current facility-administered medications on file prior to visit.    BP 136/80  Pulse 72  Temp(Src) 98.2 F (36.8 C)  Resp 16  Ht 4' (1.219 m)  Wt 149 lb (67.586 kg)  BMI 45.48 kg/m2  LMP 04/17/1988       Objective:   Physical Exam  Nursing note and vitals reviewed. Constitutional: She is oriented to person, place, and time. She appears well-developed and well-nourished. No distress.  HENT:  Head: Normocephalic and atraumatic.  Right Ear: External ear normal.  Left Ear: External ear normal.  Nose: Nose normal.  Mouth/Throat: Oropharynx is clear and moist.  Eyes: Conjunctivae and EOM are normal. Pupils are equal, round, and reactive to light.  Neck: Normal range of motion. Neck supple. No JVD present. No tracheal deviation present. No thyromegaly present.  Cardiovascular: Normal rate, regular rhythm, normal heart sounds and intact distal pulses.   No murmur heard. Pulmonary/Chest: Effort normal and breath sounds normal. She has no wheezes. She exhibits no tenderness.  Abdominal: Soft. Bowel sounds are normal.  Musculoskeletal: Normal range of motion. She exhibits no edema and no tenderness.  Lymphadenopathy:    She has no cervical adenopathy.  Neurological: She is alert and oriented to person, place, and time. She has normal reflexes. No cranial nerve deficit.  Skin: Skin is warm and dry. She is not diaphoretic.  Psychiatric: She has a normal mood and affect. Her behavior is normal.          Assessment & Plan:  For cough medicine for the postviral cough we will use samples of attuss On physical examination shows no signs of acute pneumonia.  Her blood pressure stable her current medications we'll monitor her diabetes with a hemoglobin A1c and basic metabolic panel.

## 2012-06-08 LAB — VITAMIN D 25 HYDROXY (VIT D DEFICIENCY, FRACTURES): Vit D, 25-Hydroxy: 58 ng/mL (ref 30–89)

## 2012-06-09 LAB — DHEA: DHEA: 297 ng/dL (ref 102–1185)

## 2012-06-09 LAB — 17-HYDROXYPROGESTERONE: 17-OH-Progesterone, LC/MS/MS: 17 ng/dL

## 2012-06-11 ENCOUNTER — Encounter: Payer: Self-pay | Admitting: Gynecology

## 2012-06-14 ENCOUNTER — Ambulatory Visit (INDEPENDENT_AMBULATORY_CARE_PROVIDER_SITE_OTHER): Payer: Medicare Other

## 2012-06-14 ENCOUNTER — Ambulatory Visit (INDEPENDENT_AMBULATORY_CARE_PROVIDER_SITE_OTHER): Payer: Medicare Other | Admitting: Gynecology

## 2012-06-14 ENCOUNTER — Encounter: Payer: Self-pay | Admitting: Gynecology

## 2012-06-14 VITALS — BP 130/78

## 2012-06-14 DIAGNOSIS — D259 Leiomyoma of uterus, unspecified: Secondary | ICD-10-CM

## 2012-06-14 DIAGNOSIS — N952 Postmenopausal atrophic vaginitis: Secondary | ICD-10-CM

## 2012-06-14 DIAGNOSIS — L680 Hirsutism: Secondary | ICD-10-CM

## 2012-06-14 DIAGNOSIS — M549 Dorsalgia, unspecified: Secondary | ICD-10-CM

## 2012-06-14 MED ORDER — CYCLOBENZAPRINE HCL 5 MG PO TABS: ORAL_TABLET | ORAL | Status: DC

## 2012-06-14 NOTE — Progress Notes (Signed)
Patient presented to the office today for followup. See previous note from recent office visit. Patient had some isolated vaginal bleeding after intercourse. It was attributed to based on recent exam a result of her vaginal atrophy. She was started on estrogen vaginal cream which she has not purchase yet. She had been complaining also of hirsutism and we had done a 17 hydroxyprogesterone, DHEAS, and total testosterone which were all normal. We asked her to come to the office today for an ultrasound to take a look at her ovaries measure the dye was in the source.  Ultrasound: Uterus measures 6.9 x 6.0 x 5.0 cm with endometrial stripe of 3.7 mm. She had 4 small fibroids one calcified largest one measuring 30 x 35 mm. Ovaries otherwise appeared to be normal.  Patient has continued to have low back discomfort. We had placed her on a prednisone pack along with Flexeril as a muscle relaxant and it has slightly improved her symptoms. I'm going to recommend that she followup with the orthopedic surgeon for x-rays of her back to rule without possibility of a herniated disc.I have asked her to make an appointment with her primary physician Dr. Lovell Sheehan to see if we can switch her diuretic from the Lasix  to Aldactone to help with her hirsutism.

## 2012-07-10 ENCOUNTER — Other Ambulatory Visit: Payer: Self-pay | Admitting: *Deleted

## 2012-07-10 MED ORDER — VITAMIN D (ERGOCALCIFEROL) 1.25 MG (50000 UNIT) PO CAPS: 50000.0000 [IU] | ORAL_CAPSULE | ORAL | Status: DC

## 2012-07-29 ENCOUNTER — Encounter: Payer: Self-pay | Admitting: Family Medicine

## 2012-07-29 ENCOUNTER — Ambulatory Visit (INDEPENDENT_AMBULATORY_CARE_PROVIDER_SITE_OTHER): Payer: Medicare Other | Admitting: Family Medicine

## 2012-07-29 VITALS — BP 174/80 | HR 70 | Temp 98.2°F | Wt 154.0 lb

## 2012-07-29 DIAGNOSIS — M545 Low back pain, unspecified: Secondary | ICD-10-CM

## 2012-07-29 NOTE — Progress Notes (Signed)
Chief Complaint  Patient presents with  . Back Pain    HPI:  Acute visit for Back pain: -hx chronic back pain going back to at least 2012 per review of char, spondylosis per prior PCP notes -back pain treated with prednisone and muscle relaxer in the past by PCP and gynecologist (most recently 2 months ago) -per chart referred to ortho for this by her gynecologist -reports she never saw an orthopedic doctor because felt better -has flares of this on and off -started up again a few days ago - she doesn't know what triggered this - thinks got out of bed wrong -pain is 3-4/10 pain, located in R lower back, worse with certain activities - bending down, better with standing and heat -denies: fevers, weakness, numbness, radiation, bowel or bladder incontinence  ROS: See pertinent positives and negatives per HPI.  Past Medical History  Diagnosis Date  . Allergy   . Hypertension   . Osteoporosis   . Diabetes mellitus   . Back pain   . Arthritis   . Hx of adenomatous colonic polyps   . Diverticulosis   . Hyperlipidemia   . H. pylori infection 2001  . Intraductal papilloma(M8503/0)   . 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 Emily Filbert    Family History  Problem Relation Age of Onset  . Adopted: Yes  . Hypertension    . Diabetes    . Heart disease Mother   . Heart disease Father   . Breast cancer Maternal Aunt   . Stroke Maternal Aunt   . Colon cancer Paternal Uncle   . Cancer Maternal Grandfather     COLON,, FAMILY HX 7 MEMBERS  . Breast cancer Maternal Aunt     History   Social History  . Marital Status: Married    Spouse Name: N/A    Number of Children: N/A  . Years of Education: N/A   Social History Main Topics  . Smoking status: Never Smoker   . Smokeless tobacco: Never Used  . Alcohol Use: None     Comment: OCCASIONALLY  . Drug Use: No  . Sexually Active: No   Other Topics Concern  . None   Social  History Narrative  . None    Current outpatient prescriptions:ALPRAZolam (XANAX) 0.25 MG tablet, TAKE ONE TABLET BY MOUTH THREE TIMES DAILY AS NEEDED, Disp: 30 tablet, Rfl: 5;  capsicum (ZOSTRIX) 0.075 % topical cream, Apply 1 application topically 3 (three) times daily., Disp: 28.3 g, Rfl: 3;  cyclobenzaprine (FLEXERIL) 5 MG tablet, Take 5 mg by mouth 3 (three) times daily as needed for muscle spasms., Disp: , Rfl:  cyclobenzaprine (FLEXERIL) 5 MG tablet, Take one at bedtime, Disp: 20 tablet, Rfl: 0;  doxycycline (VIBRA-TABS) 100 MG tablet, Take 1 tablet (100 mg total) by mouth 2 (two) times daily., Disp: 42 tablet, Rfl: 0;  furosemide (LASIX) 20 MG tablet, TAKE ONE TABLET BY MOUTH EVERY DAY, Disp: 60 tablet, Rfl: 11;  glyBURIDE (DIABETA) 2.5 MG tablet, Take 1 tablet (2.5 mg total) by mouth 2 (two) times daily with a meal., Disp: 180 tablet, Rfl: 3 GNP ZINC CHELATED PO, Take 23 mg by mouth as needed.  , Disp: , Rfl: ;  Lancets (ONETOUCH ULTRASOFT) lancets, USE AS DIRECTED, Disp: 300 each, Rfl: 6;  metFORMIN (GLUCOPHAGE) 500 MG tablet, Take 1 tablet (500 mg total) by mouth 2 (two) times daily with a meal., Disp: 180 tablet, Rfl: 3;  NONFORMULARY OR COMPOUNDED ITEM, Estradiol .02% 1 ML Prefilled Applicator Sig: apply vaginally twice a week #90 Day Supply with 4 refills, Disp: 1 each, Rfl: 4 olmesartan (BENICAR) 40 MG tablet, Take 40 mg by mouth daily.  , Disp: , Rfl: ;  ONE TOUCH ULTRA TEST test strip, TEST 4 TIMES DAILY, Disp: 200 each, Rfl: 6;  predniSONE (STERAPRED UNI-PAK) 10 MG tablet, Take 1 tablet (10 mg total) by mouth daily. 10 Mg Steroid Pak for 6 days #21, Disp: 21 tablet, Rfl: 0;  vitamin C (ASCORBIC ACID) 500 MG tablet, Take 500 mg by mouth daily.  , Disp: , Rfl:  Vitamin D, Ergocalciferol, (DRISDOL) 50000 UNITS CAPS, Take 1 capsule (50,000 Units total) by mouth every 7 (seven) days., Disp: 5 capsule, Rfl: 6  EXAM:  Filed Vitals:   07/29/12 1513  BP: 174/80  Pulse: 70  Temp: 98.2 F (36.8  C)    Body mass index is 47.01 kg/(m^2).  GENERAL: vitals reviewed and listed above, alert, oriented, appears well hydrated and in no acute distress  HEENT: atraumatic, conjunttiva clear, no obvious abnormalities on inspection of external nose and ears  NECK: no obvious masses on inspection  LUNGS: clear to auscultation bilaterally, no wheezes, rales or rhonchi, good air movement  CV: HRRR, no peripheral edema  MS: moves all extremities without noticeable abnormality Normal Gait Normal inspection of back, no obvious scoliosis or leg length descrepancy No bony TTP Soft tissue TTP at: PSIS on L -/+ tests: neg trendelenburg,-facet loading, -SLRT, -CLRT, +FABER on L, -FADIR Normal muscle strength, sensation to light touch and DTRs in LEs bilaterally   PSYCH: pleasant and cooperative, no obvious depression or anxiety  ASSESSMENT AND PLAN:  Discussed the following assessment and plan:  Low back pain - Plan: Ambulatory referral to Physical Medicine Rehab  -likely sacroiliitis per exam - discussed options - she is not keen on doing oral meds. Will refer to PMR. Adivsed heat, stretching, topical capsacin or menthol in the meantime. -Patient advised to return or notify a doctor immediately if symptoms worsen or persist or new concerns arise.  There are no Patient Instructions on file for this visit.   Kriste Basque R.

## 2012-07-29 NOTE — Patient Instructions (Signed)
-  We placed a referral for you as discussed. It usually takes about 1-2 weeks to process and schedule this referral. If you have not heard from [place] regarding this appointment in 2 weeks please contact our office.  -please use heat and topical menthol or capsasin sports creams for now and do gentle stretching

## 2012-08-16 ENCOUNTER — Other Ambulatory Visit: Payer: Self-pay | Admitting: Anesthesiology

## 2012-08-16 DIAGNOSIS — Z1211 Encounter for screening for malignant neoplasm of colon: Secondary | ICD-10-CM

## 2012-09-06 ENCOUNTER — Encounter: Payer: Self-pay | Admitting: Internal Medicine

## 2012-09-06 ENCOUNTER — Ambulatory Visit (INDEPENDENT_AMBULATORY_CARE_PROVIDER_SITE_OTHER): Payer: Medicare Other | Admitting: Internal Medicine

## 2012-09-06 VITALS — BP 140/70 | HR 80 | Temp 98.3°F | Resp 16 | Ht 61.5 in | Wt 150.0 lb

## 2012-09-06 DIAGNOSIS — M62830 Muscle spasm of back: Secondary | ICD-10-CM

## 2012-09-06 DIAGNOSIS — IMO0001 Reserved for inherently not codable concepts without codable children: Secondary | ICD-10-CM

## 2012-09-06 DIAGNOSIS — M549 Dorsalgia, unspecified: Secondary | ICD-10-CM

## 2012-09-06 DIAGNOSIS — M5380 Other specified dorsopathies, site unspecified: Secondary | ICD-10-CM

## 2012-09-06 LAB — BASIC METABOLIC PANEL
BUN: 27 mg/dL — ABNORMAL HIGH (ref 6–23)
CO2: 28 mEq/L (ref 19–32)
Calcium: 10.1 mg/dL (ref 8.4–10.5)
Chloride: 101 mEq/L (ref 96–112)
Creatinine, Ser: 1.2 mg/dL (ref 0.4–1.2)
GFR: 46.34 mL/min — ABNORMAL LOW (ref >60.00)
Glucose, Bld: 187 mg/dL — ABNORMAL HIGH (ref 70–99)
Potassium: 4.6 mEq/L (ref 3.5–5.1)
Sodium: 139 mEq/L (ref 135–145)

## 2012-09-06 LAB — HEMOGLOBIN A1C: Hgb A1c MFr Bld: 7.3 % — ABNORMAL HIGH (ref 4.6–6.5)

## 2012-09-06 MED ORDER — KETOROLAC TROMETHAMINE 30 MG/ML IJ SOLN: 30.0000 mg | Freq: Once | INTRAMUSCULAR | Status: AC

## 2012-09-06 MED ORDER — KETOROLAC TROMETHAMINE 30 MG/ML IM SOLN: 30.0000 mg | Freq: Once | INTRAMUSCULAR | Status: DC

## 2012-09-06 MED ORDER — KETOROLAC TROMETHAMINE 30 MG/ML IJ SOLN
30.0000 mg | Freq: Once | INTRAMUSCULAR | Status: AC
  Administered 2012-09-06: 30 mg via INTRAMUSCULAR

## 2012-09-06 NOTE — Progress Notes (Signed)
Subjective:    Patient ID: Patricia Moore, female    DOB: 1933/10/30, 79 y.o.   MRN: [id number]  HPI Moderate persistant back pain. The patient states that the pain is worse with bending, sitting in car, and In bed. Standing is not a problem She went to orthopedic group and had "xrays" that were said to be "normal" but she still has pain duration is 6 months. Location left at [facility] joint "topicals and heat" helps  Her DM has been stable     Review of Systems     Objective:   Physical Exam  Constitutional: She appears well-developed and well-nourished.  Eyes: Conjunctivae are normal. Pupils are equal, round, and reactive to light.  Neck: Normal range of motion. Neck supple.  Cardiovascular: Normal rate, regular rhythm and normal heart sounds.   Pulmonary/Chest: Breath sounds normal.    All lung fields and heart are normal abdomen is soft   pressure on the left hip did not elucidate any pain however pressure over the left SI joint did recreate her discomfort.  The pain is specific to the left side no apparent rash no apparent traum      Assessment & Plan:  The left sacroiliitis may be referred from lumbar disc or may be true  iliac inflammation Due to the length  of the pain and the severity of the pain we'll obtain an MRI of the LS-spine His pain has been present for greater than 6 months she was referred to an orthopedist did plain films and she was treated with muscle relaxants and pain medicine by her gynecologist.   Will give her a shot for call and schedule her for the MRI

## 2012-09-06 NOTE — Addendum Note (Signed)
Addended by: Willy Eddy on: 09/06/2012 04:35 PM   Modules accepted: Orders

## 2012-09-06 NOTE — Patient Instructions (Signed)
Expect call for MRI next week

## 2012-09-11 ENCOUNTER — Ambulatory Visit (HOSPITAL_COMMUNITY)
Admission: RE | Admit: 2012-09-11 | Discharge: 2012-09-11 | Disposition: A | Discharge status: Home / Self Care | Payer: Medicare Other | Source: Ambulatory Visit | Attending: Internal Medicine | Admitting: Internal Medicine

## 2012-09-11 DIAGNOSIS — D259 Leiomyoma of uterus, unspecified: Secondary | ICD-10-CM | POA: Insufficient documentation

## 2012-09-11 DIAGNOSIS — M549 Dorsalgia, unspecified: Secondary | ICD-10-CM

## 2012-09-11 DIAGNOSIS — R29898 Other symptoms and signs involving the musculoskeletal system: Secondary | ICD-10-CM | POA: Insufficient documentation

## 2012-09-11 DIAGNOSIS — M5126 Other intervertebral disc displacement, lumbar region: Secondary | ICD-10-CM | POA: Insufficient documentation

## 2012-09-13 ENCOUNTER — Encounter: Payer: Self-pay | Admitting: Internal Medicine

## 2012-11-01 ENCOUNTER — Telehealth: Payer: Self-pay | Admitting: Internal Medicine

## 2012-11-01 MED ORDER — CIPROFLOXACIN HCL 500 MG PO TABS: 500.0000 mg | ORAL_TABLET | Freq: Two times a day (BID) | ORAL | Status: DC

## 2012-11-01 NOTE — Telephone Encounter (Signed)
Pt will be traveling (7/23) for [place] and would like a rx  For ciprofloxacin (CIPRO) 500 MG tablet. Pharm: [facility]

## 2012-11-01 NOTE — Telephone Encounter (Signed)
Ok for 7 days per dr Windy Canny sent in

## 2013-01-07 ENCOUNTER — Other Ambulatory Visit: Payer: Self-pay | Admitting: Internal Medicine

## 2013-01-07 ENCOUNTER — Other Ambulatory Visit: Payer: Self-pay | Admitting: *Deleted

## 2013-01-08 ENCOUNTER — Ambulatory Visit (INDEPENDENT_AMBULATORY_CARE_PROVIDER_SITE_OTHER): Payer: Medicare Other | Admitting: Internal Medicine

## 2013-01-08 ENCOUNTER — Encounter: Payer: Self-pay | Admitting: Internal Medicine

## 2013-01-08 ENCOUNTER — Other Ambulatory Visit: Payer: Self-pay | Admitting: *Deleted

## 2013-01-08 VITALS — BP 140/80 | HR 72 | Temp 98.2°F | Resp 16 | Ht 61.5 in | Wt 150.0 lb

## 2013-01-08 DIAGNOSIS — Z23 Encounter for immunization: Secondary | ICD-10-CM

## 2013-01-08 DIAGNOSIS — E1310 Other specified diabetes mellitus with ketoacidosis without coma: Secondary | ICD-10-CM

## 2013-01-08 DIAGNOSIS — E1110 Type 2 diabetes mellitus with ketoacidosis without coma: Secondary | ICD-10-CM

## 2013-01-08 DIAGNOSIS — I10 Essential (primary) hypertension: Secondary | ICD-10-CM

## 2013-01-08 DIAGNOSIS — R0602 Shortness of breath: Secondary | ICD-10-CM

## 2013-01-08 DIAGNOSIS — R609 Edema, unspecified: Secondary | ICD-10-CM

## 2013-01-08 LAB — HEPATIC FUNCTION PANEL
ALT: 21 U/L (ref 0–35)
AST: 28 U/L (ref 0–37)
Albumin: 4.5 g/dL (ref 3.5–5.2)
Alkaline Phosphatase: 66 U/L (ref 39–117)
Bilirubin, Direct: 0 mg/dL (ref 0.0–0.3)
Total Bilirubin: 0.6 mg/dL (ref 0.3–1.2)
Total Protein: 8.1 g/dL (ref 6.0–8.3)

## 2013-01-08 LAB — BASIC METABOLIC PANEL
BUN: 31 mg/dL — ABNORMAL HIGH (ref 6–23)
CO2: 32 mEq/L (ref 19–32)
Calcium: 10.3 mg/dL (ref 8.4–10.5)
Chloride: 102 mEq/L (ref 96–112)
Creatinine, Ser: 1 mg/dL (ref 0.4–1.2)
GFR: 57.81 mL/min — ABNORMAL LOW (ref >60.00)
Glucose, Bld: 82 mg/dL (ref 70–99)
Potassium: 4.6 mEq/L (ref 3.5–5.1)
Sodium: 138 mEq/L (ref 135–145)

## 2013-01-08 LAB — HEMOGLOBIN A1C: Hgb A1c MFr Bld: 7.1 % — ABNORMAL HIGH (ref 4.6–6.5)

## 2013-01-08 MED ORDER — GLYBURIDE 2.5 MG PO TABS: 2.5000 mg | ORAL_TABLET | Freq: Two times a day (BID) | ORAL | Status: DC

## 2013-01-08 NOTE — Addendum Note (Signed)
Addended by: Rita Ohara R on: 01/08/2013 02:28 PM   Modules accepted: Orders

## 2013-01-08 NOTE — Patient Instructions (Signed)
The patient is instructed to continue all medications as prescribed. Schedule followup with check out clerk upon leaving the clinic  

## 2013-01-08 NOTE — Progress Notes (Signed)
Subjective:    Patient ID: Patricia Moore, female    DOB: December 30, 1933, 79 y.o.   MRN: [id number]  HPI Follow up of DM Blood pressure stable The CBG's were variable with the back treatments Tolerating the metformin and the diabeta   Review of Systems  Constitutional: Negative for activity change, appetite change and fatigue.  HENT: Negative for ear pain, congestion, neck pain, postnasal drip and sinus pressure.   Eyes: Negative for redness and visual disturbance.  Respiratory: Negative for cough, shortness of breath and wheezing.   Cardiovascular: Positive for leg swelling.  Gastrointestinal: Positive for constipation. Negative for abdominal pain and abdominal distention.  Genitourinary: Negative for dysuria, frequency and menstrual problem.  Musculoskeletal: Negative for myalgias, joint swelling and arthralgias.  Skin: Negative for rash and wound.  Neurological: Negative for dizziness, weakness and headaches.  Hematological: Negative for adenopathy. Does not bruise/bleed easily.  Psychiatric/Behavioral: Negative for sleep disturbance and decreased concentration.   Past Medical History  Diagnosis Date  . Allergy   . Hypertension   . Osteoporosis   . Diabetes mellitus   . Back pain   . Arthritis   . Hx of adenomatous colonic polyps   . Diverticulosis   . Hyperlipidemia   . H. pylori infection 2001  . Intraductal papilloma   . 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 Emily Filbert    History   Social History  . Marital Status: Married    Spouse Name: N/A    Number of Children: N/A  . Years of Education: N/A   Occupational History  . Not on file.   Social History Main Topics  . Smoking status: Never Smoker   . Smokeless tobacco: Never Used  . Alcohol Use: Not on file     Comment: OCCASIONALLY  . Drug Use: No  . Sexual Activity: No   Other Topics Concern  . Not on file   Social History Narrative  . No  narrative on file    Past Surgical History  Procedure Laterality Date  . Breast biopsy  '91, '95    INTRADUCTAL PAPILLOMA  . Breast lumpectomy    . Esophagogastroduodenoscopy    . Colonoscopy  2001    ADENOMATOUS COLONIC  POLYP    Family History  Problem Relation Age of Onset  . Adopted: Yes  . Hypertension    . Diabetes    . Heart disease Mother   . Heart disease Father   . Breast cancer Maternal Aunt   . Stroke Maternal Aunt   . Colon cancer Paternal Uncle   . Cancer Maternal Grandfather     COLON,, FAMILY HX 7 MEMBERS  . Breast cancer Maternal Aunt     No Known Allergies  Current Outpatient Prescriptions on File Prior to Visit  Medication Sig Dispense Refill  . ALPRAZolam (XANAX) 0.25 MG tablet TAKE ONE TABLET BY MOUTH THREE TIMES DAILY AS NEEDED  30 tablet  3  . capsicum (ZOSTRIX) 0.075 % topical cream Apply 1 application topically 3 (three) times daily.  28.3 g  3  . furosemide (LASIX) 20 MG tablet TAKE ONE TABLET BY MOUTH EVERY DAY  60 tablet  11  . ketorolac (TORADOL) 30 MG/ML injection Inject 1 mL (30 mg total) into the muscle once.  1 mL  0  . Lancets (ONETOUCH ULTRASOFT) lancets USE AS DIRECTED  300 each  6  . metFORMIN (GLUCOPHAGE) 500 MG tablet Take 1 tablet (  500 mg total) by mouth 2 (two) times daily with a meal.  180 tablet  3  . NONFORMULARY OR COMPOUNDED ITEM Estradiol .02% 1 ML Prefilled Applicator Sig: apply vaginally twice a week #90 Day Supply with 4 refills  1 each  4  . olmesartan (BENICAR) 40 MG tablet Take 40 mg by mouth daily.        . ONE TOUCH ULTRA TEST test strip TEST 4 TIMES DAILY  200 each  6  . vitamin C (ASCORBIC ACID) 500 MG tablet Take 500 mg by mouth daily.        . Vitamin D, Ergocalciferol, (DRISDOL) 50000 UNITS CAPS Take 1 capsule (50,000 Units total) by mouth every 7 (seven) days.  5 capsule  6   No current facility-administered medications on file prior to visit.    BP 140/80  Pulse 72  Temp(Src) 98.2 F (36.8 C)  Resp 16   Ht 5' 1.5" (1.562 m)  Wt 150 lb (68.04 kg)  BMI 27.89 kg/m2  LMP 04/17/1988       Objective:   Physical Exam  Nursing note and vitals reviewed. Constitutional: She is oriented to person, place, and time. She appears well-developed and well-nourished. No distress.  HENT:  Head: Normocephalic and atraumatic.  Right Ear: External ear normal.  Left Ear: External ear normal.  Nose: Nose normal.  Mouth/Throat: Oropharynx is clear and moist.  Eyes: Conjunctivae and EOM are normal. Pupils are equal, round, and reactive to light.  Neck: Normal range of motion. Neck supple. No JVD present. No tracheal deviation present. No thyromegaly present.  Cardiovascular: Normal rate and regular rhythm.   Murmur heard. Pulmonary/Chest: Effort normal and breath sounds normal. She has no wheezes. She exhibits no tenderness.  Abdominal: Soft. Bowel sounds are normal.  Musculoskeletal: She exhibits edema and tenderness.  Lymphadenopathy:    She has no cervical adenopathy.  Neurological: She is alert and oriented to person, place, and time. She has normal reflexes. No cranial nerve deficit.  Skin: Skin is warm and dry. She is not diaphoretic.  Psychiatric: She has a normal mood and affect. Her behavior is normal.          Assessment & Plan:  Had stopped the lasix and had swelling in the feet worsened. Has elevated feet Possible kidney infection? Took cipro and she is better Monitor  urine

## 2013-01-10 ENCOUNTER — Ambulatory Visit: Payer: Medicare Other | Admitting: Internal Medicine

## 2013-01-27 ENCOUNTER — Encounter: Payer: Self-pay | Admitting: Family

## 2013-01-27 ENCOUNTER — Ambulatory Visit (INDEPENDENT_AMBULATORY_CARE_PROVIDER_SITE_OTHER): Payer: Medicare Other | Admitting: Family

## 2013-01-27 VITALS — BP 160/78 | HR 65 | Temp 98.2°F | Wt 152.0 lb

## 2013-01-27 DIAGNOSIS — K5732 Diverticulitis of large intestine without perforation or abscess without bleeding: Secondary | ICD-10-CM

## 2013-01-27 DIAGNOSIS — R1032 Left lower quadrant pain: Secondary | ICD-10-CM

## 2013-01-27 MED ORDER — CIPROFLOXACIN HCL 500 MG PO TABS: 500.0000 mg | ORAL_TABLET | Freq: Two times a day (BID) | ORAL | Status: DC

## 2013-01-27 NOTE — Progress Notes (Signed)
Subjective:    Patient ID: Patricia Moore, female    DOB: 18-Oct-1933, 79 y.o.   MRN: [id number]  HPI 79 year old female, nonsmoker, patient of Dr. Lovell Sheehan is in today with left lower quadrant pain x3 days and worsening. She has a history of diverticulosis. Denies any blood in her stools are dark black stools. Rates the pain a 6/10, worse with movement has not taken anything for relief.   Review of Systems  Constitutional: Negative.   Respiratory: Negative.   Cardiovascular: Negative.   Gastrointestinal: Positive for abdominal pain. Negative for diarrhea, blood in stool and abdominal distention.  Endocrine: Negative.   Genitourinary: Negative.   Musculoskeletal: Negative.   Skin: Negative.   Neurological: Negative.   Psychiatric/Behavioral: Negative.    Past Medical History  Diagnosis Date  . Allergy   . Hypertension   . Osteoporosis   . Diabetes mellitus   . Back pain   . Arthritis   . Hx of adenomatous colonic polyps   . Diverticulosis   . Hyperlipidemia   . H. pylori infection 2001  . Intraductal papilloma   . 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 Emily Filbert    History   Social History  . Marital Status: Married    Spouse Name: N/A    Number of Children: N/A  . Years of Education: N/A   Occupational History  . Not on file.   Social History Main Topics  . Smoking status: Never Smoker   . Smokeless tobacco: Never Used  . Alcohol Use: Not on file     Comment: OCCASIONALLY  . Drug Use: No  . Sexual Activity: No   Other Topics Concern  . Not on file   Social History Narrative  . No narrative on file    Past Surgical History  Procedure Laterality Date  . Breast biopsy  '91, '95    INTRADUCTAL PAPILLOMA  . Breast lumpectomy    . Esophagogastroduodenoscopy    . Colonoscopy  2001    ADENOMATOUS COLONIC  POLYP    Family History  Problem Relation Age of Onset  . Adopted: Yes  . Hypertension     . Diabetes    . Heart disease Mother   . Heart disease Father   . Breast cancer Maternal Aunt   . Stroke Maternal Aunt   . Colon cancer Paternal Uncle   . Cancer Maternal Grandfather     COLON,, FAMILY HX 7 MEMBERS  . Breast cancer Maternal Aunt     No Known Allergies  Current Outpatient Prescriptions on File Prior to Visit  Medication Sig Dispense Refill  . ALPRAZolam (XANAX) 0.25 MG tablet TAKE ONE TABLET BY MOUTH THREE TIMES DAILY AS NEEDED  30 tablet  3  . capsicum (ZOSTRIX) 0.075 % topical cream Apply 1 application topically 3 (three) times daily.  28.3 g  3  . furosemide (LASIX) 20 MG tablet TAKE ONE TABLET BY MOUTH EVERY DAY  60 tablet  11  . glyBURIDE (DIABETA) 2.5 MG tablet Take 1 tablet (2.5 mg total) by mouth 2 (two) times daily with a meal.  180 tablet  3  . ketorolac (TORADOL) 30 MG/ML injection Inject 1 mL (30 mg total) into the muscle once.  1 mL  0  . Lancets (ONETOUCH ULTRASOFT) lancets USE AS DIRECTED  300 each  6  . metFORMIN (GLUCOPHAGE) 500 MG tablet Take 1 tablet (500 mg total) by mouth 2 (two)  times daily with a meal.  180 tablet  3  . NONFORMULARY OR COMPOUNDED ITEM Estradiol .02% 1 ML Prefilled Applicator Sig: apply vaginally twice a week #90 Day Supply with 4 refills  1 each  4  . olmesartan (BENICAR) 40 MG tablet Take 40 mg by mouth daily.        . ONE TOUCH ULTRA TEST test strip TEST 4 TIMES DAILY  200 each  6  . vitamin C (ASCORBIC ACID) 500 MG tablet Take 500 mg by mouth daily.        . Vitamin D, Ergocalciferol, (DRISDOL) 50000 UNITS CAPS Take 1 capsule (50,000 Units total) by mouth every 7 (seven) days.  5 capsule  6   No current facility-administered medications on file prior to visit.    BP 160/78  Pulse 65  Temp(Src) 98.2 F (36.8 C) (Oral)  Wt 152 lb (68.947 kg)  BMI 28.26 kg/m2  SpO2 97%  LMP 01/01/1990chart    Objective:   Physical Exam  Constitutional: She is oriented to person, place, and time. She appears well-developed and  well-nourished.  Neck: Normal range of motion. Neck supple.  Cardiovascular: Normal rate and normal heart sounds.   Pulmonary/Chest: Effort normal and breath sounds normal.  Abdominal: Soft. There is tenderness.  Left lower quadrant tenderness  Neurological: She is alert and oriented to person, place, and time.  Skin: Skin is warm and dry.  Psychiatric: She has a normal mood and affect.          Assessment & Plan:  Assessment: 1. Left lower quadrant pain 2. Diverticulitis  Plan: Cipro 500 mg 1 tab twice a day x10 days. Drink plenty of fluids. Patient on the opposite symptoms worsen or persist. Recheck as scheduled, and as needed.

## 2013-01-27 NOTE — Patient Instructions (Signed)
Diverticulitis °A diverticulum is a small pouch or sac on the colon. Diverticulosis is the presence of these diverticula on the colon. Diverticulitis is the irritation (inflammation) or infection of diverticula. °CAUSES  °The colon and its diverticula contain bacteria. If food particles block the tiny opening to a diverticulum, the bacteria inside can grow and cause an increase in pressure. This leads to infection and inflammation and is called diverticulitis. °SYMPTOMS  °· Abdominal pain and tenderness. Usually, the pain is located on the left side of your abdomen. However, it could be located elsewhere. °· Fever. °· Bloating. °· Feeling sick to your stomach (nausea). °· Throwing up (vomiting). °· Abnormal stools. °DIAGNOSIS  °Your caregiver will take a history and perform a physical exam. Since many things can cause abdominal pain, other tests may be necessary. Tests may include: °· Blood tests. °· Urine tests. °· X-ray of the abdomen. °· CT scan of the abdomen. °Sometimes, surgery is needed to determine if diverticulitis or other conditions are causing your symptoms. °TREATMENT  °Most of the time, you can be treated without surgery. Treatment includes: °· Resting the bowels by only having liquids for a few days. As you improve, you will need to eat a low-fiber diet. °· Intravenous (IV) fluids if you are losing body fluids (dehydrated). °· Antibiotic medicines that treat infections may be given. °· Pain and nausea medicine, if needed. °· Surgery if the inflamed diverticulum has burst. °HOME CARE INSTRUCTIONS  °· Try a clear liquid diet (broth, tea, or water for as long as directed by your caregiver). You may then gradually begin a low-fiber diet as tolerated.  °A low-fiber diet is a diet with less than 10 grams of fiber. Choose the foods below to reduce fiber in the diet: °· White breads, cereals, rice, and pasta. °· Cooked fruits and vegetables or soft fresh fruits and vegetables without the skin. °· Ground or  well-cooked tender beef, ham, veal, lamb, pork, or poultry. °· Eggs and seafood. °· After your diverticulitis symptoms have improved, your caregiver may put you on a high-fiber diet. A high-fiber diet includes 14 grams of fiber for every 1000 calories consumed. For a standard 2000 calorie diet, you would need 28 grams of fiber. Follow these diet guidelines to help you increase the fiber in your diet. It is important to slowly increase the amount fiber in your diet to avoid gas, constipation, and bloating. °· Choose whole-grain breads, cereals, pasta, and brown rice. °· Choose fresh fruits and vegetables with the skin on. Do not overcook vegetables because the more vegetables are cooked, the more fiber is lost. °· Choose more nuts, seeds, legumes, dried peas, beans, and lentils. °· Look for food products that have greater than 3 grams of fiber per serving on the Nutrition Facts label. °· Take all medicine as directed by your caregiver. °· If your caregiver has given you a follow-up appointment, it is very important that you go. Not going could result in lasting (chronic) or permanent injury, pain, and disability. If there is any problem keeping the appointment, call to reschedule. °SEEK MEDICAL CARE IF:  °· Your pain does not improve. °· You have a hard time advancing your diet beyond clear liquids. °· Your bowel movements do not return to normal. °SEEK IMMEDIATE MEDICAL CARE IF:  °· Your pain becomes worse. °· You have an oral temperature above 102° F (38.9° C), not controlled by medicine. °· You have repeated vomiting. °· You have bloody or black, tarry stools. °·   Symptoms that brought you to your caregiver become worse or are not getting better. °MAKE SURE YOU:  °· Understand these instructions. °· Will watch your condition. °· Will get help right away if you are not doing well or get worse. °Document Released: 01/11/2005 Document Revised: 06/26/2011 Document Reviewed: 05/09/2010 °[employer]® Patient Information  ©2014 [employer]. ° °

## 2013-03-19 ENCOUNTER — Other Ambulatory Visit: Payer: Self-pay | Admitting: Internal Medicine

## 2013-03-31 ENCOUNTER — Other Ambulatory Visit: Payer: Self-pay | Admitting: Internal Medicine

## 2013-04-01 ENCOUNTER — Telehealth: Payer: Self-pay | Admitting: Internal Medicine

## 2013-04-01 NOTE — Telephone Encounter (Signed)
Absolutely refuses to see anyone else. Wants dr Lovell Sheehan to see message and tell her what to do

## 2013-04-01 NOTE — Telephone Encounter (Signed)
Pt has pain in her back for 2 mos. Now pain in elbow, up to neck.  Pain has gone to left side. somedays it is better, then gets worse again. Also BS is up and down. Pt refused another provider. Pt states dr Lovell Sheehan said to let him know when she has pain and he would see her.. pls advise

## 2013-04-02 NOTE — Telephone Encounter (Signed)
Patricia Moore  To call for 2 pm

## 2013-04-02 NOTE — Telephone Encounter (Signed)
Use one of the blocks on Friday for her

## 2013-04-04 ENCOUNTER — Ambulatory Visit (INDEPENDENT_AMBULATORY_CARE_PROVIDER_SITE_OTHER): Payer: Medicare Other | Admitting: Internal Medicine

## 2013-04-04 ENCOUNTER — Encounter: Payer: Self-pay | Admitting: Internal Medicine

## 2013-04-04 VITALS — BP 144/80 | HR 76 | Temp 98.2°F | Resp 16 | Ht 62.0 in | Wt 149.0 lb

## 2013-04-04 DIAGNOSIS — IMO0001 Reserved for inherently not codable concepts without codable children: Secondary | ICD-10-CM

## 2013-04-04 DIAGNOSIS — R079 Chest pain, unspecified: Secondary | ICD-10-CM

## 2013-04-04 DIAGNOSIS — M25512 Pain in left shoulder: Secondary | ICD-10-CM

## 2013-04-04 DIAGNOSIS — M549 Dorsalgia, unspecified: Secondary | ICD-10-CM

## 2013-04-04 DIAGNOSIS — M25519 Pain in unspecified shoulder: Secondary | ICD-10-CM

## 2013-04-04 LAB — TROPONIN I: Troponin I: 0.02 ng/mL (ref <0.06)

## 2013-04-04 LAB — CK TOTAL AND CKMB (NOT AT ARMC)
CK, MB: 1.9 ng/mL (ref 0.0–5.0)
Total CK: 67 U/L (ref 7–177)

## 2013-04-04 LAB — SEDIMENTATION RATE: Sed Rate: 28 mm/hr — ABNORMAL HIGH (ref 0–22)

## 2013-04-04 MED ORDER — BACLOFEN 10 MG PO TABS: 10.0000 mg | ORAL_TABLET | Freq: Three times a day (TID) | ORAL | Status: DC

## 2013-04-04 MED ORDER — METHYLPREDNISOLONE 4 MG PO TABS: 4.0000 mg | ORAL_TABLET | Freq: Every day | ORAL | Status: DC

## 2013-04-04 NOTE — Progress Notes (Signed)
Pre visit review using our clinic review tool, if applicable. No additional management support is needed unless otherwise documented below in the visit note. 

## 2013-04-04 NOTE — Progress Notes (Signed)
Subjective:    Patient ID: Patricia Moore, female    DOB: 09/10/1933, 79 y.o.   MRN: [id number]  HPI The patient has been feeling a lot of pain in mid upper pack into shoulder neck and triceps and face Can "feel the pain" when she touches the muscle " and feels a little short of breath. Has been going on for two months Cannot wear bras due to the pain. Has still walked on treadmil and feels a bit short of breath with exercise  And has slowed down and it lessened. Had claudication in legs in the recent past.noted some heard beets Has DM   Review of Systems  Constitutional: Positive for fatigue. Negative for diaphoresis.  HENT: Negative.   Eyes: Negative.   Respiratory: Positive for shortness of breath. Negative for wheezing and stridor.   Cardiovascular: Positive for chest pain and palpitations.  Endocrine: Negative.   Genitourinary: Negative.   Neurological: Negative.   Hematological: Negative.   Psychiatric/Behavioral: Negative.    Past Medical History  Diagnosis Date  . Allergy   . Hypertension   . Osteoporosis   . Diabetes mellitus   . Back pain   . Arthritis   . Hx of adenomatous colonic polyps   . Diverticulosis   . Hyperlipidemia   . H. pylori infection 2001  . Intraductal papilloma   . 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 Emily Filbert    History   Social History  . Marital Status: Married    Spouse Name: N/A    Number of Children: N/A  . Years of Education: N/A   Occupational History  . Not on file.   Social History Main Topics  . Smoking status: Never Smoker   . Smokeless tobacco: Never Used  . Alcohol Use: Not on file     Comment: OCCASIONALLY  . Drug Use: No  . Sexual Activity: No   Other Topics Concern  . Not on file   Social History Narrative  . No narrative on file    Past Surgical History  Procedure Laterality Date  . Breast biopsy  '91, '95    INTRADUCTAL PAPILLOMA  .  Breast lumpectomy    . Esophagogastroduodenoscopy    . Colonoscopy  2001    ADENOMATOUS COLONIC  POLYP    Family History  Problem Relation Age of Onset  . Adopted: Yes  . Hypertension    . Diabetes    . Heart disease Mother   . Heart disease Father   . Breast cancer Maternal Aunt   . Stroke Maternal Aunt   . Colon cancer Paternal Uncle   . Cancer Maternal Grandfather     COLON,, FAMILY HX 7 MEMBERS  . Breast cancer Maternal Aunt     No Known Allergies  Current Outpatient Prescriptions on File Prior to Visit  Medication Sig Dispense Refill  . ALPRAZolam (XANAX) 0.25 MG tablet TAKE ONE TABLET BY MOUTH THREE TIMES DAILY AS NEEDED  30 tablet  3  . capsicum (ZOSTRIX) 0.075 % topical cream Apply 1 application topically 3 (three) times daily.  28.3 g  3  . furosemide (LASIX) 20 MG tablet TAKE ONE TABLET BY MOUTH EVERY DAY  60 tablet  11  . glucose blood test strip Use on strip 4 times a day  120 each  0  . glyBURIDE (DIABETA) 2.5 MG tablet Take 1 tablet (2.5 mg total) by mouth 2 (two) times  daily with a meal.  180 tablet  3  . Lancets (ONETOUCH ULTRASOFT) lancets USE AS DIRECTED  300 each  6  . metFORMIN (GLUCOPHAGE) 500 MG tablet TAKE ONE TABLET BY MOUTH TWICE DAILY WITH A MEAL.  180 tablet  3  . NONFORMULARY OR COMPOUNDED ITEM Estradiol .02% 1 ML Prefilled Applicator Sig: apply vaginally twice a week #90 Day Supply with 4 refills  1 each  4  . olmesartan (BENICAR) 40 MG tablet Take 40 mg by mouth daily.        . vitamin C (ASCORBIC ACID) 500 MG tablet Take 500 mg by mouth daily.        . Vitamin D, Ergocalciferol, (DRISDOL) 50000 UNITS CAPS Take 1 capsule (50,000 Units total) by mouth every 7 (seven) days.  5 capsule  6   No current facility-administered medications on file prior to visit.    BP 144/80  Pulse 76  Temp(Src) 98.2 F (36.8 C)  Resp 16  Ht 5\' 2"  (1.575 m)  Wt 149 lb (67.586 kg)  BMI 27.25 kg/m2  LMP 04/17/1988       Objective:   Physical Exam  Nursing  note and vitals reviewed. Constitutional: She is oriented to person, place, and time. She appears well-developed and well-nourished. No distress.  HENT:  Head: Normocephalic and atraumatic.  Eyes: Conjunctivae and EOM are normal. Pupils are equal, round, and reactive to light.  Neck: Normal range of motion. Neck supple. No JVD present. No tracheal deviation present. No thyromegaly present.  Cardiovascular: Regular rhythm and intact distal pulses.   Murmur heard. Pulmonary/Chest: Effort normal and breath sounds normal. She has no wheezes. She exhibits no tenderness.  Abdominal: Soft. Bowel sounds are normal.  Musculoskeletal: Normal range of motion. She exhibits tenderness. She exhibits no edema.  Lymphadenopathy:    She has no cervical adenopathy.  Neurological: She is alert and oriented to person, place, and time. She has normal reflexes. No cranial nerve deficit.  Skin: Skin is warm and dry. She is not diaphoretic.  No rash  Psychiatric: She has a normal mood and affect. Her behavior is normal.          Assessment & Plan:  reveiwe EKG and scheduel for stress myoview due to increased risk of this representing unstable angina Components of PMR? Trial of low does medrol even though she has DM? No respiratory symptoms No fever No pattern of shingles.

## 2013-04-04 NOTE — Patient Instructions (Signed)
Call or go to the ER id shortness of breath worseness or you develop nausea

## 2013-04-07 ENCOUNTER — Telehealth: Payer: Self-pay | Admitting: Internal Medicine

## 2013-04-07 NOTE — Telephone Encounter (Signed)
To: [facility] (After Hours Triage) Fax: [phone] From: [facility] Date/ Time: 04/04/2013 7:37 PM Taken By: Achille Rich, CSR Caller: Shanda Bumps Facility: [facility] Patient: Patricia Moore, Patricia Moore DOB: 04/12/1934 Phone: [phone] Reason for Call: Shanda Bumps is calling from [facility] regarding a ck total, ckmb, triponen 1 ordered on Patricia Moore by Dr. Jonny Ruiz Jenkins12/19/2014 2:43:00 PM. The results were CKtoal:67, CKMB:1.9, triponen:0.02. no indication of myocardial injury.

## 2013-04-28 ENCOUNTER — Other Ambulatory Visit: Payer: Self-pay | Admitting: Internal Medicine

## 2013-05-02 ENCOUNTER — Other Ambulatory Visit: Payer: Self-pay | Admitting: *Deleted

## 2013-05-02 DIAGNOSIS — R079 Chest pain, unspecified: Secondary | ICD-10-CM

## 2013-05-09 ENCOUNTER — Encounter: Payer: Self-pay | Admitting: Internal Medicine

## 2013-05-21 ENCOUNTER — Encounter: Payer: Self-pay | Admitting: Internal Medicine

## 2013-05-21 ENCOUNTER — Ambulatory Visit (INDEPENDENT_AMBULATORY_CARE_PROVIDER_SITE_OTHER)
Admission: RE | Admit: 2013-05-21 | Discharge: 2013-05-21 | Disposition: A | Discharge status: Home / Self Care | Payer: Medicare Other | Source: Ambulatory Visit | Attending: Internal Medicine | Admitting: Internal Medicine

## 2013-05-21 ENCOUNTER — Ambulatory Visit (INDEPENDENT_AMBULATORY_CARE_PROVIDER_SITE_OTHER): Payer: Medicare Other | Admitting: Internal Medicine

## 2013-05-21 VITALS — BP 130/78 | HR 76 | Temp 98.0°F | Resp 16 | Ht 62.0 in | Wt 150.0 lb

## 2013-05-21 DIAGNOSIS — E1169 Type 2 diabetes mellitus with other specified complication: Secondary | ICD-10-CM

## 2013-05-21 DIAGNOSIS — E1165 Type 2 diabetes mellitus with hyperglycemia: Secondary | ICD-10-CM

## 2013-05-21 DIAGNOSIS — IMO0002 Reserved for concepts with insufficient information to code with codable children: Secondary | ICD-10-CM

## 2013-05-21 DIAGNOSIS — R0602 Shortness of breath: Secondary | ICD-10-CM

## 2013-05-21 DIAGNOSIS — M479 Spondylosis, unspecified: Secondary | ICD-10-CM

## 2013-05-21 LAB — CBC WITH DIFFERENTIAL/PLATELET
Basophils Absolute: 0 10*3/uL (ref 0.0–0.1)
Basophils Relative: 0.5 % (ref 0.0–3.0)
Eosinophils Absolute: 1 10*3/uL — ABNORMAL HIGH (ref 0.0–0.7)
Eosinophils Relative: 9.5 % — ABNORMAL HIGH (ref 0.0–5.0)
HCT: 42.8 % (ref 36.0–46.0)
Hemoglobin: 14.5 g/dL (ref 12.0–15.0)
Lymphocytes Relative: 26.1 % (ref 12.0–46.0)
Lymphs Abs: 2.7 10*3/uL (ref 0.7–4.0)
MCHC: 33.9 g/dL (ref 30.0–36.0)
MCV: 92.6 fl (ref 78.0–100.0)
Monocytes Absolute: 0.8 10*3/uL (ref 0.1–1.0)
Monocytes Relative: 7.7 % (ref 3.0–12.0)
Neutro Abs: 5.7 10*3/uL (ref 1.4–7.7)
Neutrophils Relative %: 56.2 % (ref 43.0–77.0)
Platelets: 375 10*3/uL (ref 150.0–400.0)
RBC: 4.62 Mil/uL (ref 3.87–5.11)
RDW: 13.2 % (ref 11.5–14.6)
WBC: 10.2 10*3/uL (ref 4.5–10.5)

## 2013-05-21 LAB — HEMOGLOBIN A1C: Hgb A1c MFr Bld: 7.6 % — ABNORMAL HIGH (ref 4.6–6.5)

## 2013-05-21 LAB — SEDIMENTATION RATE: Sed Rate: 36 mm/hr — ABNORMAL HIGH (ref 0–22)

## 2013-05-21 MED ORDER — CANAGLIFLOZIN-METFORMIN HCL 150-500 MG PO TABS
1.0000 | ORAL_TABLET | Freq: Every day | ORAL | Status: DC
Start: 2013-05-21 — End: 2013-12-09

## 2013-05-21 NOTE — Progress Notes (Signed)
Subjective:    Patient ID: Patricia Moore, female    DOB: 06/25/1933, 80 y.o.   MRN: [id number]  Hypertension Associated symptoms include neck pain, palpitations and shortness of breath.  Hyperlipidemia Associated symptoms include shortness of breath.  Diabetes Associated symptoms include fatigue and polyphagia.  Neck Pain  Associated symptoms include numbness.   Better but still has some back and joint pain and numbness in fingers carple tunnel? Has note some elevated pulses And has awaked with a rapid pulse without sweating   Review of Systems  Constitutional: Positive for fatigue.  HENT: Positive for rhinorrhea.   Respiratory: Positive for shortness of breath.   Cardiovascular: Positive for palpitations.  Endocrine: Positive for cold intolerance, heat intolerance and polyphagia.  Genitourinary: Positive for frequency.  Musculoskeletal: Positive for neck pain.  Allergic/Immunologic: Negative.   Neurological: Positive for numbness.   Past Medical History  Diagnosis Date  . Allergy   . Hypertension   . Osteoporosis   . Diabetes mellitus   . Back pain   . Arthritis   . Hx of adenomatous colonic polyps   . Diverticulosis   . Hyperlipidemia   . H. pylori infection 2001  . Intraductal papilloma   . 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 Emily Filbert    History   Social History  . Marital Status: Married    Spouse Name: N/A    Number of Children: N/A  . Years of Education: N/A   Occupational History  . Not on file.   Social History Main Topics  . Smoking status: Never Smoker   . Smokeless tobacco: Never Used  . Alcohol Use: Not on file     Comment: OCCASIONALLY  . Drug Use: No  . Sexual Activity: No   Other Topics Concern  . Not on file   Social History Narrative  . No narrative on file    Past Surgical History  Procedure Laterality Date  . Breast biopsy  '91, '95    INTRADUCTAL PAPILLOMA    . Breast lumpectomy    . Esophagogastroduodenoscopy    . Colonoscopy  2001    ADENOMATOUS COLONIC  POLYP    Family History  Problem Relation Age of Onset  . Adopted: Yes  . Hypertension    . Diabetes    . Heart disease Mother   . Heart disease Father   . Breast cancer Maternal Aunt   . Stroke Maternal Aunt   . Colon cancer Paternal Uncle   . Cancer Maternal Grandfather     COLON,, FAMILY HX 7 MEMBERS  . Breast cancer Maternal Aunt     No Known Allergies  Current Outpatient Prescriptions on File Prior to Visit  Medication Sig Dispense Refill  . ALPRAZolam (XANAX) 0.25 MG tablet TAKE ONE TABLET BY MOUTH THREE TIMES DAILY AS NEEDED  30 tablet  3  . baclofen (LIORESAL) 10 MG tablet Take 1 tablet (10 mg total) by mouth 3 (three) times daily.  30 each  0  . capsicum (ZOSTRIX) 0.075 % topical cream Apply 1 application topically 3 (three) times daily.  28.3 g  3  . furosemide (LASIX) 20 MG tablet TAKE ONE TABLET BY MOUTH EVERY DAY  60 tablet  11  . glucose blood test strip Use on strip 4 times a day  120 each  0  . glyBURIDE (DIABETA) 2.5 MG tablet Take 1 tablet (2.5 mg total) by mouth 2 (two)  times daily with a meal.  180 tablet  3  . Lancets (ONETOUCH ULTRASOFT) lancets USE AS DIRECTED  300 each  6  . metFORMIN (GLUCOPHAGE) 500 MG tablet TAKE ONE TABLET BY MOUTH TWICE DAILY WITH A MEAL.  180 tablet  3  . methylPREDNISolone (MEDROL) 4 MG tablet Take 1 tablet (4 mg total) by mouth daily. For 1 week the 1/2 tablets daily for 2 weeks  14 tablet  0  . NONFORMULARY OR COMPOUNDED ITEM Estradiol .02% 1 ML Prefilled Applicator Sig: apply vaginally twice a week #90 Day Supply with 4 refills  1 each  4  . olmesartan (BENICAR) 40 MG tablet Take 40 mg by mouth daily.        . vitamin C (ASCORBIC ACID) 500 MG tablet Take 500 mg by mouth daily.        . Vitamin D, Ergocalciferol, (DRISDOL) 50000 UNITS CAPS capsule TAKE 1 CAPSULE BY MOUTH EVERY 7 DAYS  5 capsule  0   No current  facility-administered medications on file prior to visit.    BP 130/78  Pulse 76  Temp(Src) 98 F (36.7 C)  Resp 16  Ht 5\' 2"  (1.575 m)  Wt 150 lb (68.04 kg)  BMI 27.43 kg/m2  LMP 04/17/1988       Objective:   Physical Exam  Nursing note reviewed. Constitutional: She appears well-developed and well-nourished.  HENT:  Head: Normocephalic and atraumatic.  Eyes: Conjunctivae are normal. Pupils are equal, round, and reactive to light.  Cardiovascular: Normal rate and regular rhythm.   Murmur heard. Abdominal: Bowel sounds are normal.  Musculoskeletal: Normal range of motion.  Skin: Skin is warm.          Assessment & Plan:  discussion gof DM and possible hypoglycemia? Change the diabeta and reduce the meformin?  CXR  If palipation we can do holter

## 2013-05-21 NOTE — Patient Instructions (Signed)
The patient is instructed to continue all medications as prescribed. Schedule followup with check out clerk upon leaving the clinic  

## 2013-05-26 ENCOUNTER — Other Ambulatory Visit: Payer: Self-pay | Admitting: Internal Medicine

## 2013-05-26 ENCOUNTER — Encounter: Payer: Self-pay | Admitting: Internal Medicine

## 2013-05-28 ENCOUNTER — Ambulatory Visit (INDEPENDENT_AMBULATORY_CARE_PROVIDER_SITE_OTHER): Payer: Medicare Other | Admitting: Family Medicine

## 2013-05-28 ENCOUNTER — Encounter: Payer: Self-pay | Admitting: Family Medicine

## 2013-05-28 VITALS — BP 124/60 | HR 84 | Temp 98.9°F | Ht 62.0 in | Wt 150.0 lb

## 2013-05-28 DIAGNOSIS — K5732 Diverticulitis of large intestine without perforation or abscess without bleeding: Secondary | ICD-10-CM

## 2013-05-28 MED ORDER — CIPROFLOXACIN HCL 500 MG PO TABS: 500.0000 mg | ORAL_TABLET | Freq: Two times a day (BID) | ORAL | Status: DC

## 2013-05-28 MED ORDER — METRONIDAZOLE 500 MG PO TABS: 500.0000 mg | ORAL_TABLET | Freq: Two times a day (BID) | ORAL | Status: DC

## 2013-05-28 NOTE — Progress Notes (Signed)
Subjective:    Patient ID: Patricia Moore, female    DOB: [birth year], 80 y.o.   MRN: [id number]  HPI Here for the onset yesterday of sharp pains in the LLQ. No urinary sx. She thinks she may have had a fever last night but not today. Her BM are normal, the last one was this am. She has had diverticulitis in the past, and she says this feels just like it did then.    Review of Systems  Constitutional: Negative.   Respiratory: Negative.   Cardiovascular: Negative.   Gastrointestinal: Positive for abdominal pain. Negative for nausea, vomiting, diarrhea, constipation, blood in stool, abdominal distention, anal bleeding and rectal pain.  Genitourinary: Negative.        Objective:   Physical Exam  Constitutional: She appears well-developed and well-nourished. No distress.  Abdominal: Soft. Bowel sounds are normal. She exhibits no distension and no mass. There is no rebound and no guarding.  Mildly tender in the LLQ           Assessment & Plan:  Drink fluids. Recheck prn

## 2013-05-28 NOTE — Progress Notes (Signed)
Pre visit review using our clinic review tool, if applicable. No additional management support is needed unless otherwise documented below in the visit note. 

## 2013-05-30 ENCOUNTER — Other Ambulatory Visit: Payer: Self-pay | Admitting: *Deleted

## 2013-05-30 DIAGNOSIS — M8580 Other specified disorders of bone density and structure, unspecified site: Secondary | ICD-10-CM

## 2013-06-05 ENCOUNTER — Other Ambulatory Visit: Payer: Self-pay | Admitting: *Deleted

## 2013-06-05 ENCOUNTER — Encounter: Payer: Self-pay | Admitting: Gynecology

## 2013-06-05 DIAGNOSIS — M8580 Other specified disorders of bone density and structure, unspecified site: Secondary | ICD-10-CM

## 2013-06-06 ENCOUNTER — Encounter: Payer: Self-pay | Admitting: Gynecology

## 2013-06-06 ENCOUNTER — Ambulatory Visit (INDEPENDENT_AMBULATORY_CARE_PROVIDER_SITE_OTHER): Payer: Medicare Other | Admitting: Gynecology

## 2013-06-06 VITALS — BP 148/80 | Ht 61.25 in | Wt 150.2 lb

## 2013-06-06 DIAGNOSIS — N952 Postmenopausal atrophic vaginitis: Secondary | ICD-10-CM

## 2013-06-06 DIAGNOSIS — R002 Palpitations: Secondary | ICD-10-CM

## 2013-06-06 DIAGNOSIS — R51 Headache: Secondary | ICD-10-CM

## 2013-06-06 DIAGNOSIS — Z800 Family history of malignant neoplasm of digestive organs: Secondary | ICD-10-CM

## 2013-06-06 DIAGNOSIS — D259 Leiomyoma of uterus, unspecified: Secondary | ICD-10-CM

## 2013-06-06 MED ORDER — NONFORMULARY OR COMPOUNDED ITEM: Status: DC

## 2013-06-06 NOTE — Progress Notes (Signed)
Patricia Moore Aug 16, 1933 [id number]   History:    80 y.o.  for GYN followup exam. Review records indicated that in February of last year she was seen with a complaint of vaginal dryness and irritation and an episode of postcoital bleeding as well as hirsutism. She had a 17 hydroxyprogesterone, DHEAS and total testosterone blood tests drawn which were all normal. She had an ultrasound which was normal size but had 4 small fibroids and ovaries were otherwise normal. She has informed me that she is no longer sexually active and is not taking the estrogen vaginal cream anymore.  Review of her record indicated she has history of vitamin D deficiency and has had osteopenia and her last bone density study done this year demonstrated the lowest T score at the AP spine and -1.1 and no stitches to call significant change from previous bone density study. Patient is currently on a drug-free holiday (patient had been on Actonel for several years in the past). Patient has a strong family history of colon cancer and she herself had a polyp removed in 2000 and will be scheduled for colonoscopy this year. Her mammogram this year was also normal. Patient with no prior history of abnormal Pap smears in the past. Patient has a past history of right Auricular squamous cell carcinoma and is being followed by her dermatologist once a year. Patient's immunizations are up-to-date.  The patient is being evaluated by her PCP Dr. Lovell Sheehan who has refer her to the cardiologist and next week she will be getting a stress test as a result of her palpitations and headaches. She was asymptomatic today and had normal blood pressure readings. Blood work drawn by her PCP.    Past medical history,surgical history, family history and social history were all reviewed and documented in the EPIC chart.  Gynecologic History Patient's last menstrual period was 04/17/1988. Contraception: post menopausal status Last Pap: 2012. Results were:  normal Last mammogram: 2015. Results were: normal  Obstetric History OB History  Gravida Para Term Preterm AB SAB TAB Ectopic Multiple Living  2 1   1 1    1     # Outcome Date GA Lbr Len/2nd Weight Sex Delivery Anes PTL Lv  2 SAB           1 PAR                ROS: A ROS was performed and pertinent positives and negatives are included in the history.  GENERAL: No fevers or chills. HEENT: No change in vision, no earache, sore throat or sinus congestion. NECK: No pain or stiffness. CARDIOVASCULAR: No chest pain or pressure. No palpitations. PULMONARY: No shortness of breath, cough or wheeze. GASTROINTESTINAL: No abdominal pain, nausea, vomiting or diarrhea, melena or bright red blood per rectum. GENITOURINARY: No urinary frequency, urgency, hesitancy or dysuria. MUSCULOSKELETAL: No joint or muscle pain, no back pain, no recent trauma. DERMATOLOGIC: No rash, no itching, no lesions. ENDOCRINE: No polyuria, polydipsia, no heat or cold intolerance. No recent change in weight. HEMATOLOGICAL: No anemia or easy bruising or bleeding. NEUROLOGIC: No headache, seizures, numbness, tingling or weakness. PSYCHIATRIC: No depression, no loss of interest in normal activity or change in sleep pattern.     Exam: chaperone present  BP 148/80  Ht 5' 1.25" (1.556 m)  Wt 150 lb 3.2 oz (68.13 kg)  BMI 28.14 kg/m2  LMP 04/17/1988  Body mass index is 28.14 kg/(m^2).  General appearance : Well developed well nourished female.  No acute distress HEENT: Neck supple, trachea midline, no carotid bruits, no thyroidmegaly Lungs: Clear to auscultation, no rhonchi or wheezes, or rib retractions  Heart: Regular rate and rhythm, no murmurs or gallops Breast:Examined in sitting and supine position were symmetrical in appearance, no palpable masses or tenderness,  no skin retraction, no nipple inversion, no nipple discharge, no skin discoloration, no axillary or supraclavicular lymphadenopathy Abdomen: no palpable masses  or tenderness, no rebound or guarding Extremities: no edema or skin discoloration or tenderness  Pelvic:  Bartholin, Urethra, Skene Glands: Within normal limits             Vagina: No gross lesions or discharge, atrophic changes  Cervix: No gross lesions or discharge  Uterus  axial, normal size, shape and consistency, non-tender and mobile  Adnexa  Without masses or tenderness  Anus and perineum  normal   Rectovaginal  normal sphincter tone without palpated masses or tenderness             Hemoccult PCP provides     Assessment/Plan:  80 y.o. female with normal GYN exam. Patient no longer using vaginal estrogen for her atrophy C. she is not sexually active and is asymptomatic. She is scheduled for her cardiac stress test next week as part of her evaluation for her palpitations. We discussed the importance of calcium and vitamin D for osteoporosis prevention and to continue to engage in weight bearing exercises 3-4 times a week as well. Her bone density study and mammogram are up-to-date. She is due for followup five-year interval colonoscopy as a result of her past history of colon polyp and most female members of her family have died from colon cancer. She was reminded to do her monthly breast exam as well.  Note: This dictation was prepared with  Dragon/digital dictation along withSmart phrase technology. Any transcriptional errors that result from this process are unintentional.   Ok Edwards MD, 2:44 PM 06/06/2013

## 2013-06-09 ENCOUNTER — Ambulatory Visit (INDEPENDENT_AMBULATORY_CARE_PROVIDER_SITE_OTHER): Payer: Medicare Other | Admitting: Physician Assistant

## 2013-06-09 DIAGNOSIS — R079 Chest pain, unspecified: Secondary | ICD-10-CM

## 2013-06-09 NOTE — Progress Notes (Signed)
Exercise Treadmill Test  Patricia Moore is a 80 y.o. female with a hx of DM, HTN, HL and FHx of CAD referred by PCP for ETT.  She has a hx of recent R scapular back pain with radiation to her neck.  She also notes exertional dyspnea.  No syncope.  Exam unremarkable. ECG with NSR and no ST changes.   Pre-Exercise Testing Evaluation Rhythm: normal sinus  Rate: 76 bpm     Test  Exercise Tolerance Test Ordering MD: Fransico Him, MD  Interpreting MD: Richardson Dopp, PA-C  Unique Test No: 1  Treadmill:  1  Indication for ETT: chest pain - rule out ischemia  Contraindication to ETT: No   Stress Modality: exercise - treadmill  Cardiac Imaging Performed: non   Protocol: standard Bruce - maximal  Max BP:  192/71  Max MPHR (bpm):  143 85% MPR (bpm):  122  MPHR obtained (bpm):  148 % MPHR obtained:  103  Reached 85% MPHR (min:sec):  5:56 Total Exercise Time (min-sec):  8:00  Workload in METS:  14.0 Borg Scale: 16  Reason ETT Terminated:  desired heart rate attained    ST Segment Analysis At Rest: normal ST segments - no evidence of significant ST depression With Exercise: non-specific ST changes  Other Information Arrhythmia:  No Angina during ETT:  absent (0) Quality of ETT:  diagnostic  ETT Interpretation:  normal - no evidence of ischemia by ST analysis  Comments: Excellent exercise capacity. No chest pain. Normal BP response to exercise. No significant ST changes to suggest ischemia.   Recommendations: F/u with Georgetta Haber, MD as directed. Signed,  Richardson Dopp, PA-C   06/09/2013 11:20 AM

## 2013-06-16 ENCOUNTER — Other Ambulatory Visit: Payer: Self-pay | Admitting: Internal Medicine

## 2013-06-18 ENCOUNTER — Telehealth: Payer: Self-pay

## 2013-06-18 NOTE — Telephone Encounter (Signed)
Please call her with results, her computer is broke at this time so she is not getting my chart [phone]

## 2013-06-19 NOTE — Telephone Encounter (Signed)
Per Dr Arnoldo Morale diabetes not well controlled.  Eat low carb diet

## 2013-06-19 NOTE — Telephone Encounter (Signed)
Pt aware of lab results however, she asked about stress test

## 2013-07-11 ENCOUNTER — Other Ambulatory Visit: Payer: Self-pay | Admitting: Internal Medicine

## 2013-08-18 ENCOUNTER — Other Ambulatory Visit: Payer: Self-pay | Admitting: Internal Medicine

## 2013-08-18 ENCOUNTER — Encounter: Payer: Self-pay | Admitting: Internal Medicine

## 2013-08-21 ENCOUNTER — Encounter: Payer: Medicare Other | Admitting: Internal Medicine

## 2013-09-12 ENCOUNTER — Telehealth: Payer: Self-pay | Admitting: Internal Medicine

## 2013-09-12 NOTE — Telephone Encounter (Signed)
Pt is needing new rx ciprofloxacin (CIPRO) 500 MG tablet, send to [facility] on [address]

## 2013-09-15 ENCOUNTER — Ambulatory Visit (INDEPENDENT_AMBULATORY_CARE_PROVIDER_SITE_OTHER): Payer: Medicare Other | Admitting: Family Medicine

## 2013-09-15 ENCOUNTER — Encounter: Payer: Self-pay | Admitting: Family Medicine

## 2013-09-15 VITALS — BP 124/70 | HR 83 | Temp 99.0°F | Ht 61.25 in | Wt 150.0 lb

## 2013-09-15 DIAGNOSIS — K5732 Diverticulitis of large intestine without perforation or abscess without bleeding: Secondary | ICD-10-CM

## 2013-09-15 DIAGNOSIS — R1032 Left lower quadrant pain: Secondary | ICD-10-CM

## 2013-09-15 DIAGNOSIS — K5792 Diverticulitis of intestine, part unspecified, without perforation or abscess without bleeding: Secondary | ICD-10-CM

## 2013-09-15 MED ORDER — METRONIDAZOLE 500 MG PO TABS: 500.0000 mg | ORAL_TABLET | Freq: Three times a day (TID) | ORAL | Status: DC

## 2013-09-15 MED ORDER — CIPROFLOXACIN HCL 500 MG PO TABS: 500.0000 mg | ORAL_TABLET | Freq: Two times a day (BID) | ORAL | Status: DC

## 2013-09-15 NOTE — Progress Notes (Signed)
No chief complaint on file.   HPI:  Acute visit for:  Diverticulitis: -hx several episodes in the past tx with abx outpt and responded well -reports this feels the same and started 4 days ago - persistent mild LLQ pain, maybe low grade temp yesterday -denies: vomiting, fevers, diarrhea, blood in stools, constipation, urinary or vaginal symptoms -seeing Dr. Henrene Pastor in July -reports called her pcp whom told her sending rx for abx- but did not send  ROS: See pertinent positives and negatives per HPI.  Past Medical History  Diagnosis Date  . Allergy   . Hypertension   . Osteoporosis   . Diabetes mellitus   . Back pain   . Arthritis   . Hx of adenomatous colonic polyps   . Diverticulosis   . Hyperlipidemia   . H. pylori infection 2001  . Intraductal papilloma   . 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 Delman Cheadle    Past Surgical History  Procedure Laterality Date  . Breast biopsy  '91, '95    INTRADUCTAL PAPILLOMA  . Breast lumpectomy    . Esophagogastroduodenoscopy    . Colonoscopy  2001    ADENOMATOUS COLONIC  POLYP    Family History  Problem Relation Age of Onset  . Adopted: Yes  . Hypertension    . Diabetes    . Heart disease Mother   . Heart disease Father   . Breast cancer Maternal Aunt   . Stroke Maternal Aunt   . Colon cancer Paternal Uncle   . Cancer Maternal Grandfather     COLON,, FAMILY HX 7 MEMBERS  . Breast cancer Maternal Aunt     History   Social History  . Marital Status: Married    Spouse Name: N/A    Number of Children: N/A  . Years of Education: N/A   Social History Main Topics  . Smoking status: Never Smoker   . Smokeless tobacco: Never Used  . Alcohol Use: None     Comment: OCCASIONALLY  . Drug Use: No  . Sexual Activity: No   Other Topics Concern  . None   Social History Narrative  . None    Current outpatient prescriptions:ALPRAZolam (XANAX) 0.25 MG tablet, TAKE  ONE TABLET BY MOUTH THREE TIMES DAILY AS NEEDED, Disp: 30 tablet, Rfl: 5;  baclofen (LIORESAL) 10 MG tablet, Take 1 tablet (10 mg total) by mouth 3 (three) times daily., Disp: 30 each, Rfl: 0;  Canagliflozin-Metformin HCl 150-500 MG TABS, Take 1 tablet by mouth daily., Disp: 60 tablet, Rfl: 11;  Lancets (ONETOUCH ULTRASOFT) lancets, USE AS DIRECTED, Disp: 300 each, Rfl: 6 NONFORMULARY OR COMPOUNDED ITEM, Estradiol .02% 1 ML Prefilled Applicator Sig: apply vaginally twice a week #90 Day Supply with 4 refills, Disp: 1 each, Rfl: 4;  olmesartan (BENICAR) 40 MG tablet, Take 40 mg by mouth daily.  , Disp: , Rfl: ;  ONE TOUCH ULTRA TEST test strip, USE ONE STRIP TO CHECK GLUCOSE 4 TIMES DAILY, Disp: 100 each, Rfl: 1 Vitamin D, Ergocalciferol, (DRISDOL) 50000 UNITS CAPS capsule, TAKE ONE CAPSULE BY MOUTH EVERY 7 DAYS, Disp: 5 capsule, Rfl: 3;  ciprofloxacin (CIPRO) 500 MG tablet, Take 1 tablet (500 mg total) by mouth 2 (two) times daily., Disp: 20 tablet, Rfl: 0;  metroNIDAZOLE (FLAGYL) 500 MG tablet, Take 1 tablet (500 mg total) by mouth 3 (three) times daily., Disp: 21 tablet, Rfl: 0  EXAM:  Filed Vitals:   09/15/13  1400  BP: 124/70  Pulse: 83  Temp: 99 F (37.2 C)    Body mass index is 28.1 kg/(m^2).  GENERAL: vitals reviewed and listed above, alert, oriented, appears well hydrated and in no acute distress  HEENT: atraumatic, conjunttiva clear, no obvious abnormalities on inspection of external nose and ears  NECK: no obvious masses on inspection  LUNGS: clear to auscultation bilaterally, no wheezes, rales or rhonchi, good air movement  CV: HRRR, no peripheral edema  ABD: BS+, soft, mild LLQ TTP, no rebound or guarding  MS: moves all extremities without noticeable abnormality  PSYCH: pleasant and cooperative, no obvious depression or anxiety  ASSESSMENT AND PLAN:  Discussed the following assessment and plan:  LLQ pain - Plan: ciprofloxacin (CIPRO) 500 MG tablet, metroNIDAZOLE (FLAGYL)  500 MG tablet  Diverticulitis - Plan: ciprofloxacin (CIPRO) 500 MG tablet, metroNIDAZOLE (FLAGYL) 500 MG tablet  -we discussed possible serious and likely etiologies, workup and treatment, treatment risks and return precautions -after this discussion, Patricia Moore opted for empiric cipro and flagyl after discussion risks, follow up with GI if persists or worsens, ED precautions -of course, we advised Patricia Moore  to return or notify a doctor immediately if symptoms worsen or persist or new concerns arise.  -Patient advised to return or notify a doctor immediately if symptoms worsen or persist or new concerns arise.  Patient Instructions  -As we discussed, we have prescribed a new medication (ciprofloxacin and flagyl) for you at this appointment. We discussed the common and serious potential adverse effects of this medication and you can review these and more with the pharmacist when you pick up your medication.  Please follow the instructions for use carefully and notify us immediately if you have any problems taking this medication.  -liquid diet until improving  -follow up with your gastroenterologist if not improving or recurs  -see a doctor immediately if worsens      Patricia Moore

## 2013-09-15 NOTE — Telephone Encounter (Signed)
Called and spoke with pt and she has an upcoming appt today with Dr. Maudie Mercury. Pt thinks she is having a diverticulitis attack.

## 2013-09-15 NOTE — Progress Notes (Signed)
Pre visit review using our clinic review tool, if applicable. No additional management support is needed unless otherwise documented below in the visit note. 

## 2013-09-15 NOTE — Patient Instructions (Signed)
-  As we discussed, we have prescribed a new medication (ciprofloxacin and flagyl) for you at this appointment. We discussed the common and serious potential adverse effects of this medication and you can review these and more with the pharmacist when you pick up your medication.  Please follow the instructions for use carefully and notify us immediately if you have any problems taking this medication.  -liquid diet until improving  -follow up with your gastroenterologist if not improving or recurs  -see a doctor immediately if worsens

## 2013-09-16 ENCOUNTER — Telehealth: Payer: Self-pay | Admitting: Internal Medicine

## 2013-09-16 NOTE — Telephone Encounter (Signed)
Pt was seen by her PCP yesterday for diverticulitis and placed on cipro and flagyl. Pt is calling because she has not had a BM in several days. Pt instructed to take miralax, she will call back tomorrow if this does not work. Pt verbalized understanding.

## 2013-10-02 ENCOUNTER — Ambulatory Visit (AMBULATORY_SURGERY_CENTER): Payer: Self-pay

## 2013-10-02 VITALS — Ht 61.0 in | Wt 150.0 lb

## 2013-10-02 DIAGNOSIS — Z860100 Personal history of colon polyps, unspecified: Secondary | ICD-10-CM

## 2013-10-02 MED ORDER — MOVIPREP 100 G PO SOLR: 1.0000 | Freq: Once | ORAL | Status: DC

## 2013-10-02 NOTE — Progress Notes (Signed)
No allergies to eggs or soy No past problems with anesthesia No home oxygen No diet/weight loss meds  Has email  Emmi instructions given for colonoscopy 

## 2013-10-05 ENCOUNTER — Other Ambulatory Visit: Payer: Self-pay | Admitting: Internal Medicine

## 2013-10-13 ENCOUNTER — Encounter: Payer: Self-pay | Admitting: Internal Medicine

## 2013-10-16 ENCOUNTER — Ambulatory Visit (AMBULATORY_SURGERY_CENTER): Payer: Medicare Other | Admitting: Internal Medicine

## 2013-10-16 ENCOUNTER — Encounter: Payer: Self-pay | Admitting: Internal Medicine

## 2013-10-16 VITALS — BP 116/73 | HR 61 | Temp 96.8°F | Resp 24 | Ht 61.0 in | Wt 150.0 lb

## 2013-10-16 DIAGNOSIS — Z860100 Personal history of colon polyps, unspecified: Secondary | ICD-10-CM

## 2013-10-16 DIAGNOSIS — D126 Benign neoplasm of colon, unspecified: Secondary | ICD-10-CM

## 2013-10-16 MED ORDER — SODIUM CHLORIDE 0.9 % IV SOLN: 500.0000 mL | INTRAVENOUS | Status: DC

## 2013-10-16 NOTE — Patient Instructions (Signed)
YOU HAD AN ENDOSCOPIC PROCEDURE TODAY AT THE [facility]: Refer to the procedure report that was given to you for any specific questions about what was found during the examination.  If the procedure report does not answer your questions, please call your gastroenterologist to clarify.  If you requested that your care partner not be given the details of your procedure findings, then the procedure report has been included in a sealed envelope for you to review at your convenience later.  YOU SHOULD EXPECT: Some feelings of bloating in the abdomen. Passage of more gas than usual.  Walking can help get rid of the air that was put into your GI tract during the procedure and reduce the bloating. If you had a lower endoscopy (such as a colonoscopy or flexible sigmoidoscopy) you may notice spotting of blood in your stool or on the toilet paper. If you underwent a bowel prep for your procedure, then you may not have a normal bowel movement for a few days.  DIET: Your first meal following the procedure should be a light meal and then it is ok to progress to your normal diet.  A half-sandwich or bowl of soup is an example of a good first meal.  Heavy or fried foods are harder to digest and may make you feel nauseous or bloated.  Likewise meals heavy in dairy and vegetables can cause extra gas to form and this can also increase the bloating.  Drink plenty of fluids but you should avoid alcoholic beverages for 24 hours.  ACTIVITY: Your care partner should take you home directly after the procedure.  You should plan to take it easy, moving slowly for the rest of the day.  You can resume normal activity the day after the procedure however you should NOT DRIVE or use heavy machinery for 24 hours (because of the sedation medicines used during the test).    SYMPTOMS TO REPORT IMMEDIATELY: A gastroenterologist can be reached at any hour.  During normal business hours, 8:30 AM to 5:00 PM Monday through Friday,  call [phone].  After hours and on weekends, please call the GI answering service at [phone] who will take a message and have the physician on call contact you.   Following lower endoscopy (colonoscopy or flexible sigmoidoscopy):  Excessive amounts of blood in the stool  Significant tenderness or worsening of abdominal pains  Swelling of the abdomen that is new, acute  Fever of 100F or higher    FOLLOW UP: If any biopsies were taken you will be contacted by phone or by letter within the next 1-3 weeks.  Call your gastroenterologist if you have not heard about the biopsies in 3 weeks.  Our staff will call the home number listed on your records the next business day following your procedure to check on you and address any questions or concerns that you may have at that time regarding the information given to you following your procedure. This is a courtesy call and so if there is no answer at the home number and we have not heard from you through the emergency physician on call, we will assume that you have returned to your regular daily activities without incident.  SIGNATURES/CONFIDENTIALITY: You and/or your care partner have signed paperwork which will be entered into your electronic medical record.  These signatures attest to the fact that that the information above on your After Visit Summary has been reviewed and is understood.  Full responsibility of the confidentiality   this discharge information lies with you and/or your care-partner.  INFORMATION ON POLYPS AND DIVERTICULOSIS GIVEN TO YOU TODAY 

## 2013-10-16 NOTE — Op Note (Signed)
[place]  [address]. [place], [address]   COLONOSCOPY PROCEDURE REPORT  PATIENT: Patricia Moore, Patricia Moore  MR#: [id number] BIRTHDATE: 1933-08-30 , 79  yrs. old GENDER: Female ENDOSCOPIST: Eustace Quail, MD REFERRED WS:[id number] Program Recall PROCEDURE DATE:  10/16/2013 PROCEDURE:   Colonoscopy with snare polypectomy x 3 First Screening Colonoscopy - Avg.  risk and is 50 yrs.  old or older - No.  Prior Negative Screening - Now for repeat screening. N/A  History of Adenoma - Now for follow-up colonoscopy & has been > or = to 3 yrs.  Yes hx of adenoma.  Has been 3 or more years since last colonoscopy.  Polyps Removed Today? Yes. ASA CLASS:   Class II INDICATIONS:Patient's personal history of adenomatous colon polyps. Multiple prior exams. Last 2010 (small TA) MEDICATIONS: MAC sedation, administered by CRNA and propofol (Diprivan) 250mg  IV DESCRIPTION OF PROCEDURE:   After the risks benefits and alternatives of the procedure were thoroughly explained, informed consent was obtained.  A digital rectal exam revealed no abnormalities of the rectum.   The LB [id number]  endoscope was introduced through the anus and advanced to the cecum, which was identified by both the appendix and ileocecal valve. No adverse events experienced.   The quality of the prep was good, using MoviPrep  The instrument was then slowly withdrawn as the colon was fully examined.  COLON FINDINGS: Three diminutive polyps were found in the ascending and transverse colon.  A polypectomy was performed with a cold snare.  The resection was complete and the polyp tissue was completely retrieved.   Melanosis was found.   Moderate diverticulosis was noted  in the left colon.   The colon mucosa was otherwise normal.  Retroflexed views revealed no abnormalities. The time to cecum=3 min 24 seconds.  Withdrawal time=9 minutes 32 seconds.  The scope was withdrawn and the procedure  completed. COMPLICATIONS: There were no complications.  ENDOSCOPIC IMPRESSION: 1.   Three diminutive polyps were found in the colon; polypectomy was performed with a cold snare 2.   Melanosis coli 3.   Moderate diverticulosis was noted in the left colon 4.   The colon mucosa was otherwise normal  RECOMMENDATIONS: Return to the care of your primary provider.  GI follow up as needed   eSigned:  Eustace Quail, MD 10/16/2013 10:05 AM   cc: Ricard Dillon, MD and The Patient

## 2013-10-16 NOTE — Progress Notes (Signed)
Called to room to assist during endoscopic procedure.  Patient ID and intended procedure confirmed with present staff. Received instructions for my participation in the procedure from the performing physician.  

## 2013-10-16 NOTE — Progress Notes (Signed)
A/ox3, pleased with MAC, report to RN 

## 2013-10-20 ENCOUNTER — Other Ambulatory Visit: Payer: Self-pay | Admitting: Internal Medicine

## 2013-10-20 ENCOUNTER — Ambulatory Visit: Payer: Medicare Other | Admitting: Internal Medicine

## 2013-10-20 ENCOUNTER — Telehealth: Payer: Self-pay | Admitting: *Deleted

## 2013-10-20 NOTE — Telephone Encounter (Signed)
  Follow up Call-  Call back number 10/16/2013  Post procedure Call Back phone  # [phone]  Permission to leave phone message Yes     Patient questions:  Do you have a fever, pain , or abdominal swelling? No. Pain Score  0 *  Have you tolerated food without any problems? Yes.    Have you been able to return to your normal activities? Yes.    Do you have any questions about your discharge instructions: Diet   No. Medications  No. Follow up visit  No.  Do you have questions or concerns about your Care? No.  Actions: * If pain score is 4 or above: No action needed, pain <4.

## 2013-10-21 ENCOUNTER — Telehealth: Payer: Self-pay | Admitting: Internal Medicine

## 2013-10-21 DIAGNOSIS — E119 Type 2 diabetes mellitus without complications: Secondary | ICD-10-CM

## 2013-10-21 MED ORDER — GLUCOSE BLOOD VI STRP: ORAL_STRIP | Status: DC

## 2013-10-21 NOTE — Telephone Encounter (Signed)
[facility] [place], [place] - [address] Is requesting re-fill on ONE TOUCH ULTRA TEST test strip

## 2013-10-21 NOTE — Telephone Encounter (Signed)
Rx sent to pharmacy   

## 2013-10-22 ENCOUNTER — Other Ambulatory Visit (HOSPITAL_COMMUNITY): Payer: Self-pay | Admitting: Cardiology

## 2013-10-22 DIAGNOSIS — I6529 Occlusion and stenosis of unspecified carotid artery: Secondary | ICD-10-CM

## 2013-10-24 ENCOUNTER — Ambulatory Visit (HOSPITAL_COMMUNITY): Payer: Medicare Other | Attending: Internal Medicine | Admitting: Cardiology

## 2013-10-24 DIAGNOSIS — I10 Essential (primary) hypertension: Secondary | ICD-10-CM | POA: Insufficient documentation

## 2013-10-24 DIAGNOSIS — E785 Hyperlipidemia, unspecified: Secondary | ICD-10-CM | POA: Insufficient documentation

## 2013-10-24 DIAGNOSIS — E119 Type 2 diabetes mellitus without complications: Secondary | ICD-10-CM | POA: Insufficient documentation

## 2013-10-24 DIAGNOSIS — I6529 Occlusion and stenosis of unspecified carotid artery: Secondary | ICD-10-CM

## 2013-10-24 NOTE — Progress Notes (Signed)
Carotid duplex performed 

## 2013-10-27 ENCOUNTER — Telehealth: Payer: Self-pay | Admitting: Internal Medicine

## 2013-10-27 ENCOUNTER — Encounter: Payer: Self-pay | Admitting: Internal Medicine

## 2013-10-27 NOTE — Telephone Encounter (Signed)
[facility] [place], [place] - [address] Is requesting re-fill on Lancets (ONETOUCH ULTRASOFT) lancets

## 2013-10-29 MED ORDER — ONETOUCH ULTRASOFT LANCETS MISC: 1.0000 | Freq: Every day | Status: DC

## 2013-10-29 NOTE — Telephone Encounter (Signed)
rx sent in electronically 

## 2013-11-06 ENCOUNTER — Encounter: Payer: Self-pay | Admitting: Family Medicine

## 2013-11-06 ENCOUNTER — Ambulatory Visit (INDEPENDENT_AMBULATORY_CARE_PROVIDER_SITE_OTHER): Payer: Medicare Other | Admitting: Family Medicine

## 2013-11-06 VITALS — BP 148/70 | HR 80 | Temp 98.3°F | Wt 154.0 lb

## 2013-11-06 DIAGNOSIS — T148XXA Other injury of unspecified body region, initial encounter: Secondary | ICD-10-CM

## 2013-11-06 NOTE — Patient Instructions (Signed)
Blood blister on your heel  Likely from your walking (I am thrilled you walk so much though!)  Try ice for pain or a little heat  Can use neosporin if you would like, make sure to keep foot well moisturized  Take it easy for now, hold off on 3-4 mile walking until pain better. Can do 1/2 a mile or so daily (give yourself at least 3 days break though)  Pain should not worsen so see [place] if this happens  This likely will take 2-3 weeks to heal or longer, but if you are concerned you can check back in with [place]   See [place] back to talk about your diabetes in a month or so

## 2013-11-06 NOTE — Progress Notes (Signed)
  Garret Reddish, MD Phone: [phone]  Subjective:   Patricia Moore is a 80 y.o. year old very pleasant female patient who presents with the following:  Heel Pain Walks 3-4 miles a day. On Tuesday, noted some mild heel pain while walking but continued to push through. After the walk, she noted some tenderness in heel and next morning noted a small bluish brown streak. No pain with resting. Pain only with putting pressure on area. Rated as mild to moderate ache though does have some burning in bilateral feet which she attributes to diabetes (longstanding issue). Area has not grown in size. Has tried no treatment. Tennis shoes do not improve pain, instead other shoes that do not rub on area help pain.  ROS- no expanding redness. No fever or chills.   Past Medical History- moderately well controlled DM, HTN, seasonal allergies, OA, osteoporosis  Medications- reviewed and updated Current Outpatient Prescriptions  Medication Sig Dispense Refill  . ALPRAZolam (XANAX) 0.25 MG tablet TAKE ONE TABLET BY MOUTH THREE TIMES DAILY AS NEEDED  30 tablet  5  . glucose blood test strip Test daily.  100 each  12  . glyBURIDE (DIABETA) 2.5 MG tablet TAKE ONE TABLET BY MOUTH TWICE DAILY WITH A MEAL  180 tablet  0  . Lancets (ONETOUCH ULTRASOFT) lancets 1 each by Other route daily. Use as instructed  300 each  6  . metFORMIN (GLUCOPHAGE) 500 MG tablet Take 500 mg by mouth 2 (two) times daily with a meal.       . olmesartan (BENICAR) 40 MG tablet Take 40 mg by mouth daily.        . ONE TOUCH ULTRA TEST test strip USE ONE STRIP TO CHECK GLUCOSE 4 TIMES DAILY  100 each  0  . Vitamin D, Ergocalciferol, (DRISDOL) 50000 UNITS CAPS capsule TAKE ONE CAPSULE BY MOUTH EVERY 7 DAYS  5 capsule  3  . Canagliflozin-Metformin HCl 150-500 MG TABS Take 1 tablet by mouth daily.  60 tablet  11   No current facility-administered medications for this visit.    Objective: BP 148/70  Pulse 80  Temp(Src) 98.3 F (36.8 C)  (Oral)  Wt 154 lb (69.854 kg)  LMP 04/17/1988 Gen: NAD, resting comfortably MSK: 2-3 cm by 0.5 cm bluish-black area on outer right back heel. Tenderness to palpation over area but none in surrounding area including close to area but not directly touching.   Assessment/Plan:  Blood blister on right heel Believe this is due to trauma from walking. Patient is diabetic so high risk. Do not think this represents a stress fracture despite osteoporosis. Do not believe blister is deep seeded. Patient very anxious about area and states if not improving will return in 1 week. Offered reassurance. Symptomatic care per avs and return instructions given.

## 2013-11-06 NOTE — Progress Notes (Signed)
Pre visit review using our clinic review tool, if applicable. No additional management support is needed unless otherwise documented below in the visit note. 

## 2013-12-05 ENCOUNTER — Telehealth: Payer: Self-pay | Admitting: Internal Medicine

## 2013-12-05 MED ORDER — GLYBURIDE 2.5 MG PO TABS: ORAL_TABLET | ORAL | Status: DC

## 2013-12-05 MED ORDER — METFORMIN HCL 500 MG PO TABS: 500.0000 mg | ORAL_TABLET | Freq: Two times a day (BID) | ORAL | Status: DC

## 2013-12-05 NOTE — Telephone Encounter (Signed)
rx sent in electronically 

## 2013-12-05 NOTE — Telephone Encounter (Signed)
Pt is out of town at [employer] and forgot her diabetes medicine at home. Son is wanting to know if 6 pills can be called in for her to get her through while she is on vacation. Pt needs rx metFORMIN (GLUCOPHAGE) 500 MG tablet [facility]- tel # [phone], fax# [phone].

## 2013-12-05 NOTE — Telephone Encounter (Signed)
[facility] - [place] is requesting re-fill on glyBURIDE (DIABETA) 2.5 MG tablet

## 2013-12-09 ENCOUNTER — Encounter: Payer: Self-pay | Admitting: Internal Medicine

## 2013-12-09 ENCOUNTER — Ambulatory Visit (INDEPENDENT_AMBULATORY_CARE_PROVIDER_SITE_OTHER): Payer: Medicare Other | Admitting: Internal Medicine

## 2013-12-09 VITALS — BP 128/58 | HR 72 | Temp 98.8°F | Ht 64.0 in | Wt 151.4 lb

## 2013-12-09 DIAGNOSIS — E559 Vitamin D deficiency, unspecified: Secondary | ICD-10-CM

## 2013-12-09 DIAGNOSIS — F411 Generalized anxiety disorder: Secondary | ICD-10-CM

## 2013-12-09 DIAGNOSIS — R748 Abnormal levels of other serum enzymes: Secondary | ICD-10-CM

## 2013-12-09 DIAGNOSIS — R7989 Other specified abnormal findings of blood chemistry: Secondary | ICD-10-CM

## 2013-12-09 DIAGNOSIS — R6889 Other general symptoms and signs: Secondary | ICD-10-CM

## 2013-12-09 DIAGNOSIS — F419 Anxiety disorder, unspecified: Secondary | ICD-10-CM

## 2013-12-09 DIAGNOSIS — E119 Type 2 diabetes mellitus without complications: Secondary | ICD-10-CM

## 2013-12-09 DIAGNOSIS — I10 Essential (primary) hypertension: Secondary | ICD-10-CM

## 2013-12-09 HISTORY — DX: Anxiety disorder, unspecified: F41.9

## 2013-12-09 MED ORDER — ALPRAZOLAM 0.25 MG PO TABS: ORAL_TABLET | ORAL | Status: DC

## 2013-12-09 MED ORDER — METFORMIN HCL 500 MG PO TABS: 500.0000 mg | ORAL_TABLET | Freq: Two times a day (BID) | ORAL | Status: DC

## 2013-12-09 MED ORDER — GLYBURIDE 2.5 MG PO TABS: ORAL_TABLET | ORAL | Status: DC

## 2013-12-09 MED ORDER — ALPRAZOLAM 0.25 MG PO TABS
ORAL_TABLET | ORAL | Status: DC
Start: 2013-12-09 — End: 2013-12-09

## 2013-12-09 NOTE — Assessment & Plan Note (Signed)
History of hypertension, currently takes Benicar only once a week, is working on a low-salt diet. At this point, we will discontinue Benicar and continue monitoring BPs.

## 2013-12-09 NOTE — Progress Notes (Signed)
Subjective:    Patient ID: Patricia Moore, female    DOB: 11/03/1933, 80 y.o.   MRN: [id number]  DOS:  12/09/2013 Type of visit - description: new pt, transfer from Dr Lovell Sheehan History: History of diabetes, good medication compliance, at some point  was doing really well on Glucovance but medications were changed due to insurance constraints, since then blood sugars range from the 150s to 200s. Hypertension, supposedly on Benicar but she takes that once a week, states that she is managing her blood pressure with a low-salt diet. History of upper back pain, at some point felt to be cardiac, stress test was negative. History of anxiety, symptoms well-controlled with Xanax as needed   ROS Denies SS chest pain or difficulty breathing at this time No nausea, vomiting, diarrhea or blood in the stools  Past Medical History  Diagnosis Date  . Allergy   . Hypertension   . Osteoporosis   . Diabetes mellitus   . Back pain   . Arthritis   . Hx of adenomatous colonic polyps   . Diverticulosis   . Hyperlipidemia   . H. pylori infection 2001  . Intraductal papilloma   . 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 Emily Filbert  . Diverticulitis     patient states has had three attacks   . Anxiety 12/09/2013    Past Surgical History  Procedure Laterality Date  . Breast biopsy  '91, '95    INTRADUCTAL PAPILLOMA  . Breast lumpectomy    . Esophagogastroduodenoscopy    . Colonoscopy           History   Social History  . Marital Status: Married    Spouse Name: N/A    Number of Children: 1  . Years of Education: N/A   Occupational History  . retired     Social History Main Topics  . Smoking status: Never Smoker   . Smokeless tobacco: Never Used  . Alcohol Use: 0.6 oz/week    1 Glasses of wine per week     Comment: OCCASIONALLY  . Drug Use: No  . Sexual Activity: No   Other Topics Concern  . Not on file    Social History Narrative   Born in [place], ,moved to the [place] in the 60s, live in [place] then in [place]   Lives w/ husband        Medication List       This list is accurate as of: 12/09/13 11:59 PM.  Always use your most recent med list.               ALPRAZolam 0.25 MG tablet  Commonly known as:  XANAX  TAKE ONE TABLET BY MOUTH THREE TIMES DAILY AS NEEDED     cholecalciferol 1000 UNITS tablet  Commonly known as:  VITAMIN D  Take 1,000 Units by mouth daily.     glyBURIDE 2.5 MG tablet  Commonly known as:  DIABETA  TAKE ONE TABLET BY MOUTH TWICE DAILY WITH A MEAL     metFORMIN 500 MG tablet  Commonly known as:  GLUCOPHAGE  Take 1 tablet (500 mg total) by mouth 2 (two) times daily with a meal.     ONE TOUCH ULTRA TEST test strip  Generic drug:  glucose blood  USE ONE STRIP TO CHECK GLUCOSE 4 TIMES DAILY     glucose blood test strip  Test daily.  onetouch ultrasoft lancets  1 each by Other route daily. Use as instructed           Objective:   Physical Exam BP 128/58  Pulse 72  Temp(Src) 98.8 F (37.1 C) (Oral)  Ht 5\' 4"  (1.626 m)  Wt 151 lb 6 oz (68.663 kg)  BMI 25.97 kg/m2  SpO2 98%  LMP 04/17/1988 General -- alert, well-developed, NAD.  Neck --no thyromegaly , normal carotid pulse  HEENT-- Not pale.  Lungs -- normal respiratory effort, no intercostal retractions, no accessory muscle use, and normal breath sounds.  Heart-- normal rate, regular rhythm, no murmur.  Abdomen-- Not distended, good bowel sounds,soft, non-tender.  Extremities-- no pretibial edema bilaterally  Neurologic--  alert & oriented X3. Speech normal, gait appropriate for age, strength symmetric and appropriate for age.  Psych-- Cognition and judgment appear intact. Cooperative with normal attention span and concentration. No anxious or depressed appearing.     Assessment & Plan:

## 2013-12-09 NOTE — Assessment & Plan Note (Signed)
Controlled w/ xanax prn

## 2013-12-09 NOTE — Progress Notes (Signed)
Pre-visit discussion using our clinic review tool. No additional management support is needed unless otherwise documented below in the visit note.  

## 2013-12-09 NOTE — Assessment & Plan Note (Signed)
Good compliance with her medications, will check labs --A1c, FLP, microalbumin  Reports CBGs are in the 150-160

## 2013-12-09 NOTE — Patient Instructions (Addendum)
Stop Benicar Stop ergocalciferol Start OTC vitamin D 1000 units one tablet daily  Come back fasting for blood work: BMP -- dx  hypertension FLP, A1c, microalbumin, AST, ALT, microalbumin  --- dx  diabetes CRP, homocystine level --- dx abnormal homocystine  Check the  blood pressure weekly be sure it is between 110/60 and 140/85. Ideal blood pressure is 120/80. If it is consistently higher or lower, let me know  Next visit is for routine check up in 3 months  No need to come back fasting Please make an appointment

## 2013-12-09 NOTE — Assessment & Plan Note (Signed)
On ergocalciferol weekly x a while rec d/c ergocalciferol, take vit D OTC qd

## 2013-12-10 ENCOUNTER — Other Ambulatory Visit (INDEPENDENT_AMBULATORY_CARE_PROVIDER_SITE_OTHER): Payer: Medicare Other

## 2013-12-10 ENCOUNTER — Telehealth: Payer: Self-pay | Admitting: Internal Medicine

## 2013-12-10 DIAGNOSIS — E119 Type 2 diabetes mellitus without complications: Secondary | ICD-10-CM

## 2013-12-10 DIAGNOSIS — R7989 Other specified abnormal findings of blood chemistry: Secondary | ICD-10-CM | POA: Insufficient documentation

## 2013-12-10 DIAGNOSIS — I10 Essential (primary) hypertension: Secondary | ICD-10-CM

## 2013-12-10 DIAGNOSIS — R748 Abnormal levels of other serum enzymes: Secondary | ICD-10-CM

## 2013-12-10 LAB — HEMOGLOBIN A1C: Hgb A1c MFr Bld: 7.1 % — ABNORMAL HIGH (ref 4.6–6.5)

## 2013-12-10 LAB — BASIC METABOLIC PANEL
BUN: 20 mg/dL (ref 6–23)
CO2: 28 mEq/L (ref 19–32)
Calcium: 9.8 mg/dL (ref 8.4–10.5)
Chloride: 101 mEq/L (ref 96–112)
Creatinine, Ser: 1 mg/dL (ref 0.4–1.2)
GFR: 57.67 mL/min — ABNORMAL LOW (ref >60.00)
Glucose, Bld: 139 mg/dL — ABNORMAL HIGH (ref 70–99)
Potassium: 4.5 mEq/L (ref 3.5–5.1)
Sodium: 137 mEq/L (ref 135–145)

## 2013-12-10 LAB — MICROALBUMIN / CREATININE URINE RATIO
Creatinine,U: 62 mg/dL
Microalb Creat Ratio: 1.3 mg/g (ref 0.0–30.0)
Microalb, Ur: 0.8 mg/dL (ref 0.0–1.9)

## 2013-12-10 LAB — ALT: ALT: 24 U/L (ref 0–35)

## 2013-12-10 LAB — AST: AST: 27 U/L (ref 0–37)

## 2013-12-10 LAB — LIPID PANEL
Cholesterol: 183 mg/dL (ref 0–200)
HDL: 60 mg/dL (ref >39.00)
LDL Cholesterol: 94 mg/dL (ref 0–99)
NonHDL: 123
Total CHOL/HDL Ratio: 3
Triglycerides: 145 mg/dL (ref 0.0–149.0)
VLDL: 29 mg/dL (ref 0.0–40.0)

## 2013-12-10 LAB — C-REACTIVE PROTEIN: CRP: <0.5 mg/dL (ref 0.5–20.0)

## 2013-12-10 NOTE — Assessment & Plan Note (Signed)
Reports a history of elevated homocystine level, request labs

## 2013-12-10 NOTE — Telephone Encounter (Signed)
Relevant patient education assigned to patient using Emmi. ° °

## 2013-12-10 NOTE — Addendum Note (Signed)
Addended by: Modena Morrow D on: 12/10/2013 02:32 PM   Modules accepted: Orders

## 2013-12-16 LAB — MICROALBUMIN, URINE

## 2013-12-16 LAB — HOMOCYSTEINE: Homocysteine: 10.5 umol/L (ref 4.0–15.4)

## 2013-12-23 ENCOUNTER — Telehealth: Payer: Self-pay | Admitting: *Deleted

## 2013-12-23 DIAGNOSIS — E119 Type 2 diabetes mellitus without complications: Secondary | ICD-10-CM

## 2013-12-23 MED ORDER — GLUCOSE BLOOD VI STRP: ORAL_STRIP | Status: DC

## 2013-12-23 NOTE — Telephone Encounter (Signed)
Please see phone note. Do you know about a specific code that Pt can get diabetic test strips for free?  Please advise.

## 2013-12-23 NOTE — Telephone Encounter (Signed)
Strips sent to Pharmacy. Test only 2 times daily.

## 2013-12-23 NOTE — Telephone Encounter (Signed)
Pt came by office this morning.  She is requesting a refill on  ONE TOUCH ULTRA TEST test strip Last filled  10/21/2013, 100 each, 12 refills by Dr. Arnoldo Morale  Pt states she needs a prescription to give her enough to test 4 times a day.  The pharmacy told her to tell Dr. Larose Kells to use a specific code so she can get them for free.

## 2013-12-23 NOTE — Telephone Encounter (Signed)
Does not need to check 4 times a day, 2 times a day is enough Code 250.00

## 2014-01-10 ENCOUNTER — Encounter: Payer: Self-pay | Admitting: Family Medicine

## 2014-01-10 ENCOUNTER — Ambulatory Visit (INDEPENDENT_AMBULATORY_CARE_PROVIDER_SITE_OTHER): Payer: Medicare Other | Admitting: Family Medicine

## 2014-01-10 VITALS — BP 150/80 | Temp 98.7°F | Wt 150.0 lb

## 2014-01-10 DIAGNOSIS — J209 Acute bronchitis, unspecified: Secondary | ICD-10-CM

## 2014-01-10 MED ORDER — AZITHROMYCIN 250 MG PO TABS
ORAL_TABLET | ORAL | Status: DC
Start: 2014-01-10 — End: 2014-02-03

## 2014-01-10 NOTE — Patient Instructions (Addendum)
creo que tiene bronquitis - trate con zpack. [employer] liquido, mucho descanso. siga tylenol. Quiero que compre delsym para tos - puede usar de dia y de noche. si no mejora como esperado, llamenos y regrese para revisar de nuevo.

## 2014-01-10 NOTE — Assessment & Plan Note (Signed)
Given age and comorbidities, cover for atypicals with zpack. Delsym for cough. Update if not improving as expected. Pt agrees with plan.

## 2014-01-10 NOTE — Progress Notes (Signed)
BP 150/80  Temp(Src) 98.7 F (37.1 C)  Wt 150 lb (68.04 kg)  LMP 04/17/1988   CC: cough  Subjective:    Patient ID: Patricia Moore, female    DOB: August 12, 1933, 80 y.o.   MRN: [id number]  HPI: Patricia Moore is a 80 y.o. female presenting on 01/10/2014 for Cough   Spanish speaking, from [place].  Cough present for last 5 days. + low grade fever. + ST, body aches, abd muscular pain with cough, ear pain, headaches.  +PNDrainage. Worsening. Trouble sleeping at night time 2/2 cough.  Chest = head congestion.  Treating with tylenol 500mg . This morning took 1000mg  dose.  + sick contact - son visited 2 wks ago. No smokers at home. No h/o asthma.  bp elevated today but at home runs well controlled. Diabetic - stable. Lab Results  Component Value Date   HGBA1C 7.1* 12/10/2013     Relevant past medical, surgical, family and social history reviewed and updated as indicated.  Allergies and medications reviewed and updated. Current Outpatient Prescriptions on File Prior to Visit  Medication Sig  . ALPRAZolam (XANAX) 0.25 MG tablet TAKE ONE TABLET BY MOUTH THREE TIMES DAILY AS NEEDED  . cholecalciferol (VITAMIN D) 1000 UNITS tablet Take 1,000 Units by mouth daily.  Marland Kitchen glucose blood (ONE TOUCH ULTRA TEST) test strip USE ONE STRIP TO CHECK GLUCOSE 2 TIMES DAILY ONLY.  Marland Kitchen glyBURIDE (DIABETA) 2.5 MG tablet TAKE ONE TABLET BY MOUTH TWICE DAILY WITH A MEAL  . Lancets (ONETOUCH ULTRASOFT) lancets 1 each by Other route daily. Use as instructed  . metFORMIN (GLUCOPHAGE) 500 MG tablet Take 1 tablet (500 mg total) by mouth 2 (two) times daily with a meal.   No current facility-administered medications on file prior to visit.    Review of Systems Per HPI unless specifically indicated above    Objective:    BP 150/80  Temp(Src) 98.7 F (37.1 C)  Wt 150 lb (68.04 kg)  LMP 04/17/1988  Physical Exam  Nursing note and vitals reviewed. Constitutional: She appears well-developed and well-nourished. No  distress.  HENT:  Head: Normocephalic and atraumatic.  Right Ear: Hearing, tympanic membrane, external ear and ear canal normal.  Left Ear: Hearing, tympanic membrane, external ear and ear canal normal.  Nose: No mucosal edema or rhinorrhea. Right sinus exhibits frontal sinus tenderness. Right sinus exhibits no maxillary sinus tenderness. Left sinus exhibits no maxillary sinus tenderness and no frontal sinus tenderness.  Mouth/Throat: Uvula is midline and mucous membranes are normal. Posterior oropharyngeal edema and posterior oropharyngeal erythema present. No oropharyngeal exudate or tonsillar abscesses.  R sided oropharyngeal erythema/edema  Eyes: Conjunctivae and EOM are normal. Pupils are equal, round, and reactive to light. No scleral icterus.  Neck: Normal range of motion. Neck supple.  Cardiovascular: Normal rate, regular rhythm, normal heart sounds and intact distal pulses.   No murmur heard. Pulmonary/Chest: Effort normal and breath sounds normal. No respiratory distress. She has no wheezes. She has no rales.  Lymphadenopathy:    She has no cervical adenopathy.  Skin: Skin is warm and dry. No rash noted.       Assessment & Plan:   Problem List Items Addressed This Visit   Acute bronchitis - Primary     Given age and comorbidities, cover for atypicals with zpack. Delsym for cough. Update if not improving as expected. Pt agrees with plan.        Follow up plan: Return if symptoms worsen or fail to improve.

## 2014-01-27 ENCOUNTER — Telehealth: Payer: Self-pay | Admitting: Internal Medicine

## 2014-01-27 NOTE — Telephone Encounter (Signed)
Took call from CMA due to patient inappropriateness . Patient was yelling and refusing to follow recommendations. States that she is by herself and cannot get to the UC or ED because she is in pain. Patient stated that she was able to get to the pharmacy to pick Rx. Advised that she would need to go to ED or UC. Advised if unable to follow recommendations and is in that much pain to call 911. Patient refused recommendations. Patient continued to yell. Unable to console or give request, patient disconnected the call.

## 2014-01-27 NOTE — Telephone Encounter (Signed)
Please advise 

## 2014-01-27 NOTE — Telephone Encounter (Signed)
Spoke with Pt and informed her medication can not be sent without being seen. Dr. Larose Kells suggested going to local urgent care, Pt stated "Dr. Larose Kells is an awful human being", I apologized but explained to her that ax can't be sent unless seen. Gaye also informed her that she will need to also go to local urgent care if she is in pain.

## 2014-01-27 NOTE — Telephone Encounter (Signed)
Patient request treatment for diverticulitis over the phone, I won't be able to that. Please call pt--->  strongly recommend to go to a local urgent care and get checked.

## 2014-01-27 NOTE — Telephone Encounter (Signed)
Caller name: Sydell Relation to pt: self Call back number: [phone] Pharmacy: [address] [place], [place] [address] store [phone]  Reason for call:   Patient states that she has had a diverticulitis flare up and would like to know if Dr. Larose Kells would send in a prescription of cipro for her. She is on vacation and would like [facility] [place]. Patient is at pharmacy now and would like this called in asap. Please call patient when rx has been sent. Patient scheduled appointment for Friday.

## 2014-01-28 NOTE — Telephone Encounter (Signed)
Son of patient called in and wanted to know why patient could not be treated over the phone since she was an established pt with Dr. Arnoldo Morale for over 25 years.  Advised that pt is new to Dr. Larose Kells and since he is not familiar with pt's hx, he can not treat over the phone.  He advised pt be seen in ER or UC if pain was as bad as described.  Son understood.

## 2014-01-30 ENCOUNTER — Ambulatory Visit: Payer: Medicare Other | Admitting: Internal Medicine

## 2014-02-03 ENCOUNTER — Encounter: Payer: Self-pay | Admitting: Internal Medicine

## 2014-02-03 ENCOUNTER — Ambulatory Visit (INDEPENDENT_AMBULATORY_CARE_PROVIDER_SITE_OTHER): Payer: Medicare Other | Admitting: Internal Medicine

## 2014-02-03 VITALS — BP 162/73 | HR 74 | Temp 97.5°F | Wt 149.1 lb

## 2014-02-03 DIAGNOSIS — I10 Essential (primary) hypertension: Secondary | ICD-10-CM

## 2014-02-03 DIAGNOSIS — K5732 Diverticulitis of large intestine without perforation or abscess without bleeding: Secondary | ICD-10-CM

## 2014-02-03 DIAGNOSIS — E119 Type 2 diabetes mellitus without complications: Secondary | ICD-10-CM

## 2014-02-03 DIAGNOSIS — K5730 Diverticulosis of large intestine without perforation or abscess without bleeding: Secondary | ICD-10-CM

## 2014-02-03 DIAGNOSIS — F419 Anxiety disorder, unspecified: Secondary | ICD-10-CM

## 2014-02-03 NOTE — Assessment & Plan Note (Signed)
last A1c 7.1, satisfactory, recommend no change

## 2014-02-03 NOTE — Assessment & Plan Note (Signed)
BP slightly elevated today, last BMP satisfactory, will recheck BP on return to the office and adjust medication if necessary

## 2014-02-03 NOTE — Assessment & Plan Note (Signed)
The patient has a history of diverticulosis and occasional episodes of left lower quadrant abdominal pain, in the past she used to called in for antibiotics with resolution of symptoms.  We had a long conversation about this issue, she is very upset about me not prescribing antibiotics over the phone. I explained the patient that if a 80 year old lady has abdominal pain she must be seen, the risk is that she may have something different to diverticulitis and could get seriously ill.  I recommend her to see a MD if she has symptoms. I am not sure if she accepted my advise

## 2014-02-03 NOTE — Progress Notes (Signed)
Pre visit review using our clinic review tool, if applicable. No additional management support is needed unless otherwise documented below in the visit note. 

## 2014-02-03 NOTE — Progress Notes (Signed)
Subjective:    Patient ID: Patricia Moore, female    DOB: [birth year], 80 y.o.   MRN: [id number]  DOS:  02/03/2014 Type of visit - description : acute Interval history: Last week she developed left lower quadrant abdominal pain, she called the office, she liked a prescription for Cipro and Flagyl, I declined, the patient is extremely upset about this issue. She call another MD and got  the prescriptions, reports she is better. See assessment and plan. Anxiety, on Xanax as needed, h/o Low risk UDS Diabetes, likes to discuss recent results. I recommend a flu shot, she declined   ROS At this point denies fever or chills No nausea, vomiting, diarrhea or blood in the stools No dysuria, gross hematuria or difficulty urinating No chest pain  Past Medical History  Diagnosis Date  . Allergy   . Hypertension   . Osteoporosis   . Diabetes mellitus   . Back pain   . Arthritis   . Hx of adenomatous colonic polyps   . Diverticulosis   . Hyperlipidemia   . H. pylori infection 2001  . Intraductal papilloma   . 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 Emily Filbert  . Diverticulitis     patient states has had three attacks   . Anxiety 12/09/2013    Past Surgical History  Procedure Laterality Date  . Breast biopsy  '91, '95    INTRADUCTAL PAPILLOMA  . Breast lumpectomy    . Esophagogastroduodenoscopy    . Colonoscopy           History   Social History  . Marital Status: Married    Spouse Name: N/A    Number of Children: 1  . Years of Education: N/A   Occupational History  . retired     Social History Main Topics  . Smoking status: Never Smoker   . Smokeless tobacco: Never Used  . Alcohol Use: 0.6 oz/week    1 Glasses of wine per week     Comment: OCCASIONALLY  . Drug Use: No  . Sexual Activity: No   Other Topics Concern  . Not on file   Social History Narrative   Born in [place], ,moved to  the [place] in the 60s, live in [place] then in [place]   Lives w/ husband        Medication List       This list is accurate as of: 02/03/14  6:49 PM.  Always use your most recent med list.               ALPRAZolam 0.25 MG tablet  Commonly known as:  XANAX  TAKE ONE TABLET BY MOUTH THREE TIMES DAILY AS NEEDED     cholecalciferol 1000 UNITS tablet  Commonly known as:  VITAMIN D  Take 1,000 Units by mouth daily.     glucose blood test strip  Commonly known as:  ONE TOUCH ULTRA TEST  USE ONE STRIP TO CHECK GLUCOSE 2 TIMES DAILY ONLY.     glyBURIDE 2.5 MG tablet  Commonly known as:  DIABETA  TAKE ONE TABLET BY MOUTH TWICE DAILY WITH A MEAL     metFORMIN 500 MG tablet  Commonly known as:  GLUCOPHAGE  Take 1 tablet (500 mg total) by mouth 2 (two) times daily with a meal.     onetouch ultrasoft lancets  1 each by Other route daily. Use as instructed  Objective:   Physical Exam BP 162/73  Pulse 74  Temp(Src) 97.5 F (36.4 C) (Oral)  Wt 149 lb 2 oz (67.643 kg)  SpO2 98%  LMP 04/17/1988 General -- alert, well-developed, NAD.    HEENT-- Not pale.   Lungs -- normal respiratory effort, no intercostal retractions, no accessory muscle use, and normal breath sounds.  Heart-- normal rate, regular rhythm, no murmur.  Abdomen-- Not distended, good bowel sounds,soft, non-tender. No rebound or rigidity.   Extremities-- no pretibial edema bilaterally  Neurologic--  alert & oriented X3. Speech normal, gait appropriate for age, strength symmetric and appropriate for age.  Psych-- Cognition and judgment appear intact. Cooperative with normal attention span and concentration. No anxious or depressed appearing.      Assessment & Plan:    Today , I spent more than 25   min with the patient: >50% of the time counseling regards  Diverticulitis, see assessment and plan. We also discussed her recent labs, she like to review them with me; multiple questions answered to the best of my  ability

## 2014-02-03 NOTE — Assessment & Plan Note (Signed)
Well-controlled with a sporadic use of Xanax

## 2014-02-03 NOTE — Assessment & Plan Note (Signed)
See comments under diverticulosis

## 2014-02-03 NOTE — Patient Instructions (Signed)
If you feel you have diverticulitis, please see a doctor Please come back to the office in 4 months for a routine check up

## 2014-02-16 ENCOUNTER — Encounter: Payer: Self-pay | Admitting: Internal Medicine

## 2014-03-02 ENCOUNTER — Ambulatory Visit: Payer: Medicare Other | Admitting: Internal Medicine

## 2014-03-04 ENCOUNTER — Other Ambulatory Visit: Payer: Self-pay

## 2014-03-04 MED ORDER — GLYBURIDE 2.5 MG PO TABS: ORAL_TABLET | ORAL | Status: DC

## 2014-03-04 MED ORDER — METFORMIN HCL 500 MG PO TABS: 500.0000 mg | ORAL_TABLET | Freq: Two times a day (BID) | ORAL | Status: DC

## 2014-03-11 ENCOUNTER — Ambulatory Visit (INDEPENDENT_AMBULATORY_CARE_PROVIDER_SITE_OTHER): Payer: Medicare Other | Admitting: Internal Medicine

## 2014-03-11 ENCOUNTER — Encounter: Payer: Self-pay | Admitting: Internal Medicine

## 2014-03-11 VITALS — BP 153/77 | HR 81 | Temp 98.2°F | Wt 151.1 lb

## 2014-03-11 DIAGNOSIS — L989 Disorder of the skin and subcutaneous tissue, unspecified: Secondary | ICD-10-CM

## 2014-03-11 DIAGNOSIS — M25519 Pain in unspecified shoulder: Secondary | ICD-10-CM

## 2014-03-11 NOTE — Patient Instructions (Addendum)
Tylenol  500 mg OTC 2 tabs a day every 8 hours as needed for pain  Please come back to the office by 05-2014 for a routine check up

## 2014-03-11 NOTE — Progress Notes (Signed)
Subjective:    Patient ID: Patricia Moore, female    DOB: [birth year], 80 y.o.   MRN: [id number]  DOS:  03/11/2014 Type of visit - description : acute visit Interval history: 2 weeks ago noted a lump on the left side of the scalp. Also has a skin lesion at the R  ear. No bleeding. At the bicipital area, left arm: Swelling? Asymmetry?. Sometimes has pain on the right shoulder but denies fever, chills, unexplained weight loss, admits to occasional headaches particularly since you started with a URI a few days ago. Also had a left great toe ingrown toenail, likes it to be looked at.      Past Medical History  Diagnosis Date  . Allergy   . Hypertension   . Osteoporosis   . Diabetes mellitus   . Back pain   . Arthritis   . Hx of adenomatous colonic polyps   . Diverticulosis   . Hyperlipidemia   . H. pylori infection 2001  . Intraductal papilloma   . 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 Emily Filbert  . Diverticulitis     patient states has had three attacks   . Anxiety 12/09/2013    Past Surgical History  Procedure Laterality Date  . Breast biopsy  '91, '95    INTRADUCTAL PAPILLOMA  . Breast lumpectomy    . Esophagogastroduodenoscopy    . Colonoscopy           History   Social History  . Marital Status: Married    Spouse Name: N/A    Number of Children: 1  . Years of Education: N/A   Occupational History  . retired     Social History Main Topics  . Smoking status: Never Smoker   . Smokeless tobacco: Never Used  . Alcohol Use: 0.6 oz/week    1 Glasses of wine per week     Comment: OCCASIONALLY  . Drug Use: No  . Sexual Activity: No   Other Topics Concern  . Not on file   Social History Narrative   Born in [place], ,moved to the [place] in the 60s, live in [place] then in [place]   Lives w/ husband        Medication List       This list is accurate as of: 03/11/14 11:59 PM.  Always use  your most recent med list.               ALPRAZolam 0.25 MG tablet  Commonly known as:  XANAX  TAKE ONE TABLET BY MOUTH THREE TIMES DAILY AS NEEDED     cholecalciferol 1000 UNITS tablet  Commonly known as:  VITAMIN D  Take 1,000 Units by mouth daily.     glucose blood test strip  Commonly known as:  ONE TOUCH ULTRA TEST  USE ONE STRIP TO CHECK GLUCOSE 2 TIMES DAILY ONLY.     glyBURIDE 2.5 MG tablet  Commonly known as:  DIABETA  TAKE ONE TABLET BY MOUTH TWICE DAILY WITH A MEAL     metFORMIN 500 MG tablet  Commonly known as:  GLUCOPHAGE  Take 1 tablet (500 mg total) by mouth 2 (two) times daily with a meal.     onetouch ultrasoft lancets  1 each by Other route daily. Use as instructed           Objective:   Physical Exam  HENT:  Head:  BP 153/77 mmHg  Pulse 81  Temp(Src) 98.2 F (36.8 C) (Oral)  Wt 151 lb 2 oz (68.55 kg)  SpO2 97%  LMP 04/17/1988 General -- alert, well-developed, NAD.    Extremities-- no pretibial edema bilaterally ; no evidence of ingrown toenail at the left great toe at this time. Arms and armpit are  Examined: no swelling, lump, redness, deformity or asymmetry.  Neurologic--  alert & oriented X3. Speech normal, gait appropriate for age, strength symmetric and appropriate for age.    Psych-- Cognition and judgment appear intact. Cooperative with normal attention span and concentration. No anxious or depressed appearing.        Assessment & Plan:   Skin lesions, L scalp  consistent with seborrheic keratosis, the one on the R ear seems like a verrucaue  due to see dermatology, encouraged to call derm  Shoulder pain, question of arm deformity-- recommend tylenol  needed, call if not better  No evidence of a ingrown toenail

## 2014-03-11 NOTE — Progress Notes (Signed)
Pre visit review using our clinic review tool, if applicable. No additional management support is needed unless otherwise documented below in the visit note. 

## 2014-05-15 ENCOUNTER — Other Ambulatory Visit: Payer: Self-pay | Admitting: Gynecology

## 2014-05-15 DIAGNOSIS — D251 Intramural leiomyoma of uterus: Secondary | ICD-10-CM

## 2014-06-04 ENCOUNTER — Encounter: Payer: Self-pay | Admitting: Internal Medicine

## 2014-06-04 ENCOUNTER — Ambulatory Visit (INDEPENDENT_AMBULATORY_CARE_PROVIDER_SITE_OTHER): Payer: Medicare Other | Admitting: Internal Medicine

## 2014-06-04 VITALS — BP 136/58 | HR 84 | Temp 98.1°F | Ht 64.0 in | Wt 151.2 lb

## 2014-06-04 DIAGNOSIS — F419 Anxiety disorder, unspecified: Secondary | ICD-10-CM

## 2014-06-04 DIAGNOSIS — I10 Essential (primary) hypertension: Secondary | ICD-10-CM

## 2014-06-04 DIAGNOSIS — Z79899 Other long term (current) drug therapy: Secondary | ICD-10-CM | POA: Diagnosis not present

## 2014-06-04 DIAGNOSIS — M810 Age-related osteoporosis without current pathological fracture: Secondary | ICD-10-CM

## 2014-06-04 DIAGNOSIS — C44309 Unspecified malignant neoplasm of skin of other parts of face: Secondary | ICD-10-CM

## 2014-06-04 DIAGNOSIS — E119 Type 2 diabetes mellitus without complications: Secondary | ICD-10-CM

## 2014-06-04 DIAGNOSIS — Z0000 Encounter for general adult medical examination without abnormal findings: Secondary | ICD-10-CM | POA: Insufficient documentation

## 2014-06-04 LAB — CBC WITH DIFFERENTIAL/PLATELET
Basophils Absolute: 0.1 10*3/uL (ref 0.0–0.1)
Basophils Relative: 0.4 % (ref 0.0–3.0)
Eosinophils Absolute: 2.4 10*3/uL — ABNORMAL HIGH (ref 0.0–0.7)
Eosinophils Relative: 18.2 % — ABNORMAL HIGH (ref 0.0–5.0)
HCT: 41.8 % (ref 36.0–46.0)
Hemoglobin: 14.2 g/dL (ref 12.0–15.0)
Lymphocytes Relative: 23.5 % (ref 12.0–46.0)
Lymphs Abs: 3.1 10*3/uL (ref 0.7–4.0)
MCHC: 34.1 g/dL (ref 30.0–36.0)
MCV: 89.5 fl (ref 78.0–100.0)
Monocytes Absolute: 0.8 10*3/uL (ref 0.1–1.0)
Monocytes Relative: 6.5 % (ref 3.0–12.0)
Neutro Abs: 6.7 10*3/uL (ref 1.4–7.7)
Neutrophils Relative %: 51.4 % (ref 43.0–77.0)
Platelets: 326 10*3/uL (ref 150.0–400.0)
RBC: 4.67 Mil/uL (ref 3.87–5.11)
RDW: 13.2 % (ref 11.5–15.5)
WBC: 13.1 10*3/uL — ABNORMAL HIGH (ref 4.0–10.5)

## 2014-06-04 LAB — BASIC METABOLIC PANEL
BUN: 35 mg/dL — ABNORMAL HIGH (ref 6–23)
CO2: 33 mEq/L — ABNORMAL HIGH (ref 19–32)
Calcium: 11.1 mg/dL — ABNORMAL HIGH (ref 8.4–10.5)
Chloride: 99 mEq/L (ref 96–112)
Creatinine, Ser: 0.99 mg/dL (ref 0.40–1.20)
GFR: 57.6 mL/min — ABNORMAL LOW (ref >60.00)
Glucose, Bld: 101 mg/dL — ABNORMAL HIGH (ref 70–99)
Potassium: 4.2 mEq/L (ref 3.5–5.1)
Sodium: 137 mEq/L (ref 135–145)

## 2014-06-04 LAB — MICROALBUMIN / CREATININE URINE RATIO
Creatinine,U: 22.2 mg/dL
Microalb Creat Ratio: 3.2 mg/g (ref 0.0–30.0)
Microalb, Ur: <0.7 mg/dL (ref 0.0–1.9)

## 2014-06-04 LAB — HEMOGLOBIN A1C: Hgb A1c MFr Bld: 7 % — ABNORMAL HIGH (ref 4.6–6.5)

## 2014-06-04 NOTE — Assessment & Plan Note (Signed)
On Xanax as needed, due for a UDS (has not taken Xanax in 2 weeks)

## 2014-06-04 NOTE — Assessment & Plan Note (Signed)
On no medications, BP today is great, check a BMP and a CBC

## 2014-06-04 NOTE — Assessment & Plan Note (Signed)
Good compliance with metformin and glyburide, check A1c and a microalbumin

## 2014-06-04 NOTE — Assessment & Plan Note (Signed)
Needs a new dermatologist, declined a referral, will see which one is covered by her insurance and call me if I referral needed

## 2014-06-04 NOTE — Progress Notes (Signed)
Pre visit review using our clinic review tool, if applicable. No additional management support is needed unless otherwise documented below in the visit note. 

## 2014-06-04 NOTE — Assessment & Plan Note (Signed)
Reports this issue is follow-up by gynecology

## 2014-06-04 NOTE — Patient Instructions (Signed)
Get your blood work before you leave  Also need a urine sample for a UDS    Please come back to the office in 6 months for a physical exam. Come back fasting

## 2014-06-04 NOTE — Assessment & Plan Note (Addendum)
Not doing a physical today but chart is reviewed : Tetanus shot 2007 Pneumonia shot 2014 prevnar--Declined zostavax-- 2011 Last colonoscopy 10-2013, + polyps, next per GI Gyn-- Dr Toney Rakes

## 2014-06-04 NOTE — Progress Notes (Signed)
Subjective:    Patient ID: Patricia Moore, female    DOB: 11-19-1933, 81 y.o.   MRN: [id number]  DOS:  06/04/2014 Type of visit - description : rov Interval history: Anxiety, well-controlled with Xanax sporadically. Needs a UDS Diabetes, taking medications correctly, blood sugars usually in the 130s, improving compared to previous months. She has been able to be more active. No symptoms consistent with low blood sugar Skin lesions, has been unable to see dermatology. History of hypertension, on no medication, BP today is great  Review of Systems Denies chest pain, difficulty breathing, occasionally lower extremity edema at the end of the day No nausea, vomiting, diarrhea, blood in the stools.   Past Medical History  Diagnosis Date  . Allergy   . Hypertension   . Osteoporosis   . Diabetes mellitus   . Back pain   . Arthritis   . Hx of adenomatous colonic polyps   . Diverticulosis   . Hyperlipidemia   . H. pylori infection 2001  . Intraductal papilloma   . 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 Emily Filbert  . Diverticulitis     patient states has had three attacks   . Anxiety 12/09/2013    Past Surgical History  Procedure Laterality Date  . Breast biopsy  '91, '95    INTRADUCTAL PAPILLOMA  . Breast lumpectomy    . Esophagogastroduodenoscopy    . Colonoscopy           History   Social History  . Marital Status: Married    Spouse Name: N/A  . Number of Children: 1  . Years of Education: N/A   Occupational History  . retired     Social History Main Topics  . Smoking status: Never Smoker   . Smokeless tobacco: Never Used  . Alcohol Use: 0.6 oz/week    1 Glasses of wine per week     Comment: OCCASIONALLY  . Drug Use: No  . Sexual Activity: No   Other Topics Concern  . Not on file   Social History Narrative   Born in [place], ,moved to the [place] in the 60s, live in [place] then in [place]   Lives w/ husband        Medication List       This list is accurate as of: 06/04/14  7:01 PM.  Always use your most recent med list.               ALPRAZolam 0.25 MG tablet  Commonly known as:  XANAX  TAKE ONE TABLET BY MOUTH THREE TIMES DAILY AS NEEDED     cholecalciferol 1000 UNITS tablet  Commonly known as:  VITAMIN D  Take 1,000 Units by mouth daily.     glucose blood test strip  Commonly known as:  ONE TOUCH ULTRA TEST  USE ONE STRIP TO CHECK GLUCOSE 2 TIMES DAILY ONLY.     glyBURIDE 2.5 MG tablet  Commonly known as:  DIABETA  TAKE ONE TABLET BY MOUTH TWICE DAILY WITH A MEAL     metFORMIN 500 MG tablet  Commonly known as:  GLUCOPHAGE  Take 1 tablet (500 mg total) by mouth 2 (two) times daily with a meal.     onetouch ultrasoft lancets  1 each by Other route daily. Use as instructed           Objective:   Physical Exam BP 136/58 mmHg  Pulse 84  Temp(Src) 98.1 F (36.7 C) (Oral)  Ht 5\' 4"  (1.626 m)  Wt 151 lb 4 oz (68.607 kg)  BMI 25.95 kg/m2  SpO2 99%  LMP 04/17/1988 General:   Well developed, well nourished . NAD.  HEENT:  Normocephalic . Face symmetric, atraumatic Lungs:  CTA B Normal respiratory effort, no intercostal retractions, no accessory muscle use. Heart: RRR,  no murmur.  Abdomen:  Not distended, soft, non-tender. No rebound or rigidity. No mass,organomegaly Muscle skeletal: no pretibial edema bilaterally  Skin: Not pale. Not jaundice Neurologic:  alert & oriented X3.  Speech normal, gait appropriate for age and unassisted Psych--  Cognition and judgment appear intact.  Cooperative with normal attention span and concentration.  Behavior appropriate. No anxious or depressed appearing.       Assessment & Plan:

## 2014-06-05 DIAGNOSIS — Z1231 Encounter for screening mammogram for malignant neoplasm of breast: Secondary | ICD-10-CM | POA: Diagnosis not present

## 2014-06-08 ENCOUNTER — Encounter: Payer: Self-pay | Admitting: Gynecology

## 2014-06-08 ENCOUNTER — Ambulatory Visit (INDEPENDENT_AMBULATORY_CARE_PROVIDER_SITE_OTHER): Payer: Medicare Other

## 2014-06-08 ENCOUNTER — Ambulatory Visit (INDEPENDENT_AMBULATORY_CARE_PROVIDER_SITE_OTHER): Payer: Medicare Other | Admitting: Gynecology

## 2014-06-08 ENCOUNTER — Other Ambulatory Visit: Payer: Self-pay | Admitting: Gynecology

## 2014-06-08 VITALS — BP 136/80 | Ht 61.25 in | Wt 151.0 lb

## 2014-06-08 DIAGNOSIS — D251 Intramural leiomyoma of uterus: Secondary | ICD-10-CM | POA: Diagnosis not present

## 2014-06-08 DIAGNOSIS — N83319 Acquired atrophy of ovary, unspecified side: Secondary | ICD-10-CM

## 2014-06-08 DIAGNOSIS — R339 Retention of urine, unspecified: Secondary | ICD-10-CM | POA: Diagnosis not present

## 2014-06-08 DIAGNOSIS — Z8639 Personal history of other endocrine, nutritional and metabolic disease: Secondary | ICD-10-CM

## 2014-06-08 DIAGNOSIS — Z01419 Encounter for gynecological examination (general) (routine) without abnormal findings: Secondary | ICD-10-CM | POA: Diagnosis not present

## 2014-06-08 DIAGNOSIS — N852 Hypertrophy of uterus: Secondary | ICD-10-CM | POA: Diagnosis not present

## 2014-06-08 DIAGNOSIS — D72829 Elevated white blood cell count, unspecified: Secondary | ICD-10-CM

## 2014-06-08 DIAGNOSIS — L680 Hirsutism: Secondary | ICD-10-CM

## 2014-06-08 DIAGNOSIS — N952 Postmenopausal atrophic vaginitis: Secondary | ICD-10-CM | POA: Diagnosis not present

## 2014-06-08 DIAGNOSIS — D252 Subserosal leiomyoma of uterus: Secondary | ICD-10-CM | POA: Diagnosis not present

## 2014-06-08 DIAGNOSIS — N8331 Acquired atrophy of ovary: Secondary | ICD-10-CM

## 2014-06-08 MED ORDER — SPIRONOLACTONE 25 MG PO TABS: ORAL_TABLET | ORAL | Status: DC

## 2014-06-08 NOTE — Patient Instructions (Signed)
Hydrochlorothiazide; Spironolactone tablets Qu es este medicamento? La HIDROCLOROTIAZIDA; [place] es una combinacin de dos tipos de diurticos. Ayuda a incrementar el volumen de [place], lo que provoca que el cuerpo pierda agua que esta en exceso. Este medicamento se [place] para tratar la alta presin sangunea o la retencin de agua causadas por enfermedades de cardiaco, heptico o renal. Este medicamento puede ser utilizado para otros usos; si tiene alguna pregunta consulte con su proveedor de atencin mdica o con su farmacutico. MARCAS COMERCIALES DISPONIBLES: Aldactazide Qu le debo informar a mi profesional de la salud antes de tomar este medicamento? Necesita saber si usted presenta alguno de los [employer] o situaciones: -diabetes -problemas del sistema inmunolgico, como lupus -enfermedad renal -enfermedad heptica -una reaccin alrgica o inusual a la hidroclorotiazida, espironolactona, sulfonamidas, a otros medicamentos, alimentos, colorantes o conservantes -si est embarazada o buscando quedar embarazada -si est amamantando a un beb Cmo debo utilizar este medicamento? Tome este medicamento por va oral con un vaso de agua. Siga las instrucciones de la etiqueta del [place]. Usted puede tomar [employer]a con o sin alimentos. Si le produce malestar estomacal, tmelo con alimentos. Tome sus dosis a intervalos regulares. Recuerde que Lexicographer con frecuencia despus de Systems developer. No tome sus dosis en horarios que le causen problemas. No lo tome a la hora de [employer]. No tome su medicamento con una frecuencia mayor a la indicada. No deje de tomarlo excepto si as lo indica su mdico. Hable con su pediatra para informarse acerca del uso de este medicamento en nios. Puede requerir atencin especial. Sobredosis: Pngase en contacto inmediatamente con un centro toxicolgico o una sala de urgencia si usted cree que haya tomado demasiado  medicamento. ATENCIN: [employer] es solo para usted. No comparta este medicamento con nadie. Qu sucede si me olvido de una dosis? Si olvida una dosis, tmela lo antes posible. Si es casi la hora de la prxima dosis, tome slo esa dosis. No tome dosis adicionales o dobles. Qu puede interactuar con este medicamento? No tome esta medicina con ninguno de los siguientes medicamentos: -inhibidores de la ECA, tales como captopril, lisinopril -eplerenona -litio -medicamentos para el dolor o inflamacin, como indometacina -otros diurticos -suplementos de potasio Esta medicina tambin puede interactuar con los siguientes medicamentos: -alcohol -barbitricos para dormir o controlar convulsiones -digoxina -medicamentos para la diabetes -medicamentos para alta presin sangunea o insuficiencia cardiaca -relajantes musculares -agentes bloqueadores neuromusculares utilizados durante la ciruga -algunos analgsicos, como codena -hormonas esteroideas, tales como la cortisona, hidrocortisona o prednisona Puede ser que esta lista no menciona todas las posibles interacciones. Informe a su profesional de [employer]	[place] de [employer] productos a base de hierbas, medicamentos de [place] o suplementos nutritivos que est tomando. Si usted fuma, consume bebidas alcohlicas o si utiliza drogas ilegales, indqueselo tambin a su profesional de [employer]	[place]. Algunas sustancias pueden interactuar con su medicamento. A qu debo estar atento al usar Coca[employer]Cola? Visite a su mdico o a su profesional de la salud para chequear su evolucin peridicamente. Controle su presin sangunea como le haya indicado. Pregunte a su mdico cul debe ser su presin sangunea y cundo deber comunicarse con l/ella. Si es diabtico, controle su nivel de azcar en la sangre como le haya indicado. No debe deshidratarse. Pregunte a su mdico o a su profesional de la salud cunto lquido debe beber [employer]. Si sufre ataques  severos de diarrea, nuseas, vmito o si suda demasiado, consulte con l o ella. La prdida excesiva de  lquidos corporales puede hacer que sea peligroso tomar Coca-Cola. Puede experimentar somnolencia o mareos. No conduzca ni utilice maquinaria, ni haga nada que Associate Professor en estado de alerta hasta que sepa cmo le afecta este medicamento. No se siente ni se ponga de pie con rapidez, especialmente si es un paciente de edad avanzada. Esto reduce el riesgo de mareos o [employer]. El alcohol puede interferir con el efecto del medicamento. Evite consumir bebidas alcohlicas. Este medicamento puede aumentar la sensibilidad al sol. Mantngase fuera de Administrator. Si no lo puede evitar, utilice ropa protectora y crema de Photographer. No utilice lmparas solares, camas solares ni cabinas solares. Qu efectos secundarios puedo tener al [employer] este medicamento? Efectos secundarios que debe informar a su mdico o a Barrister's clerk de la salud tan pronto como sea posible: -Chief of Staff como erupcin cutnea, picazn o urticarias, hinchazn de los labios, boca, lengua o garganta -heces de color oscuro o con aspecto alquitranado -cambios en la visin -dificultad al respirar -dolor en los ojos -pulso cardiaco rpido, irregular -sensacin de desmayos o mareos -fiebre -dolor de la gota -dolor, calambre muscular -hormigueo, entumecimiento de manos o pies -enrojecimiento, formacin de ampollas, descamacin o distensin de la piel, inclusive dentro de la boca -dificultad para orinar -sangrado inusual -cansancio o debilidad inusual -color amarillento de los ojos o la piel Efectos secundarios que, por lo general, no requieren atencin mdica (debe informarlos a su mdico o a su profesional de la salud si persisten o si son molestos): -agrandamiento de los [place], tanto en hombres como mujeres -cambios en el deseo sexual o capacidad -estreimiento -boca seca, aumento de la  sed -dolor de cabeza -perodos menstruales irregulares -Higher education careers adviser, nuseas Puede ser que esta lista no menciona todos los posibles efectos secundarios. Comunquese a su mdico por asesoramiento mdico [employer]. Usted puede informar los efectos secundarios a la FDA por telfono al 1-800-FDA-1088. Dnde debo guardar mi medicina? Mantngala fuera del alcance de los nios. [place] a una [employer] de 25 grados C (73 grados F). Deseche todo el medicamento que no haya utilizado, despus de la fecha de vencimiento. ATENCIN: Este folleto es un resumen. Puede ser que no cubra toda la posible informacin. Si usted tiene preguntas acerca de esta medicina, consulte con su mdico, su farmacutico o su profesional de Technical sales engineer.  2015, Elsevier/[employer] Standard. (2010-02-08 20:18:12)

## 2014-06-08 NOTE — Progress Notes (Signed)
Patricia Moore June 29, 1933 [id number]   History:    81 y.o.  for annual gyn exam who is complaining for quite some time of her hirsutism. She also has history of a fibroid uterus for which we are continuing to monitor yearly with ultrasound which was performed today. Her ultrasound demonstrated the following:  Uterus measured 9.6 x 8.9 x 5.1 cm with an endometrial stripe of 2.8 mm. Patient with 4 calcified fibroids the largest one measuring 42 x 36 mm. Right and left ovary were atrophic. No significant change from previous ultrasound one year ago.  Patient has had history of vaginal atrophy not sexually active currently not using estrogen replacement therapy. She has had history vitamin D deficiency in the past which we'll check her vitamin D level today. Her primary care physician did her blood work last week and it appears she has mild leukocytosis and slight hypercalcemia. She states that she is not taking any calcium or vitamin D.  As a result of her hirsutism we have tested her in the past and she had a normal 17 hydroxyprogesterone, DHEAS and total testosterone. Patient does have history of osteopenia whereby her last bone density study in 2015 demonstrated the lowest T score of -1.1 at the AP spine. No significant change from previous scan 2 years prior. She is currently on a drug-free holiday since she had been on Actonel for several years in the past.  Patient was strong family history of colon cancer and had colon polyp removed in 2015. Patient with no past history of abnormal Pap smears. Patient's immunizations are up-to-date.  Past medical history,surgical history, family history and social history were all reviewed and documented in the EPIC chart.  Gynecologic History Patient's last menstrual period was 04/17/1988. Contraception: post menopausal status Last Pap: 2012. Results were: normal Last mammogram: 2016. Results were: normal  Obstetric History OB History  Gravida Para  Term Preterm AB SAB TAB Ectopic Multiple Living  2 1   1 1    1     # Outcome Date GA Lbr Len/2nd Weight Sex Delivery Anes PTL Lv  2 SAB           1 Para                ROS: A ROS was performed and pertinent positives and negatives are included in the history.  GENERAL: No fevers or chills. HEENT: No change in vision, no earache, sore throat or sinus congestion. NECK: No pain or stiffness. CARDIOVASCULAR: No chest pain or pressure. No palpitations. PULMONARY: No shortness of breath, cough or wheeze. GASTROINTESTINAL: No abdominal pain, nausea, vomiting or diarrhea, melena or bright red blood per rectum. GENITOURINARY: No urinary frequency, urgency, hesitancy or dysuria. MUSCULOSKELETAL: No joint or muscle pain, no back pain, no recent trauma. DERMATOLOGIC: No rash, no itching, no lesions. ENDOCRINE: No polyuria, polydipsia, no heat or cold intolerance. No recent change in weight. HEMATOLOGICAL: No anemia or easy bruising or bleeding. NEUROLOGIC: No headache, seizures, numbness, tingling or weakness. PSYCHIATRIC: No depression, no loss of interest in normal activity or change in sleep pattern. Hirsutism  Exam: chaperone present  BP 136/80 mmHg  Ht 5' 1.25" (1.556 m)  Wt 151 lb (68.493 kg)  BMI 28.29 kg/m2  LMP 04/17/1988  Body mass index is 28.29 kg/(m^2).  General appearance : Well developed well nourished female. No acute distress HEENT: Eyes: no retinal hemorrhage or exudates,  Neck supple, trachea midline, no carotid bruits, no thyroidmegaly Lungs: Clear to  auscultation, no rhonchi or wheezes, or rib retractions  Heart: Regular rate and rhythm, no murmurs or gallops Breast:Examined in sitting and supine position were symmetrical in appearance, no palpable masses or tenderness,  no skin retraction, no nipple inversion, no nipple discharge, no skin discoloration, no axillary or supraclavicular lymphadenopathy Abdomen: no palpable masses or tenderness, no rebound or guarding Extremities:  no edema or skin discoloration or tenderness  Pelvic:  Bartholin, Urethra, Skene Glands: Within normal limits             Vagina: No gross lesions or discharge, vaginal atrophy  Cervix: No gross lesions or discharge  Uterus  anteverted, normal size, shape and consistency, non-tender and mobile  Adnexa  Without masses or tenderness  Anus and perineum  normal   Rectovaginal  normal sphincter tone without palpated masses or tenderness             Hemoccult recent colonoscopy     Assessment/Plan:  81 y.o. female for annual exam with several issues discussed today: #1 review of labs done by her PCP last week indicated that she has mild leukocytosis and hypercalcemia if she does not hear from him this week she will contact him. #2 postmenopausal hirsutism we discussed starting her on Aldactone half a tablet of 25 mg. I want her to hold off on this medication until she checks with her primary care physician after her hypercalcemia has been addressed. #3 vaginal atrophy does not request for any treatment at the present time. #4 vitamin D deficiency will check vitamin D level today. PCP we'll be doing all her blood work. #5 stable fibroid uterus no vaginal bleeding reported #6 Pap smear not done according to the new guidelines.   Ok Edwards MD, 10:44 AM 06/08/2014

## 2014-06-09 ENCOUNTER — Telehealth: Payer: Self-pay

## 2014-06-09 DIAGNOSIS — D72829 Elevated white blood cell count, unspecified: Secondary | ICD-10-CM

## 2014-06-09 LAB — VITAMIN D 25 HYDROXY (VIT D DEFICIENCY, FRACTURES): Vit D, 25-Hydroxy: 36 ng/mL (ref 30–100)

## 2014-06-09 NOTE — Telephone Encounter (Signed)
Spoke with Pt, informed her to hold Spironolactone until blood work next month. Pt has scheduled lab appt for July 08, 2013 at 0900 to check CBC, and Ionized calcium. Pt verbalized understanding. CBC w/ Diff and Ionized Calcium labs ordered.

## 2014-06-09 NOTE — Telephone Encounter (Signed)
-----   Message from Colon Branch, MD sent at 06/08/2014  9:45 PM EST ----- Regarding: call pt Her gynecologist sent me a note, he recommended spironolactone, tell the patient to hold it until we recheck her blood next month.

## 2014-06-12 ENCOUNTER — Telehealth: Payer: Self-pay

## 2014-06-12 NOTE — Telephone Encounter (Signed)
UDS: 06/04/2014   Negative for Alprazolam: PRN   Low risk per Dr. Larose Kells 06/12/2014

## 2014-06-29 ENCOUNTER — Ambulatory Visit (INDEPENDENT_AMBULATORY_CARE_PROVIDER_SITE_OTHER): Payer: Medicare Other | Admitting: Medical

## 2014-06-29 ENCOUNTER — Encounter: Payer: Self-pay | Admitting: Medical

## 2014-06-29 ENCOUNTER — Telehealth: Payer: Self-pay | Admitting: Medical

## 2014-06-29 VITALS — BP 136/62 | HR 76 | Temp 98.0°F | Ht 61.25 in | Wt 152.2 lb

## 2014-06-29 DIAGNOSIS — K5732 Diverticulitis of large intestine without perforation or abscess without bleeding: Secondary | ICD-10-CM

## 2014-06-29 LAB — CBC WITH DIFFERENTIAL/PLATELET
Basophils Absolute: 0.1 10*3/uL (ref 0.0–0.1)
Basophils Relative: 0.8 % (ref 0.0–3.0)
Eosinophils Absolute: 1.8 10*3/uL — ABNORMAL HIGH (ref 0.0–0.7)
Eosinophils Relative: 16.8 % — ABNORMAL HIGH (ref 0.0–5.0)
HCT: 39.7 % (ref 36.0–46.0)
Hemoglobin: 13.4 g/dL (ref 12.0–15.0)
Lymphocytes Relative: 27.8 % (ref 12.0–46.0)
Lymphs Abs: 3 10*3/uL (ref 0.7–4.0)
MCHC: 33.8 g/dL (ref 30.0–36.0)
MCV: 89.3 fl (ref 78.0–100.0)
Monocytes Absolute: 0.8 10*3/uL (ref 0.1–1.0)
Monocytes Relative: 7.7 % (ref 3.0–12.0)
Neutro Abs: 5 10*3/uL (ref 1.4–7.7)
Neutrophils Relative %: 46.9 % (ref 43.0–77.0)
Platelets: 315 10*3/uL (ref 150.0–400.0)
RBC: 4.45 Mil/uL (ref 3.87–5.11)
RDW: 12.6 % (ref 11.5–15.5)
WBC: 10.7 10*3/uL — ABNORMAL HIGH (ref 4.0–10.5)

## 2014-06-29 MED ORDER — CIPROFLOXACIN HCL 500 MG PO TABS: 500.0000 mg | ORAL_TABLET | Freq: Two times a day (BID) | ORAL | Status: DC

## 2014-06-29 MED ORDER — METRONIDAZOLE 500 MG PO TABS: 500.0000 mg | ORAL_TABLET | Freq: Three times a day (TID) | ORAL | Status: DC

## 2014-06-29 NOTE — Progress Notes (Signed)
Pre visit review using our clinic review tool, if applicable. No additional management support is needed unless otherwise documented below in the visit note. 

## 2014-06-29 NOTE — Assessment & Plan Note (Signed)
Probable based on location of pain and her hx. Will rx cipro and flagyl. Cbc today. If pain worsen/not improving then notify us and order ct abd/pelvis.  If severe acute change after hours then ED evaluation for imaging.  Follow up in 7-10 days or as needed.

## 2014-06-29 NOTE — Progress Notes (Signed)
Subjective:    Patient ID: Patricia Moore, female    DOB: 15-Jun-1933, 81 y.o.   MRN: [id number]  HPI      Pt in with pain in LLQ. Pain on Saturday. Pt pain started mild and is still very mild per pt . No fever, no diarrhea. Pt has hx of diverticultis various times in the past.  She states she gets treated early and avoids any complications. No dark or black stools. No bright red blood in stools either.  Pt had colonsocopy in 2015. Had diverticulosis.     Review of Systems  Constitutional: Negative for fever, chills and fatigue.  Respiratory: Negative for cough, choking and wheezing.   Cardiovascular: Negative for chest pain and palpitations.  Gastrointestinal: Positive for abdominal pain. Negative for nausea, vomiting, diarrhea, constipation, blood in stool, abdominal distention, anal bleeding and rectal pain.       Faint llq pain.  Genitourinary: Negative.   Musculoskeletal: Negative for back pain.  Skin: Negative for rash.  Neurological: Negative for dizziness, syncope, speech difficulty, weakness, light-headedness and numbness.  Hematological: Negative for adenopathy.  Psychiatric/Behavioral: Negative for confusion and sleep disturbance.   Past Medical History  Diagnosis Date  . Allergy   . Hypertension   . Osteoporosis   . Diabetes mellitus   . Back pain   . Arthritis   . Hx of adenomatous colonic polyps   . Diverticulosis   . Hyperlipidemia   . H. pylori infection 2001  . Intraductal papilloma   . 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 Emily Filbert  . Diverticulitis     patient states has had three attacks   . Anxiety 12/09/2013    History   Social History  . Marital Status: Married    Spouse Name: N/A  . Number of Children: 1  . Years of Education: N/A   Occupational History  . retired     Social History Main Topics  . Smoking status: Never Smoker   . Smokeless tobacco:  Never Used  . Alcohol Use: 0.6 oz/week    1 Glasses of wine per week     Comment: OCCASIONALLY  . Drug Use: No  . Sexual Activity: No   Other Topics Concern  . Not on file   Social History Narrative   Born in [place], ,moved to the [place] in the 60s, live in [place] then in [place]   Lives w/ husband    Past Surgical History  Procedure Laterality Date  . Breast biopsy  '91, '95    INTRADUCTAL PAPILLOMA  . Breast lumpectomy    . Esophagogastroduodenoscopy    . Colonoscopy           Family History  Problem Relation Age of Onset  . Adopted: Yes  . Hypertension    . Diabetes    . Heart disease Mother   . Heart disease Father   . Breast cancer Maternal Aunt   . Stroke Maternal Aunt   . Colon cancer Paternal Uncle     7 uncles  . Cancer Maternal Grandfather     COLON,, FAMILY HX 7 MEMBERS  . Breast cancer Maternal Aunt   . Cancer Sister     skin    No Known Allergies  Current Outpatient Prescriptions on File Prior to Visit  Medication Sig Dispense Refill  . ALPRAZolam (XANAX) 0.25 MG tablet TAKE ONE TABLET BY MOUTH THREE TIMES DAILY  AS NEEDED 30 tablet 5  . cholecalciferol (VITAMIN D) 1000 UNITS tablet Take 1,000 Units by mouth daily.    Marland Kitchen glucose blood (ONE TOUCH ULTRA TEST) test strip USE ONE STRIP TO CHECK GLUCOSE 2 TIMES DAILY ONLY. 100 each 12  . glyBURIDE (DIABETA) 2.5 MG tablet TAKE ONE TABLET BY MOUTH TWICE DAILY WITH A MEAL 60 tablet 5  . Lancets (ONETOUCH ULTRASOFT) lancets 1 each by Other route daily. Use as instructed 300 each 6  . metFORMIN (GLUCOPHAGE) 500 MG tablet Take 1 tablet (500 mg total) by mouth 2 (two) times daily with a meal. 60 tablet 5  . spironolactone (ALDACTONE) 25 MG tablet Take half a tablet daily 30 tablet 11   No current facility-administered medications on file prior to visit.    BP 136/62 mmHg  Pulse 76  Temp(Src) 98 F (36.7 C) (Oral)  Ht 5' 1.25" (1.556 m)  Wt 152 lb 3.2 oz (69.037 kg)  BMI 28.51 kg/m2  SpO2 99%  LMP 04/17/1988        Objective:   Physical Exam   General Appearance- Not in acute distress.  HEENT Eyes- Scleraeral/Conjuntiva-bilat- Not Yellow. Mouth & Throat- Normal.  Chest and Lung Exam Auscultation: Breath sounds:-Normal. Adventitious sounds:- No Adventitious sounds.  Cardiovascular Auscultation:Rythm - Regular. Heart Sounds -Normal heart sounds.  Abdomen Inspection:-Inspection Normal.  Palpation/Perucssion: Palpation and Percussion of the abdomen reveal- mild-  Moderate left lower quadrantTender, No Rebound tenderness, No rigidity(Guarding) and No Palpable abdominal masses.  Liver:-Normal.  Spleen:- Normal.   Back- no cva tenderness.      Assessment & Plan:

## 2014-06-29 NOTE — Patient Instructions (Signed)
Diverticulitis of colon Probable based on location of pain and her hx. Will rx cipro and flagyl. Cbc today. If pain worsen/not improving then notify us and order ct abd/pelvis.  If severe acute change after hours then ED evaluation for imaging.  Follow up in 7-10 days or as needed.    Advised to stop diabeta/glyburide while on cipro. Interaction of med advised.

## 2014-06-29 NOTE — Telephone Encounter (Signed)
I called pt and notified her of her improving wbc count.

## 2014-07-01 ENCOUNTER — Other Ambulatory Visit: Payer: Self-pay

## 2014-07-01 MED ORDER — GLYBURIDE 2.5 MG PO TABS: ORAL_TABLET | ORAL | Status: DC

## 2014-07-01 MED ORDER — METFORMIN HCL 500 MG PO TABS: 500.0000 mg | ORAL_TABLET | Freq: Two times a day (BID) | ORAL | Status: DC

## 2014-07-07 ENCOUNTER — Telehealth: Payer: Self-pay

## 2014-07-07 DIAGNOSIS — F419 Anxiety disorder, unspecified: Secondary | ICD-10-CM

## 2014-07-07 MED ORDER — ALPRAZOLAM 0.25 MG PO TABS: ORAL_TABLET | ORAL | Status: DC

## 2014-07-07 NOTE — Telephone Encounter (Signed)
Okay print  #30 and 5 refills

## 2014-07-07 NOTE — Telephone Encounter (Signed)
Pt is requesting refill on Alprazolam.   Last OV: 06/04/2014 Last Fill: 12/09/2013 #30 5RF UDS: 06/04/2014 Low risk  Please advise.

## 2014-07-07 NOTE — Telephone Encounter (Signed)
Rx printed, awaiting signature by Dr. Paz.  

## 2014-07-07 NOTE — Telephone Encounter (Signed)
Faxed to [facility] pharmacy

## 2014-07-09 ENCOUNTER — Telehealth: Payer: Self-pay | Admitting: Internal Medicine

## 2014-07-09 ENCOUNTER — Other Ambulatory Visit (INDEPENDENT_AMBULATORY_CARE_PROVIDER_SITE_OTHER): Payer: Medicare Other

## 2014-07-09 DIAGNOSIS — D72829 Elevated white blood cell count, unspecified: Secondary | ICD-10-CM | POA: Diagnosis not present

## 2014-07-09 LAB — CBC WITH DIFFERENTIAL/PLATELET
Basophils Absolute: 0.1 10*3/uL (ref 0.0–0.1)
Basophils Relative: 0.7 % (ref 0.0–3.0)
Eosinophils Absolute: 1.8 10*3/uL — ABNORMAL HIGH (ref 0.0–0.7)
Eosinophils Relative: 18.6 % — ABNORMAL HIGH (ref 0.0–5.0)
HCT: 37.5 % (ref 36.0–46.0)
Hemoglobin: 12.9 g/dL (ref 12.0–15.0)
Lymphocytes Relative: 34.1 % (ref 12.0–46.0)
Lymphs Abs: 3.3 10*3/uL (ref 0.7–4.0)
MCHC: 34.3 g/dL (ref 30.0–36.0)
MCV: 88.4 fl (ref 78.0–100.0)
Monocytes Absolute: 0.7 10*3/uL (ref 0.1–1.0)
Monocytes Relative: 7.4 % (ref 3.0–12.0)
Neutro Abs: 3.8 10*3/uL (ref 1.4–7.7)
Neutrophils Relative %: 39.2 % — ABNORMAL LOW (ref 43.0–77.0)
Platelets: 331 10*3/uL (ref 150.0–400.0)
RBC: 4.24 Mil/uL (ref 3.87–5.11)
RDW: 12.7 % (ref 11.5–15.5)
WBC: 9.7 10*3/uL (ref 4.0–10.5)

## 2014-07-09 MED ORDER — METFORMIN HCL 500 MG PO TABS: 500.0000 mg | ORAL_TABLET | Freq: Two times a day (BID) | ORAL | Status: DC

## 2014-07-09 MED ORDER — GLYBURIDE 2.5 MG PO TABS: 2.5000 mg | ORAL_TABLET | Freq: Every day | ORAL | Status: DC

## 2014-07-09 NOTE — Telephone Encounter (Signed)
Pt requesting to speak to MD regarding latest lab results, spoke with her on 06/09/2014 regarding 06/04/2014 lab results. Unsure what lab results she didn't get results for.    Metformin recently refilled on 07/01/2014 # 30 5RF Glyburide recently refilled on 07/01/2014 # 30 5RF Alprazolam recently refilled on 07/07/2014 # 30 5RF

## 2014-07-09 NOTE — Telephone Encounter (Signed)
Pt does not currently need meds refilled, refills recently sent to pharmacy. Pt is requesting lab results (CBC) from visit in March 2016, which you ordered.

## 2014-07-09 NOTE — Telephone Encounter (Signed)
FYI

## 2014-07-09 NOTE — Telephone Encounter (Signed)
Per ordering provider Percell Miller)

## 2014-07-09 NOTE — Telephone Encounter (Signed)
PAtient requests lab results

## 2014-07-09 NOTE — Telephone Encounter (Signed)
Caller name: Saroya, Riccobono Relation to pt: SELF  Call back number: [phone] Pharmacy: [facility] [address] [place] [address] [phone] (Phone) [phone] (Fax)        Reason for call:  Pt requesting a refill  metFORMIN (GLUCOPHAGE) 500 MG tablet  glyBURIDE (DIABETA) 2.5 MG tablet  ALPRAZolam (XANAX) 0.25 MG tablet   Pt states she is a little frustrated because prior lab results was never disclosed to her and does not have a clear understanding of her prior lab work. Pt would like to speak with MD after 12noon pt was in today due to lab work and expressed she would like some understanding on prior results.

## 2014-07-10 LAB — CALCIUM, IONIZED: Calcium, Ion: 1.28 mmol/L (ref 1.12–1.32)

## 2014-07-17 ENCOUNTER — Other Ambulatory Visit: Payer: Self-pay

## 2014-07-17 NOTE — Telephone Encounter (Signed)
Called patient left message fo call back to determine which lab results she is requesting.

## 2014-07-20 ENCOUNTER — Ambulatory Visit (INDEPENDENT_AMBULATORY_CARE_PROVIDER_SITE_OTHER): Payer: Medicare Other | Admitting: Internal Medicine

## 2014-07-20 ENCOUNTER — Encounter: Payer: Self-pay | Admitting: Internal Medicine

## 2014-07-20 VITALS — BP 122/62 | HR 82 | Temp 98.1°F | Ht 61.25 in | Wt 151.4 lb

## 2014-07-20 DIAGNOSIS — I10 Essential (primary) hypertension: Secondary | ICD-10-CM

## 2014-07-20 DIAGNOSIS — K5732 Diverticulitis of large intestine without perforation or abscess without bleeding: Secondary | ICD-10-CM | POA: Diagnosis not present

## 2014-07-20 DIAGNOSIS — E119 Type 2 diabetes mellitus without complications: Secondary | ICD-10-CM

## 2014-07-20 DIAGNOSIS — F419 Anxiety disorder, unspecified: Secondary | ICD-10-CM

## 2014-07-20 DIAGNOSIS — J309 Allergic rhinitis, unspecified: Secondary | ICD-10-CM

## 2014-07-20 NOTE — Progress Notes (Signed)
Subjective:    Patient ID: Patricia Moore, female    DOB: 10-Sep-1933, 81 y.o.   MRN: [id number]  DOS:  07/20/2014 Type of visit - description : acute visit Interval history:  Patricia Moore is here for an acute visit addressing her allergic rhinitis, currently she is complaining of pruritic, noninflamed, nondraining eyes accompanied by mild dull headaches for the past few weeks. She is not currently on any medications for allergies or headaches, but she does perform a saline rinse daily. She denies any congestion, rhinorrhea, sore throat, or vision disturbances.  Additionally, she has had mild facial hair growth and would like to begin taking spironolactone as treatment and has concerns regarding side effects and pertinent lab values.   Diabetes: The patient was concerned about her recent elevated A1c of 7.0%.  Patricia Moore also is concerned regarding her multiple episodes of diverticulitis, all episodes have been well-controlled with antibiotics.   The patient also filled out a controlled substance form for her use of xanax.  Review of Systems  GENERAL: No fever, chills CV: No  CP, SOB No palpitations, no lower extremity edema GI: No nausea/vomiting or abdominal pain No dysphagia or odynophagia RESPIRATORY: (-) cough, sputum production NEURO: Headaches per HPI Denies diplopia Denies dizziness HEENT: As per HPI Left eye cataract, no nosebleeds     Past Medical History  Diagnosis Date  . Allergy   . Hypertension   . Osteoporosis   . Diabetes mellitus   . Back pain   . Arthritis   . Hx of adenomatous colonic polyps   . Diverticulosis   . Hyperlipidemia   . H. pylori infection 2001  . Intraductal papilloma   . 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 Emily Filbert  . Diverticulitis     patient states has had three attacks   . Anxiety 12/09/2013    Past Surgical History  Procedure Laterality Date    . Breast biopsy  '91, '95    INTRADUCTAL PAPILLOMA  . Breast lumpectomy    . Esophagogastroduodenoscopy    . Colonoscopy           History   Social History  . Marital Status: Married    Spouse Name: N/A  . Number of Children: 1  . Years of Education: N/A   Occupational History  . retired     Social History Main Topics  . Smoking status: Never Smoker   . Smokeless tobacco: Never Used  . Alcohol Use: 0.6 oz/week    1 Glasses of wine per week     Comment: OCCASIONALLY  . Drug Use: No  . Sexual Activity: No   Other Topics Concern  . Not on file   Social History Narrative   Born in [place], ,moved to the [place] in the 60s, live in [place] then in [place]   Lives w/ husband        Medication List       This list is accurate as of: 07/20/14  6:42 PM.  Always use your most recent med list.               ALPRAZolam 0.25 MG tablet  Commonly known as:  XANAX  TAKE ONE TABLET BY MOUTH THREE TIMES DAILY AS NEEDED     cholecalciferol 1000 UNITS tablet  Commonly known as:  VITAMIN D  Take 1,000 Units by mouth daily.     glucose blood  test strip  Commonly known as:  ONE TOUCH ULTRA TEST  USE ONE STRIP TO CHECK GLUCOSE 2 TIMES DAILY ONLY.     glyBURIDE 2.5 MG tablet  Commonly known as:  DIABETA  Take 1 tablet (2.5 mg total) by mouth daily with breakfast.     metFORMIN 500 MG tablet  Commonly known as:  GLUCOPHAGE  Take 1 tablet (500 mg total) by mouth 2 (two) times daily with a meal.     onetouch ultrasoft lancets  1 each by Other route daily. Use as instructed     spironolactone 25 MG tablet  Commonly known as:  ALDACTONE  Take half a tablet daily           Objective:   Physical Exam BP 122/62 mmHg  Pulse 82  Temp(Src) 98.1 F (36.7 C) (Oral)  Ht 5' 1.25" (1.556 m)  Wt 151 lb 6 oz (68.663 kg)  BMI 28.36 kg/m2  SpO2 97%  LMP 04/17/1988  General:   Well developed, well nourished . NAD.  HEENT:  Normocephalic . Face symmetric, atraumatic Nares pink healthy  membranes, nose slt congested Eyes w/o redness or d/c Lungs:  CTA B Normal respiratory effort, no intercostal retractions, no accessory muscle use. Heart: RRR,  no murmur.  Muscle skeletal: no pretibial edema bilaterally  Skin: Not pale. Not jaundice Neurologic:  alert & oriented X3.  Speech normal, gait appropriate for age and unassisted Psych--  Cognition and judgment appear intact.  Cooperative with normal attention span and concentration.  Behavior appropriate. No anxious or depressed appearing.       Assessment & Plan:

## 2014-07-20 NOTE — Progress Notes (Signed)
Pre visit review using our clinic review tool, if applicable. No additional management support is needed unless otherwise documented below in the visit note. 

## 2014-07-20 NOTE — Assessment & Plan Note (Signed)
On as needed Xanax, contract signed today

## 2014-07-20 NOTE — Patient Instructions (Signed)
For allergies: Take OTC Claritin as needed Flonase 2 sprays in each side of the nose every day until the allergy season is over Artificial tears to your eyes as needed If your eyes are not improving let me know  Start taking the spironolactone half tablet daily Schedule labs to be done in one month: BMP, ionized calcium ---DX hypertension  Next visit by august

## 2014-07-20 NOTE — Progress Notes (Deleted)
Subjective:    Patient ID: Patricia Moore, female    DOB: 11-15-1933, 81 y.o.   MRN: [id number]  DOS:  07/20/2014 Type of visit - description :  Interval history:    Review of Systems   Past Medical History  Diagnosis Date  . Allergy   . Hypertension   . Osteoporosis   . Diabetes mellitus   . Back pain   . Arthritis   . Hx of adenomatous colonic polyps   . Diverticulosis   . Hyperlipidemia   . H. pylori infection 2001  . Intraductal papilloma   . 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 Emily Filbert  . Diverticulitis     patient states has had three attacks   . Anxiety 12/09/2013    Past Surgical History  Procedure Laterality Date  . Breast biopsy  '91, '95    INTRADUCTAL PAPILLOMA  . Breast lumpectomy    . Esophagogastroduodenoscopy    . Colonoscopy           History   Social History  . Marital Status: Married    Spouse Name: N/A  . Number of Children: 1  . Years of Education: N/A   Occupational History  . retired     Social History Main Topics  . Smoking status: Never Smoker   . Smokeless tobacco: Never Used  . Alcohol Use: 0.6 oz/week    1 Glasses of wine per week     Comment: OCCASIONALLY  . Drug Use: No  . Sexual Activity: No   Other Topics Concern  . Not on file   Social History Narrative   Born in [place], ,moved to the [place] in the 60s, live in [place] then in [place]   Lives w/ husband        Medication List       This list is accurate as of: 07/20/14 10:09 AM.  Always use your most recent med list.               ALPRAZolam 0.25 MG tablet  Commonly known as:  XANAX  TAKE ONE TABLET BY MOUTH THREE TIMES DAILY AS NEEDED     cholecalciferol 1000 UNITS tablet  Commonly known as:  VITAMIN D  Take 1,000 Units by mouth daily.     glucose blood test strip  Commonly known as:  ONE TOUCH ULTRA TEST  USE ONE STRIP TO CHECK GLUCOSE 2 TIMES DAILY ONLY.     glyBURIDE 2.5  MG tablet  Commonly known as:  DIABETA  Take 1 tablet (2.5 mg total) by mouth daily with breakfast.     metFORMIN 500 MG tablet  Commonly known as:  GLUCOPHAGE  Take 1 tablet (500 mg total) by mouth 2 (two) times daily with a meal.     onetouch ultrasoft lancets  1 each by Other route daily. Use as instructed     spironolactone 25 MG tablet  Commonly known as:  ALDACTONE  Take half a tablet daily           Objective:   Physical Exam BP 122/62 mmHg  Pulse 82  Temp(Src) 98.1 F (36.7 C) (Oral)  Ht 5' 1.25" (1.556 m)  Wt 151 lb 6 oz (68.663 kg)  BMI 28.36 kg/m2  SpO2 97%  LMP 04/17/1988       Assessment & Plan:

## 2014-07-20 NOTE — Assessment & Plan Note (Addendum)
Patient prescribed spironolactone for hirsutism by gynecology, not for essential hypertension. Ionized calcium and potassium are within normal limits.  Plan: Okay to start spironolactone Recheck calcium and potassium in one month to check for electrolyte disturbances.

## 2014-07-20 NOTE — Assessment & Plan Note (Addendum)
Patient complained of pruritic eyes with no drainage or inflammation. Accompanied by headaches. Not currently on medications except for daily saline rinse. Denies congestion, rhinorrhea, sore throat.  Plan: Claritin prn Flonase prn Artificial tears prn

## 2014-07-20 NOTE — Assessment & Plan Note (Signed)
Patient was concerned regarding mildly elevated A1c of 7.0% Plan: We counseled her on the importance of avoiding hypoglycemic episodes Continue current regimen.

## 2014-07-20 NOTE — Assessment & Plan Note (Addendum)
Patient expressed concern about recent episodes of diverticulitis  Current guidelines don't  recommend any change in diet, for now recommend observation. Patient instructed to contact us in the event of future attacks.

## 2014-07-28 DIAGNOSIS — L821 Other seborrheic keratosis: Secondary | ICD-10-CM | POA: Diagnosis not present

## 2014-07-28 DIAGNOSIS — D2272 Melanocytic nevi of left lower limb, including hip: Secondary | ICD-10-CM | POA: Diagnosis not present

## 2014-07-28 DIAGNOSIS — D220 Melanocytic nevi of lip: Secondary | ICD-10-CM | POA: Diagnosis not present

## 2014-07-28 DIAGNOSIS — Z85828 Personal history of other malignant neoplasm of skin: Secondary | ICD-10-CM | POA: Diagnosis not present

## 2014-07-28 DIAGNOSIS — D2239 Melanocytic nevi of other parts of face: Secondary | ICD-10-CM | POA: Diagnosis not present

## 2014-08-14 ENCOUNTER — Other Ambulatory Visit (INDEPENDENT_AMBULATORY_CARE_PROVIDER_SITE_OTHER): Payer: Medicare Other

## 2014-08-14 DIAGNOSIS — I10 Essential (primary) hypertension: Secondary | ICD-10-CM

## 2014-08-14 LAB — BASIC METABOLIC PANEL
BUN: 21 mg/dL (ref 6–23)
CO2: 30 mEq/L (ref 19–32)
Calcium: 10.3 mg/dL (ref 8.4–10.5)
Chloride: 102 mEq/L (ref 96–112)
Creatinine, Ser: 1 mg/dL (ref 0.40–1.20)
GFR: 56.91 mL/min — ABNORMAL LOW (ref >60.00)
Glucose, Bld: 147 mg/dL — ABNORMAL HIGH (ref 70–99)
Potassium: 5 mEq/L (ref 3.5–5.1)
Sodium: 137 mEq/L (ref 135–145)

## 2014-08-14 LAB — CALCIUM, IONIZED: Calcium, Ion: 1.33 mmol/L — ABNORMAL HIGH (ref 1.12–1.32)

## 2014-08-24 NOTE — Telephone Encounter (Signed)
Pt returning your call requested a call back after 3pm. Thank you

## 2014-08-24 NOTE — Telephone Encounter (Signed)
I do not remember calling her, as well as no note in system.

## 2014-09-15 DIAGNOSIS — H25813 Combined forms of age-related cataract, bilateral: Secondary | ICD-10-CM | POA: Diagnosis not present

## 2014-09-15 DIAGNOSIS — E119 Type 2 diabetes mellitus without complications: Secondary | ICD-10-CM | POA: Diagnosis not present

## 2014-09-15 DIAGNOSIS — H43811 Vitreous degeneration, right eye: Secondary | ICD-10-CM | POA: Diagnosis not present

## 2014-09-15 DIAGNOSIS — H40003 Preglaucoma, unspecified, bilateral: Secondary | ICD-10-CM | POA: Diagnosis not present

## 2014-09-15 DIAGNOSIS — H527 Unspecified disorder of refraction: Secondary | ICD-10-CM | POA: Diagnosis not present

## 2014-09-16 HISTORY — PX: CATARACT EXTRACTION: SUR2

## 2014-09-16 LAB — HM DIABETES EYE EXAM

## 2014-09-25 DIAGNOSIS — H527 Unspecified disorder of refraction: Secondary | ICD-10-CM | POA: Diagnosis not present

## 2014-09-25 DIAGNOSIS — H25813 Combined forms of age-related cataract, bilateral: Secondary | ICD-10-CM | POA: Diagnosis not present

## 2014-10-01 DIAGNOSIS — E119 Type 2 diabetes mellitus without complications: Secondary | ICD-10-CM | POA: Diagnosis not present

## 2014-10-01 DIAGNOSIS — M1990 Unspecified osteoarthritis, unspecified site: Secondary | ICD-10-CM | POA: Diagnosis not present

## 2014-10-01 DIAGNOSIS — H43811 Vitreous degeneration, right eye: Secondary | ICD-10-CM | POA: Diagnosis not present

## 2014-10-01 DIAGNOSIS — H527 Unspecified disorder of refraction: Secondary | ICD-10-CM | POA: Diagnosis not present

## 2014-10-01 DIAGNOSIS — H25819 Combined forms of age-related cataract, unspecified eye: Secondary | ICD-10-CM | POA: Insufficient documentation

## 2014-10-01 DIAGNOSIS — I10 Essential (primary) hypertension: Secondary | ICD-10-CM | POA: Diagnosis not present

## 2014-10-01 DIAGNOSIS — H40003 Preglaucoma, unspecified, bilateral: Secondary | ICD-10-CM | POA: Diagnosis not present

## 2014-10-01 DIAGNOSIS — H25813 Combined forms of age-related cataract, bilateral: Secondary | ICD-10-CM | POA: Diagnosis not present

## 2014-10-01 DIAGNOSIS — H25812 Combined forms of age-related cataract, left eye: Secondary | ICD-10-CM | POA: Diagnosis not present

## 2014-10-30 ENCOUNTER — Encounter: Payer: Self-pay | Admitting: Gastroenterology

## 2014-10-30 ENCOUNTER — Encounter: Payer: Self-pay | Admitting: Internal Medicine

## 2014-12-03 ENCOUNTER — Telehealth: Payer: Self-pay | Admitting: Internal Medicine

## 2014-12-03 NOTE — Telephone Encounter (Signed)
pre visit letter mailed 11/23/14

## 2014-12-11 ENCOUNTER — Telehealth: Payer: Self-pay | Admitting: Behavioral Health

## 2014-12-11 NOTE — Telephone Encounter (Signed)
Left message to confirm appointment for 12/14/14 at 10:00 AM.

## 2014-12-14 ENCOUNTER — Encounter: Payer: Self-pay | Admitting: Internal Medicine

## 2014-12-14 ENCOUNTER — Ambulatory Visit (INDEPENDENT_AMBULATORY_CARE_PROVIDER_SITE_OTHER): Payer: Medicare Other | Admitting: Internal Medicine

## 2014-12-14 VITALS — BP 130/64 | HR 84 | Temp 97.8°F | Ht 61.0 in | Wt 150.5 lb

## 2014-12-14 DIAGNOSIS — E119 Type 2 diabetes mellitus without complications: Secondary | ICD-10-CM

## 2014-12-14 DIAGNOSIS — R748 Abnormal levels of other serum enzymes: Secondary | ICD-10-CM

## 2014-12-14 DIAGNOSIS — M159 Polyosteoarthritis, unspecified: Secondary | ICD-10-CM

## 2014-12-14 DIAGNOSIS — R1084 Generalized abdominal pain: Secondary | ICD-10-CM

## 2014-12-14 DIAGNOSIS — Z0000 Encounter for general adult medical examination without abnormal findings: Secondary | ICD-10-CM | POA: Diagnosis not present

## 2014-12-14 DIAGNOSIS — R7989 Other specified abnormal findings of blood chemistry: Secondary | ICD-10-CM

## 2014-12-14 DIAGNOSIS — M1990 Unspecified osteoarthritis, unspecified site: Secondary | ICD-10-CM | POA: Insufficient documentation

## 2014-12-14 DIAGNOSIS — K5730 Diverticulosis of large intestine without perforation or abscess without bleeding: Secondary | ICD-10-CM

## 2014-12-14 DIAGNOSIS — M150 Primary generalized (osteo)arthritis: Secondary | ICD-10-CM

## 2014-12-14 DIAGNOSIS — M8949 Other hypertrophic osteoarthropathy, multiple sites: Secondary | ICD-10-CM

## 2014-12-14 DIAGNOSIS — I10 Essential (primary) hypertension: Secondary | ICD-10-CM

## 2014-12-14 HISTORY — DX: Unspecified osteoarthritis, unspecified site: M19.90

## 2014-12-14 LAB — HM DIABETES FOOT EXAM: HM Diabetic Foot Exam: NORMAL

## 2014-12-14 NOTE — Assessment & Plan Note (Addendum)
In addition to knee pain, she also complains about muscle aches and the shoulders and arms and occasionally sharp pain at the L neck No headaches, fever or chills. Will check a sedimentation rate, total CK, ANA and rheumatoid factor to rule out autoimmune processes. Also total CK. If  workup negative pain will be likely a DJD with a question of L cervical  radiculopathy. Recommend to see ortho (dr Nelva Bush)

## 2014-12-14 NOTE — Patient Instructions (Signed)
Please schedule labs to be done within few days (fasting)    Next visit  for a checkup in 2 months   ( 30 minutes) Please schedule an appointment at the front desk   Fasting is optional     Fall Prevention and [place] cause injuries and can affect all age groups. It is possible to use preventive measures to significantly decrease the likelihood of falls. There are many simple measures which can make your home safer and prevent falls. OUTDOORS  Repair cracks and edges of walkways and driveways.  Remove high doorway thresholds.  Trim shrubbery on the main path into your home.  Have good outside lighting.  Clear walkways of tools, rocks, debris, and clutter.  Check that handrails are not broken and are securely fastened. Both sides of steps should have handrails.  Have leaves, snow, and ice cleared regularly.  Use sand or salt on walkways during winter months.  In the garage, clean up grease or oil spills. BATHROOM  Install night lights.  Install grab bars by the toilet and in the tub and shower.  Use non-skid mats or decals in the tub or shower.  Place a plastic non-slip stool in the shower to sit on, if needed.  Keep floors dry and clean up all water on the floor immediately.  Remove soap buildup in the tub or shower on a regular basis.  Secure bath mats with non-slip, double-sided rug tape.  Remove throw rugs and tripping hazards from the floors. BEDROOMS  Install night lights.  Make sure a bedside light is easy to reach.  Do not use oversized bedding.  Keep a telephone by your bedside.  Have a firm chair with side arms to use for getting dressed.  Remove throw rugs and tripping hazards from the floor. KITCHEN  Keep handles on pots and pans turned toward the center of the stove. Use back burners when possible.  Clean up spills quickly and allow time for drying.  Avoid walking on wet floors.  Avoid hot utensils and  knives.  Position shelves so they are not too high or low.  Place commonly used objects within easy reach.  If necessary, use a sturdy step stool with a grab bar when reaching.  Keep electrical cables out of the way.  Do not use floor polish or wax that makes floors slippery. If you must use wax, use non-skid floor wax.  Remove throw rugs and tripping hazards from the floor. STAIRWAYS  Never leave objects on stairs.  Place handrails on both sides of stairways and use them. Fix any loose handrails. Make sure handrails on both sides of the stairways are as long as the stairs.  Check carpeting to make sure it is firmly attached along stairs. Make repairs to worn or loose carpet promptly.  Avoid placing throw rugs at the top or bottom of stairways, or properly secure the rug with carpet tape to prevent slippage. Get rid of throw rugs, if possible.  Have an electrician put in a light switch at the top and bottom of the stairs. OTHER FALL PREVENTION TIPS  Wear low-heel or rubber-soled shoes that are supportive and fit well. Wear closed toe shoes.  When using a stepladder, make sure it is fully opened and both spreaders are firmly locked. Do not climb a closed stepladder.  Add color or contrast paint or tape to grab bars and handrails in your home. Place contrasting color strips on first and last steps.  Learn  and use mobility aids as needed. Install an electrical emergency response system.  Turn on lights to avoid dark areas. Replace light bulbs that burn out immediately. Get light switches that glow.  Arrange furniture to create clear pathways. Keep furniture in the same place.  Firmly attach carpet with non-skid or double-sided tape.  Eliminate uneven floor surfaces.  Select a carpet pattern that does not visually hide the edge of steps.  Be aware of all pets. OTHER HOME SAFETY TIPS  Set the water temperature for 120 F (48.8 C).  Keep emergency numbers on or near the  telephone.  Keep smoke detectors on every level of the home and near sleeping areas. Document Released: 03/24/2002 Document Revised: 10/03/2011 Document Reviewed: 06/23/2011 [facility] Patient Information 2015 [place], [place]. This information is not intended to replace advice given to you by your health care provider. Make sure you discuss any questions you have with your health care provider.   Preventive Care for Adults Ages 50 and over  Blood pressure check.** / Every 1 to 2 years.  Lipid and cholesterol check.**/ Every 5 years beginning at age 8.  Lung cancer screening. / Every year if you are aged 63-80 years and have a 30-pack-year history of smoking and currently smoke or have quit within the past 15 years. Yearly screening is stopped once you have quit smoking for at least 15 years or develop a health problem that would prevent you from having lung cancer treatment.  Fecal occult blood test (FOBT) of stool. / Every year beginning at age 31 and continuing until age 24. You may not have to do this test if you get a colonoscopy every 10 years.  Flexible sigmoidoscopy** or colonoscopy.** / Every 5 years for a flexible sigmoidoscopy or every 10 years for a colonoscopy beginning at age 52 and continuing until age 50.  Hepatitis C blood test.** / For all people born from [birth year] through 1963 and any individual with known risks for hepatitis C.  Abdominal aortic aneurysm (AAA) screening.** / A one-time screening for ages 50 to 67 years who are current or former smokers.  Skin self-exam. / Monthly.  Influenza vaccine. / Every year.  Tetanus, diphtheria, and acellular pertussis (Tdap/Td) vaccine.** / 1 dose of Td every 10 years.  Varicella vaccine.** / Consult your health care provider.  Zoster vaccine.** / 1 dose for adults aged 18 years or older.  Pneumococcal 13-valent conjugate (PCV13) vaccine.** / Consult your health care provider.  Pneumococcal polysaccharide (PPSV23) vaccine.**  / 1 dose for all adults aged 83 years and older.  Meningococcal vaccine.** / Consult your health care provider.  Hepatitis A vaccine.** / Consult your health care provider.  Hepatitis B vaccine.** / Consult your health care provider.  Haemophilus influenzae type b (Hib) vaccine.** / Consult your health care provider. **Family history and personal history of risk and conditions may change your health care provider's recommendations. Document Released: 05/30/2001 Document Revised: 04/08/2013 Document Reviewed: 08/29/2010 [facility] Patient Information 2015 [place], [place]. This information is not intended to replace advice given to you by your health care provider. Make sure you discuss any questions you have with your health care provider.

## 2014-12-14 NOTE — Assessment & Plan Note (Signed)
On no medications, check a BMP

## 2014-12-14 NOTE — Assessment & Plan Note (Signed)
Check labs 

## 2014-12-14 NOTE — Assessment & Plan Note (Addendum)
Left lower abdominal pain for one day, exam benign, early diverticulitis? Recommend observation for now, knows to call if sx increase or not self-resolve

## 2014-12-14 NOTE — Progress Notes (Signed)
Pre visit review using our clinic review tool, if applicable. No additional management support is needed unless otherwise documented below in the visit note. 

## 2014-12-14 NOTE — Assessment & Plan Note (Signed)
On no medications, check FLP

## 2014-12-14 NOTE — Progress Notes (Signed)
Subjective:    Patient ID: Patricia Moore, female    DOB: February 17, 1934, 81 y.o.   MRN: [id number]  DOS:  12/14/2014 Type of visit - description : CPX, has multiple complaints/concerns Here for Medicare AWV:  1. Risk factors based on Past M, S, F history: reviewed 2. Physical Activities:  Active, walks 3 miles most days 3. Depression/mood: neg screening  4. Hearing:  No problems noted or reported  5. ADL's: independent, drives  6. Fall Risk: no recent falls, prevention discussed , see AVS 7. home Safety: does feel safe at home  8. Height, weight, & visual acuity: see VS, sees eye doctor regularly, cataracts L 09-2014 9. Counseling: provided 10. Labs ordered based on risk factors: if needed  11. Referral Coordination: if needed 12. Care Plan, see assessment and plan , written personalized plan provided , see AVS 13. Cognitive Assessment: motor skills and cognition appropriate for age 40. Care team updated  15. End-of-life care discussed >>> HC-POA  In addition, today we discussed the following: Diabetes: Good compliance of medication, due for a A1c Hypertension: Currently taking no medication. BP today is very good   Review of Systems Constitutional: No fever. No chills. No unexplained wt changes. No unusual sweats  HEENT: No dental problems, no ear discharge, no facial swelling, no voice changes. No eye discharge, no eye  redness , no  intolerance to light   Respiratory: No wheezing , no  difficulty breathing. No cough , no mucus production  Cardiovascular: No CP, no palpitations, occasional lower extremity edema No diarrhea  started with mild left lower abdominal discomfort yesterday., No fever chills. No dysuria gross hematuria  No dysphagia, no odynophagia    Endocrine: No polyphagia, no polyuria , no polydipsia  GU: No dysuria, gross hematuria, difficulty urinating. No urinary urgency, no frequency.  Musculoskeletal:   has chronic knee pain on and off. Also has pain  around the shoulder muscles, more noticeable on the left, sometimes has a sharp pain at the left shoulder but goes up to the neck. Also ? Myalgias at the arms. No headache per se, no weight loss. Does have pain at all the joints on the hands early in the mornings   Skin: No change in the color of the skin, palor , no  Rash  Allergic, immunologic: No environmental allergies , no  food allergies  Neurological: No dizziness no  syncope. No headaches. No diplopia, no slurred, no slurred speech, no motor deficits, no facial  Numbness  Hematological: No enlarged lymph nodes, no easy bruising , no unusual bleedings  Psychiatry: No suicidal ideas, no hallucinations, no beavior problems, no confusion.  No unusual/severe anxiety, no depression   Past Medical History  Diagnosis Date  . Allergy   . Hypertension   . Osteoporosis   . Diabetes mellitus   . Back pain   . Arthritis   . Hx of adenomatous colonic polyps   . Diverticulosis   . Hyperlipidemia   . H. pylori infection 2001  . Intraductal papilloma     bx nef x 2 in the 70s-90s  . SCC (squamous cell carcinoma) 03/2011    sees dermatology  . Diverticulitis     patient states has had three attacks   . Anxiety 12/09/2013  . DJD (degenerative joint disease) 12/14/2014    Past Surgical History  Procedure Laterality Date  . Breast biopsy  '91, '95    INTRADUCTAL PAPILLOMA  . Breast lumpectomy    . Esophagogastroduodenoscopy    .  Colonoscopy         . Cataract extraction Left 09-2014    Social History   Social History  . Marital Status: Married    Spouse Name: N/A  . Number of Children: 1  . Years of Education: N/A   Occupational History  . retired     Social History Main Topics  . Smoking status: Never Smoker   . Smokeless tobacco: Never Used  . Alcohol Use: 0.6 oz/week    1 Glasses of wine per week     Comment: OCCASIONALLY  . Drug Use: No  . Sexual Activity: No   Other Topics Concern  . Not on file   Social  History Narrative   Born in [place], ,moved to the [place] in the 60s, live in [place] then in [place]   Lives w/ husband    Family History  Problem Relation Age of Onset  . Adopted: Yes  . Hypertension Neg Hx   . Diabetes Neg Hx   . Heart disease Mother 67  . Heart disease Father 64  . Breast cancer Maternal Aunt   . Stroke Maternal Aunt   . Colon cancer Paternal Uncle     7 uncles  . Colon cancer Maternal Grandfather     COLON,, FAMILY HX 7 MEMBERS  . Breast cancer Maternal Aunt   . Cancer Sister     skin         Medication List       This list is accurate as of: 12/14/14  3:05 PM.  Always use your most recent med list.               ALPRAZolam 0.25 MG tablet  Commonly known as:  XANAX  TAKE ONE TABLET BY MOUTH THREE TIMES DAILY AS NEEDED     cholecalciferol 1000 UNITS tablet  Commonly known as:  VITAMIN D  Take 1,000 Units by mouth daily.     glucose blood test strip  Commonly known as:  ONE TOUCH ULTRA TEST  USE ONE STRIP TO CHECK GLUCOSE 2 TIMES DAILY ONLY.     glyBURIDE 2.5 MG tablet  Commonly known as:  DIABETA  Take 1 tablet (2.5 mg total) by mouth daily with breakfast.     metFORMIN 500 MG tablet  Commonly known as:  GLUCOPHAGE  Take 1 tablet (500 mg total) by mouth 2 (two) times daily with a meal.     onetouch ultrasoft lancets  1 each by Other route daily. Use as instructed           Objective:   Physical Exam BP 130/64 mmHg  Pulse 84  Temp(Src) 97.8 F (36.6 C) (Oral)  Ht 5\' 1"  (1.549 m)  Wt 150 lb 8 oz (68.266 kg)  BMI 28.45 kg/m2  SpO2 95%  LMP 04/17/1988 General:   Well developed, well nourished . NAD.  Neck:  Full range of motion. Supple. No  thyromegaly , normal carotid pulse HEENT:  Normocephalic . Face symmetric, atraumatic Lungs:  CTA B Normal respiratory effort, no intercostal retractions, no accessory muscle use. Heart: RRR,  no murmur.  No pretibial edema bilaterally  Abdomen:  Not distended, soft, minimal tenderness on the  left lower abdomen without mass or rebound. Good bowel sounds. MSK: Hands without synovitis. Diabetic foot exam: Normal, pinprick examination normal Skin: Exposed areas without rash. Not pale. Not jaundice Neurologic:  alert & oriented X3.  Speech normal, gait appropriate for age and unassisted Strength symmetric and appropriate for age.  Psych: Cognition and judgment appear intact.  Cooperative with normal attention span and concentration.  Behavior appropriate. No anxious or depressed appearing.    Assessment & Plan:

## 2014-12-14 NOTE — Assessment & Plan Note (Addendum)
Tetanus shot 2007; Pneumonia shot 2014; prevnar--Declined again today zostavax-- 2011 CCS: Last colonoscopy 10-2013, + polyps, next per GI Gyn-- Dr Toney Rakes ([facility], DEXA, does not need PAPs per pt)

## 2014-12-14 NOTE — Assessment & Plan Note (Addendum)
Good compliance with medication, check A1c. Feet exam negative today.

## 2014-12-15 ENCOUNTER — Ambulatory Visit (INDEPENDENT_AMBULATORY_CARE_PROVIDER_SITE_OTHER): Payer: Medicare Other

## 2014-12-15 ENCOUNTER — Telehealth: Payer: Self-pay

## 2014-12-15 ENCOUNTER — Other Ambulatory Visit (INDEPENDENT_AMBULATORY_CARE_PROVIDER_SITE_OTHER): Payer: Medicare Other

## 2014-12-15 ENCOUNTER — Other Ambulatory Visit: Payer: Self-pay

## 2014-12-15 DIAGNOSIS — I10 Essential (primary) hypertension: Secondary | ICD-10-CM | POA: Diagnosis not present

## 2014-12-15 DIAGNOSIS — E119 Type 2 diabetes mellitus without complications: Secondary | ICD-10-CM

## 2014-12-15 DIAGNOSIS — R748 Abnormal levels of other serum enzymes: Secondary | ICD-10-CM

## 2014-12-15 DIAGNOSIS — R1084 Generalized abdominal pain: Secondary | ICD-10-CM

## 2014-12-15 DIAGNOSIS — Z23 Encounter for immunization: Secondary | ICD-10-CM

## 2014-12-15 DIAGNOSIS — R109 Unspecified abdominal pain: Secondary | ICD-10-CM

## 2014-12-15 DIAGNOSIS — R749 Abnormal serum enzyme level, unspecified: Secondary | ICD-10-CM | POA: Diagnosis not present

## 2014-12-15 DIAGNOSIS — R7989 Other specified abnormal findings of blood chemistry: Secondary | ICD-10-CM

## 2014-12-15 DIAGNOSIS — M159 Polyosteoarthritis, unspecified: Secondary | ICD-10-CM | POA: Diagnosis not present

## 2014-12-15 LAB — URINALYSIS, ROUTINE W REFLEX MICROSCOPIC
Bilirubin Urine: NEGATIVE
Ketones, ur: NEGATIVE
Leukocytes, UA: NEGATIVE
Nitrite: NEGATIVE
Specific Gravity, Urine: 1.01 (ref 1.000–1.030)
Total Protein, Urine: NEGATIVE
Urine Glucose: NEGATIVE
Urobilinogen, UA: 0.2 (ref 0.0–1.0)
WBC, UA: NONE SEEN (ref 0–?)
pH: 8 (ref 5.0–8.0)

## 2014-12-15 LAB — HEMOGLOBIN A1C: Hgb A1c MFr Bld: 7 % — ABNORMAL HIGH (ref 4.6–6.5)

## 2014-12-15 LAB — LIPID PANEL
Cholesterol: 166 mg/dL (ref 0–200)
HDL: 50 mg/dL (ref >39.00)
LDL Cholesterol: 98 mg/dL (ref 0–99)
NonHDL: 116.29
Total CHOL/HDL Ratio: 3
Triglycerides: 89 mg/dL (ref 0.0–149.0)
VLDL: 17.8 mg/dL (ref 0.0–40.0)

## 2014-12-15 LAB — BASIC METABOLIC PANEL
BUN: 16 mg/dL (ref 6–23)
CO2: 31 mEq/L (ref 19–32)
Calcium: 9.9 mg/dL (ref 8.4–10.5)
Chloride: 101 mEq/L (ref 96–112)
Creatinine, Ser: 0.91 mg/dL (ref 0.40–1.20)
GFR: 63.39 mL/min (ref >60.00)
Glucose, Bld: 143 mg/dL — ABNORMAL HIGH (ref 70–99)
Potassium: 4.4 mEq/L (ref 3.5–5.1)
Sodium: 137 mEq/L (ref 135–145)

## 2014-12-15 LAB — C-REACTIVE PROTEIN: CRP: 7.1 mg/dL (ref 0.5–20.0)

## 2014-12-15 LAB — AST: AST: 20 U/L (ref 0–37)

## 2014-12-15 LAB — ALT: ALT: 15 U/L (ref 0–35)

## 2014-12-15 LAB — SEDIMENTATION RATE: Sed Rate: 65 mm/hr — ABNORMAL HIGH (ref 0–22)

## 2014-12-15 MED ORDER — ONETOUCH ULTRASOFT LANCETS MISC: Status: DC

## 2014-12-15 NOTE — Telephone Encounter (Signed)
CBC added, and CT of abdomen/pelvis ordered, instructed schedulers to wait for BMP results before scheduling.

## 2014-12-15 NOTE — Telephone Encounter (Signed)
Pt was seen yesterday for CPE. She joined her husband today for his CPE and wanted to inform Dr. Larose Kells that the abdominal pain on her left side is worse today, and wanted to know if she should have a CT or ultrasound completed. She does have a hx of diverticulitis. She is also requesting to have her Flu shot today. Informed her that I would let Dr. Larose Kells know of symptoms and flu shot request.

## 2014-12-15 NOTE — Telephone Encounter (Signed)
Pt requested refill on lancets. Rx sent for 1 year.

## 2014-12-15 NOTE — Progress Notes (Signed)
Pt tolerated injection well

## 2014-12-15 NOTE — Telephone Encounter (Signed)
Patient is here with her husband, to get a flu shot and labs. Reports that she continue with abdominal pain, slightly worse. No fever, chills. Mild nausea, constipated for 2 days. Since she was here, I check abdomen: Soft, good bowel sounds, not distended, mild to moderate tenderness in the left lower quadrant without mass or rebound. We just got a BMP,  ucx, UA and urine culture.  Plan:  add a  CBC.  Order a CT abdomen-pelvis with, dx LLQ adb pain (wait for BMP to come back )

## 2014-12-15 NOTE — Progress Notes (Signed)
Pre visit review using our clinic review tool, if applicable. No additional management support is needed unless otherwise documented below in the visit note. 

## 2014-12-16 ENCOUNTER — Ambulatory Visit (HOSPITAL_BASED_OUTPATIENT_CLINIC_OR_DEPARTMENT_OTHER)
Admission: RE | Admit: 2014-12-16 | Discharge: 2014-12-16 | Disposition: A | Discharge status: Home / Self Care | Payer: Medicare Other | Source: Ambulatory Visit | Attending: Internal Medicine | Admitting: Internal Medicine

## 2014-12-16 ENCOUNTER — Encounter (HOSPITAL_BASED_OUTPATIENT_CLINIC_OR_DEPARTMENT_OTHER): Payer: Self-pay

## 2014-12-16 ENCOUNTER — Telehealth: Payer: Self-pay | Admitting: Internal Medicine

## 2014-12-16 DIAGNOSIS — D259 Leiomyoma of uterus, unspecified: Secondary | ICD-10-CM | POA: Diagnosis not present

## 2014-12-16 DIAGNOSIS — R109 Unspecified abdominal pain: Secondary | ICD-10-CM

## 2014-12-16 DIAGNOSIS — K760 Fatty (change of) liver, not elsewhere classified: Secondary | ICD-10-CM | POA: Insufficient documentation

## 2014-12-16 DIAGNOSIS — K449 Diaphragmatic hernia without obstruction or gangrene: Secondary | ICD-10-CM | POA: Diagnosis not present

## 2014-12-16 DIAGNOSIS — K5732 Diverticulitis of large intestine without perforation or abscess without bleeding: Secondary | ICD-10-CM | POA: Insufficient documentation

## 2014-12-16 LAB — CK TOTAL AND CKMB (NOT AT ARMC)
CK, MB: 1.2 ng/mL (ref 0.0–5.0)
Relative Index: 1.2 (ref 0.0–4.0)
Total CK: 103 U/L (ref 7–177)

## 2014-12-16 LAB — ANA: Anti Nuclear Antibody(ANA): NEGATIVE

## 2014-12-16 LAB — RHEUMATOID FACTOR: Rhuematoid fact SerPl-aCnc: <10 IU/mL (ref <=14)

## 2014-12-16 LAB — HOMOCYSTEINE: Homocysteine: 9.2 umol/L (ref <10.4)

## 2014-12-16 MED ORDER — CIPROFLOXACIN HCL 500 MG PO TABS: 500.0000 mg | ORAL_TABLET | Freq: Two times a day (BID) | ORAL | Status: DC

## 2014-12-16 MED ORDER — IOHEXOL 300 MG/ML  SOLN
100.0000 mL | Freq: Once | INTRAMUSCULAR | Status: AC | PRN
  Administered 2014-12-16: 100 mL via INTRAVENOUS

## 2014-12-16 MED ORDER — METRONIDAZOLE 500 MG PO TABS: 500.0000 mg | ORAL_TABLET | Freq: Three times a day (TID) | ORAL | Status: DC

## 2014-12-16 NOTE — Telephone Encounter (Signed)
Relation to DH:DIXB Call back number: [phone]   Reason for call:  Patient would like to know instructions regarding her abdominal pain, patient requested to speak with nurse directly.

## 2014-12-16 NOTE — Telephone Encounter (Signed)
Pt calling regarding lab results and CT scan results. Please advise.

## 2014-12-16 NOTE — Telephone Encounter (Signed)
Diverticulitis: Start antibiotics, she will call if not improving quickly Diabetes stable with a A1c of 7.0. No change at this time She had myalgias, arthralgias, labs okay except sedimentation rate and CRP which are elevated likely from diverticulitis. Consider recheck the next time. Spoke with the patient, continue with abdominal pain, had fever last night, no nausea, vomiting. Good by mouth tolerance. Encourage to start antibiotics today and call me if she is not better or if she is getting worse. She usually respond well to antibiotics.

## 2014-12-17 LAB — URINE CULTURE
Colony Count: NO GROWTH
Organism ID, Bacteria: NO GROWTH

## 2014-12-17 NOTE — Telephone Encounter (Signed)
Already dicussed w/ pt

## 2014-12-24 ENCOUNTER — Telehealth: Payer: Self-pay | Admitting: Internal Medicine

## 2014-12-24 NOTE — Telephone Encounter (Signed)
Spoke with Pt, she informed me that she is no longer having abdominal pain, but has been experiencing some nausea and dizziness since starting antibiotics. Pt denies fever, chills, or vomiting. I informed her to let us know if nausea or dizziness continues or if abdominal pain returns or if she starts running a fever or having chills, diarrhea, or constipation. Pt verbalized understanding and thanked me for calling.

## 2014-12-24 NOTE — Telephone Encounter (Signed)
thx

## 2014-12-24 NOTE — Telephone Encounter (Signed)
Patient was diagnosed with diverticulitis, please call her and let me know how she is doing

## 2015-01-16 ENCOUNTER — Other Ambulatory Visit: Payer: Self-pay | Admitting: Internal Medicine

## 2015-01-18 DIAGNOSIS — L820 Inflamed seborrheic keratosis: Secondary | ICD-10-CM | POA: Diagnosis not present

## 2015-01-18 NOTE — Telephone Encounter (Signed)
Okay #30 and 5 refills 

## 2015-01-18 NOTE — Telephone Encounter (Signed)
Pt is requesting refill on Alprazolam.  Last OV: 12/14/2014 Last Fill: 07/07/2014 #30 5RF UDS: 06/04/2014 Low risk  Please advise.

## 2015-01-18 NOTE — Telephone Encounter (Signed)
Rx printed, awaiting MD signature.  

## 2015-01-18 NOTE — Telephone Encounter (Signed)
Rx faxed to [facility]  

## 2015-01-28 ENCOUNTER — Ambulatory Visit (INDEPENDENT_AMBULATORY_CARE_PROVIDER_SITE_OTHER): Payer: Medicare Other | Admitting: Internal Medicine

## 2015-01-28 ENCOUNTER — Encounter: Payer: Self-pay | Admitting: Internal Medicine

## 2015-01-28 VITALS — BP 132/70 | HR 78 | Temp 97.7°F | Ht 61.0 in | Wt 147.4 lb

## 2015-01-28 DIAGNOSIS — L989 Disorder of the skin and subcutaneous tissue, unspecified: Secondary | ICD-10-CM | POA: Diagnosis not present

## 2015-01-28 DIAGNOSIS — Z09 Encounter for follow-up examination after completed treatment for conditions other than malignant neoplasm: Secondary | ICD-10-CM

## 2015-01-28 MED ORDER — METFORMIN HCL 500 MG PO TABS: 500.0000 mg | ORAL_TABLET | Freq: Two times a day (BID) | ORAL | Status: DC

## 2015-01-28 NOTE — Progress Notes (Signed)
Pre visit review using our clinic review tool, if applicable. No additional management support is needed unless otherwise documented below in the visit note. 

## 2015-01-28 NOTE — Assessment & Plan Note (Signed)
Skin lesion:, s/p cryotherapy, some irritation but no evidence of infection, recommend to see Dr. Ronnald Ramp, states she will call. Recent FLP, sed rate and A1c discussed with the patient. Good results.

## 2015-01-28 NOTE — Patient Instructions (Signed)
Please call DR Ronnald Ramp

## 2015-01-28 NOTE — Progress Notes (Signed)
Subjective:    Patient ID: Patricia Moore, female    DOB: 1934/03/03, 81 y.o.   MRN: [id number]  DOS:  01/28/2015 Type of visit - description : Acute visit Interval history: 2 weeks ago Dr. Yetta Barre dermatology did cryotherapy on a large skin lesion on the left. Area seems irritated and is itching. Likes to discuss her most recent labs   Review of Systems   Past Medical History  Diagnosis Date  . Allergy   . Hypertension   . Osteoporosis   . Diabetes mellitus   . Back pain   . Arthritis   . Hx of adenomatous colonic polyps   . Diverticulosis   . Hyperlipidemia   . H. pylori infection 2001  . Intraductal papilloma     bx nef x 2 in the 70s-90s  . SCC (squamous cell carcinoma) 03/2011    sees dermatology  . Diverticulitis     patient states has had three attacks   . Anxiety 12/09/2013  . DJD (degenerative joint disease) 12/14/2014    Past Surgical History  Procedure Laterality Date  . Breast biopsy  '91, '95    INTRADUCTAL PAPILLOMA  . Breast lumpectomy    . Esophagogastroduodenoscopy    . Colonoscopy         . Cataract extraction Left 09-2014    Social History   Social History  . Marital Status: Married    Spouse Name: N/A  . Number of Children: 1  . Years of Education: N/A   Occupational History  . retired     Social History Main Topics  . Smoking status: Never Smoker   . Smokeless tobacco: Never Used  . Alcohol Use: 0.6 oz/week    1 Glasses of wine per week     Comment: OCCASIONALLY  . Drug Use: No  . Sexual Activity: No   Other Topics Concern  . Not on file   Social History Narrative   Born in [place], ,moved to the [place] in the 60s, live in [place] then in [place]   Lives w/ husband        Medication List       This list is accurate as of: 01/28/15  8:37 AM.  Always use your most recent med list.               ALPRAZolam 0.25 MG tablet  Commonly known as:  XANAX  Take 1 tablet (0.25 mg total) by mouth 3 (three) times daily as needed.     cholecalciferol 1000 UNITS tablet  Commonly known as:  VITAMIN D  Take 1,000 Units by mouth daily.     ciprofloxacin 500 MG tablet  Commonly known as:  CIPRO  Take 1 tablet (500 mg total) by mouth 2 (two) times daily.     glucose blood test strip  Commonly known as:  ONE TOUCH ULTRA TEST  Check blood sugar no more than twice daily.     glyBURIDE 2.5 MG tablet  Commonly known as:  DIABETA  Take 1 tablet (2.5 mg total) by mouth daily with breakfast.     metFORMIN 500 MG tablet  Commonly known as:  GLUCOPHAGE  Take 1 tablet (500 mg total) by mouth 2 (two) times daily with a meal.     metroNIDAZOLE 500 MG tablet  Commonly known as:  FLAGYL  Take 1 tablet (500 mg total) by mouth 3 (three) times daily.     onetouch ultrasoft lancets  Check blood sugar no more than  twice daily.           Objective:   Physical Exam  HENT:  Head:     BP 132/70 mmHg  Pulse 78  Temp(Src) 97.7 F (36.5 C) (Oral)  Ht 5\' 1"  (1.549 m)  Wt 147 lb 6 oz (66.849 kg)  BMI 27.86 kg/m2  SpO2 99%  LMP 04/17/1988     Assessment & Plan:   Assessment > DM HTN Hyperlipidemia Aanxiety GI: --Recurrent diverticulitis (one episode documented by a CT 11-2014) --colon polyps --H. pylori 2001 DJD--back pain, knees Osteoporosis SCC, skin cancer  Plan  Skin lesion:, s/p cryotherapy, some irritation but no evidence of infection, recommend to see Dr. Yetta Barre, states she will call. Recent FLP, sed rate and A1c discussed with the patient. Good results.

## 2015-02-15 ENCOUNTER — Ambulatory Visit: Payer: Self-pay | Admitting: Internal Medicine

## 2015-02-15 ENCOUNTER — Ambulatory Visit (HOSPITAL_BASED_OUTPATIENT_CLINIC_OR_DEPARTMENT_OTHER)
Admission: RE | Admit: 2015-02-15 | Discharge: 2015-02-15 | Disposition: A | Discharge status: Home / Self Care | Payer: Medicare Other | Source: Ambulatory Visit | Attending: Internal Medicine | Admitting: Internal Medicine

## 2015-02-15 ENCOUNTER — Encounter: Payer: Self-pay | Admitting: Internal Medicine

## 2015-02-15 ENCOUNTER — Ambulatory Visit (INDEPENDENT_AMBULATORY_CARE_PROVIDER_SITE_OTHER): Payer: Medicare Other | Admitting: Internal Medicine

## 2015-02-15 VITALS — BP 126/74 | HR 80 | Temp 97.7°F | Ht 61.0 in | Wt 152.0 lb

## 2015-02-15 DIAGNOSIS — M50321 Other cervical disc degeneration at C4-C5 level: Secondary | ICD-10-CM | POA: Insufficient documentation

## 2015-02-15 DIAGNOSIS — Z23 Encounter for immunization: Secondary | ICD-10-CM | POA: Diagnosis not present

## 2015-02-15 DIAGNOSIS — Z09 Encounter for follow-up examination after completed treatment for conditions other than malignant neoplasm: Secondary | ICD-10-CM

## 2015-02-15 DIAGNOSIS — R42 Dizziness and giddiness: Secondary | ICD-10-CM

## 2015-02-15 DIAGNOSIS — M542 Cervicalgia: Secondary | ICD-10-CM

## 2015-02-15 DIAGNOSIS — M5030 Other cervical disc degeneration, unspecified cervical region: Secondary | ICD-10-CM | POA: Diagnosis not present

## 2015-02-15 LAB — SEDIMENTATION RATE: Sed Rate: 38 mm/hr — ABNORMAL HIGH (ref 0–22)

## 2015-02-15 LAB — C-REACTIVE PROTEIN: CRP: 1.9 mg/dL (ref 0.5–20.0)

## 2015-02-15 NOTE — Progress Notes (Signed)
Pre visit review using our clinic review tool, if applicable. No additional management support is needed unless otherwise documented below in the visit note. 

## 2015-02-15 NOTE — Progress Notes (Signed)
Subjective:    Patient ID: Patricia Moore, female    DOB: 1933/09/19, 81 y.o.   MRN: [id number]  DOS:  02/15/2015 Type of visit - description : outine office visit, here with her daughter-in-law Harriett Sine Interval history: Diabetes: Good compliance of medication, ambulatory blood sugars in the 120s. She again complained of a "headache", the pain is actually located at the right side of the nuchal area and neck, is steady for the last few months, no radiation to the arm, no paresthesias, not necessarily worse by moving her neck. Currently taking Tylenol. Pain is worse at night. Also, had 2 episodes of dizziness  the last week.She felt extremely dizzy described as spinning for 3 or 4 minutes after she woke up, she was afraid to move so she's not sure if the head motion would affect the symptoms; dizziness resolved on its own within 4 minutes. No associated headache but she did have some nausea and no vomiting.   Review of Systems Denies chest pain or difficulty breathing. No palpitations. No slurred speech, motor difficulties orfacial numbness. Some visual disturbances, diplopia? "I felt so dizzy I think I was seen double".   Past Medical History  Diagnosis Date  . Allergy   . Hypertension   . Osteoporosis   . Diabetes mellitus   . Back pain   . Arthritis   . Hx of adenomatous colonic polyps   . Diverticulosis   . Hyperlipidemia   . H. pylori infection 2001  . Intraductal papilloma     bx nef x 2 in the 70s-90s  . SCC (squamous cell carcinoma) 03/2011    sees dermatology  . Diverticulitis     patient states has had three attacks   . Anxiety 12/09/2013  . DJD (degenerative joint disease) 12/14/2014    Past Surgical History  Procedure Laterality Date  . Breast biopsy  '91, '95    INTRADUCTAL PAPILLOMA  . Breast lumpectomy    . Esophagogastroduodenoscopy    . Colonoscopy         . Cataract extraction Left 09-2014    Social History   Social History  . Marital Status: Married     Spouse Name: N/A  . Number of Children: 1  . Years of Education: N/A   Occupational History  . retired     Social History Main Topics  . Smoking status: Never Smoker   . Smokeless tobacco: Never Used  . Alcohol Use: 0.6 oz/week    1 Glasses of wine per week     Comment: OCCASIONALLY  . Drug Use: No  . Sexual Activity: No   Other Topics Concern  . Not on file   Social History Narrative   Born in [place], ,moved to the [place] in the 60s, live in [place] then in [place]   Lives w/ husband        Medication List       This list is accurate as of: 02/15/15 11:59 PM.  Always use your most recent med list.               ALPRAZolam 0.25 MG tablet  Commonly known as:  XANAX  Take 1 tablet (0.25 mg total) by mouth 3 (three) times daily as needed.     cholecalciferol 1000 UNITS tablet  Commonly known as:  VITAMIN D  Take 1,000 Units by mouth daily.     glucose blood test strip  Commonly known as:  ONE TOUCH ULTRA TEST  Check blood sugar  no more than twice daily.     glyBURIDE 2.5 MG tablet  Commonly known as:  DIABETA  Take 1 tablet (2.5 mg total) by mouth daily with breakfast.     metFORMIN 500 MG tablet  Commonly known as:  GLUCOPHAGE  Take 1 tablet (500 mg total) by mouth 2 (two) times daily with a meal.     onetouch ultrasoft lancets  Check blood sugar no more than twice daily.           Objective:   Physical Exam  Neck:     BP 126/74 mmHg  Pulse 80  Temp(Src) 97.7 F (36.5 C) (Oral)  Ht 5\' 1"  (1.549 m)  Wt 152 lb (68.947 kg)  BMI 28.74 kg/m2  SpO2 98%  LMP 04/17/1988 General:   Well developed, well nourished . NAD.  HEENT:  Normocephalic . Face symmetric, atraumatic Neck: Full range of motion, no TTP at the cervical spine.normal carotid pulses. Lungs:  CTA B Normal respiratory effort, no intercostal retractions, no accessory muscle use. Heart: RRR,  no murmur.  No pretibial edema bilaterally  Skin: Not pale. Not jaundice Neurologic:  alert &  oriented X3.  Speech normal, gait appropriate for age and unassisted EOMI, pupils equal reactive Roemberg: present (felt slt dizzy) Psych--  Cognition and judgment appear intact.  Cooperative with normal attention span and concentration.  Behavior appropriate. No anxious or depressed appearing.      Assessment & Plan:   Assessment > DM HTN Hyperlipidemia Anxiety GI: --Recurrent diverticulitis (one episode documented by a CT 11-2014) --colon polyps --H. pylori 2001 DJD--back pain, knees Osteoporosis SCC, skin cancer  Plan  Neck pain: Occipital neuralgia? Will check a Sed rate, CRP, XR and referred to neurology, may benefit from a nerve block Dizziness   To neuro, ? roemberg present. Will obtain a MRI. History of a skin cancer: needs to see dermatology, states  plan to see Dr Yetta Barre

## 2015-02-15 NOTE — Patient Instructions (Signed)
Get your blood work before you leave      Next visit  for a   10 checkup in 4 months, no fasting (30 minutes) Please schedule an appointment at the front desk

## 2015-02-16 NOTE — Assessment & Plan Note (Signed)
Neck pain: Occipital neuralgia? Will check a Sed rate, CRP, XR and referred to neurology, may benefit from a nerve block Dizziness   To neuro, ? roemberg present. Will obtain a MRI. History of a skin cancer: needs to see dermatology, states  plan to see Dr Ronnald Ramp

## 2015-02-17 ENCOUNTER — Telehealth: Payer: Self-pay | Admitting: Internal Medicine

## 2015-02-17 NOTE — Telephone Encounter (Signed)
Dr Catalina Gravel 04/21/15 @10 :15, pt aware. Not happy with having to wait till 12/9 to see [facility]. She states she needs to be seen sooner. Requesting xray results.

## 2015-02-17 NOTE — Telephone Encounter (Signed)
Relation to OB:SJGG Call back [phone]   Reason for call:  Patient inquiring about DG X-RAY results.   Patient states [facility] cant see her until December therefore she would like to be referred to [facility]: Edison Nasuti MD [address] [place], [place], [place] [address] [phone]. Patient checked with insurance and there in network

## 2015-02-17 NOTE — Telephone Encounter (Signed)
Spoke with Pt, informed her of x-ray results. Informed her that we recommend to keep appt with [facility] on 03/26/2015. Offered to send Dr. Larose Kells a message in regards to medication for pain, which Pt declined. Pt states she will keep appt for 03/26/2015 and let us know if pain becomes worse.

## 2015-02-23 ENCOUNTER — Ambulatory Visit
Admission: RE | Admit: 2015-02-23 | Discharge: 2015-02-23 | Disposition: A | Discharge status: Home / Self Care | Payer: Medicare Other | Source: Ambulatory Visit | Attending: Internal Medicine | Admitting: Internal Medicine

## 2015-02-23 DIAGNOSIS — H532 Diplopia: Secondary | ICD-10-CM | POA: Diagnosis not present

## 2015-02-23 DIAGNOSIS — R42 Dizziness and giddiness: Secondary | ICD-10-CM | POA: Diagnosis not present

## 2015-03-05 DIAGNOSIS — Z85828 Personal history of other malignant neoplasm of skin: Secondary | ICD-10-CM | POA: Diagnosis not present

## 2015-03-05 DIAGNOSIS — L720 Epidermal cyst: Secondary | ICD-10-CM | POA: Diagnosis not present

## 2015-03-05 DIAGNOSIS — L820 Inflamed seborrheic keratosis: Secondary | ICD-10-CM | POA: Diagnosis not present

## 2015-03-26 ENCOUNTER — Other Ambulatory Visit (INDEPENDENT_AMBULATORY_CARE_PROVIDER_SITE_OTHER): Payer: Medicare Other

## 2015-03-26 ENCOUNTER — Ambulatory Visit: Payer: Medicare Other | Admitting: Neurology

## 2015-03-26 ENCOUNTER — Encounter: Payer: Self-pay | Admitting: Neurology

## 2015-03-26 VITALS — BP 146/82 | HR 84 | Ht 61.0 in | Wt 150.8 lb

## 2015-03-26 DIAGNOSIS — M5030 Other cervical disc degeneration, unspecified cervical region: Secondary | ICD-10-CM | POA: Diagnosis not present

## 2015-03-26 DIAGNOSIS — Q283 Other malformations of cerebral vessels: Secondary | ICD-10-CM

## 2015-03-26 DIAGNOSIS — I10 Essential (primary) hypertension: Secondary | ICD-10-CM

## 2015-03-26 DIAGNOSIS — R42 Dizziness and giddiness: Secondary | ICD-10-CM | POA: Diagnosis not present

## 2015-03-26 DIAGNOSIS — H532 Diplopia: Secondary | ICD-10-CM

## 2015-03-26 LAB — BASIC METABOLIC PANEL
BUN: 20 mg/dL (ref 6–23)
CO2: 32 mEq/L (ref 19–32)
Calcium: 10 mg/dL (ref 8.4–10.5)
Chloride: 101 mEq/L (ref 96–112)
Creatinine, Ser: 0.9 mg/dL (ref 0.40–1.20)
GFR: 64.16 mL/min (ref >60.00)
Glucose, Bld: 135 mg/dL — ABNORMAL HIGH (ref 70–99)
Potassium: 4.5 mEq/L (ref 3.5–5.1)
Sodium: 140 mEq/L (ref 135–145)

## 2015-03-26 MED ORDER — TIZANIDINE HCL 2 MG PO TABS
2.0000 mg | ORAL_TABLET | Freq: Three times a day (TID) | ORAL | Status: DC | PRN
Start: 2015-03-26 — End: 2015-04-16

## 2015-03-26 NOTE — Progress Notes (Signed)
NEUROLOGY CONSULTATION NOTE  Patricia Moore MRN: [id number] DOB: 1933/06/24  Referring provider: Dr. Drue Novel Primary care provider: Dr. Drue Novel  Reason for consult:  dizziness  HISTORY OF PRESENT ILLNESS: Patricia Moore is a 81 year old right-handed female with hypertension, hyperlipidemia, DJD and arthritis who presents for dizziness and neck pain.  History obtained by patient, husband and PCP note.  Imaging of brain MRI and cervical plain films reviewed.  Since June, she has been experiencing severe right-sided posterior neck pain that radiates down to the right shoulder.  It does not radiate into the arm.  She denies numbness or tingling of the right upper extremity or back of the head.  It is painful with neck movement.  Applying pressure is helpful.  Pain is worse later in the day.  Over the past couple of months, she had two spells of dizziness.  She woke up from sleep and noted double vision and severe spinning lasting a couple of minutes.  She just closed her eyes and was still until it resolved.  She denied slurred speech, focal numbness or weakness or headache.  She woke up on her back but reports that she turns side to side when she sleeps.  It happened one other time.  Since then, she still feels unsteady when she walks.  She has mild dizziness when she bends over.  She denies prior history of similar spells.  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  To evaluate neck pain, X-ray of cervical spine performed on 02/15/15 showed moderate multilevel degenerative disc and facet joint disease with straightening of the cervical lordosis, indicative of muscle spasm.  PAST MEDICAL HISTORY: Past Medical History  Diagnosis Date  . Allergy   . Hypertension   . Osteoporosis   . Diabetes mellitus   . Back pain   . Arthritis   . Hx of adenomatous colonic polyps   . Diverticulosis   . Hyperlipidemia   . H. pylori infection 2001  . Intraductal papilloma     bx nef x 2  in the 70s-90s  . SCC (squamous cell carcinoma) 03/2011    sees dermatology  . Diverticulitis     patient states has had three attacks   . Anxiety 12/09/2013  . DJD (degenerative joint disease) 12/14/2014    PAST SURGICAL HISTORY: Past Surgical History  Procedure Laterality Date  . Breast biopsy  '91, '95    INTRADUCTAL PAPILLOMA  . Breast lumpectomy    . Esophagogastroduodenoscopy    . Colonoscopy         . Cataract extraction Left 09-2014    MEDICATIONS: Current Outpatient Prescriptions on File Prior to Visit  Medication Sig Dispense Refill  . ALPRAZolam (XANAX) 0.25 MG tablet Take 1 tablet (0.25 mg total) by mouth 3 (three) times daily as needed. 30 tablet 5  . cholecalciferol (VITAMIN D) 1000 UNITS tablet Take 1,000 Units by mouth daily.    Marland Kitchen glyBURIDE (DIABETA) 2.5 MG tablet Take 1 tablet (2.5 mg total) by mouth daily with breakfast. 30 tablet 5  . metFORMIN (GLUCOPHAGE) 500 MG tablet Take 1 tablet (500 mg total) by mouth 2 (two) times daily with a meal. 180 tablet 2  . glucose blood (ONE TOUCH ULTRA TEST) test strip Check blood sugar no more than twice daily. (Patient not taking: Reported on 01/28/2015) 100 each 12  . Lancets (ONETOUCH ULTRASOFT) lancets Check blood sugar no more than twice daily. (Patient not taking: Reported on 01/28/2015)  100 each 12   No current facility-administered medications on file prior to visit.    ALLERGIES: No Known Allergies  FAMILY HISTORY: Family History  Problem Relation Age of Onset  . Adopted: Yes  . Hypertension Neg Hx   . Diabetes Neg Hx   . Heart disease Mother 62  . Heart disease Father 37  . Breast cancer Maternal Aunt   . Stroke Maternal Aunt   . Colon cancer Paternal Uncle     7 uncles  . Colon cancer Maternal Grandfather     COLON,, FAMILY HX 7 MEMBERS  . Breast cancer Maternal Aunt   . Cancer Sister     skin    SOCIAL HISTORY: Social History   Social History  . Marital Status: Married    Spouse Name: N/A  .  Number of Children: 1  . Years of Education: N/A   Occupational History  . retired     Social History Main Topics  . Smoking status: Never Smoker   . Smokeless tobacco: Never Used  . Alcohol Use: 0.6 oz/week    1 Glasses of wine per week     Comment: OCCASIONALLY  . Drug Use: No  . Sexual Activity: No   Other Topics Concern  . Not on file   Social History Narrative   Born in [place], ,moved to the [place] in the 60s, live in [place] then in [place]   Lives w/ husband   H.S. graduate    REVIEW OF SYSTEMS: Constitutional: No fevers, chills, or sweats, no generalized fatigue, change in appetite Eyes: No visual changes, double vision, eye pain Ear, nose and throat: No hearing loss, ear pain, nasal congestion, sore throat Cardiovascular: No chest pain, palpitations Respiratory:  No shortness of breath at rest or with exertion, wheezes GastrointestinaI: No nausea, vomiting, diarrhea, abdominal pain, fecal incontinence Genitourinary:  No dysuria, urinary retention or frequency Musculoskeletal:  No neck pain, back pain Integumentary: No rash, pruritus, skin lesions Neurological: as above Psychiatric: No depression, insomnia, anxiety Endocrine: No palpitations, fatigue, diaphoresis, mood swings, change in appetite, change in weight, increased thirst Hematologic/Lymphatic:  No anemia, purpura, petechiae. Allergic/Immunologic: no itchy/runny eyes, nasal congestion, recent allergic reactions, rashes  PHYSICAL EXAM: Filed Vitals:   03/26/15 1024  BP: 146/82  Pulse: 84   General: No acute distress.  Patient appears well-groomed.  Head:  Normocephalic/atraumatic Eyes:  fundi unremarkable, without vessel changes, exudates, hemorrhages or papilledema. Neck: supple, no paraspinal tenderness, full range of motion Back: No paraspinal tenderness Heart: regular rate and rhythm Lungs: Clear to auscultation bilaterally. Vascular: No carotid bruits. Neurological Exam: Mental status: alert and oriented  to person, place, and time, recent and remote memory intact, fund of knowledge intact, attention and concentration intact, speech fluent and not dysarthric, language intact. Cranial nerves: CN I: not tested CN II: pupils equal, round and reactive to light, visual fields intact, fundi unremarkable, without vessel changes, exudates, hemorrhages or papilledema. CN III, IV, VI:  full range of motion, no nystagmus, no ptosis CN V: facial sensation intact CN VII: upper and lower face symmetric CN VIII: hearing intact CN IX, X: gag intact, uvula midline CN XI: sternocleidomastoid and trapezius muscles intact CN XII: tongue midline Bulk & Tone: normal, no fasciculations. Motor:  5/5 throughout Sensation: temperature and vibration sensation intact. Deep Tendon Reflexes:  2+ throughout toes downgoing. Finger to nose testing:  Without dysmetria.  Heel to shin:  Without dysmetria.  Gait:  Normal station and stride.  Able to turn  and tandem walk. Romberg with sway.  IMPRESSION & PLAN: 1.  I suspect that the two dizzy spells were probably benign paroxysmal positional vertigo.  However, since it was associated with diplopia and she continues to feel off-balance after the spells, I would like to check an MRA of the head to look at the vertebrobasilar system.  I also advised to start ASA 81mg  daily.  Will call her with results and any further recommendations.  If unremarkable, then no follow up needed.  Consider vestibular rehab if dizziness persists. 2.  Neck pain.  Myofascial.  Provided tizanidine 2mg .  Cautioned about drowsiness and advised to take one at night first.  Also asked her to consider PT 3.  HTN.  Recheck BP with PCP  Thank you for allowing me to take part in the care of this patient.  Shon Millet, DO  CC:  Willow Ora, MD

## 2015-03-26 NOTE — Patient Instructions (Addendum)
I think you likely experienced typical vertigo, but to be sure, we will check MRA of the head to look at the blood vessels in the brain.   Consider physical therapy for the neck Take tizanidine 2mg  for neck pain.  May take every 8 hours as needed.  Caution for drowsiness.  Take first dose at bedtime first to make sure you tolerate it. Take aspirin 81mg  daily Will contact you with MRA results and further recommendations if needed.

## 2015-04-05 ENCOUNTER — Other Ambulatory Visit: Payer: Self-pay

## 2015-04-05 ENCOUNTER — Ambulatory Visit
Admission: RE | Admit: 2015-04-05 | Discharge: 2015-04-05 | Disposition: A | Discharge status: Home / Self Care | Payer: Medicare Other | Source: Ambulatory Visit | Attending: Neurology | Admitting: Neurology

## 2015-04-05 DIAGNOSIS — H532 Diplopia: Secondary | ICD-10-CM

## 2015-04-05 DIAGNOSIS — I671 Cerebral aneurysm, nonruptured: Secondary | ICD-10-CM | POA: Diagnosis not present

## 2015-04-05 DIAGNOSIS — Q283 Other malformations of cerebral vessels: Secondary | ICD-10-CM

## 2015-04-06 ENCOUNTER — Telehealth: Payer: Self-pay | Admitting: Neurology

## 2015-04-06 NOTE — Telephone Encounter (Signed)
I called and spoke with Mrs. Patricia Moore regarding MRA.  It does not reveal anything intracranially that would be causing her dizzy spells.  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  I don't think it is anything that needs to be taken care of surgically, although I offered to refer her to endovascular surgery.  Instead, I feel that monitoring and repeating MRA of head in one year would be appropriate.  She agrees with me.  This may be ordered by her PCP in one year.  Otherwise, we can order it in one year with follow up afterwards.

## 2015-04-15 ENCOUNTER — Encounter: Payer: Self-pay | Admitting: Internal Medicine

## 2015-04-15 ENCOUNTER — Ambulatory Visit (INDEPENDENT_AMBULATORY_CARE_PROVIDER_SITE_OTHER): Payer: Medicare Other | Admitting: Internal Medicine

## 2015-04-15 VITALS — BP 142/76 | HR 75 | Temp 98.0°F | Ht 61.0 in | Wt 150.5 lb

## 2015-04-15 DIAGNOSIS — R42 Dizziness and giddiness: Secondary | ICD-10-CM

## 2015-04-15 DIAGNOSIS — I671 Cerebral aneurysm, nonruptured: Secondary | ICD-10-CM

## 2015-04-15 DIAGNOSIS — M542 Cervicalgia: Secondary | ICD-10-CM | POA: Diagnosis not present

## 2015-04-15 DIAGNOSIS — I10 Essential (primary) hypertension: Secondary | ICD-10-CM | POA: Diagnosis not present

## 2015-04-15 DIAGNOSIS — Z09 Encounter for follow-up examination after completed treatment for conditions other than malignant neoplasm: Secondary | ICD-10-CM

## 2015-04-15 MED ORDER — LOSARTAN POTASSIUM-HCTZ 50-12.5 MG PO TABS: 1.0000 | ORAL_TABLET | Freq: Every day | ORAL | Status: DC

## 2015-04-15 MED ORDER — TRAMADOL HCL 50 MG PO TABS: 50.0000 mg | ORAL_TABLET | Freq: Two times a day (BID) | ORAL | Status: DC | PRN

## 2015-04-15 NOTE — Progress Notes (Signed)
Pre visit review using our clinic review tool, if applicable. No additional management support is needed unless otherwise documented below in the visit note. 

## 2015-04-15 NOTE — Patient Instructions (Addendum)
BEFORE YOU LEAVE THE OFFICE:  GO TO THE FRONT DESK  Schedule labs to be done in 2 weeks, BMP dx HTN  Schedule a routine office visit or check up to be done in  3 months No fasting  Front desk:     30   AFTER YOU LEAVE THE OFFICE:  Continue taking aspirin daily  Start losartan HCT 50-12.5 one tablet every morning.   Check the  blood pressure daily Be sure your blood pressure is between 110/65 and  145/85. If it is consistently higher or lower, let me know

## 2015-04-15 NOTE — Progress Notes (Signed)
Subjective:    Patient ID: Patricia Moore, female    DOB: 03-07-1934, 81 y.o.   MRN: [id number]  DOS:  04/15/2015 Type of visit - description : Acute visit, multiple issues  Interval history: Dizziness: Saw neurology, note reviewed, felt to have benign positional vertigo, they rx a MRA MRI/MRA: No acute findings but had a aneurysm, 2 mm,  neurology rx to redo a MRA in one year. Hypertension: Currently on no medication, BPs has seen slightly elevated, at one point  160/90 in the ambulatory setting. Neck pain: Felt to be myofascial pain, prescribe Zanaflex, little help, additional medication?   Review of Systems Denies chest pain or difficulty breathing No nausea vomiting Has mild occasional headaches  Past Medical History  Diagnosis Date  . Allergy   . Hypertension   . Osteoporosis   . Diabetes mellitus   . Back pain   . Arthritis   . Hx of adenomatous colonic polyps   . Diverticulosis   . Hyperlipidemia   . H. pylori infection 2001  . Intraductal papilloma     bx nef x 2 in the 70s-90s  . SCC (squamous cell carcinoma) 03/2011    sees dermatology  . Diverticulitis     patient states has had three attacks   . Anxiety 12/09/2013  . DJD (degenerative joint disease) 12/14/2014    Past Surgical History  Procedure Laterality Date  . Breast biopsy  '91, '95    INTRADUCTAL PAPILLOMA  . Breast lumpectomy    . Esophagogastroduodenoscopy    . Colonoscopy         . Cataract extraction Left 09-2014    Social History   Social History  . Marital Status: Married    Spouse Name: N/A  . Number of Children: 1  . Years of Education: N/A   Occupational History  . retired     Social History Main Topics  . Smoking status: Never Smoker   . Smokeless tobacco: Never Used  . Alcohol Use: 0.6 oz/week    1 Glasses of wine per week     Comment: OCCASIONALLY  . Drug Use: No  . Sexual Activity: No   Other Topics Concern  . Not on file   Social History Narrative   Born in  [place], ,moved to the [place] in the 60s, live in [place] then in [place]   Lives w/ husband   H.S. graduate        Medication List       This list is accurate as of: 04/15/15 11:59 PM.  Always use your most recent med list.               ALPRAZolam 0.25 MG tablet  Commonly known as:  XANAX  Take 1 tablet (0.25 mg total) by mouth 3 (three) times daily as needed.     aspirin 81 MG tablet  Take 81 mg by mouth daily.     cholecalciferol 1000 units tablet  Commonly known as:  VITAMIN D  Take 1,000 Units by mouth daily.     glucose blood test strip  Commonly known as:  ONE TOUCH ULTRA TEST  Check blood sugar no more than twice daily.     glyBURIDE 2.5 MG tablet  Commonly known as:  DIABETA  Take 1 tablet (2.5 mg total) by mouth daily with breakfast.     losartan-hydrochlorothiazide 50-12.5 MG tablet  Commonly known as:  HYZAAR  Take 1 tablet by mouth daily.     metFORMIN  500 MG tablet  Commonly known as:  GLUCOPHAGE  Take 1 tablet (500 mg total) by mouth 2 (two) times daily with a meal.     onetouch ultrasoft lancets  Check blood sugar no more than twice daily.     tiZANidine 2 MG tablet  Commonly known as:  ZANAFLEX  Take 1 tablet (2 mg total) by mouth every 8 (eight) hours as needed for muscle spasms.     traMADol 50 MG tablet  Commonly known as:  ULTRAM  Take 1 tablet (50 mg total) by mouth every 12 (twelve) hours as needed.           Objective:   Physical Exam BP 142/76 mmHg  Pulse 75  Temp(Src) 98 F (36.7 C) (Oral)  Ht 5\' 1"  (1.549 m)  Wt 150 lb 8 oz (68.266 kg)  BMI 28.45 kg/m2  SpO2 98%  LMP 04/17/1988 General:   Well developed, well nourished . NAD.  HEENT:  Normocephalic . Face symmetric, atraumatic Lungs:  CTA B Normal respiratory effort, no intercostal retractions, no accessory muscle use. Heart: RRR,  no murmur.  No pretibial edema bilaterally  Skin: Not pale. Not jaundice Neurologic:  alert & oriented X3.  Speech normal, gait appropriate for  age and unassisted Psych--  Cognition and judgment appear intact.  Cooperative with normal attention span and concentration.  Behavior appropriate. No anxious or depressed appearing.      Assessment & Plan:   Assessment > DM HTN Hyperlipidemia Anxiety GI: --Recurrent diverticulitis (one episode documented by a CT 11-2014) --colon polyps --H. pylori 2001 DJD--back pain, knees Osteoporosis SCC, skin cancer Aneurysm, brain per MRI 03-2015, saw  neurology, Rx repeating one year  Plan  Neck pain: Saw neurology, felt to be a myofascial pain, rx a muscle relaxant, she try couple of times but did not have a significant improvement. Recommend to continue muscle relaxants, will try Ultram as needed (s/e drowsiness discussed). Dizziness: MRI/MRA no acute but she does have a 2 mm aneurysm, likely a incidental finding; dizziness felt to be BPV; was prescribed aspirin 81 mg. Recommend to continue aspirin, observation. Brain aneurysm: Recheck a MRA in one year. Aware to be vigilant about  severe or different headaches HTN: Currently on no medicines, BP has been elevated up to 160/90. Start losartan HCT 50-12.5 one tablet daily, check BPs daily, BMP in 2 weeks RTC 3 months , sooner if BP not controlled

## 2015-04-16 ENCOUNTER — Other Ambulatory Visit: Payer: Self-pay | Admitting: *Deleted

## 2015-04-16 ENCOUNTER — Telehealth: Payer: Self-pay | Admitting: Neurology

## 2015-04-16 MED ORDER — TIZANIDINE HCL 2 MG PO TABS: 2.0000 mg | ORAL_TABLET | Freq: Three times a day (TID) | ORAL | Status: DC | PRN

## 2015-04-16 NOTE — Telephone Encounter (Signed)
PT called and said she was doing good on the Zanaflex and asked if she should get a refill called in/Dawn CB# [phone]

## 2015-04-16 NOTE — Telephone Encounter (Signed)
Patient notified Rx has been sent in. 

## 2015-04-17 DIAGNOSIS — I671 Cerebral aneurysm, nonruptured: Secondary | ICD-10-CM | POA: Insufficient documentation

## 2015-04-17 NOTE — Assessment & Plan Note (Signed)
Neck pain: Saw neurology, felt to be a myofascial pain, rx a muscle relaxant, she try couple of times but did not have a significant improvement. Recommend to continue muscle relaxants, will try Ultram as needed (s/e drowsiness discussed). Dizziness: MRI/MRA no acute but she does have a 2 mm aneurysm, likely a incidental finding; dizziness felt to be BPV; was prescribed aspirin 81 mg. Recommend to continue aspirin, observation. Brain aneurysm: Recheck a MRA in one year. Aware to be vigilant about  severe or different headaches HTN: Currently on no medicines, BP has been elevated up to 160/90. Start losartan HCT 50-12.5 one tablet daily, check BPs daily, BMP in 2 weeks RTC 3 months , sooner if BP not controlled

## 2015-04-29 ENCOUNTER — Other Ambulatory Visit (INDEPENDENT_AMBULATORY_CARE_PROVIDER_SITE_OTHER): Payer: Medicare Other

## 2015-04-29 ENCOUNTER — Telehealth: Payer: Self-pay | Admitting: Internal Medicine

## 2015-04-29 DIAGNOSIS — I10 Essential (primary) hypertension: Secondary | ICD-10-CM | POA: Diagnosis not present

## 2015-04-29 LAB — BASIC METABOLIC PANEL
BUN: 18 mg/dL (ref 6–23)
CO2: 32 mEq/L (ref 19–32)
Calcium: 10 mg/dL (ref 8.4–10.5)
Chloride: 97 mEq/L (ref 96–112)
Creatinine, Ser: 0.89 mg/dL (ref 0.40–1.20)
GFR: 64.98 mL/min (ref >60.00)
Glucose, Bld: 270 mg/dL — ABNORMAL HIGH (ref 70–99)
Potassium: 4.3 mEq/L (ref 3.5–5.1)
Sodium: 135 mEq/L (ref 135–145)

## 2015-04-29 NOTE — Telephone Encounter (Signed)
I don't see any indication for antibiotic prophylaxis before dental work. I don't see the previous PCP prescribed it either. Let pt know.

## 2015-04-29 NOTE — Telephone Encounter (Signed)
LVM to call back at office.

## 2015-04-29 NOTE — Telephone Encounter (Signed)
Patricia Moore, will you inform Pt of Dr. Ethel Rana advice below. Please let me know if you or the Pt has any questions. Thank you.

## 2015-04-29 NOTE — Telephone Encounter (Signed)
Pt needing Abx for Dental work. Please advise.

## 2015-04-29 NOTE — Telephone Encounter (Signed)
Caller name: Svea  Relation to [id number]  Call back number: [phone] Pharmacy: [facility]  Reason for call: Pt came in office stating is leaving town in the afternoon today and is going to have Dental Work done, pt is needing antibiotic for her dental treatment that is going to be done this weekend at other town. Pt wants to know if she can get her rx today. Please advise.

## 2015-05-05 ENCOUNTER — Telehealth: Payer: Self-pay | Admitting: Neurology

## 2015-05-05 ENCOUNTER — Telehealth: Payer: Self-pay

## 2015-05-05 DIAGNOSIS — E119 Type 2 diabetes mellitus without complications: Secondary | ICD-10-CM | POA: Diagnosis not present

## 2015-05-05 DIAGNOSIS — H40003 Preglaucoma, unspecified, bilateral: Secondary | ICD-10-CM | POA: Diagnosis not present

## 2015-05-05 DIAGNOSIS — H25811 Combined forms of age-related cataract, right eye: Secondary | ICD-10-CM | POA: Diagnosis not present

## 2015-05-05 DIAGNOSIS — Z961 Presence of intraocular lens: Secondary | ICD-10-CM | POA: Diagnosis not present

## 2015-05-05 DIAGNOSIS — H43811 Vitreous degeneration, right eye: Secondary | ICD-10-CM | POA: Diagnosis not present

## 2015-05-05 NOTE — Telephone Encounter (Signed)
Spoke w/ patient. Appointment scheduled.

## 2015-05-05 NOTE — Telephone Encounter (Signed)
Pt needs to talk to someone about her brain tumor and she is still having pain please call [phone]

## 2015-05-05 NOTE — Telephone Encounter (Signed)
Patient call to report that for the last two days she has had bloody and blurry eyes. Woke up with no blurry vision or blood in eyes. Did make an appointment for 08/26/15, was encouraged to seek care at PCP if symptoms worsened. Please advise.

## 2015-05-05 NOTE — Telephone Encounter (Signed)
If she has blood in eyes, she may have burst a blood vessel in eye, but I don't know for sure.  She should contact PCP or go to ED

## 2015-05-05 NOTE — Telephone Encounter (Signed)
Returned daughter's call. VM left.

## 2015-05-05 NOTE — Telephone Encounter (Signed)
Please call Patricia Moore her daughter in law she is having blood in the eyes and pain and dizziness nancy phone number [phone]

## 2015-05-06 ENCOUNTER — Telehealth: Payer: Self-pay | Admitting: Internal Medicine

## 2015-05-06 NOTE — Telephone Encounter (Signed)
The daughter in law does not want to take her to the ED and she does not want her to see her PCP.  She would like for you to order MRI.

## 2015-05-06 NOTE — Telephone Encounter (Signed)
Called Izora Gala back and left message instructing her to take patient to the ED per Dr. Tomi Likens.

## 2015-05-06 NOTE — Telephone Encounter (Signed)
VM-PT's daughter left a message and needs a call back/Dawn CB# [phone]

## 2015-05-06 NOTE — Telephone Encounter (Signed)
She needs to go to the ED

## 2015-05-06 NOTE — Telephone Encounter (Signed)
[facility] - Client TELEPHONE ADVICE RECORD [facility]  Patient Name: Patricia Moore  DOB: [birth year]    Initial Comment Dizziness and double vision, blood pooling in left eye   Nurse Assessment  Nurse: Doyle Askew, RN, Joelene Millin Date/Time (Eastern Time): 05/06/2015 3:54:40 PM  Confirm and document reason for call. If symptomatic, describe symptoms. You must click the next button to save text entered. ---caller states mother in law Dizziness and double vision, blood pooling in left eye. Has had this since Sunday. Was referred to neurologist and did have findings that were concerning (pt may have had possible mini strokes). Appt scheduled March for neurologist. no chest, arm or jaw pain. No sweating .  Has the patient traveled out of the country within the last 30 days? ---Not Applicable  Does the patient have any new or worsening symptoms? ---Yes  Will a triage be completed? ---Yes  Related visit to physician within the last 2 weeks? ---Yes  Does the PT have any chronic conditions? (i.e. diabetes, asthma, etc.) ---Yes  List chronic conditions. ---diabetic  Is this a behavioral health or substance abuse call? ---No     Guidelines    Guideline Title Affirmed Question Affirmed Notes  Dizziness - Lightheadedness [1] MODERATE dizziness (e.g., interferes with normal activities) AND [2] has NOT been evaluated by physician for this (Exception: dizziness caused by heat exposure, sudden standing, or poor fluid intake)    Final Disposition User   See Physician within [facility], RN, Joelene Millin    Comments  Nurse made appt for pt 05/07/15 at 245 pm. Nurse informed pt and she stated understanding.   Referrals  REFERRED TO PCP OFFICE   Disagree/Comply: Comply

## 2015-05-06 NOTE — Telephone Encounter (Signed)
Appt noted

## 2015-05-06 NOTE — Telephone Encounter (Signed)
Daughter-in-law called stating that the patient has dizziness and double vision with blood shot eyes. Wanted patient to be seen today but no available appt. Transferred her to [facility] @ 1545. Spoke with Ada at [facility].

## 2015-05-07 ENCOUNTER — Telehealth: Payer: Self-pay

## 2015-05-07 ENCOUNTER — Encounter: Payer: Self-pay | Admitting: Internal Medicine

## 2015-05-07 ENCOUNTER — Ambulatory Visit (INDEPENDENT_AMBULATORY_CARE_PROVIDER_SITE_OTHER): Payer: Medicare Other | Admitting: Internal Medicine

## 2015-05-07 VITALS — BP 154/76 | HR 97 | Temp 98.0°F | Ht 61.0 in | Wt 150.4 lb

## 2015-05-07 DIAGNOSIS — H532 Diplopia: Secondary | ICD-10-CM

## 2015-05-07 DIAGNOSIS — I10 Essential (primary) hypertension: Secondary | ICD-10-CM | POA: Diagnosis not present

## 2015-05-07 DIAGNOSIS — E119 Type 2 diabetes mellitus without complications: Secondary | ICD-10-CM

## 2015-05-07 MED ORDER — LOSARTAN POTASSIUM-HCTZ 100-12.5 MG PO TABS: 1.0000 | ORAL_TABLET | Freq: Every day | ORAL | Status: DC

## 2015-05-07 NOTE — Telephone Encounter (Signed)
Received fax confirmation on 05/07/2015 at 3:39PM.

## 2015-05-07 NOTE — Progress Notes (Signed)
Pre visit review using our clinic review tool, if applicable. No additional management support is needed unless otherwise documented below in the visit note. 

## 2015-05-07 NOTE — Progress Notes (Signed)
Subjective:    Patient ID: Patricia Moore, female    DOB: January 06, 1934, 82 y.o.   MRN: [id number]  DOS:  05/07/2015 Type of visit - description :  Acute visit Interval history: On 05/02/2015, the left eye conjunctiva was extremely red. On 05/03/2015 she woke up w/ diplopia,  it lasted approximately 24 hours and gradually resolved. She went to see the eye doctor 05/05/2015, she was told that she "still have some blood behind the eye" and recommended to see a neurologist. Since then she is feeling relatively well: She has been having dizziness and the symptoms are still there but better. Denies amaurosis fugax She admits to mild headache and occasional nausea, symptoms ongoing on for a while not just for the last few days. Denies any slurred  speech, motor deficits or facial numbness.     BP Readings from Last 3 Encounters:  05/07/15 154/76  04/15/15 142/76  03/26/15 146/82     Review of Systems   Past Medical History  Diagnosis Date  . Allergy   . Hypertension   . Osteoporosis   . Diabetes mellitus   . Back pain   . Arthritis   . Hx of adenomatous colonic polyps   . Diverticulosis   . Hyperlipidemia   . H. pylori infection 2001  . Intraductal papilloma     bx nef x 2 in the 70s-90s  . SCC (squamous cell carcinoma) 03/2011    sees dermatology  . Diverticulitis     patient states has had three attacks   . Anxiety 12/09/2013  . DJD (degenerative joint disease) 12/14/2014    Past Surgical History  Procedure Laterality Date  . Breast biopsy  '91, '95    INTRADUCTAL PAPILLOMA  . Breast lumpectomy    . Esophagogastroduodenoscopy    . Colonoscopy         . Cataract extraction Left 09-2014    Social History   Social History  . Marital Status: Married    Spouse Name: N/A  . Number of Children: 1  . Years of Education: N/A   Occupational History  . retired     Social History Main Topics  . Smoking status: Never Smoker   . Smokeless tobacco: Never Used  .  Alcohol Use: 0.6 oz/week    1 Glasses of wine per week     Comment: OCCASIONALLY  . Drug Use: No  . Sexual Activity: No   Other Topics Concern  . Not on file   Social History Narrative   Born in [place], ,moved to the [place] in the 60s, live in [place] then in [place]   Lives w/ husband   H.S. graduate        Medication List       This list is accurate as of: 05/07/15 11:59 PM.  Always use your most recent med list.               ALPRAZolam 0.25 MG tablet  Commonly known as:  XANAX  Take 1 tablet (0.25 mg total) by mouth 3 (three) times daily as needed.     aspirin 81 MG tablet  Take 81 mg by mouth daily.     cholecalciferol 1000 units tablet  Commonly known as:  VITAMIN D  Take 1,000 Units by mouth daily.     glucose blood test strip  Commonly known as:  ONE TOUCH ULTRA TEST  Check blood sugar no more than twice daily.     glyBURIDE 2.5 MG  tablet  Commonly known as:  DIABETA  Take 1 tablet (2.5 mg total) by mouth daily with breakfast.     losartan-hydrochlorothiazide 100-12.5 MG tablet  Commonly known as:  HYZAAR  Take 1 tablet by mouth daily.     metFORMIN 500 MG tablet  Commonly known as:  GLUCOPHAGE  Take 1 tablet (500 mg total) by mouth 2 (two) times daily with a meal.     onetouch ultrasoft lancets  Check blood sugar no more than twice daily.     tiZANidine 2 MG tablet  Commonly known as:  ZANAFLEX  Take 1 tablet (2 mg total) by mouth every 8 (eight) hours as needed for muscle spasms.     traMADol 50 MG tablet  Commonly known as:  ULTRAM  Take 1 tablet (50 mg total) by mouth every 12 (twelve) hours as needed.           Objective:   Physical Exam BP 154/76 mmHg  Pulse 97  Temp(Src) 98 F (36.7 C) (Oral)  Ht 5\' 1"  (1.549 m)  Wt 150 lb 6 oz (68.21 kg)  BMI 28.43 kg/m2  SpO2 96%  LMP 04/17/1988 General:   Well developed, well nourished . NAD.  HEENT:  Normocephalic . Face symmetric, atraumatic. Eyes: No conjunctival erythema, anterior chambers  normal. Neck: Full range of motion Lungs:  CTA B Normal respiratory effort, no intercostal retractions, no accessory muscle use. Heart: RRR,  no murmur.  No pretibial edema bilaterally  Skin: Not pale. Not jaundice Neurologic:  alert & oriented X3.  Speech normal, gait appropriate for age and unassisted. EOMI, pupils equal and reactive, motor and DTRs symmetric. Psych--  Cognition and judgment appear intact.  Cooperative with normal attention span and concentration.  Behavior appropriate. No anxious or depressed appearing.      Assessment & Plan:   Assessment > DM HTN Hyperlipidemia Anxiety GI: --Recurrent diverticulitis (one episode documented by a CT 11-2014) --colon polyps --H. 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 Cont ASA --Carotid US 10-2013: No significant plaque, although ICA's are serpentine, bilaterally. 40-59% bilateral ICA stenosis by velocity criteria likely due to tortuosity. Patent vertebral arteries with antegrade flow.Normal subclavian arteries, bilaterally.  Plan  Transient diplopia, new:  Sx resolved within 24 hours, no associated unusual headaches, dizziness or other stroke symptoms. Plan: Get ophthalmology records, check a carotid ultrasound, will communicate with neurology and ask if something else needs to be done given history of the aneurysm. ER if symptoms come back. DM: Check A1c. HTN: BP previously in the 160s, now in the 140s after started losartan 50-12.5: Increase losartan HCT to 100-12.5. Continue monitoring BPs RTC 06-2014 as scheduled.

## 2015-05-07 NOTE — Telephone Encounter (Signed)
ROI faxed to Dr. Larose Kells at [phone], awaiting records. ROI sent for scanning.

## 2015-05-07 NOTE — Patient Instructions (Addendum)
BEFORE YOU LEAVE THE OFFICE: GO TO THE LAB  Get the blood work       AFTER YOU LEAVE THE OFFICE:  Increase losartan HCT to 100-12 0.5:  1 tablet daily   Check the  blood pressure 2 or 3 times a  week  Be sure your blood pressure is between 110/65 and  145/85. If it is consistently higher or lower, let me know   If you have more difficulties with double vision : go to the ER or call us

## 2015-05-08 LAB — HEMOGLOBIN A1C
Hgb A1c MFr Bld: 7.5 % — ABNORMAL HIGH (ref <5.7)
Mean Plasma Glucose: 169 mg/dL — ABNORMAL HIGH (ref <117)

## 2015-05-10 NOTE — Telephone Encounter (Signed)
Patient scheduled for tomorrow

## 2015-05-11 ENCOUNTER — Ambulatory Visit (INDEPENDENT_AMBULATORY_CARE_PROVIDER_SITE_OTHER): Payer: Medicare Other | Admitting: Neurology

## 2015-05-11 ENCOUNTER — Encounter: Payer: Self-pay | Admitting: Neurology

## 2015-05-11 VITALS — BP 146/84 | HR 68 | Ht 61.0 in | Wt 150.0 lb

## 2015-05-11 DIAGNOSIS — M542 Cervicalgia: Secondary | ICD-10-CM | POA: Diagnosis not present

## 2015-05-11 DIAGNOSIS — R42 Dizziness and giddiness: Secondary | ICD-10-CM | POA: Diagnosis not present

## 2015-05-11 DIAGNOSIS — H532 Diplopia: Secondary | ICD-10-CM

## 2015-05-11 DIAGNOSIS — I671 Cerebral aneurysm, nonruptured: Secondary | ICD-10-CM | POA: Diagnosis not present

## 2015-05-11 MED ORDER — BACLOFEN 10 MG PO TABS: ORAL_TABLET | ORAL | Status: DC

## 2015-05-11 NOTE — Addendum Note (Signed)
Addended byTomi Likens, ADAM R on: 05/11/2015 04:32 PM   Modules accepted: Orders, Medications

## 2015-05-11 NOTE — Patient Instructions (Signed)
1. We will get CT of head/CTA of head and neck  2. If nothing concerning, then will treat as presumed stroke and further recommendations at that time. 3. Follow up in 4 weeks.

## 2015-05-11 NOTE — Progress Notes (Addendum)
NEUROLOGY FOLLOW UP OFFICE NOTE  Patricia Moore [id number]  HISTORY OF PRESENT ILLNESS: Patricia Moore is a 82 year old right-handed female with hypertension, type 2 diabetes mellitus, hyperlipidemia, DJD and arthritis who follows up for dizziness, as well as episode of transient diplopia.  History obtained by patient, her daughter-in-law and PCP note.  Imaging of MRA of head and labs reviewed.  UPDATE: MRA of head from 04/05/15 demonstrated no correlating findings to the cerebellar legion seen on prior MRI.  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  Tizanidine 2mg  has been helpful for neck pain.  She had been taking it twice daily.  On the morning of 05/02/15, she woke up and felt dizzy.  She also reported horizontal double vision.  Her left eye was blood-red.  She reported slight headache but no slurred speech, gait instability or focal numbness or weakness. Diplopia lasted one to two days.  She saw the eye doctor who told her she did have "blood in the back" of her eye, perhaps having slept on it.  She continued to feel dizzy until two days ago.  At that time, she stopped the tizanidine.  Hgb A1c is 7.5.  Carotid doppler was ordered.  HISTORY: Since June, she has been experiencing severe right-sided posterior neck pain that radiates down to the right shoulder.  It does not radiate into the arm.  She denies numbness or tingling of the right upper extremity or back of the head.  It is painful with neck movement.  Applying pressure is helpful.  Pain is worse later in the day.  Over the past couple of months, she had two spells of dizziness.  She woke up from sleep and noted double vision and severe spinning lasting a couple of minutes.  She just closed her eyes and was still until it resolved.  She denied slurred speech, focal numbness or weakness or headache.  She woke up on her back but reports that she turns side to side when she sleeps.  It happened one other time.  Since then, she  still feels unsteady when she walks.  She has mild dizziness when she bends over.  She denies prior history of similar spells.  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 To evaluate neck pain, X-ray of cervical spine performed on 02/15/15 showed moderate multilevel degenerative disc and facet joint disease with straightening of the cervical lordosis, indicative of muscle spasm.  PAST MEDICAL HISTORY: Past Medical History  Diagnosis Date  . Allergy   . Hypertension   . Osteoporosis   . Diabetes mellitus   . Back pain   . Arthritis   . Hx of adenomatous colonic polyps   . Diverticulosis   . Hyperlipidemia   . H. pylori infection 2001  . Intraductal papilloma     bx nef x 2 in the 70s-90s  . SCC (squamous cell carcinoma) 03/2011    sees dermatology  . Diverticulitis     patient states has had three attacks   . Anxiety 12/09/2013  . DJD (degenerative joint disease) 12/14/2014    MEDICATIONS: Current Outpatient Prescriptions on File Prior to Visit  Medication Sig Dispense Refill  . ALPRAZolam (XANAX) 0.25 MG tablet Take 1 tablet (0.25 mg total) by mouth 3 (three) times daily as needed. 30 tablet 5  . aspirin 81 MG tablet Take 81 mg by mouth daily.    . cholecalciferol (VITAMIN D) 1000 UNITS tablet Take  1,000 Units by mouth daily.    Marland Kitchen glyBURIDE (DIABETA) 2.5 MG tablet Take 1 tablet (2.5 mg total) by mouth daily with breakfast. 30 tablet 5  . losartan-hydrochlorothiazide (HYZAAR) 100-12.5 MG tablet Take 1 tablet by mouth daily. 30 tablet 3  . metFORMIN (GLUCOPHAGE) 500 MG tablet Take 1 tablet (500 mg total) by mouth 2 (two) times daily with a meal. 180 tablet 2  . tiZANidine (ZANAFLEX) 2 MG tablet Take 1 tablet (2 mg total) by mouth every 8 (eight) hours as needed for muscle spasms. 30 tablet 0  . traMADol (ULTRAM) 50 MG tablet Take 1 tablet (50 mg total) by mouth every 12 (twelve) hours as needed. 30 tablet 0  . glucose blood (ONE TOUCH ULTRA TEST) test  strip Check blood sugar no more than twice daily. (Patient not taking: Reported on 05/11/2015) 100 each 12  . Lancets (ONETOUCH ULTRASOFT) lancets Check blood sugar no more than twice daily. (Patient not taking: Reported on 05/11/2015) 100 each 12   No current facility-administered medications on file prior to visit.    ALLERGIES: No Known Allergies  FAMILY HISTORY: Family History  Problem Relation Age of Onset  . Adopted: Yes  . Hypertension Neg Hx   . Diabetes Neg Hx   . Heart disease Mother 66  . Heart disease Father 24  . Breast cancer Maternal Aunt   . Stroke Maternal Aunt   . Colon cancer Paternal Uncle     7 uncles  . Colon cancer Maternal Grandfather     COLON,, FAMILY HX 7 MEMBERS  . Breast cancer Maternal Aunt   . Cancer Sister     skin    SOCIAL HISTORY: Social History   Social History  . Marital Status: Married    Spouse Name: N/A  . Number of Children: 1  . Years of Education: N/A   Occupational History  . retired     Social History Main Topics  . Smoking status: Never Smoker   . Smokeless tobacco: Never Used  . Alcohol Use: 0.6 oz/week    1 Glasses of wine per week     Comment: OCCASIONALLY  . Drug Use: No  . Sexual Activity: No   Other Topics Concern  . Not on file   Social History Narrative   Born in [place], ,moved to the [place] in the 60s, live in [place] then in [place] w/ husband   H.S. graduate    REVIEW OF SYSTEMS: Constitutional: No fevers, chills, or sweats, no generalized fatigue, change in appetite Eyes: as above Ear, nose and throat: No hearing loss, ear pain, nasal congestion, sore throat Cardiovascular: No chest pain, palpitations Respiratory:  No shortness of breath at rest or with exertion, wheezes GastrointestinaI: No nausea, vomiting, diarrhea, abdominal pain, fecal incontinence Genitourinary:  No dysuria, urinary retention or frequency Musculoskeletal:  Neck pain Integumentary: No rash, pruritus, skin lesions Neurological: as  above Psychiatric: No depression, insomnia, anxiety Endocrine: No palpitations, fatigue, diaphoresis, mood swings, change in appetite, change in weight, increased thirst Hematologic/Lymphatic:  No anemia, purpura, petechiae. Allergic/Immunologic: no itchy/runny eyes, nasal congestion, recent allergic reactions, rashes  PHYSICAL EXAM: Filed Vitals:   05/11/15 1340  BP: 146/84  Pulse: 68   General: No acute distress.  Patient appears well-groomed.  Head:  Normocephalic/atraumatic Eyes:  Fundoscopic exam without papilledema Neck: supple, no paraspinal tenderness, full range of motion Heart:  Regular rate and rhythm Lungs:  Clear to auscultation bilaterally Back: No paraspinal tenderness Neurological Exam: alert  and oriented to person, place, and time. Attention span and concentration intact, recent and remote memory intact, fund of knowledge intact.  Speech fluent and not dysarthric, language intact.  Convergence impaired, otherwise CN II-XII intact.  Bulk and tone normal, muscle strength 5/5 throughout.  Sensation to light touch, temperature and vibration intact.  Deep tendon reflexes 2+ throughout, toes downgoing.  Finger to nose and heel to shin testing intact.  Gait normal, Romberg negative.  IMPRESSION: Transient diplopia with dizziness Neck pain PCOM aneurysm HTN  PLAN: 1. For sake of time, we will get CT of head, as well as CTA of head and neck.  I would like to assess for any evidence of hemorrhage, ischemic stroke, reassessment of aneurysm and any extracranial stenosis/dissection. Her aneurysm is very small but with episode of diplopia, I want to re-evaluate.  If unremarkable, I would treat as if she had a stroke, whereby I would switch from ASA to Plavix and check fasting lipid panel. 2.  We discussed possibility of referral to endovascular specialist.  I explained that her aneurysm is very small, with a 2.5% chance of rupture over 5 years.  She would like to hold off for now, at  least until the CT. 3.  Further recommendations pending CT results. 4.  BP mildly elevated today.  Should be rechecked with PCP. 5.  Instead of tizanidine, will try baclofen.  She will first try 5 to 10mg  at bedtime. 6.  Follow up in 4 weeks.  Metta Clines, DO  CC:  Kathlene November, MD

## 2015-05-11 NOTE — Telephone Encounter (Signed)
Several notes from ophthalmology reviewed and included in the one from 05/05/2015: Patient complained of diplopia and redness. Ophthalmology recommendations were as follows: No motility disturbance found an eye exam, mild evidence of ocular surface disease which is likely the cause of her symptoms. Pt was reassured and  recommended to follow-up with her primary care physician for additional workup for her dizziness and go to the ER if has  diplopia associated with symptoms of a stroke. Will share this information with the patient's neurologist.

## 2015-05-11 NOTE — Telephone Encounter (Signed)
Received medical records from Dr. Larose Kells. Records forwarded to Dr. Larose Kells for review.

## 2015-05-12 MED ORDER — METFORMIN HCL 1000 MG PO TABS: 1000.0000 mg | ORAL_TABLET | Freq: Two times a day (BID) | ORAL | Status: DC

## 2015-05-12 NOTE — Addendum Note (Signed)
Addended by: Damita Dunnings D on: 05/12/2015 01:55 PM   Modules accepted: Orders, Medications

## 2015-05-13 ENCOUNTER — Other Ambulatory Visit: Payer: Medicare Other

## 2015-05-14 ENCOUNTER — Encounter (HOSPITAL_COMMUNITY): Payer: Self-pay

## 2015-05-14 ENCOUNTER — Ambulatory Visit (HOSPITAL_COMMUNITY)
Admission: RE | Admit: 2015-05-14 | Discharge: 2015-05-14 | Disposition: A | Discharge status: Home / Self Care | Payer: Medicare Other | Source: Ambulatory Visit | Attending: Neurology | Admitting: Neurology

## 2015-05-14 ENCOUNTER — Telehealth: Payer: Self-pay

## 2015-05-14 ENCOUNTER — Telehealth: Payer: Self-pay | Admitting: Neurology

## 2015-05-14 DIAGNOSIS — R42 Dizziness and giddiness: Secondary | ICD-10-CM

## 2015-05-14 DIAGNOSIS — H532 Diplopia: Secondary | ICD-10-CM | POA: Diagnosis not present

## 2015-05-14 DIAGNOSIS — I671 Cerebral aneurysm, nonruptured: Secondary | ICD-10-CM | POA: Diagnosis not present

## 2015-05-14 DIAGNOSIS — I729 Aneurysm of unspecified site: Secondary | ICD-10-CM

## 2015-05-14 DIAGNOSIS — M542 Cervicalgia: Secondary | ICD-10-CM | POA: Diagnosis not present

## 2015-05-14 MED ORDER — IOHEXOL 350 MG/ML SOLN
100.0000 mL | Freq: Once | INTRAVENOUS | Status: AC | PRN
  Administered 2015-05-14: 100 mL via INTRAVENOUS

## 2015-05-14 NOTE — Telephone Encounter (Signed)
See previous telephone encounter. Message relayed to [place]. Verbalized understanding and denied questions.

## 2015-05-14 NOTE — Telephone Encounter (Signed)
Left message on machine for pt to return call to the office. Referral placed to [facility] [phone]

## 2015-05-14 NOTE — Telephone Encounter (Signed)
-----   Message from Pieter Partridge, DO sent at 05/14/2015  9:37 AM EST ----- CTA shows stability of the aneurysm.  I don't think the aneurysm is causing any symptoms.  However, based on its location, I would like her evaluated by a endovascular surgeon.  I would like to refer her to Dr. Casimer Bilis at [facility]

## 2015-05-14 NOTE — Telephone Encounter (Signed)
VM-[name] left message in regards to PT and said she received a phone call and would like a call back at [phone]

## 2015-05-19 ENCOUNTER — Telehealth: Payer: Self-pay | Admitting: Internal Medicine

## 2015-05-19 NOTE — Telephone Encounter (Signed)
Caller name: Adaliah Marana  Relation to pt: daughter in law Call back number: [phone]    Reason for call:  Patient in need of clarification why PCP placed an order for CAROTID  imaging for 05/20/15. Please advise directly

## 2015-05-19 NOTE — Telephone Encounter (Signed)
Informed Pt of US Carotid, regarding eye problems she was seen for several weeks ago. Pt verbalized understanding.

## 2015-05-19 NOTE — Telephone Encounter (Signed)
Please advise, I do not see where you have ordered US Carotid since 10/2013.

## 2015-05-19 NOTE — Telephone Encounter (Signed)
Dr. Georgie Chard office called stating Carotid US was ordered 05/09/15 by Dr. Larose Kells (per order review on left of screen?)

## 2015-05-19 NOTE — Telephone Encounter (Signed)
According to my note it was ordered, please reenter a order just to be sure.

## 2015-05-19 NOTE — Telephone Encounter (Signed)
Do not see where Dr. Larose Kells ordered Carotid US. Looks like Dr. Tomi Likens did, Pt will need to call their office.

## 2015-05-20 ENCOUNTER — Ambulatory Visit (HOSPITAL_COMMUNITY)
Admission: RE | Admit: 2015-05-20 | Discharge: 2015-05-20 | Disposition: A | Discharge status: Home / Self Care | Payer: Medicare Other | Source: Ambulatory Visit | Attending: Internal Medicine | Admitting: Internal Medicine

## 2015-05-20 DIAGNOSIS — H532 Diplopia: Secondary | ICD-10-CM

## 2015-05-24 DIAGNOSIS — I671 Cerebral aneurysm, nonruptured: Secondary | ICD-10-CM | POA: Diagnosis not present

## 2015-06-07 DIAGNOSIS — Z803 Family history of malignant neoplasm of breast: Secondary | ICD-10-CM | POA: Diagnosis not present

## 2015-06-07 DIAGNOSIS — Z1231 Encounter for screening mammogram for malignant neoplasm of breast: Secondary | ICD-10-CM | POA: Diagnosis not present

## 2015-06-07 LAB — HM MAMMOGRAPHY

## 2015-06-10 ENCOUNTER — Encounter: Payer: Self-pay | Admitting: Gynecology

## 2015-06-10 ENCOUNTER — Encounter: Payer: Self-pay | Admitting: Internal Medicine

## 2015-06-11 ENCOUNTER — Encounter: Payer: Self-pay | Admitting: Neurology

## 2015-06-11 ENCOUNTER — Ambulatory Visit (INDEPENDENT_AMBULATORY_CARE_PROVIDER_SITE_OTHER): Payer: Medicare Other | Admitting: Neurology

## 2015-06-11 ENCOUNTER — Encounter: Payer: Medicare Other | Admitting: Gynecology

## 2015-06-11 VITALS — BP 138/58 | HR 68 | Ht 62.0 in | Wt 153.0 lb

## 2015-06-11 DIAGNOSIS — I671 Cerebral aneurysm, nonruptured: Secondary | ICD-10-CM | POA: Diagnosis not present

## 2015-06-11 DIAGNOSIS — I10 Essential (primary) hypertension: Secondary | ICD-10-CM

## 2015-06-11 DIAGNOSIS — I639 Cerebral infarction, unspecified: Secondary | ICD-10-CM | POA: Diagnosis not present

## 2015-06-11 DIAGNOSIS — E119 Type 2 diabetes mellitus without complications: Secondary | ICD-10-CM

## 2015-06-11 DIAGNOSIS — M542 Cervicalgia: Secondary | ICD-10-CM | POA: Diagnosis not present

## 2015-06-11 MED ORDER — BACLOFEN 10 MG PO TABS: ORAL_TABLET | ORAL | Status: DC

## 2015-06-11 NOTE — Patient Instructions (Addendum)
Since I have no explanation for the double vision, I have to treat it as if it was a stroke 1.  Continue aspirin 81mg  daily 2.  Will check another fasting lipid panel.  Be aware that I will likely have to start you on a cholesterol lowering medication 3.  Diabetes and blood pressure control 4.  Baclofen 5mg  at bedtime 5.  Will check 2D echo 6.  Follow up in 3 to 4 months (recheck fasting lipid panel just prior to follow up)

## 2015-06-11 NOTE — Progress Notes (Signed)
NEUROLOGY FOLLOW UP OFFICE NOTE  ARIS STRELOW HS:[id number]  HISTORY OF PRESENT ILLNESS: Patricia Moore is a 82 year old right-handed female with hypertension, type 2 diabetes mellitus, hyperlipidemia, DJD and arthritis who follows up for dizziness, as well as episode of transient diplopia.  History obtained by patient, her daughter-in-law and PCP note.  Imaging of MRA of head and labs reviewed.  UPDATE: Tizanidine helped neck pain but it made her feel dizzy.  She was switched to baclofen 5mg  at bedtime, which has significantly decreased the neck pain.  She still feels a little dizzy in the morning but nothing like before.  She was evaluated by neurosurgery who did not feel that the pcomm aneurysm or cavernoma required intervention. Since the episode of dizziness and diplopia, she has started asa 81mg  daily LDL from 12/15/14 was 98.       HISTORY: Since June, she has been experiencing severe right-sided posterior neck pain that radiates down to the right shoulder.  It does not radiate into the arm.  She denies numbness or tingling of the right upper extremity or back of the head.  It is painful with neck movement.  Applying pressure is helpful.  Pain is worse later in the day.  Over the past couple of months, she had two spells of dizziness.  She woke up from sleep and noted double vision and severe spinning lasting a couple of minutes.  She just closed her eyes and was still until it resolved.  She denied slurred speech, focal numbness or weakness or headache.  She woke up on her back but reports that she turns side to side when she sleeps.  It happened one other time.  Since then, she still feels unsteady when she walks.  She has mild dizziness when she bends over.  She denies prior history of similar spells.  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 To evaluate neck pain, X-ray of cervical spine performed on 02/15/15 showed moderate multilevel degenerative disc and  facet joint disease with straightening of the cervical lordosis, indicative of muscle spasm.  MRA of head from 04/05/15 demonstrated no correlating findings to the cerebellar legion seen on prior MRI.  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  On the morning of 05/02/15, she woke up and felt dizzy.  She also reported horizontal double vision.  Her left eye was blood-red.  She reported slight headache but no slurred speech, gait instability or focal numbness or weakness. Diplopia lasted one to two days.  She saw the eye doctor who told her she did have "blood in the back" of her eye, perhaps having slept on it.    CT head and CTA of head and neck from 05/14/15 revealed stable small 2-3 mm right Pcomm.  She was referred to endovascular surgery for evaluation.  PAST MEDICAL HISTORY: Past Medical History  Diagnosis Date  . Allergy   . Hypertension   . Osteoporosis   . Diabetes mellitus   . Back pain   . Arthritis   . Hx of adenomatous colonic polyps   . Diverticulosis   . Hyperlipidemia   . H. pylori infection 2001  . Intraductal papilloma     bx nef x 2 in the 70s-90s  . SCC (squamous cell carcinoma) 03/2011    sees dermatology  . Diverticulitis     patient states has had three attacks   . Anxiety 12/09/2013  . DJD (degenerative joint  disease) 12/14/2014    MEDICATIONS: Current Outpatient Prescriptions on File Prior to Visit  Medication Sig Dispense Refill  . ALPRAZolam (XANAX) 0.25 MG tablet Take 1 tablet (0.25 mg total) by mouth 3 (three) times daily as needed. 30 tablet 5  . aspirin 81 MG tablet Take 81 mg by mouth daily.    . cholecalciferol (VITAMIN D) 1000 UNITS tablet Take 1,000 Units by mouth daily.    Marland Kitchen glyBURIDE (DIABETA) 2.5 MG tablet Take 1 tablet (2.5 mg total) by mouth daily with breakfast. 30 tablet 5  . losartan-hydrochlorothiazide (HYZAAR) 100-12.5 MG tablet Take 1 tablet by mouth daily. 30 tablet 3  . metFORMIN (GLUCOPHAGE) 1000 MG tablet Take 1 tablet  (1,000 mg total) by mouth 2 (two) times daily with a meal. 180 tablet 1  . traMADol (ULTRAM) 50 MG tablet Take 1 tablet (50 mg total) by mouth every 12 (twelve) hours as needed. 30 tablet 0   No current facility-administered medications on file prior to visit.    ALLERGIES: No Known Allergies  FAMILY HISTORY: Family History  Problem Relation Age of Onset  . Adopted: Yes  . Hypertension Neg Hx   . Diabetes Neg Hx   . Heart disease Mother 41  . Heart disease Father 41  . Breast cancer Maternal Aunt   . Stroke Maternal Aunt   . Colon cancer Paternal Uncle     7 uncles  . Colon cancer Maternal Grandfather     COLON,, FAMILY HX 7 MEMBERS  . Breast cancer Maternal Aunt   . Cancer Sister     skin    SOCIAL HISTORY: Social History   Social History  . Marital Status: Married    Spouse Name: N/A  . Number of Children: 1  . Years of Education: N/A   Occupational History  . retired     Social History Main Topics  . Smoking status: Never Smoker   . Smokeless tobacco: Never Used  . Alcohol Use: 0.6 oz/week    1 Glasses of wine per week     Comment: OCCASIONALLY  . Drug Use: No  . Sexual Activity: No   Other Topics Concern  . Not on file   Social History Narrative   Born in [place], ,moved to the [place] in the 60s, live in [place] then in [place] w/ husband   H.S. graduate    REVIEW OF SYSTEMS: Constitutional: No fevers, chills, or sweats, no generalized fatigue, change in appetite Eyes: No visual changes, double vision, eye pain Ear, nose and throat: No hearing loss, ear pain, nasal congestion, sore throat Cardiovascular: No chest pain, palpitations Respiratory:  No shortness of breath at rest or with exertion, wheezes GastrointestinaI: No nausea, vomiting, diarrhea, abdominal pain, fecal incontinence Genitourinary:  No dysuria, urinary retention or frequency Musculoskeletal:  No neck pain, back pain Integumentary: No rash, pruritus, skin lesions Neurological: as  above Psychiatric: No depression, insomnia, anxiety Endocrine: No palpitations, fatigue, diaphoresis, mood swings, change in appetite, change in weight, increased thirst Hematologic/Lymphatic:  No anemia, purpura, petechiae. Allergic/Immunologic: no itchy/runny eyes, nasal congestion, recent allergic reactions, rashes  PHYSICAL EXAM: Filed Vitals:   06/11/15 1500  BP: 138/58  Pulse: 68   General: No acute distress.  Patient appears well-groomed.  normal body habitus. Head:  Normocephalic/atraumatic  IMPRESSION: Transient diplopia with dizziness.  Symptoms lasted 2 days.  As diagnosis of exclusion, especially with her risk factors, I would favor treating it as if it was a stroke.  I  will not obtain an MRI since it would not change my management. Neck pain, myofascial PCOMM aneurysm, asymptomatic and stable HTN Type 2 diabetes  PLAN: 1.  Continue asa 81mg  daily 2.  Recheck fasting lipid panel.  She will likely need to start statin as LDL goal in stroke patient should be less than 70 3.  Check 2D echo 4.  Optimize glycemic control 5.  Follow up in 3 to 4 months.  Will recheck fasting lipid panel just prior to follow up  15 minutes spent face to face with patient, 100% spent discussing management and diagnosis.  Metta Clines, DO  CC:  Kathlene November, MD

## 2015-06-16 ENCOUNTER — Other Ambulatory Visit: Payer: Medicare Other

## 2015-06-16 ENCOUNTER — Encounter: Payer: Self-pay | Admitting: Internal Medicine

## 2015-06-16 ENCOUNTER — Ambulatory Visit (INDEPENDENT_AMBULATORY_CARE_PROVIDER_SITE_OTHER): Payer: Medicare Other | Admitting: Internal Medicine

## 2015-06-16 ENCOUNTER — Telehealth: Payer: Self-pay

## 2015-06-16 VITALS — BP 132/48 | HR 93 | Temp 98.0°F | Ht 62.0 in | Wt 150.1 lb

## 2015-06-16 DIAGNOSIS — R109 Unspecified abdominal pain: Secondary | ICD-10-CM | POA: Diagnosis not present

## 2015-06-16 DIAGNOSIS — E119 Type 2 diabetes mellitus without complications: Secondary | ICD-10-CM

## 2015-06-16 DIAGNOSIS — I10 Essential (primary) hypertension: Secondary | ICD-10-CM

## 2015-06-16 DIAGNOSIS — I639 Cerebral infarction, unspecified: Secondary | ICD-10-CM | POA: Diagnosis not present

## 2015-06-16 DIAGNOSIS — Z09 Encounter for follow-up examination after completed treatment for conditions other than malignant neoplasm: Secondary | ICD-10-CM

## 2015-06-16 LAB — CBC WITH DIFFERENTIAL/PLATELET
Basophils Absolute: 0.1 10*3/uL (ref 0.0–0.1)
Basophils Relative: 0.7 % (ref 0.0–3.0)
Eosinophils Absolute: 1 10*3/uL — ABNORMAL HIGH (ref 0.0–0.7)
Eosinophils Relative: 11.1 % — ABNORMAL HIGH (ref 0.0–5.0)
HCT: 40.7 % (ref 36.0–46.0)
Hemoglobin: 13.7 g/dL (ref 12.0–15.0)
Lymphocytes Relative: 30.9 % (ref 12.0–46.0)
Lymphs Abs: 2.7 10*3/uL (ref 0.7–4.0)
MCHC: 33.7 g/dL (ref 30.0–36.0)
MCV: 89.6 fl (ref 78.0–100.0)
Monocytes Absolute: 0.5 10*3/uL (ref 0.1–1.0)
Monocytes Relative: 6.2 % (ref 3.0–12.0)
Neutro Abs: 4.4 10*3/uL (ref 1.4–7.7)
Neutrophils Relative %: 51.1 % (ref 43.0–77.0)
Platelets: 343 10*3/uL (ref 150.0–400.0)
RBC: 4.54 Mil/uL (ref 3.87–5.11)
RDW: 13.3 % (ref 11.5–15.5)
WBC: 8.6 10*3/uL (ref 4.0–10.5)

## 2015-06-16 LAB — LIPID PANEL
Cholesterol: 185 mg/dL (ref 0–200)
HDL: 66.7 mg/dL (ref >39.00)
LDL Cholesterol: 99 mg/dL (ref 0–99)
NonHDL: 118.13
Total CHOL/HDL Ratio: 3
Triglycerides: 98 mg/dL (ref 0.0–149.0)
VLDL: 19.6 mg/dL (ref 0.0–40.0)

## 2015-06-16 NOTE — Patient Instructions (Signed)
   GO TO THE FRONT DESK  Schedule a routine office visit or check up to be done in  3 MONTHS  No fasting    Check the  blood pressure 2 or 3 times a week  Be sure your blood pressure is between 110/65 and  145/85. If it is consistently higher or lower, let me know

## 2015-06-16 NOTE — Progress Notes (Signed)
Pre visit review using our clinic review tool, if applicable. No additional management support is needed unless otherwise documented below in the visit note. 

## 2015-06-16 NOTE — Telephone Encounter (Signed)
Left message w/ husband for pt to return call to clinic.

## 2015-06-16 NOTE — Telephone Encounter (Signed)
-----   Message from Pieter Partridge, DO sent at 06/16/2015  2:06 PM EST ----- LDL is 99.  I would like it less than 70.  I would like to start Lipitor 20mg  daily and repeat fasting lipid panel in 3 months.

## 2015-06-16 NOTE — Progress Notes (Signed)
Subjective:    Patient ID: Patricia Moore, female    DOB: 1933/09/04, 82 y.o.   MRN: [id number]  DOS:  06/16/2015 Type of visit - description : Follow-up from previous visit Interval history:  Diplopia, dizziness, symptoms resolve,  Neurology note and CT results reviewed today HTN: Medication was adjusted, ambulatory BPs much improved, range from 109 to 128 with the diastolic between 55 and 70. No apparent side effects. DM: Metformin dose was increased, reports that at nighttime her blood sugar has dropped to the 50s sometimes.  Review of Systems Denies chest pain difficulty breathing No nausea, vomiting, diarrhea  Past Medical History  Diagnosis Date  . Allergy   . Hypertension   . Osteoporosis   . Diabetes mellitus   . Back pain   . Arthritis   . Hx of adenomatous colonic polyps   . Diverticulosis   . Hyperlipidemia   . H. pylori infection 2001  . Intraductal papilloma     bx nef x 2 in the 70s-90s  . SCC (squamous cell carcinoma) 03/2011    sees dermatology  . Diverticulitis     patient states has had three attacks   . Anxiety 12/09/2013  . DJD (degenerative joint disease) 12/14/2014    Past Surgical History  Procedure Laterality Date  . Breast biopsy  '91, '95    INTRADUCTAL PAPILLOMA  . Breast lumpectomy    . Esophagogastroduodenoscopy    . Colonoscopy         . Cataract extraction Left 09-2014    Social History   Social History  . Marital Status: Married    Spouse Name: N/A  . Number of Children: 1  . Years of Education: N/A   Occupational History  . retired     Social History Main Topics  . Smoking status: Never Smoker   . Smokeless tobacco: Never Used  . Alcohol Use: 0.6 oz/week    1 Glasses of wine per week     Comment: OCCASIONALLY  . Drug Use: No  . Sexual Activity: No   Other Topics Concern  . Not on file   Social History Narrative   Born in [place], ,moved to the [place] in the 60s, live in [place] then in [place]   Lives w/ husband   H.S. graduate         Medication List       This list is accurate as of: 06/16/15 11:59 PM.  Always use your most recent med list.               ALPRAZolam 0.25 MG tablet  Commonly known as:  XANAX  Take 1 tablet (0.25 mg total) by mouth 3 (three) times daily as needed.     aspirin 81 MG tablet  Take 81 mg by mouth daily.     baclofen 10 MG tablet  Commonly known as:  LIORESAL  Take 0.5 to 1 tablet at bedtime     cholecalciferol 1000 units tablet  Commonly known as:  VITAMIN D  Take 1,000 Units by mouth daily.     glyBURIDE 2.5 MG tablet  Commonly known as:  DIABETA  Take 1 tablet (2.5 mg total) by mouth daily with breakfast.     losartan-hydrochlorothiazide 100-12.5 MG tablet  Commonly known as:  HYZAAR  Take 1 tablet by mouth daily.     metFORMIN 1000 MG tablet  Commonly known as:  GLUCOPHAGE  Take by mouth. 1 TABLET WITH BREAKFAST, 1/2 WITH LUNCH, 1/2 WITH  DINNER     traMADol 50 MG tablet  Commonly known as:  ULTRAM  Take 1 tablet (50 mg total) by mouth every 12 (twelve) hours as needed.           Objective:   Physical Exam BP 132/48 mmHg  Pulse 93  Temp(Src) 98 F (36.7 C) (Oral)  Ht 5\' 2"  (1.575 m)  Wt 150 lb 2 oz (68.096 kg)  BMI 27.45 kg/m2  SpO2 96%  LMP 04/17/1988 General:   Well developed, well nourished . NAD.  HEENT:  Normocephalic . Face symmetric, atraumatic Lungs:  CTA B Normal respiratory effort, no intercostal retractions, no accessory muscle use. Heart: RRR,  no murmur.  No pretibial edema bilaterally  Skin: Not pale. Not jaundice Neurologic:  alert & oriented X3.  Speech normal, gait appropriate for age and unassisted Psych--  Cognition and judgment appear intact.  Cooperative with normal attention span and concentration.  Behavior appropriate. No anxious or depressed appearing.      Assessment & Plan:   Assessment > DM HTN Hyperlipidemia Anxiety GI: --Recurrent diverticulitis (one episode documented by a CT 11-2014) --colon  polyps --H. pylori 2001 DJD--back pain, knees Osteoporosis SCC, skin cancer NEURO --Carotid US 10-2013: No significant plaque, although ICA's are serpentine, bilaterally. 40-59% bilateral ICA stenosis by velocity criteria likely due to tortuosity. Patent vertebral arteries with antegrade flow.Normal subclavian arteries, bilaterally. -- Transinet dizziness, diplopia sx late 2016 , saw neuro >>> Dx w/ TIA/Stroke on clinical grounds  W/u>> Brain  MRI 03-2015 >> 2 mm saccular aneurysm at the right posterior communicating artery origin, saw  Neurology, recheck MRI 1 year CTA head, neck 05-14-2015: aneurysm confirmed, otherwise (-)  Plan: Stroke/TIA: sx resolved, workup reviewed, see above. Plan is to continue controlling cardiovascular risk factors and aspirin. DM: Last A1c 7.5, metformin dose increased, having low sugars at nighttime, recommend to change metformin to 1000-500-500 mg and continue with glyburide. HTN: Improve after we adjusted her medication the last visit. Check a BMP Hyperlipidemia: FLP pending, currently taking no statins and she is somewhat reluctant to do that. RTC 3 months

## 2015-06-17 ENCOUNTER — Telehealth: Payer: Self-pay | Admitting: Neurology

## 2015-06-17 ENCOUNTER — Encounter: Payer: Medicare Other | Admitting: Gynecology

## 2015-06-17 NOTE — Telephone Encounter (Signed)
Please see previous telephone message.

## 2015-06-17 NOTE — Assessment & Plan Note (Signed)
Stroke/TIA: sx resolved, workup reviewed, see above. Plan is to continue controlling cardiovascular risk factors and aspirin. DM: Last A1c 7.5, metformin dose increased, having low sugars at nighttime, recommend to change metformin to 1000-500-500 mg and continue with glyburide. HTN: Improve after we adjusted her medication the last visit. Check a BMP Hyperlipidemia: FLP pending, currently taking no statins and she is somewhat reluctant to do that. RTC 3 months

## 2015-06-17 NOTE — Telephone Encounter (Signed)
Left message on machine for pt to return call to the office.  

## 2015-06-17 NOTE — Telephone Encounter (Signed)
Pt called and was returning your call/Dawn [phone]

## 2015-06-18 ENCOUNTER — Other Ambulatory Visit (INDEPENDENT_AMBULATORY_CARE_PROVIDER_SITE_OTHER): Payer: Medicare Other

## 2015-06-18 DIAGNOSIS — I10 Essential (primary) hypertension: Secondary | ICD-10-CM | POA: Diagnosis not present

## 2015-06-18 LAB — BASIC METABOLIC PANEL
BUN: 24 mg/dL — ABNORMAL HIGH (ref 6–23)
CO2: 27 mEq/L (ref 19–32)
Calcium: 10.3 mg/dL (ref 8.4–10.5)
Chloride: 101 mEq/L (ref 96–112)
Creatinine, Ser: 0.98 mg/dL (ref 0.40–1.20)
GFR: 58.12 mL/min — ABNORMAL LOW (ref >60.00)
Glucose, Bld: 176 mg/dL — ABNORMAL HIGH (ref 70–99)
Potassium: 5.1 mEq/L (ref 3.5–5.1)
Sodium: 138 mEq/L (ref 135–145)

## 2015-06-25 ENCOUNTER — Other Ambulatory Visit (HOSPITAL_COMMUNITY): Payer: Medicare Other

## 2015-07-01 ENCOUNTER — Encounter: Payer: Medicare Other | Admitting: Gynecology

## 2015-07-05 ENCOUNTER — Ambulatory Visit (INDEPENDENT_AMBULATORY_CARE_PROVIDER_SITE_OTHER): Payer: Medicare Other | Admitting: Internal Medicine

## 2015-07-05 ENCOUNTER — Telehealth: Payer: Self-pay | Admitting: Internal Medicine

## 2015-07-05 ENCOUNTER — Encounter: Payer: Self-pay | Admitting: Internal Medicine

## 2015-07-05 VITALS — BP 132/56 | HR 88 | Temp 98.2°F | Ht 62.0 in | Wt 151.0 lb

## 2015-07-05 DIAGNOSIS — K5713 Diverticulitis of small intestine without perforation or abscess with bleeding: Secondary | ICD-10-CM | POA: Diagnosis not present

## 2015-07-05 DIAGNOSIS — R1032 Left lower quadrant pain: Secondary | ICD-10-CM

## 2015-07-05 DIAGNOSIS — Z09 Encounter for follow-up examination after completed treatment for conditions other than malignant neoplasm: Secondary | ICD-10-CM

## 2015-07-05 MED ORDER — METRONIDAZOLE 500 MG PO TABS: 500.0000 mg | ORAL_TABLET | Freq: Three times a day (TID) | ORAL | Status: DC

## 2015-07-05 MED ORDER — CIPROFLOXACIN HCL 500 MG PO TABS: 500.0000 mg | ORAL_TABLET | Freq: Two times a day (BID) | ORAL | Status: DC

## 2015-07-05 NOTE — Patient Instructions (Addendum)
Go to the labs from provide a urine sample  Start Cipro and Flagyl for 10 days  Bland diet    If you are not improving in the next 36 hours: call the office   Call or come to the ER if severe symptoms, fever, chills, diarrhea, blood in the stools.   Watch for low blood sugars

## 2015-07-05 NOTE — Progress Notes (Signed)
Subjective:    Patient ID: Patricia Moore, female    DOB: [birth year], 82 y.o.   MRN: [id number]  DOS:  07/05/2015 Type of visit - description : Acute visit  Interval history: "I know I have diverticulitis". Patient reports she developed left lower quadrant abdominal pain 3 days ago, pain is steady, mild to moderate, no getting worse. Sx similar to previous episodes of diverticulitis. She also having colicky intense pain on and off associated with a small amount of mucus and blood per rectum. As far as bowel movements, she had at least one a day, brown, slightly loose but no diarrhea.  Also, on Friday with the onset of GI symptoms she has severe chills and noted the tip of her fingers were "black and blue", her blood sugar and BP were checked and normal. She felt better after she warm up her fingers holding a cup of hot coffee.    Review of Systems  Denies dysuria, gross hematuria or flank pain Appetite is slightly decreased but is tolerating fluids without a problem, elected to do a bland diet to help her diverticulitis. No vomiting, nausea only 1 time 3 days ago. At some point in the past, her fingers were pale in the cold weather but does not a recurrent issue  Past Medical History  Diagnosis Date  . Allergy   . Hypertension   . Osteoporosis   . Diabetes mellitus   . Back pain   . Arthritis   . Hx of adenomatous colonic polyps   . Diverticulosis   . Hyperlipidemia   . H. pylori infection 2001  . Intraductal papilloma     bx nef x 2 in the 70s-90s  . SCC (squamous cell carcinoma) 03/2011    sees dermatology  . Diverticulitis     patient states has had three attacks   . Anxiety 12/09/2013  . DJD (degenerative joint disease) 12/14/2014    Past Surgical History  Procedure Laterality Date  . Breast biopsy  '91, '95    INTRADUCTAL PAPILLOMA  . Breast lumpectomy    . Esophagogastroduodenoscopy    . Colonoscopy         . Cataract extraction Left 09-2014    Social History    Social History  . Marital Status: Married    Spouse Name: N/A  . Number of Children: 1  . Years of Education: N/A   Occupational History  . retired     Social History Main Topics  . Smoking status: Never Smoker   . Smokeless tobacco: Never Used  . Alcohol Use: 0.6 oz/week    1 Glasses of wine per week     Comment: OCCASIONALLY  . Drug Use: No  . Sexual Activity: No   Other Topics Concern  . Not on file   Social History Narrative   Born in [place], ,moved to the [place] in the 60s, live in [place] then in [place]   Lives w/ husband   H.S. graduate        Medication List       This list is accurate as of: 07/05/15 11:59 PM.  Always use your most recent med list.               ALPRAZolam 0.25 MG tablet  Commonly known as:  XANAX  Take 1 tablet (0.25 mg total) by mouth 3 (three) times daily as needed.     aspirin 81 MG tablet  Take 81 mg by mouth daily.  baclofen 10 MG tablet  Commonly known as:  LIORESAL  Take 0.5 to 1 tablet at bedtime     cholecalciferol 1000 units tablet  Commonly known as:  VITAMIN D  Take 1,000 Units by mouth daily.     ciprofloxacin 500 MG tablet  Commonly known as:  CIPRO  Take 1 tablet (500 mg total) by mouth 2 (two) times daily.     glyBURIDE 2.5 MG tablet  Commonly known as:  DIABETA  Take 1 tablet (2.5 mg total) by mouth daily with breakfast.     losartan-hydrochlorothiazide 100-12.5 MG tablet  Commonly known as:  HYZAAR  Take 1 tablet by mouth daily.     metFORMIN 1000 MG tablet  Commonly known as:  GLUCOPHAGE  Take by mouth. 1 TABLET WITH BREAKFAST, 1/2 WITH LUNCH, 1/2 WITH DINNER     metroNIDAZOLE 500 MG tablet  Commonly known as:  FLAGYL  Take 1 tablet (500 mg total) by mouth 3 (three) times daily.     traMADol 50 MG tablet  Commonly known as:  ULTRAM  Take 1 tablet (50 mg total) by mouth every 12 (twelve) hours as needed.           Objective:   Physical Exam BP 132/56 mmHg  Pulse 88  Temp(Src) 98.2 F (36.8 C)  (Oral)  Ht 5\' 2"  (1.575 m)  Wt 151 lb (68.493 kg)  BMI 27.61 kg/m2  SpO2 99%  LMP 04/17/1988 General:   Well developed, well nourished . NAD.  HEENT:  Normocephalic . Face symmetric, atraumatic Lungs:  CTA B Normal respiratory effort, no intercostal retractions, no accessory muscle use. Heart: RRR,  no murmur.  no pretibial edema bilaterally  Abdomen:  Not distended, slightly TTP at the left lower quadrant without mass, rebound or rigidity. + Bowel sounds. Digital rectal exam: Normal sphincter tone, vault is empty, no stools-blood-mucus.  Skin: Not pale. Not jaundice Hands: Normal in color, good capillary refill throughout Neurologic:  alert & oriented X3.  Speech normal, gait appropriate for age and unassisted Psych--  Cognition and judgment appear intact.  Cooperative with normal attention span and concentration.  Behavior appropriate. No anxious or depressed appearing.    Assessment & Plan:   Assessment > DM HTN Hyperlipidemia Anxiety GI: --Recurrent diverticulitis (one episode documented by a CT 11-2014) --Last colonoscopy 10-2013, Dr. Marina Goodell, + polyps, melanosis coli, tics --H. pylori 2001 DJD--back pain, knees Osteoporosis SCC, skin cancer NEURO --Carotid US 10-2013: No significant plaque, although ICA's are serpentine, bilaterally. 40-59% bilateral ICA stenosis by velocity criteria likely due to tortuosity. Patent vertebral arteries with antegrade flow.Normal subclavian arteries, bilaterally. -- Transinet dizziness, diplopia sx late 2016 , saw neuro >>> Dx w/ TIA/Stroke on clinical grounds  W/u:  Brain  MRI 03-2015 >> 2 mm saccular aneurysm at the right posterior communicating artery origin, saw  Neurology, recheck MRI 1 year CTA head, neck 05-14-2015: aneurysm confirmed, otherwise (-)  Plan: Diverticulitis: GI sx could be due to diverticulitis, historically she responds well to po abx;  I am somewhat concerned about the associated rectal mucus-blood as described  by the patient however she is not toxic, no diarrhea, and she does not have an acute abdomen on clinical grounds. Plan:  UA, urine culture, Cipro, Flagyl, ER if symptoms severe. Call if not gradually improving in the next 36 hours. See instructions Watch for low blood sugars, Cipro may interact with glyburide. ?Raynaud phenomena:  See review of systems, Raynaud phenomena? Recommend observation.

## 2015-07-05 NOTE — Telephone Encounter (Signed)
Left message for pt to call back  °

## 2015-07-05 NOTE — Progress Notes (Signed)
Pre visit review using our clinic review tool, if applicable. No additional management support is needed unless otherwise documented below in the visit note. 

## 2015-07-05 NOTE — Telephone Encounter (Signed)
Unable to reach phone busy.

## 2015-07-06 LAB — URINE CULTURE
Colony Count: NO GROWTH
Organism ID, Bacteria: NO GROWTH

## 2015-07-06 LAB — URINALYSIS, ROUTINE W REFLEX MICROSCOPIC
Bilirubin Urine: NEGATIVE
Hgb urine dipstick: NEGATIVE
Ketones, ur: NEGATIVE
Leukocytes, UA: NEGATIVE
Nitrite: NEGATIVE
RBC / HPF: NONE SEEN (ref 0–?)
Specific Gravity, Urine: <=1.005 — AB (ref 1.000–1.030)
Total Protein, Urine: NEGATIVE
Urine Glucose: NEGATIVE
Urobilinogen, UA: 0.2 (ref 0.0–1.0)
WBC, UA: NONE SEEN (ref 0–?)
pH: 7 (ref 5.0–8.0)

## 2015-07-06 NOTE — Assessment & Plan Note (Signed)
Diverticulitis: GI sx could be due to diverticulitis, historically she responds well to po abx;  I am somewhat concerned about the associated rectal mucus-blood as described by the patient however she is not toxic, no diarrhea, and she does not have an acute abdomen on clinical grounds. Plan:  UA, urine culture, Cipro, Flagyl, ER if symptoms severe. Call if not gradually improving in the next 36 hours. See instructions Watch for low blood sugars, Cipro may interact with glyburide. ?Raynaud phenomena:  See review of systems, Raynaud phenomena? Recommend observation.

## 2015-07-07 ENCOUNTER — Telehealth: Payer: Self-pay

## 2015-07-07 ENCOUNTER — Other Ambulatory Visit: Payer: Self-pay

## 2015-07-07 ENCOUNTER — Ambulatory Visit (HOSPITAL_COMMUNITY): Payer: Medicare Other | Attending: Cardiology

## 2015-07-07 DIAGNOSIS — I639 Cerebral infarction, unspecified: Secondary | ICD-10-CM | POA: Diagnosis not present

## 2015-07-07 DIAGNOSIS — I340 Nonrheumatic mitral (valve) insufficiency: Secondary | ICD-10-CM | POA: Diagnosis not present

## 2015-07-07 DIAGNOSIS — E119 Type 2 diabetes mellitus without complications: Secondary | ICD-10-CM | POA: Insufficient documentation

## 2015-07-07 DIAGNOSIS — G459 Transient cerebral ischemic attack, unspecified: Secondary | ICD-10-CM | POA: Diagnosis not present

## 2015-07-07 DIAGNOSIS — I119 Hypertensive heart disease without heart failure: Secondary | ICD-10-CM | POA: Diagnosis not present

## 2015-07-07 DIAGNOSIS — E785 Hyperlipidemia, unspecified: Secondary | ICD-10-CM | POA: Diagnosis not present

## 2015-07-07 DIAGNOSIS — I071 Rheumatic tricuspid insufficiency: Secondary | ICD-10-CM | POA: Diagnosis not present

## 2015-07-07 DIAGNOSIS — I371 Nonrheumatic pulmonary valve insufficiency: Secondary | ICD-10-CM | POA: Insufficient documentation

## 2015-07-07 NOTE — Telephone Encounter (Signed)
-----   Message from Pieter Partridge, DO sent at 07/07/2015  3:07 PM EDT ----- Echocardiogram is unremarkable

## 2015-07-07 NOTE — Telephone Encounter (Signed)
Pt was seen by her PCP.

## 2015-07-07 NOTE — Telephone Encounter (Signed)
Results were left on pt's voicemail, with instructions to call back with any questions or concerns in relation to results.   

## 2015-07-08 ENCOUNTER — Telehealth: Payer: Self-pay

## 2015-07-08 NOTE — Telephone Encounter (Signed)
-----   Message from Pieter Partridge, DO sent at 07/07/2015  3:07 PM EDT ----- Echocardiogram is unremarkable

## 2015-07-08 NOTE — Telephone Encounter (Signed)
Message relayed. Pt verbalized understanding, denied questions.

## 2015-07-30 ENCOUNTER — Inpatient Hospital Stay (HOSPITAL_COMMUNITY): Payer: Medicare Other

## 2015-07-30 ENCOUNTER — Inpatient Hospital Stay (HOSPITAL_BASED_OUTPATIENT_CLINIC_OR_DEPARTMENT_OTHER)
Admission: EM | Admit: 2015-07-30 | Discharge: 2015-07-31 | DRG: 149 | Disposition: A | Discharge status: Home / Self Care | Payer: Medicare Other | Attending: Family Medicine | Admitting: Family Medicine

## 2015-07-30 ENCOUNTER — Emergency Department (HOSPITAL_BASED_OUTPATIENT_CLINIC_OR_DEPARTMENT_OTHER): Payer: Medicare Other

## 2015-07-30 ENCOUNTER — Encounter (HOSPITAL_BASED_OUTPATIENT_CLINIC_OR_DEPARTMENT_OTHER): Payer: Self-pay | Admitting: Emergency Medicine

## 2015-07-30 DIAGNOSIS — M1990 Unspecified osteoarthritis, unspecified site: Secondary | ICD-10-CM | POA: Diagnosis not present

## 2015-07-30 DIAGNOSIS — Z7984 Long term (current) use of oral hypoglycemic drugs: Secondary | ICD-10-CM

## 2015-07-30 DIAGNOSIS — E785 Hyperlipidemia, unspecified: Secondary | ICD-10-CM | POA: Diagnosis present

## 2015-07-30 DIAGNOSIS — Z85828 Personal history of other malignant neoplasm of skin: Secondary | ICD-10-CM

## 2015-07-30 DIAGNOSIS — R42 Dizziness and giddiness: Secondary | ICD-10-CM | POA: Diagnosis not present

## 2015-07-30 DIAGNOSIS — H5500 Unspecified nystagmus: Secondary | ICD-10-CM | POA: Diagnosis present

## 2015-07-30 DIAGNOSIS — E869 Volume depletion, unspecified: Secondary | ICD-10-CM | POA: Diagnosis present

## 2015-07-30 DIAGNOSIS — H532 Diplopia: Secondary | ICD-10-CM | POA: Diagnosis not present

## 2015-07-30 DIAGNOSIS — F419 Anxiety disorder, unspecified: Secondary | ICD-10-CM | POA: Diagnosis present

## 2015-07-30 DIAGNOSIS — M810 Age-related osteoporosis without current pathological fracture: Secondary | ICD-10-CM | POA: Diagnosis not present

## 2015-07-30 DIAGNOSIS — I7300 Raynaud's syndrome without gangrene: Secondary | ICD-10-CM | POA: Diagnosis present

## 2015-07-30 DIAGNOSIS — Z853 Personal history of malignant neoplasm of breast: Secondary | ICD-10-CM

## 2015-07-30 DIAGNOSIS — E119 Type 2 diabetes mellitus without complications: Secondary | ICD-10-CM | POA: Diagnosis present

## 2015-07-30 DIAGNOSIS — H814 Vertigo of central origin: Secondary | ICD-10-CM | POA: Diagnosis present

## 2015-07-30 DIAGNOSIS — R1013 Epigastric pain: Secondary | ICD-10-CM | POA: Diagnosis not present

## 2015-07-30 DIAGNOSIS — N179 Acute kidney failure, unspecified: Secondary | ICD-10-CM | POA: Diagnosis not present

## 2015-07-30 DIAGNOSIS — I10 Essential (primary) hypertension: Secondary | ICD-10-CM | POA: Diagnosis present

## 2015-07-30 DIAGNOSIS — R079 Chest pain, unspecified: Secondary | ICD-10-CM | POA: Diagnosis not present

## 2015-07-30 DIAGNOSIS — H8309 Labyrinthitis, unspecified ear: Principal | ICD-10-CM | POA: Diagnosis present

## 2015-07-30 LAB — CBC
HCT: 40.9 % (ref 36.0–46.0)
HCT: 40 % (ref 36.0–46.0)
Hemoglobin: 13.8 g/dL (ref 12.0–15.0)
Hemoglobin: 13.2 g/dL (ref 12.0–15.0)
MCH: 30.5 pg (ref 26.0–34.0)
MCH: 29.6 pg (ref 26.0–34.0)
MCHC: 33.7 g/dL (ref 30.0–36.0)
MCHC: 33 g/dL (ref 30.0–36.0)
MCV: 90.3 fL (ref 78.0–100.0)
MCV: 89.7 fL (ref 78.0–100.0)
Platelets: 328 10*3/uL (ref 150–400)
Platelets: 296 10*3/uL (ref 150–400)
RBC: 4.53 MIL/uL (ref 3.87–5.11)
RBC: 4.46 MIL/uL (ref 3.87–5.11)
RDW: 12.8 % (ref 11.5–15.5)
RDW: 12.5 % (ref 11.5–15.5)
WBC: 10.5 10*3/uL (ref 4.0–10.5)
WBC: 10.3 10*3/uL (ref 4.0–10.5)

## 2015-07-30 LAB — COMPREHENSIVE METABOLIC PANEL
ALT: 25 U/L (ref 14–54)
AST: 35 U/L (ref 15–41)
Albumin: 4.5 g/dL (ref 3.5–5.0)
Alkaline Phosphatase: 62 U/L (ref 38–126)
Anion gap: 11 (ref 5–15)
BUN: 21 mg/dL — ABNORMAL HIGH (ref 6–20)
CO2: 24 mmol/L (ref 22–32)
Calcium: 10.6 mg/dL — ABNORMAL HIGH (ref 8.9–10.3)
Chloride: 103 mmol/L (ref 101–111)
Creatinine, Ser: 0.88 mg/dL (ref 0.44–1.00)
GFR calc Af Amer: >60 mL/min (ref >60)
GFR calc non Af Amer: >60 mL/min (ref >60)
Glucose, Bld: 175 mg/dL — ABNORMAL HIGH (ref 65–99)
Potassium: 4.1 mmol/L (ref 3.5–5.1)
Sodium: 138 mmol/L (ref 135–145)
Total Bilirubin: 0.7 mg/dL (ref 0.3–1.2)
Total Protein: 8.1 g/dL (ref 6.5–8.1)

## 2015-07-30 LAB — URINALYSIS, ROUTINE W REFLEX MICROSCOPIC
Bilirubin Urine: NEGATIVE
Glucose, UA: NEGATIVE mg/dL
Hgb urine dipstick: NEGATIVE
Ketones, ur: 15 mg/dL — AB
Leukocytes, UA: NEGATIVE
Nitrite: NEGATIVE
Protein, ur: NEGATIVE mg/dL
Specific Gravity, Urine: 1.015 (ref 1.005–1.030)
pH: 8.5 — ABNORMAL HIGH (ref 5.0–8.0)

## 2015-07-30 LAB — URINE MICROSCOPIC-ADD ON: WBC, UA: NONE SEEN WBC/hpf (ref 0–5)

## 2015-07-30 LAB — CBG MONITORING, ED: Glucose-Capillary: 166 mg/dL — ABNORMAL HIGH (ref 65–99)

## 2015-07-30 LAB — CREATININE, SERUM
Creatinine, Ser: 1.01 mg/dL — ABNORMAL HIGH (ref 0.44–1.00)
GFR calc Af Amer: 60 mL/min — ABNORMAL LOW (ref >60)
GFR calc non Af Amer: 52 mL/min — ABNORMAL LOW (ref >60)

## 2015-07-30 LAB — TROPONIN I: Troponin I: <0.03 ng/mL (ref <0.031)

## 2015-07-30 MED ORDER — ONDANSETRON HCL 4 MG/2ML IJ SOLN
4.0000 mg | Freq: Once | INTRAMUSCULAR | Status: AC
  Administered 2015-07-30: 4 mg via INTRAVENOUS
  Filled 2015-07-30: qty 2

## 2015-07-30 MED ORDER — ONDANSETRON HCL 4 MG/2ML IJ SOLN
4.0000 mg | Freq: Once | INTRAMUSCULAR | Status: AC
  Administered 2015-07-30: 4 mg via INTRAVENOUS

## 2015-07-30 MED ORDER — ONDANSETRON HCL 4 MG/2ML IJ SOLN
4.0000 mg | INTRAMUSCULAR | Status: DC
  Administered 2015-07-30 – 2015-07-31 (×3): 4 mg via INTRAVENOUS
  Filled 2015-07-30 (×3): qty 2

## 2015-07-30 MED ORDER — MECLIZINE HCL 25 MG PO TABS
25.0000 mg | ORAL_TABLET | Freq: Once | ORAL | Status: AC
  Administered 2015-07-30: 25 mg via ORAL
  Filled 2015-07-30: qty 1

## 2015-07-30 MED ORDER — ZOLPIDEM TARTRATE 5 MG PO TABS
5.0000 mg | ORAL_TABLET | Freq: Every evening | ORAL | Status: DC | PRN
  Administered 2015-07-30: 5 mg via ORAL
  Filled 2015-07-30: qty 1

## 2015-07-30 MED ORDER — ONDANSETRON HCL 4 MG/2ML IJ SOLN
INTRAMUSCULAR | Status: AC
  Administered 2015-07-30: 4 mg via INTRAVENOUS
  Filled 2015-07-30: qty 2

## 2015-07-30 MED ORDER — DEXTROSE-NACL 5-0.9 % IV SOLN
INTRAVENOUS | Status: DC
Start: 2015-07-30 — End: 2015-07-31
  Administered 2015-07-30: 18:00:00 via INTRAVENOUS

## 2015-07-30 MED ORDER — DIAZEPAM 5 MG/ML IJ SOLN: 2.5000 mg | Freq: Four times a day (QID) | INTRAMUSCULAR | Status: DC | PRN

## 2015-07-30 MED ORDER — ONDANSETRON HCL 4 MG/2ML IJ SOLN
4.0000 mg | Freq: Four times a day (QID) | INTRAMUSCULAR | Status: DC
  Administered 2015-07-30: 4 mg via INTRAVENOUS
  Filled 2015-07-30: qty 2

## 2015-07-30 MED ORDER — ASPIRIN EC 325 MG PO TBEC
325.0000 mg | DELAYED_RELEASE_TABLET | Freq: Every day | ORAL | Status: DC
  Administered 2015-07-30 – 2015-07-31 (×2): 325 mg via ORAL
  Filled 2015-07-30 (×2): qty 1

## 2015-07-30 MED ORDER — DIAZEPAM 5 MG/ML IJ SOLN
2.5000 mg | Freq: Once | INTRAMUSCULAR | Status: AC
  Administered 2015-07-30: 2.5 mg via INTRAVENOUS
  Filled 2015-07-30: qty 2

## 2015-07-30 MED ORDER — ENOXAPARIN SODIUM 40 MG/0.4ML ~~LOC~~ SOLN
40.0000 mg | SUBCUTANEOUS | Status: DC
  Administered 2015-07-30: 40 mg via SUBCUTANEOUS
  Filled 2015-07-30: qty 0.4

## 2015-07-30 NOTE — ED Notes (Signed)
Pt resting quietly, arouses easily, denies any pain or other c/o

## 2015-07-30 NOTE — ED Notes (Signed)
RT called to room, SAT's in the 50's with good wave form. Placed patient on 4 LNC, instructed to take good deep breaths and SAT returned to the 90's. Husband stated that she is more sleepy than before. Spoke with MD who said she thinks this may be a reaction to medication. RT will continue to monitor.

## 2015-07-30 NOTE — ED Notes (Signed)
Pt awoke at 0300 with left arm pain and vomiting that has persisted since and increased in intensity

## 2015-07-30 NOTE — H&P (Signed)
35 ? Prior history diverticulitis recently treated 07/06/15 ? Raynaud's phenomenon Diabetes mellitus type 2 on metformin History intraductal papilloma Biopsy negative X2 1970s, 1990s History's, cell CA 03/2011 Hyperlipidemia  Had episodic vertigo and has had spells where she feels dizzy-she was evaluated by neurology most recently 06/11/15 and found to have multilevel degenerative disc disease-MRA of the head 04/05/2015 did not correlate any findings as seen on prior MRI and she did have episodic vertigo 05/02/2015 as well-noted that she had also been seen by an eye doctor and was told to have "blood in the back of her eye" Also seen on CTA of the head and neck was a small 2-3 mm right Pcomm, and she was referred to endovascular surgery?--sounds like saw Dr. Lajuana Ripple who didn't have any specific surgical rec  Patient started to have episodic nausea vomiting once again 07/29/2015 states that opening her eyes makes it worse No specific head movement worsens the instead just keeping her eyes opens work since it She has had no fever no chills She's had no diarrhea She's had no ill contacts She is not weak on any one side of the body She is able to track but I did not do this for fear of harming her or causing her further nausea or discomfort.  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 Married to her husband Originally from [place]   Father had CAD Mother had heart issues as well Never smoker never drinker   Alert pleasant oriented no apparent distress but refusing to open eyes given fair of nausea No JVD Moderate dentition When she does open her eyes they seem intact and pupils are equal bilaterally mild pallor  no JVD S1-S2 no murmur rub or gallop abdomen soft nontender nondistended Reflexes upper extremities are somewhat attenuated however I'm very brisk in lower extremities I did not examine plantar response I did not examine her  gait and her strength is 5/5 and sensory seems intact although it was only a cursory examination at the time   Assessment/plan  Central vertigo-have asked neurology to see. We will start with Valium 2.5 every 6 when necessary for intractable nausea and continue IV Zofran 4 mg scheduled She only requires a MedSurg bed Because she's not eating and is nauseous we can allow a heart healthy diet if she feels able to however we should keep her on dextrose 75 cc/h with normal saline  Volume depletion BUN 21 above normal baseline-hydrated with IV saline as above  History of diet controlled/medication controlled diabetes mellitus-hold off on glycemic control for now. Monitor trends and if above 200 will start coverage  Prior history's, cell carcinoma-outpatient management  Prior history of breast cancer, needs outpatient screening as per guidelines   Full code 45 minutes Updated husband at bedside when he was present there  Verneita Griffes, MD [facility] [phone]

## 2015-07-30 NOTE — ED Notes (Signed)
MD at bedside. 

## 2015-07-30 NOTE — ED Provider Notes (Signed)
CSN: [id number]     Arrival date & time 07/30/15  0930 History   First MD Initiated Contact with Patient 07/30/15 [phone]     Chief Complaint  Patient presents with  . Emesis  . Dizziness    Patient is a 82 y.o. female presenting with vomiting and dizziness. The history is provided by the patient. No language interpreter was used.  Emesis Dizziness Associated symptoms: vomiting    Patricia Moore is a 82 y.o. female who presents to the Emergency Department complaining of vomiting, dizziness.  History is provided by the patient and her husband. She awoke at 3 AM with severe nausea, vomiting, dizziness and left mid arm pain. He has no associated light headache. She denies any numbness, weakness, chest pain, abdominal pain, diarrhea, sick contacts. She had a similar episode one month ago and she did not seek treatment at that time. She did see her PCP and there was a concern for possible TIA. She did follow up with an MRI that was negative. She has a history of hypertension and diabetes, no other significant medical problems. Her arm pain has since resolved but she does have ongoing nausea, vomiting, dizziness.  Past Medical History  Diagnosis Date  . Allergy   . Hypertension   . Osteoporosis   . Diabetes mellitus   . Back pain   . Arthritis   . Hx of adenomatous colonic polyps   . Diverticulosis   . Hyperlipidemia   . H. pylori infection 2001  . Intraductal papilloma     bx nef x 2 in the 70s-90s  . SCC (squamous cell carcinoma) 03/2011    sees dermatology  . Diverticulitis     patient states has had three attacks   . Anxiety 12/09/2013  . DJD (degenerative joint disease) 12/14/2014   Past Surgical History  Procedure Laterality Date  . Breast biopsy  '91, '95    INTRADUCTAL PAPILLOMA  . Breast lumpectomy    . Esophagogastroduodenoscopy    . Colonoscopy         . Cataract extraction Left 09-2014   Family History  Problem Relation Age of Onset  . Adopted: Yes  . Hypertension Neg  Hx   . Diabetes Neg Hx   . Heart disease Mother 46  . Heart disease Father 5  . Breast cancer Maternal Aunt   . Stroke Maternal Aunt   . Colon cancer Paternal Uncle     7 uncles  . Colon cancer Maternal Grandfather     COLON,, FAMILY HX 7 MEMBERS  . Breast cancer Maternal Aunt   . Cancer Sister     skin   Social History  Substance Use Topics  . Smoking status: Never Smoker   . Smokeless tobacco: Never Used  . Alcohol Use: 0.6 oz/week    1 Glasses of wine per week     Comment: OCCASIONALLY   OB History    Gravida Para Term Preterm AB TAB SAB Ectopic Multiple Living   2 1   1  1   1      Review of Systems  Gastrointestinal: Positive for vomiting.  Neurological: Positive for dizziness.  All other systems reviewed and are negative.     Allergies  Review of patient's allergies indicates no known allergies.  Home Medications   Prior to Admission medications   Medication Sig Start Date End Date Taking? Authorizing Provider  ALPRAZolam (XANAX) 0.25 MG tablet Take 1 tablet (0.25 mg total) by mouth 3 (three) times  daily as needed. 01/18/15  Yes Colon Branch, MD  aspirin 81 MG tablet Take 81 mg by mouth daily.   Yes Historical Provider, MD  baclofen (LIORESAL) 10 MG tablet Take 0.5 to 1 tablet at bedtime 06/11/15  Yes Pieter Partridge, DO  glyBURIDE (DIABETA) 2.5 MG tablet Take 1 tablet (2.5 mg total) by mouth daily with breakfast. 07/09/14  Yes Mackie Pai, PA-C  losartan-hydrochlorothiazide (HYZAAR) 100-12.5 MG tablet Take 1 tablet by mouth daily. 05/07/15  Yes Colon Branch, MD  metFORMIN (GLUCOPHAGE) 1000 MG tablet Take by mouth. 1 TABLET WITH BREAKFAST, 1/2 WITH LUNCH, 1/2 WITH DINNER   Yes Historical Provider, MD  cholecalciferol (VITAMIN D) 1000 UNITS tablet Take 1,000 Units by mouth daily.    Historical Provider, MD  traMADol (ULTRAM) 50 MG tablet Take 1 tablet (50 mg total) by mouth every 12 (twelve) hours as needed. 04/15/15   Colon Branch, MD   BP 132/68 mmHg  Pulse 81   Temp(Src) 98 F (36.7 C) (Oral)  Resp 18  Wt 148 lb (67.132 kg)  SpO2 100%  LMP 04/17/1988 Physical Exam  Constitutional: She is oriented to person, place, and time. She appears well-developed and well-nourished.  Uncomfortable appearing  HENT:  Head: Normocephalic and atraumatic.  Eyes: Pupils are equal, round, and reactive to light.  Persistent horizontal nystagmus to the left  Cardiovascular: Normal rate and regular rhythm.   No murmur heard. Pulmonary/Chest: Effort normal and breath sounds normal. No respiratory distress.  Abdominal: Soft. There is no rebound and no guarding.  Mild epigastric tenderness  Musculoskeletal: She exhibits no edema or tenderness.  Neurological: She is alert and oriented to person, place, and time.  Persistent horizontal nystagmus to the left.  5/5 strength in all four extremities.  Unable to ambulate due to dizziness.    Skin: Skin is warm and dry.  Psychiatric: She has a normal mood and affect. Her behavior is normal.  Nursing note and vitals reviewed.   ED Course  Procedures (including critical care time) Labs Review Labs Reviewed  COMPREHENSIVE METABOLIC PANEL - Abnormal; Notable for the following:    Glucose, Bld 175 (*)    BUN 21 (*)    Calcium 10.6 (*)    All other components within normal limits  URINALYSIS, ROUTINE W REFLEX MICROSCOPIC (NOT AT [facility]) - Abnormal; Notable for the following:    APPearance TURBID (*)    pH 8.5 (*)    Ketones, ur 15 (*)    All other components within normal limits  URINE MICROSCOPIC-ADD ON - Abnormal; Notable for the following:    Squamous Epithelial / LPF 0-5 (*)    Bacteria, UA RARE (*)    All other components within normal limits  CBG MONITORING, ED - Abnormal; Notable for the following:    Glucose-Capillary 166 (*)    All other components within normal limits  CBC  TROPONIN I    Imaging Review Dg Chest 2 View  07/30/2015  CLINICAL DATA:  Dizziness and left-sided chest pain associated with  vomiting EXAM: CHEST  2 VIEW COMPARISON:  PA and lateral chest x-ray of May 21, 2013. FINDINGS: The lungs are adequately inflated. There is no focal infiltrate. There is no pleural effusion. The heart is normal in size. The pulmonary vascularity is not engorged. The mediastinum is normal in width. The trachea is midline. The bony thorax exhibits no acute abnormality. IMPRESSION: There is no active cardiopulmonary disease. Electronically Signed   By: David  Martinique  M.D.   On: 07/30/2015 10:41   Ct Head Wo Contrast  07/30/2015  CLINICAL DATA:  Dizziness starting at 3 a.m. with vomiting. EXAM: CT HEAD WITHOUT CONTRAST TECHNIQUE: Contiguous axial images were obtained from the base of the skull through the vertex without intravenous contrast. COMPARISON:  05/14/2015 FINDINGS: Skull and Sinuses:Negative for fracture or destructive process. Chronic mucosal thickening or debris on the intersinus septum of the left sphenoid sinus. Visualized orbits: Left cataract resection. Brain: No evidence of acute infarction, hemorrhage, hydrocephalus, or mass lesion/mass effect. Mild generalized cerebral volume loss that is expected for age. IMPRESSION: Unremarkable head CT for age. Electronically Signed   By: Monte Fantasia M.D.   On: 07/30/2015 13:33   I have personally reviewed and evaluated these images and lab results as part of my medical decision-making.   EKG Interpretation   Date/Time:  Friday July 30 2015 09:49:23 EDT Ventricular Rate:  67 PR Interval:  164 QRS Duration: 96 QT Interval:  396 QTC Calculation: 418 R Axis:   -8 Text Interpretation:  Sinus rhythm Borderline repolarization abnormality  Confirmed by Hazle Coca [phone]) on 07/30/2015 9:58:42 AM      MDM   Final diagnoses:  Vertigo   Patient here for dizziness and vertiginous type symptoms. She has horizontal nystagmus to the left on examination. Attempted to treat her dizziness and vertigo with Valium. She had temporary improvement in her  nystagmus but was persistently vertiginous and was unable to sit up. She did have transient hypoxia to the 50s that improved with supplemental oxygen. She was able to be removed from supplemental oxygen. Also attempted Zofran and meclizine for her vertigo without any improvement. Discussed with hospitalist regarding admission for observation given her ongoing symptoms.   Quintella Reichert, MD 07/30/15 1538

## 2015-07-30 NOTE — Consult Note (Signed)
Neurology Consultation Reason for Consult: Vertigo Referring Physician: Nada Libman  CC: Vertigo  History is obtained from: Patient  HPI: Patricia Moore is a 82 y.o. female with a history of single previous episode of vertigo. At the time of her previous episode, she also had diplopia and it was felt by Dr. Tomi Likens who evaluated her after the fact that it might be TIA. 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 A1c was 7.5. LDL is 99.   LKW: 4/13 prior to bed tpa given?: no, out of window    ROS: A 14 point ROS was performed and is negative except as noted in the HPI.   Past Medical History  Diagnosis Date  . Allergy   . Hypertension   . Osteoporosis   . Diabetes mellitus   . Back pain   . Arthritis   . Hx of adenomatous colonic polyps   . Diverticulosis   . Hyperlipidemia   . H. pylori infection 2001  . Intraductal papilloma     bx nef x 2 in the 70s-90s  . SCC (squamous cell carcinoma) 03/2011    sees dermatology  . Diverticulitis     patient states has had three attacks   . Anxiety 12/09/2013  . DJD (degenerative joint disease) 12/14/2014     Family History  Problem Relation Age of Onset  . Adopted: Yes  . Hypertension Neg Hx   . Diabetes Neg Hx   . Heart disease Mother 68  . Heart disease Father 61  . Breast cancer Maternal Aunt   . Stroke Maternal Aunt   . Colon cancer Paternal Uncle     7 uncles  . Colon cancer Maternal Grandfather     COLON,, FAMILY HX 7 MEMBERS  . Breast cancer Maternal Aunt   . Cancer Sister     skin     Social History:  reports that she has never smoked. She has never used smokeless tobacco. She reports that she drinks about 0.6 oz of alcohol per week. She reports that she does not use illicit drugs.   Exam: Current vital signs: BP 149/56 mmHg  Pulse 75  Temp(Src) 97.7 F (36.5 C) (Oral)  Resp 18  Ht 5\' 2"  (1.575 m)  Wt 67.132 kg (148 lb)  BMI 27.06 kg/m2  SpO2 100%  LMP  04/17/1988 Vital signs in last 24 hours: Temp:  [97.7 F (36.5 C)-98 F (36.7 C)] 97.7 F (36.5 C) (04/14 1706) Pulse Rate:  [65-81] 75 (04/14 1706) Resp:  [16-18] 18 (04/14 1225) BP: (112-149)/(55-105) 149/56 mmHg (04/14 1706) SpO2:  [93 %-100 %] 100 % (04/14 1706) Weight:  [67.132 kg (148 lb)] 67.132 kg (148 lb) (04/14 1706)   Physical Exam  Constitutional: Appears well-developed and well-nourished.  Psych: Affect appropriate to situation Eyes: No scleral injection HENT: No OP obstrucion Head: Normocephalic.  Cardiovascular: Normal rate and regular rhythm.  Respiratory: Effort normal and breath sounds normal to anterior ascultation GI: Soft.  No distension. There is no tenderness.  Skin: WDI  Neuro: Mental Status: Patient is awake, alert, oriented to person, place, month, year, and situation. Patient is able to give a clear and coherent history. No signs of aphasia or neglect Cranial Nerves: II: Visual Fields are full. Pupils are equal, round, and reactive to light.   III,IV, VI: She has prominent right beating nystagmus that is present both on right gaze and upgaze. There is less noticeable on leftward gaze. She does not  have a clear positive head thrust test, though her nystagmus becomes much more prominent when testing this which could obscure subtle saccadic corrections. V: Facial sensation is symmetric to temperature VII: Facial movement is symmetric.  VIII: hearing is intact to voice X: Uvula elevates symmetrically XI: Shoulder shrug is symmetric. XII: tongue is midline without atrophy or fasciculations.  Motor: Tone is normal. Bulk is normal. 5/5 strength was present in all four extremities.  Sensory: Sensation is symmetric to light touch and temperature in the arms and legs. Cerebellar: FNF and HKS are intact bilaterally  Dix-Hallpike is negative bilaterally  I have reviewed labs in epic and the results pertinent to this consultation are: CMP-relatively  unremarkable  I have reviewed the images obtained: CT head- no acute findings  Impression: 82 year old female with a history of single previous episode of diplopia with vertigo now presents with persistent vertigo/nausea and vomiting. I do suspect that this represents likely small posterior circulation infarct. The other possibility being labyrinthitis, though the previous episode makes me think that a small infarct is more likely.  BPPV I think is less likely given the persistent symptoms even without movement.  Recommendations: 1) MRI brain 2) complete stroke workup if positive, would consider steroids for labyrinthitis is negative.   Roland Rack, MD [facility] [phone]  If 7pm- 7am, please page neurology on call as listed in [facility].

## 2015-07-30 NOTE — ED Notes (Signed)
Report to Elmyra Ricks and [facility] staff at bedside, pt care transferred to [facility]. Remo Lipps, RN on 6n called with report. Pt is awake and alert, states she is still dizzy when she opens her eyes "but it is much better now."

## 2015-07-30 NOTE — ED Notes (Signed)
Pt assisted to sit from supine position, immediately c/o "severe dizzyness" and vomiting into emesis bag. Pt assisted to bedside commode for voiding, dr. Ralene Bathe alerted to pt inability to tolerate ambulation. Cc urine sample obtained, pt assisted back into position of comfort.

## 2015-07-30 NOTE — ED Notes (Signed)
Pt on automatic VS, continuous pulse ox and cardiac monitoring. Family at bedside.

## 2015-07-30 NOTE — ED Notes (Signed)
Unable to ambulate pt d/t dizziness. Dr Ralene Bathe aware.

## 2015-07-31 LAB — COMPREHENSIVE METABOLIC PANEL
ALT: 18 U/L (ref 14–54)
AST: 20 U/L (ref 15–41)
Albumin: 3 g/dL — ABNORMAL LOW (ref 3.5–5.0)
Alkaline Phosphatase: 46 U/L (ref 38–126)
Anion gap: 8 (ref 5–15)
BUN: 11 mg/dL (ref 6–20)
CO2: 25 mmol/L (ref 22–32)
Calcium: 8.9 mg/dL (ref 8.9–10.3)
Chloride: 106 mmol/L (ref 101–111)
Creatinine, Ser: 0.89 mg/dL (ref 0.44–1.00)
GFR calc Af Amer: >60 mL/min (ref >60)
GFR calc non Af Amer: >60 mL/min (ref >60)
Glucose, Bld: 330 mg/dL — ABNORMAL HIGH (ref 65–99)
Potassium: 3.5 mmol/L (ref 3.5–5.1)
Sodium: 139 mmol/L (ref 135–145)
Total Bilirubin: 0.5 mg/dL (ref 0.3–1.2)
Total Protein: 6.1 g/dL — ABNORMAL LOW (ref 6.5–8.1)

## 2015-07-31 LAB — GLUCOSE, CAPILLARY: Glucose-Capillary: 242 mg/dL — ABNORMAL HIGH (ref 65–99)

## 2015-07-31 LAB — CBC
HCT: 34.5 % — ABNORMAL LOW (ref 36.0–46.0)
Hemoglobin: 11.2 g/dL — ABNORMAL LOW (ref 12.0–15.0)
MCH: 29.6 pg (ref 26.0–34.0)
MCHC: 32.5 g/dL (ref 30.0–36.0)
MCV: 91 fL (ref 78.0–100.0)
Platelets: 266 10*3/uL (ref 150–400)
RBC: 3.79 MIL/uL — ABNORMAL LOW (ref 3.87–5.11)
RDW: 12.8 % (ref 11.5–15.5)
WBC: 12.7 10*3/uL — ABNORMAL HIGH (ref 4.0–10.5)

## 2015-07-31 MED ORDER — PREDNISONE 20 MG PO TABS: 20.0000 mg | ORAL_TABLET | ORAL | Status: DC

## 2015-07-31 NOTE — Discharge Summary (Signed)
Physician Discharge Summary  Patricia Moore UJW:[id number] DOB: [birth year] DOA: 07/30/2015  PCP: Willow Ora, MD  Admit date: 07/30/2015 Discharge date: 07/31/2015  Time spent: 20 minutes  Recommendations for Outpatient Follow-up:  1. Complete steroid taper as directed for labyrinthitis   Discharge Diagnoses:  Active Problems:   Vertigo   Central nervous system origin vertigo   Discharge Condition: Improved  Diet recommendation: Diabetic  Filed Weights   07/30/15 0936 07/30/15 1706  Weight: 67.132 kg (148 lb) 67.132 kg (148 lb)    History of present illness:  55 ? Prior history diverticulitis recently treated 07/06/15 ? Raynaud's phenomenon Diabetes mellitus type 2 on metformin History intraductal papilloma Biopsy negative X2 1970s, 1990s History's, cell CA 03/2011 Hyperlipidemia  Seen by neurology in the outpatient setting and found to have episodic vertigo-presented 07/29/2015 to the med center and was having nystagmus as well as difficulty with eating and projectile vomiting  Neurology Dr. Amada Jupiter saw the patient in the hospital and recommended low-dose of steroids-she was hydrated and her acute kidney injury resolved and she was much improved on day of discharge and ambulating without dizziness or nausea  She should follow-up with her regular physician as an outpatient   Discharge Exam: Filed Vitals:   07/30/15 2250 07/31/15 0613  BP: 128/45 129/45  Pulse: 80 70  Temp: 97.6 F (36.4 C) 98.1 F (36.7 C)  Resp: 19 18    General: eomi ncat Cardiovascular: s1 s 2no m/r/g Respiratory: clear Moving approp  Discharge Instructions   Discharge Instructions    Diet - low sodium heart healthy    Complete by:  As directed      Discharge instructions    Complete by:  As directed   Complete steroids as directed Follow with regular MD Follow with Neurologist     Increase activity slowly    Complete by:  As directed           Current Discharge Medication List     START taking these medications   Details  predniSONE (DELTASONE) 20 MG tablet Take 1 tablet (20 mg total) by mouth as directed. 60mg  x5 days, 50x 1 day,  40 x 1 days, 30 x 1 day, 20 x 1 day,  10 once + stop.  Qty: 30 tablet, Refills: 0      CONTINUE these medications which have NOT CHANGED   Details  ALPRAZolam (XANAX) 0.25 MG tablet Take 1 tablet (0.25 mg total) by mouth 3 (three) times daily as needed. Qty: 30 tablet, Refills: 5    aspirin 81 MG tablet Take 81 mg by mouth daily.    baclofen (LIORESAL) 10 MG tablet Take 0.5 to 1 tablet at bedtime Qty: 30 each, Refills: 3    cholecalciferol (VITAMIN D) 1000 UNITS tablet Take 1,000 Units by mouth daily.    glyBURIDE (DIABETA) 2.5 MG tablet Take 1 tablet (2.5 mg total) by mouth daily with breakfast. Qty: 30 tablet, Refills: 5    losartan-hydrochlorothiazide (HYZAAR) 100-12.5 MG tablet Take 1 tablet by mouth daily. Qty: 30 tablet, Refills: 3    metFORMIN (GLUCOPHAGE) 1000 MG tablet Take by mouth. 1 TABLET WITH BREAKFAST, 1/2 WITH LUNCH, 1/2 WITH DINNER    traMADol (ULTRAM) 50 MG tablet Take 1 tablet (50 mg total) by mouth every 12 (twelve) hours as needed. Qty: 30 tablet, Refills: 0       No Known Allergies    The results of significant diagnostics from this hospitalization (including imaging, microbiology, ancillary and laboratory) are  listed below for reference.    Significant Diagnostic Studies: Dg Chest 2 View  07/30/2015  CLINICAL DATA:  Dizziness and left-sided chest pain associated with vomiting EXAM: CHEST  2 VIEW COMPARISON:  PA and lateral chest x-ray of May 21, 2013. FINDINGS: The lungs are adequately inflated. There is no focal infiltrate. There is no pleural effusion. The heart is normal in size. The pulmonary vascularity is not engorged. The mediastinum is normal in width. The trachea is midline. The bony thorax exhibits no acute abnormality. IMPRESSION: There is no active cardiopulmonary disease.  Electronically Signed   By: David  [place] M.D.   On: 07/30/2015 10:41   Ct Head Wo Contrast  07/30/2015  CLINICAL DATA:  Dizziness starting at 3 a.m. with vomiting. EXAM: CT HEAD WITHOUT CONTRAST TECHNIQUE: Contiguous axial images were obtained from the base of the skull through the vertex without intravenous contrast. COMPARISON:  05/14/2015 FINDINGS: Skull and Sinuses:Negative for fracture or destructive process. Chronic mucosal thickening or debris on the intersinus septum of the left sphenoid sinus. Visualized orbits: Left cataract resection. Brain: No evidence of acute infarction, hemorrhage, hydrocephalus, or mass lesion/mass effect. Mild generalized cerebral volume loss that is expected for age. IMPRESSION: Unremarkable head CT for age. Electronically Signed   By: Marnee Spring M.D.   On: 07/30/2015 13:33   Mr Brain Wo Contrast  07/30/2015  CLINICAL DATA:  Woke up at 3 a.m. with severe nausea, vomiting, dizziness and LEFT mid arm pain. Similar symptoms 1 month ago. History of hypertension and diabetes. EXAM: MRI HEAD WITHOUT CONTRAST TECHNIQUE: Multiplanar, multiecho pulse sequences of the brain and surrounding structures were obtained without intravenous contrast. COMPARISON:  MRI head February 23, 2015 and CT head July 30, 2015 at 1321 hours FINDINGS: The ventricles and sulci are normal for patient's age. No abnormal parenchymal signal, mass lesions, mass effect. No reduced diffusion to suggest acute ischemia. No susceptibility artifact to suggest hemorrhage. Punctate focus of susceptibility artifact LEFT cerebellum, no residual surrounding signal abnormality. No abnormal extra-axial fluid collections. No extra-axial masses though, contrast enhanced sequences would be more sensitive. Normal major intracranial vascular flow voids seen at the skull base. Status post LEFT ocular lens implant. No abnormal sellar expansion. No suspicious calvarial bone marrow signal. Craniocervical junction maintained.  Cerebellar tonsils at but not below the foramen magnum. Mild less sphenoid mucosal thickening without paranasal sinus air-fluid levels. Mastoid air cells are well aerated. IMPRESSION: Normal MRI head for age. Electronically Signed   By: Awilda Metro M.D.   On: 07/30/2015 22:38    Microbiology: No results found for this or any previous visit (from the past 240 hour(s)).   Labs: Basic Metabolic Panel:  Recent Labs Lab 07/30/15 0951 07/30/15 1824 07/31/15 0607  NA 138  --  139  K 4.1  --  3.5  CL 103  --  106  CO2 24  --  25  GLUCOSE 175*  --  330*  BUN 21*  --  11  CREATININE 0.88 1.01* 0.89  CALCIUM 10.6*  --  8.9   Liver Function Tests:  Recent Labs Lab 07/30/15 0951 07/31/15 0607  AST 35 20  ALT 25 18  ALKPHOS 62 46  BILITOT 0.7 0.5  PROT 8.1 6.1*  ALBUMIN 4.5 3.0*   No results for input(s): LIPASE, AMYLASE in the last 168 hours. No results for input(s): AMMONIA in the last 168 hours. CBC:  Recent Labs Lab 07/30/15 0951 07/30/15 1824 07/31/15 0607  WBC 10.5 10.3 12.7*  HGB 13.8 13.2 11.2*  HCT 40.9 40.0 34.5*  MCV 90.3 89.7 91.0  PLT 328 296 266   Cardiac Enzymes:  Recent Labs Lab 07/30/15 0951  TROPONINI <0.03   BNP: BNP (last 3 results) No results for input(s): BNP in the last 8760 hours.  ProBNP (last 3 results) No results for input(s): PROBNP in the last 8760 hours.  CBG:  Recent Labs Lab 07/30/15 1001 07/31/15 0746  GLUCAP 166* 242*       Signed:  Rhetta Mura MD   [facility] 07/31/2015, 1:27 PM

## 2015-07-31 NOTE — Progress Notes (Signed)
Subjective: Much improved this morning, but still somewhat dizzy.   Exam: Filed Vitals:   07/30/15 2250 07/31/15 0613  BP: 128/45 129/45  Pulse: 80 70  Temp: 97.6 F (36.4 C) 98.1 F (36.7 C)  Resp: 19 18   Gen: In bed, NAD Resp: non-labored breathing, no acute distress Abd: soft, nt  Neuro: MS: awake, alert, interactive [id number], EOMI Motor: MAEW\  No ataxia   Impression: 82 yo F with acute onset vertigo. Her persistent left beating nystagmsus could certainly be consistent with a peripheral vertigo and lack of MRI findings make me think that labrynthitis is the most likely culprit. I would favor a course of steroids, but the patient is adamant that she does not want to take them. These would speed time to improvement, but should not affect overall outcome.   Recommendations: 1) PT 2) If the patient were to change her mind and take steroids, then would use: Prednisone 60mg  x5 days, then 50x 1 day, then 40 x 1 days, 30 x 1 day, 20 x 1 day, then 10 once then stop.  3) No further recommendations at this time. Please call with any further questions.   Roland Rack, MD [facility] [phone]  If 7pm- 7am, please page neurology on call as listed in [place].

## 2015-07-31 NOTE — Progress Notes (Signed)
Angus Seller to be D/C'd  per MD order. Discussed with the patient and all questions fully answered.  VSS, Skin clean, dry and intact without evidence of skin break down, no evidence of skin tears noted.  IV catheter discontinued intact. Site without signs and symptoms of complications. Dressing and pressure applied.  An After Visit Summary was printed and given to the patient. Patient received prescription.  D/c education completed with patient/family including follow up instructions, medication list, d/c activities limitations if indicated, with other d/c instructions as indicated by MD - patient able to verbalize understanding, all questions fully answered.   Patient instructed to return to ED, call 911, or call MD for any changes in condition.   Patient to be escorted via [place], and D/C home via private auto.

## 2015-07-31 NOTE — Progress Notes (Signed)
PT Cancellation Note  Patient Details Name: Patricia Moore MRN: [id number] DOB: February 06, 1934   Cancelled Treatment:    Reason Eval/Treat Not Completed: Other (comment) (Patient discharged).  Order for PT consult at 12:27.  Arrived at 15:10 for PT evaluation and patient had been discharged.   Despina Pole 07/31/2015, 3:12 PM Carita Pian. Sanjuana Kava, [place] Pager [phone]

## 2015-08-02 ENCOUNTER — Telehealth: Payer: Self-pay | Admitting: Internal Medicine

## 2015-08-02 ENCOUNTER — Encounter: Payer: Self-pay | Admitting: Internal Medicine

## 2015-08-02 ENCOUNTER — Ambulatory Visit (INDEPENDENT_AMBULATORY_CARE_PROVIDER_SITE_OTHER): Payer: Medicare Other | Admitting: Internal Medicine

## 2015-08-02 VITALS — BP 136/60 | HR 81 | Temp 98.1°F | Ht 62.0 in | Wt 151.2 lb

## 2015-08-02 DIAGNOSIS — E119 Type 2 diabetes mellitus without complications: Secondary | ICD-10-CM | POA: Diagnosis not present

## 2015-08-02 DIAGNOSIS — Z09 Encounter for follow-up examination after completed treatment for conditions other than malignant neoplasm: Secondary | ICD-10-CM

## 2015-08-02 DIAGNOSIS — I10 Essential (primary) hypertension: Secondary | ICD-10-CM | POA: Diagnosis not present

## 2015-08-02 DIAGNOSIS — R42 Dizziness and giddiness: Secondary | ICD-10-CM | POA: Diagnosis not present

## 2015-08-02 MED ORDER — PREDNISONE 10 MG PO TABS: ORAL_TABLET | ORAL | Status: DC

## 2015-08-02 NOTE — Progress Notes (Signed)
Pre visit review using our clinic review tool, if applicable. No additional management support is needed unless otherwise documented below in the visit note. 

## 2015-08-02 NOTE — Patient Instructions (Addendum)
Call if syymptoms worsen  We are referring you to Dr Lucia Gaskins  If you decide to take prednisone, take the lower dose and check your blood sugars  If  the bloos sugar increase to more than 180: stop prednisone

## 2015-08-02 NOTE — Progress Notes (Signed)
Subjective:    Patient ID: Patricia Moore, female    DOB: Dec 20, 1933, 82 y.o.   MRN: [id number]  DOS:  08/02/2015 Type of visit - description : Follow-up, no TCM  Interval history: Admitted to the hospital 4-14- 2017 for 1 day She presented to the hospital with dizziness, on exam she had persistent nystagmus, neurology was consulted, MRI was nonacute, symptoms well felt to be secondary to labyrinthitis, was discharged home with physical therapy and a prednisone taper.Also recommended to see ENT. Results reviewed: Last BMP, LFTs satisfactory. CBC upon admission normal. Chest x-ray normal, CT head unremarkable, MRI brain normal   Review of Systems   Since she left the hospital, she decided not to take prednisone, she thinks is too strong. Dizziness and nausea have decreased, although today she had some symptoms, slightly more noticeable than yesterday. Ambulatory blood sugars before the admission excellent at 110, now around 130s. Appetite is normal, denies chest pain or difficulty breathing No diplopia, slurred speech or motor deficits   Past Medical History  Diagnosis Date  . Allergy   . Hypertension   . Osteoporosis   . Diabetes mellitus   . Back pain   . Arthritis   . Hx of adenomatous colonic polyps   . Diverticulosis   . Hyperlipidemia   . H. pylori infection 2001  . Intraductal papilloma     bx nef x 2 in the 70s-90s  . SCC (squamous cell carcinoma) 03/2011    sees dermatology  . Diverticulitis     patient states has had three attacks   . Anxiety 12/09/2013  . DJD (degenerative joint disease) 12/14/2014    Past Surgical History  Procedure Laterality Date  . Breast biopsy  '91, '95    INTRADUCTAL PAPILLOMA  . Breast lumpectomy    . Esophagogastroduodenoscopy    . Colonoscopy         . Cataract extraction Left 09-2014    Social History   Social History  . Marital Status: Married    Spouse Name: N/A  . Number of Children: 1  . Years of Education: N/A    Occupational History  . retired     Social History Main Topics  . Smoking status: Never Smoker   . Smokeless tobacco: Never Used  . Alcohol Use: 0.6 oz/week    1 Glasses of wine per week     Comment: OCCASIONALLY  . Drug Use: No  . Sexual Activity: No   Other Topics Concern  . Not on file   Social History Narrative   Born in [place], ,moved to the [place] in the 60s, live in [place] then in [place]   Lives w/ husband   H.S. graduate        Medication List       This list is accurate as of: 08/02/15  4:54 PM.  Always use your most recent med list.               ALPRAZolam 0.25 MG tablet  Commonly known as:  XANAX  Take 1 tablet (0.25 mg total) by mouth 3 (three) times daily as needed.     aspirin 81 MG tablet  Take 81 mg by mouth daily.     baclofen 10 MG tablet  Commonly known as:  LIORESAL  Take 0.5 to 1 tablet at bedtime     cholecalciferol 1000 units tablet  Commonly known as:  VITAMIN D  Take 1,000 Units by mouth daily.  glyBURIDE 2.5 MG tablet  Commonly known as:  DIABETA  Take 1 tablet (2.5 mg total) by mouth daily with breakfast.     losartan-hydrochlorothiazide 100-12.5 MG tablet  Commonly known as:  HYZAAR  Take 1 tablet by mouth daily.     metFORMIN 1000 MG tablet  Commonly known as:  GLUCOPHAGE  Take by mouth. 1 TABLET WITH BREAKFAST, 1/2 WITH LUNCH, 1/2 WITH DINNER     predniSONE 10 MG tablet  Commonly known as:  DELTASONE  3 tabs x 3 days, 2 tabs x 3 days, 1 tab x 3 days     traMADol 50 MG tablet  Commonly known as:  ULTRAM  Take 1 tablet (50 mg total) by mouth every 12 (twelve) hours as needed.           Objective:   Physical Exam BP 136/60 mmHg  Pulse 81  Temp(Src) 98.1 F (36.7 C) (Oral)  Ht 5\' 2"  (1.575 m)  Wt 151 lb 4 oz (68.607 kg)  BMI 27.66 kg/m2  SpO2 98%  LMP 04/17/1988 General:   Well developed, well nourished . NAD.  HEENT:  Normocephalic . Face symmetric, atraumatic Lungs:  CTA B Normal respiratory effort, no  intercostal retractions, no accessory muscle use. Heart: RRR,  no murmur.  No pretibial edema bilaterally  Skin: Not pale. Not jaundice Neurologic:  alert & oriented X3.  Speech normal, gait appropriate for age and unassisted EOMI. No nystagmus noted Psych--  Cognition and judgment appear intact.  Cooperative with normal attention span and concentration.  Behavior appropriate. No anxious or depressed appearing.      Assessment & Plan:   Assessment > DM HTN Hyperlipidemia Anxiety GI: --Recurrent diverticulitis (one episode documented by a CT 11-2014) --Last colonoscopy 10-2013, Dr. Marina Goodell, + polyps, melanosis coli, tics --H. pylori 2001 DJD--back pain, knees Osteoporosis SCC, skin cancer NEURO --Carotid US 10-2013: No significant plaque, although ICA's are serpentine, bilaterally. 40-59% bilateral ICA stenosis by velocity criteria likely due to tortuosity. Patent vertebral arteries with antegrade flow.Normal subclavian arteries, bilaterally. 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 See rx DM: Close CBG monitoring if she decides to take prednisone HTN: Well-controlled, recent BMP normal. She wonders if we should stop losartan HCT as dizziness is listed as a s/e but a relationship between losartan and sx is unlikely,  recommend no change for now. RTC 09/2015 as schedule

## 2015-08-02 NOTE — Assessment & Plan Note (Signed)
Dizziness: s/p admission w/ dizziness felt d/t  labyrinthitis, still slightly symptomatic, recommend to call if symptoms increase or change, refer to ENT as suggested by neurology, if she decided to take prednisone suggest a lower dose. See rx DM: Close CBG monitoring if she decides to take prednisone HTN: Well-controlled, recent BMP normal. She wonders if we should stop losartan HCT as dizziness is listed as a s/e but a relationship between losartan and sx is unlikely,  recommend no change for now. RTC 09/2015 as schedule

## 2015-08-02 NOTE — Telephone Encounter (Signed)
error 

## 2015-08-03 ENCOUNTER — Other Ambulatory Visit: Payer: Self-pay | Admitting: Internal Medicine

## 2015-08-03 DIAGNOSIS — H8123 Vestibular neuronitis, bilateral: Secondary | ICD-10-CM | POA: Diagnosis not present

## 2015-08-03 NOTE — Telephone Encounter (Signed)
Okay #30 and 5 refills 

## 2015-08-03 NOTE — Telephone Encounter (Signed)
Pt is requesting refill on Alprazolam.  Last OV: 08/02/2015 Last Fill: 01/18/2015 #30 and 5RF Pt sig: 1 tablet tid PRN UDS: 06/04/2014 Low risk   Please advise.

## 2015-08-04 NOTE — Telephone Encounter (Signed)
Rx faxed to [facility]  

## 2015-08-04 NOTE — Telephone Encounter (Signed)
Rx printed, awaiting MD signature.  

## 2015-08-16 ENCOUNTER — Encounter: Payer: Medicare Other | Admitting: Gynecology

## 2015-08-18 ENCOUNTER — Encounter: Payer: Medicare Other | Admitting: Gynecology

## 2015-08-26 ENCOUNTER — Encounter: Payer: Self-pay | Admitting: Neurology

## 2015-08-26 ENCOUNTER — Ambulatory Visit (INDEPENDENT_AMBULATORY_CARE_PROVIDER_SITE_OTHER): Payer: Medicare Other | Admitting: Neurology

## 2015-08-26 VITALS — BP 124/86 | HR 76 | Ht 64.0 in | Wt 151.0 lb

## 2015-08-26 DIAGNOSIS — E119 Type 2 diabetes mellitus without complications: Secondary | ICD-10-CM | POA: Diagnosis not present

## 2015-08-26 DIAGNOSIS — Z8673 Personal history of transient ischemic attack (TIA), and cerebral infarction without residual deficits: Secondary | ICD-10-CM | POA: Diagnosis not present

## 2015-08-26 DIAGNOSIS — H8120 Vestibular neuronitis, unspecified ear: Secondary | ICD-10-CM | POA: Diagnosis not present

## 2015-08-26 DIAGNOSIS — I10 Essential (primary) hypertension: Secondary | ICD-10-CM | POA: Diagnosis not present

## 2015-08-26 MED ORDER — ATORVASTATIN CALCIUM 10 MG PO TABS: 10.0000 mg | ORAL_TABLET | Freq: Every day | ORAL | Status: DC

## 2015-08-26 NOTE — Patient Instructions (Signed)
1.  Continue the aspirin 81mg  daily 2.  I would like to start you on a cholesterol medication because your cholesterol should be lower.  We will start on a very low dose, Lipitor 10mg  daily.  We will recheck blood work in 3 months. 3.  Follow up in 6 months but call sooner with concerns.

## 2015-08-26 NOTE — Progress Notes (Signed)
NEUROLOGY FOLLOW UP OFFICE NOTE  BROOKLYNE CORTER [id number]  HISTORY OF PRESENT ILLNESS: Patricia Moore is a 82 year old right-handed female with hypertension, type 2 diabetes mellitus, hyperlipidemia, DJD and arthritis who follows up for dizziness.  UPDATE: She was started on ASA 81mg  daily.  LDL was 99, so she was advised to start Lipitor 20mg  but didn't due to concerns of leg pain.  She had an echocardiogram in March, which was unremarkable.  She has been doing until 07/30/15.  She presented to [facility] on 07/30/15 for another episode of persistent vertigo, nausea and vomiting.  CT of head showed no acute findings.  MRI of brain was normal.  She was given low-dose steroids for possible labyrnthitis and hydration.  She followed up with her ENT who concurred that she had an inner ear dysfunction.  She denied hearing loss and her hearing was fine.  She was prescribed Valium and meclizine.  She is feeling better.  Neck pain has also resolved, so she has stopped the muscle relaxant.Marland Kitchen    HISTORY: Since June 2016, she has been experiencing severe right-sided posterior neck pain that radiates down to the right shoulder.  It does not radiate into the arm.  She denies numbness or tingling of the right upper extremity or back of the head.  It is painful with neck movement.  Applying pressure is helpful.  Pain is worse later in the day.  Over the past couple of months, she had two spells of dizziness.  She woke up from sleep and noted double vision and severe spinning lasting a couple of minutes.  She just closed her eyes and was still until it resolved.  She denied slurred speech, focal numbness or weakness or headache.  She woke up on her back but reports that she turns side to side when she sleeps.  It happened one other time.  Since then, she still feels unsteady when she walks.  She has mild dizziness when she bends over.  She denies prior history of similar spells.  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 To evaluate neck pain, X-ray of cervical spine performed on 02/15/15 showed moderate multilevel degenerative disc and facet joint disease with straightening of the cervical lordosis, indicative of muscle spasm.  MRA of head from 04/05/15 demonstrated no correlating findings to the cerebellar legion seen on prior MRI.  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  She was evaluated by neurosurgery who did not feel that the pcomm aneurysm or cavernoma required intervention.  On the morning of 05/02/15, she woke up and felt dizzy.  She also reported horizontal double vision.  Her left eye was blood-red.  She reported slight headache but no slurred speech, gait instability or focal numbness or weakness. Diplopia lasted one to two days.  She saw the eye doctor who told her she did have "blood in the back" of her eye, perhaps having slept on it.    Hgb A1c was 7.5.  CT head and CTA of head and neck from 05/14/15 revealed stable small 2-3 mm right Pcomm.  She was referred to endovascular surgery for evaluation.  She was evaluated by neurosurgery who did not feel that the pcomm aneurysm or cavernoma required intervention.  PAST MEDICAL HISTORY: Past Medical History  Diagnosis Date  . Allergy   . Hypertension   . Osteoporosis   . Diabetes mellitus   . Back pain   .  Arthritis   . Hx of adenomatous colonic polyps   . Diverticulosis   . Hyperlipidemia   . H. pylori infection 2001  . Intraductal papilloma     bx nef x 2 in the 70s-90s  . SCC (squamous cell carcinoma) 03/2011    sees dermatology  . Diverticulitis     patient states has had three attacks   . Anxiety 12/09/2013  . DJD (degenerative joint disease) 12/14/2014    MEDICATIONS: Current Outpatient Prescriptions on File Prior to Visit  Medication Sig Dispense Refill  . ALPRAZolam (XANAX) 0.25 MG tablet Take 1 tablet (0.25 mg total) by mouth 3 (three) times daily as needed. 30 tablet 5  . aspirin 81  MG tablet Take 81 mg by mouth daily.    . cholecalciferol (VITAMIN D) 1000 UNITS tablet Take 1,000 Units by mouth daily.    Marland Kitchen glyBURIDE (DIABETA) 2.5 MG tablet Take 1 tablet (2.5 mg total) by mouth 2 (two) times daily with a meal. 60 tablet 6  . losartan-hydrochlorothiazide (HYZAAR) 100-12.5 MG tablet Take 1 tablet by mouth daily. 30 tablet 3  . metFORMIN (GLUCOPHAGE) 1000 MG tablet Take by mouth. 1 TABLET WITH BREAKFAST, 1/2 WITH LUNCH, 1/2 WITH DINNER    . traMADol (ULTRAM) 50 MG tablet Take 1 tablet (50 mg total) by mouth every 12 (twelve) hours as needed. 30 tablet 0   No current facility-administered medications on file prior to visit.    ALLERGIES: No Known Allergies  FAMILY HISTORY: Family History  Problem Relation Age of Onset  . Adopted: Yes  . Hypertension Neg Hx   . Diabetes Neg Hx   . Heart disease Mother 29  . Heart disease Father 23  . Breast cancer Maternal Aunt   . Stroke Maternal Aunt   . Colon cancer Paternal Uncle     7 uncles  . Colon cancer Maternal Grandfather     COLON,, FAMILY HX 7 MEMBERS  . Breast cancer Maternal Aunt   . Cancer Sister     skin    SOCIAL HISTORY: Social History   Social History  . Marital Status: Married    Spouse Name: N/A  . Number of Children: 1  . Years of Education: N/A   Occupational History  . retired     Social History Main Topics  . Smoking status: Never Smoker   . Smokeless tobacco: Never Used  . Alcohol Use: 0.6 oz/week    1 Glasses of wine per week     Comment: OCCASIONALLY  . Drug Use: No  . Sexual Activity: No   Other Topics Concern  . Not on file   Social History Narrative   Born in [place], ,moved to the [place] in the 60s, live in [place] then in [place]   Lives w/ husband   H.S. graduate    REVIEW OF SYSTEMS: Constitutional: No fevers, chills, or sweats, no generalized fatigue, change in appetite Eyes: No visual changes, double vision, eye pain Ear, nose and throat: No hearing loss, ear pain, nasal  congestion, sore throat Cardiovascular: No chest pain, palpitations Respiratory:  No shortness of breath at rest or with exertion, wheezes GastrointestinaI: No nausea, vomiting, diarrhea, abdominal pain, fecal incontinence Genitourinary:  No dysuria, urinary retention or frequency Musculoskeletal:  No neck pain, back pain Integumentary: No rash, pruritus, skin lesions Neurological: as above Psychiatric: No depression, insomnia, anxiety Endocrine: No palpitations, fatigue, diaphoresis, mood swings, change in appetite, change in weight, increased thirst Hematologic/Lymphatic:  No anemia, purpura, petechiae. Allergic/Immunologic:  no itchy/runny eyes, nasal congestion, recent allergic reactions, rashes  PHYSICAL EXAM: Filed Vitals:   08/26/15 0741  BP: 124/86  Pulse: 76   General: No acute distress.  Patient appears well-groomed.  normal body habitus. Head:  Normocephalic/atraumatic Eyes:  Fundi examined but not visualized Neck: supple, no paraspinal tenderness, full range of motion Heart:  Regular rate and rhythm Lungs:  Clear to auscultation bilaterally Back: No paraspinal tenderness Neurological Exam: alert and oriented to person, place, and time. Attention span and concentration intact, recent and remote memory intact, fund of knowledge intact.  Speech fluent and not dysarthric, language intact.  CN II-XII intact. Bulk and tone normal, muscle strength 5/5 throughout.  Sensation to light touch, temperature and vibration intact.  Deep tendon reflexes 2+ throughout, toes downgoing.  Finger to nose and heel to shin testing intact.  Gait normal, Romberg negative.  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  Given concerns for myalgia, we will start a low dose Lipitor at 10mg  daily and see how she does.  We will repeat fasting lipid panel in 3 months. Continue blood pressure and  glycemic control Follow up in 6 months or as needed.  15 minutes spent face to face with patient, over 50% spent discussing discussing diagnosis and management.  Metta Clines, DO  CC:  Kathlene November, MD

## 2015-09-01 DIAGNOSIS — D1801 Hemangioma of skin and subcutaneous tissue: Secondary | ICD-10-CM | POA: Diagnosis not present

## 2015-09-01 DIAGNOSIS — L738 Other specified follicular disorders: Secondary | ICD-10-CM | POA: Diagnosis not present

## 2015-09-01 DIAGNOSIS — Z85828 Personal history of other malignant neoplasm of skin: Secondary | ICD-10-CM | POA: Diagnosis not present

## 2015-09-01 DIAGNOSIS — L821 Other seborrheic keratosis: Secondary | ICD-10-CM | POA: Diagnosis not present

## 2015-09-01 DIAGNOSIS — D2272 Melanocytic nevi of left lower limb, including hip: Secondary | ICD-10-CM | POA: Diagnosis not present

## 2015-09-02 ENCOUNTER — Encounter: Payer: Self-pay | Admitting: Gynecology

## 2015-09-02 ENCOUNTER — Ambulatory Visit (INDEPENDENT_AMBULATORY_CARE_PROVIDER_SITE_OTHER): Payer: Medicare Other | Admitting: Gynecology

## 2015-09-02 VITALS — BP 138/86 | Ht 62.0 in | Wt 144.0 lb

## 2015-09-02 DIAGNOSIS — Z8639 Personal history of other endocrine, nutritional and metabolic disease: Secondary | ICD-10-CM

## 2015-09-02 DIAGNOSIS — E559 Vitamin D deficiency, unspecified: Secondary | ICD-10-CM | POA: Diagnosis not present

## 2015-09-02 DIAGNOSIS — M8580 Other specified disorders of bone density and structure, unspecified site: Secondary | ICD-10-CM

## 2015-09-02 DIAGNOSIS — D251 Intramural leiomyoma of uterus: Secondary | ICD-10-CM

## 2015-09-02 DIAGNOSIS — Z01419 Encounter for gynecological examination (general) (routine) without abnormal findings: Secondary | ICD-10-CM

## 2015-09-02 DIAGNOSIS — M810 Age-related osteoporosis without current pathological fracture: Secondary | ICD-10-CM | POA: Diagnosis not present

## 2015-09-02 NOTE — Progress Notes (Signed)
Patricia Moore [birth year] [id number]   History:    82 y.o.  for annual gyn exam with no GYN complaints today. Patient had been followed by Dr. Shon Millet neurologist As a result of a persistent vertigo the patient was experiencing after having gone to the [facility] emergency room in April 2017 her CT of the head showed no acute findings and the MRI of the brain was normal. 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 To evaluate neck pain, X-ray of cervical spine performed on 02/15/15 showed moderate multilevel degenerative disc and facet joint disease with straightening of the cervical lordosis, indicative of muscle spasm.  MRA of head from 04/05/15 demonstrated no correlating findings to the cerebellar legion seen on prior MRI. 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 She was evaluated by neurosurgery who did not feel that the pcomm aneurysm or cavernoma required intervention.  For vertigo she has been followed by Dr. Ezzard Standing ENT. In her PCP is Dr. Phylis Bougie is been doing her blood work.  Patient with past history of small fibroid uterus whereby an ultrasound in 2017 demonstrated following: Uterus measured 9.6 x 8.9 x 5.1 cm with an endometrial stripe of 2.8 mm. Patient with 4 calcified fibroids the largest one measuring 42 x 36 mm. Right and left ovary were atrophic. No significant change from previous ultrasound one year ago. Patient has had history of vaginal atrophy not sexually active currently not using estrogen replacement therapy. She has had history vitamin D deficiency in the past which we'll check her vitamin D level today. Patient was strong family history of colon cancer and had colon polyp removed in 2015. Patient with no past history of abnormal Pap smears. Patient's immunizations are up-to-date.  Patient's last bone density study was in 2015 demonstrated the lowest T score of -1.1 at the AP spine. No significant change from previous scan 2  years prior. She is currently on a drug-free holiday since she had been on Actonel for several years in the past.    Past medical history,surgical history, family history and social history were all reviewed and documented in the EPIC chart.  Gynecologic History Patient's last menstrual period was 04/17/1988. Contraception: post menopausal status Last Pap: 2012. Results were: normal Last mammogram: 2017. Results were: normal  Obstetric History OB History  Gravida Para Term Preterm AB SAB TAB Ectopic Multiple Living  2 1   1 1    1     # Outcome Date GA Lbr Len/2nd Weight Sex Delivery Anes PTL Lv  2 SAB           1 Para                ROS: A ROS was performed and pertinent positives and negatives are included in the history.  GENERAL: No fevers or chills. HEENT: No change in vision, no earache, sore throat or sinus congestion. NECK: No pain or stiffness. CARDIOVASCULAR: No chest pain or pressure. No palpitations. PULMONARY: No shortness of breath, cough or wheeze. GASTROINTESTINAL: No abdominal pain, nausea, vomiting or diarrhea, melena or bright red blood per rectum. GENITOURINARY: No urinary frequency, urgency, hesitancy or dysuria. MUSCULOSKELETAL: No joint or muscle pain, no back pain, no recent trauma. DERMATOLOGIC: No rash, no itching, no lesions. ENDOCRINE: No polyuria, polydipsia, no heat or cold intolerance. No recent change in weight. HEMATOLOGICAL: No anemia or easy bruising or bleeding. NEUROLOGIC: No headache, seizures, numbness, tingling or weakness. PSYCHIATRIC:  No depression, no loss of interest in normal activity or change in sleep pattern.     Exam: chaperone present  BP 138/86 mmHg  Ht 5\' 2"  (1.575 m)  Wt 144 lb (65.318 kg)  BMI 26.33 kg/m2  LMP 04/17/1988  Body mass index is 26.33 kg/(m^2).  General appearance : Well developed well nourished female. No acute distress HEENT: Eyes: no retinal hemorrhage or exudates,  Neck supple, trachea midline, no carotid  bruits, no thyroidmegaly Lungs: Clear to auscultation, no rhonchi or wheezes, or rib retractions  Heart: Regular rate and rhythm, no murmurs or gallops Breast:Examined in sitting and supine position were symmetrical in appearance, no palpable masses or tenderness,  no skin retraction, no nipple inversion, no nipple discharge, no skin discoloration, no axillary or supraclavicular lymphadenopathy Abdomen: no palpable masses or tenderness, no rebound or guarding Extremities: no edema or skin discoloration or tenderness  Pelvic:  Bartholin, Urethra, Skene Glands: Within normal limits             Vagina: No gross lesions or discharge, atrophic changes  Cervix: No gross lesions or discharge  Uterus  axial, normal size, shape and consistency, non-tender and mobile  Adnexa  Without masses or tenderness  Anus and perineum  normal   Rectovaginal  normal sphincter tone without palpated masses or tenderness             Hemoccult PCP provides     Assessment/Plan:  82 y.o. female for annual exam with history of vitamin D deficiency will check a vitamin D level today. Her PCP is been doing the remainder her blood work. She was given a requisition for her bone density study. We discussed importance of calcium vitamin D and weightbearing exercise for osteoporosis prevention. We discussed importance of monthly breast exam. Pap smear no longer indicated according to the new guidelines.   Ok Edwards MD, 10:07 AM 09/02/2015

## 2015-09-03 LAB — VITAMIN D 25 HYDROXY (VIT D DEFICIENCY, FRACTURES): Vit D, 25-Hydroxy: 43 ng/mL (ref 30–100)

## 2015-09-08 ENCOUNTER — Telehealth: Payer: Self-pay

## 2015-09-08 DIAGNOSIS — Z8673 Personal history of transient ischemic attack (TIA), and cerebral infarction without residual deficits: Secondary | ICD-10-CM

## 2015-09-08 NOTE — Telephone Encounter (Signed)
-----   Message from Amada Kingfisher, [place] sent at 06/11/2015  3:31 PM EST ----- Lipid

## 2015-09-08 NOTE — Telephone Encounter (Signed)
Time for pt to have fasting lipid lab done. Called pt to schedule, line disconnected w/o a machine or person answering. WIll call back later.

## 2015-09-12 ENCOUNTER — Other Ambulatory Visit: Payer: Self-pay | Admitting: Internal Medicine

## 2015-09-16 ENCOUNTER — Ambulatory Visit (INDEPENDENT_AMBULATORY_CARE_PROVIDER_SITE_OTHER): Payer: Medicare Other | Admitting: Internal Medicine

## 2015-09-16 ENCOUNTER — Other Ambulatory Visit: Payer: Self-pay

## 2015-09-16 ENCOUNTER — Encounter: Payer: Self-pay | Admitting: Internal Medicine

## 2015-09-16 VITALS — BP 122/56 | HR 71 | Temp 98.0°F | Ht 62.0 in | Wt 149.1 lb

## 2015-09-16 DIAGNOSIS — E118 Type 2 diabetes mellitus with unspecified complications: Secondary | ICD-10-CM | POA: Diagnosis not present

## 2015-09-16 DIAGNOSIS — Z79899 Other long term (current) drug therapy: Secondary | ICD-10-CM | POA: Diagnosis not present

## 2015-09-16 DIAGNOSIS — G459 Transient cerebral ischemic attack, unspecified: Secondary | ICD-10-CM

## 2015-09-16 DIAGNOSIS — E785 Hyperlipidemia, unspecified: Secondary | ICD-10-CM | POA: Diagnosis not present

## 2015-09-16 LAB — ALT: ALT: 19 U/L (ref 0–35)

## 2015-09-16 LAB — HEMOGLOBIN A1C: Hgb A1c MFr Bld: 6.9 % — ABNORMAL HIGH (ref 4.6–6.5)

## 2015-09-16 LAB — AST: AST: 25 U/L (ref 0–37)

## 2015-09-16 MED ORDER — LOSARTAN POTASSIUM-HCTZ 100-12.5 MG PO TABS: 1.0000 | ORAL_TABLET | Freq: Every day | ORAL | Status: DC

## 2015-09-16 NOTE — Progress Notes (Signed)
Subjective:    Patient ID: Patricia Moore, female    DOB: [birth year], 82 y.o.   MRN: [id number]  DOS:  09/16/2015 Type of visit - description : ROV Interval history: DM: Good medication compliance, ambulatory blood sugars in the 120s Dizziness: Better HTN: Ambulatory BPs range from 109, 120s with diastolic in the 70s. Needs refills.    Review of Systems  Denies chest pain or difficulty breathing Has lower extremity edema, somewhat concerned about it but symptoms are well controlled with leg elevation, low-salt diet and compression stockings.  Past Medical History  Diagnosis Date  . Allergy   . Hypertension   . Osteoporosis   . Diabetes mellitus   . Back pain   . Arthritis   . Hx of adenomatous colonic polyps   . Diverticulosis   . Hyperlipidemia   . H. pylori infection 2001  . Intraductal papilloma     bx nef x 2 in the 70s-90s  . SCC (squamous cell carcinoma) 03/2011    sees dermatology  . Diverticulitis     patient states has had three attacks   . Anxiety 12/09/2013  . DJD (degenerative joint disease) 12/14/2014  . Saccular aneurysm     Past Surgical History  Procedure Laterality Date  . Breast biopsy  '91, '95    INTRADUCTAL PAPILLOMA  . Breast lumpectomy    . Esophagogastroduodenoscopy    . Colonoscopy         . Cataract extraction Left 09-2014    Social History   Social History  . Marital Status: Married    Spouse Name: N/A  . Number of Children: 1  . Years of Education: N/A   Occupational History  . retired     Social History Main Topics  . Smoking status: Never Smoker   . Smokeless tobacco: Never Used  . Alcohol Use: 0.6 oz/week    1 Glasses of wine per week     Comment: OCCASIONALLY  . Drug Use: No  . Sexual Activity: No   Other Topics Concern  . Not on file   Social History Narrative   Born in [place], ,moved to the [place] in the 60s, live in [place] then in [place]   Lives w/ husband   H.S. graduate        Medication List       This list is  accurate as of: 09/16/15  1:04 PM.  Always use your most recent med list.               ALPRAZolam 0.25 MG tablet  Commonly known as:  XANAX  Take 1 tablet (0.25 mg total) by mouth 3 (three) times daily as needed.     aspirin 81 MG tablet  Take 81 mg by mouth daily.     atorvastatin 10 MG tablet  Commonly known as:  LIPITOR  Take 1 tablet (10 mg total) by mouth daily.     cholecalciferol 1000 units tablet  Commonly known as:  VITAMIN D  Take 1,000 Units by mouth daily.     glyBURIDE 2.5 MG tablet  Commonly known as:  DIABETA  Take 1 tablet (2.5 mg total) by mouth 2 (two) times daily with a meal.     losartan-hydrochlorothiazide 100-12.5 MG tablet  Commonly known as:  HYZAAR  Take 1 tablet by mouth daily.     meclizine 25 MG tablet  Commonly known as:  ANTIVERT  Take 25 mg by mouth 3 (three) times daily as needed  for dizziness.     metFORMIN 1000 MG tablet  Commonly known as:  GLUCOPHAGE  Take by mouth. 1 TABLET WITH BREAKFAST, 1/2 WITH LUNCH, 1/2 WITH DINNER     ONE TOUCH ULTRA TEST test strip  Generic drug:  glucose blood  Reported on 09/16/2015     onetouch ultrasoft lancets  Reported on 09/16/2015     traMADol 50 MG tablet  Commonly known as:  ULTRAM  Take 1 tablet (50 mg total) by mouth every 12 (twelve) hours as needed.           Objective:   Physical Exam BP 122/56 mmHg  Pulse 71  Temp(Src) 98 F (36.7 C) (Oral)  Ht 5\' 2"  (1.575 m)  Wt 149 lb 2 oz (67.643 kg)  BMI 27.27 kg/m2  SpO2 96%  LMP 04/17/1988 General:   Well developed, well nourished . NAD.  HEENT:  Normocephalic . Face symmetric, atraumatic Lungs:  CTA B Normal respiratory effort, no intercostal retractions, no accessory muscle use. Heart: RRR,  no murmur.  No pretibial edema bilaterally  Skin: Not pale. Not jaundice Neurologic:  alert & oriented X3.  Speech normal, gait appropriate for age and unassisted Psych--  Cognition and judgment appear intact.  Cooperative with normal  attention span and concentration.  Behavior appropriate. No anxious or depressed appearing.      Assessment & Plan:   Assessment > DM HTN Hyperlipidemia Anxiety GI: --Recurrent diverticulitis (one episode documented by a CT 11-2014) --Last colonoscopy 10-2013, Dr. Marina Goodell, + polyps, melanosis coli, tics --H. pylori 2001 DJD--back pain, knees Osteoporosis SCC, skin cancer NEURO --Carotid US 10-2013: No significant plaque, although ICA's are serpentine, bilaterally. 40-59% bilateral ICA stenosis by velocity criteria likely due to tortuosity. Patent vertebral arteries with antegrade flow.Normal subclavian arteries, bilaterally. -- Transinet dizziness, diplopia sx late 2016 , saw neuro >>> Dx w/ TIA/Stroke on clinical grounds  W/u:  Brain  MRI 03-2015 >> 2 mm saccular aneurysm at the right posterior communicating artery origin, saw  Neurology, Rx to recheck MRI 1 year, ok continue ASA per neuro CTA head, neck 05-14-2015: aneurysm confirmed, otherwise (-) --Admitted 07-2015, dizziness, MRI negative, DX labyrinthitis    PLAN: DM: Continue metformin and glyburide, check A1c HTN: Refill losartan HCT, recent BMP satisfactory Hyperlipidemia: Few weeks ago started Lipitor, so far no s/e, check LFTs. Neurology plans to check a FLP in 3 months Dizziness: Resolved TIA: Requests homocysteine levels checked. Will do. RTC 3-4 months, CPX

## 2015-09-16 NOTE — Assessment & Plan Note (Signed)
DM: Continue metformin and glyburide, check A1c HTN: Refill losartan HCT, recent BMP satisfactory Hyperlipidemia: Few weeks ago started Lipitor, so far no s/e, check LFTs. Neurology plans to check a FLP in 3 months Dizziness: Resolved TIA: Requests homocysteine levels checked. Will do. RTC 3-4 months, CPX

## 2015-09-16 NOTE — Progress Notes (Signed)
Pre visit review using our clinic review tool, if applicable. No additional management support is needed unless otherwise documented below in the visit note. 

## 2015-09-16 NOTE — Patient Instructions (Signed)
GO TO THE LAB : Get the blood work     GO TO THE FRONT DESK Schedule your next appointment for a  Physical exam in 3-4 months, fasting  

## 2015-09-17 LAB — HOMOCYSTEINE: Homocysteine: 15.2 umol/L — ABNORMAL HIGH (ref <10.4)

## 2015-09-20 MED ORDER — FOLIC ACID 1 MG PO TABS: 1.0000 mg | ORAL_TABLET | Freq: Every day | ORAL | Status: DC

## 2015-09-20 NOTE — Addendum Note (Signed)
Addended byDamita Dunnings D on: 09/20/2015 10:25 AM   Modules accepted: Orders

## 2015-09-21 DIAGNOSIS — H401121 Primary open-angle glaucoma, left eye, mild stage: Secondary | ICD-10-CM | POA: Diagnosis not present

## 2015-09-21 DIAGNOSIS — H25811 Combined forms of age-related cataract, right eye: Secondary | ICD-10-CM | POA: Diagnosis not present

## 2015-09-21 DIAGNOSIS — H4089 Other specified glaucoma: Secondary | ICD-10-CM | POA: Diagnosis not present

## 2015-10-15 ENCOUNTER — Ambulatory Visit: Payer: Medicare Other | Admitting: Neurology

## 2015-11-03 DIAGNOSIS — H401121 Primary open-angle glaucoma, left eye, mild stage: Secondary | ICD-10-CM | POA: Diagnosis not present

## 2015-11-03 DIAGNOSIS — H5310 Unspecified subjective visual disturbances: Secondary | ICD-10-CM | POA: Diagnosis not present

## 2015-11-03 DIAGNOSIS — H25811 Combined forms of age-related cataract, right eye: Secondary | ICD-10-CM | POA: Diagnosis not present

## 2015-11-03 DIAGNOSIS — H4089 Other specified glaucoma: Secondary | ICD-10-CM | POA: Diagnosis not present

## 2015-11-26 ENCOUNTER — Telehealth: Payer: Self-pay

## 2015-11-26 NOTE — Telephone Encounter (Signed)
Called patient to schedule a Lab only visit for fasting lipid panel.  Home number rang and rang. Was able to leave message on cell number.

## 2015-11-26 NOTE — Telephone Encounter (Signed)
-----   Message from Amada Kingfisher, [place] sent at 08/26/2015  7:54 AM EDT ----- Fasting lipid. No order yet placed

## 2015-11-29 ENCOUNTER — Other Ambulatory Visit: Payer: Self-pay | Admitting: Neurology

## 2015-11-29 NOTE — Telephone Encounter (Signed)
Spoke to patient will come by this week for labs.

## 2015-11-30 ENCOUNTER — Ambulatory Visit: Payer: Medicare Other | Admitting: Internal Medicine

## 2015-12-01 ENCOUNTER — Encounter: Payer: Self-pay | Admitting: Internal Medicine

## 2015-12-01 ENCOUNTER — Ambulatory Visit (INDEPENDENT_AMBULATORY_CARE_PROVIDER_SITE_OTHER): Payer: Medicare Other | Admitting: Internal Medicine

## 2015-12-01 VITALS — BP 126/60 | HR 66 | Temp 97.6°F | Resp 14 | Ht 62.0 in | Wt 149.4 lb

## 2015-12-01 DIAGNOSIS — M150 Primary generalized (osteo)arthritis: Secondary | ICD-10-CM | POA: Diagnosis not present

## 2015-12-01 DIAGNOSIS — F419 Anxiety disorder, unspecified: Secondary | ICD-10-CM | POA: Diagnosis not present

## 2015-12-01 DIAGNOSIS — M159 Polyosteoarthritis, unspecified: Secondary | ICD-10-CM

## 2015-12-01 DIAGNOSIS — L309 Dermatitis, unspecified: Secondary | ICD-10-CM

## 2015-12-01 DIAGNOSIS — Z79899 Other long term (current) drug therapy: Secondary | ICD-10-CM | POA: Diagnosis not present

## 2015-12-01 DIAGNOSIS — M8949 Other hypertrophic osteoarthropathy, multiple sites: Secondary | ICD-10-CM

## 2015-12-01 MED ORDER — HYDROCORTISONE 2.5 % EX CREA: TOPICAL_CREAM | Freq: Two times a day (BID) | CUTANEOUS | 0 refills | Status: DC

## 2015-12-01 NOTE — Patient Instructions (Signed)
Go to the lab and provide a urine sample for a UDS

## 2015-12-01 NOTE — Progress Notes (Signed)
Subjective:    Patient ID: Patricia Moore, female    DOB: [birth year], 82 y.o.   MRN: [id number]  DOS:  12/01/2015 Type of visit - description : Acute visit Interval history:  A week ago, works at her yard, after that developed a itchy area on the right neck. Using calamine without much help. Anxiety: Controlled with Xanax. Needs a UDS Pain management: On Ultram, needs a UDS  Review of Systems Denies fever, chills. No itching, no rash on the hands or wrists.   Past Medical History:  Diagnosis Date  . Allergy   . Anxiety 12/09/2013  . Arthritis   . Back pain   . Diabetes mellitus   . Diverticulitis    patient states has had three attacks   . Diverticulosis   . DJD (degenerative joint disease) 12/14/2014  . H. pylori infection 2001  . Hx of adenomatous colonic polyps   . Hyperlipidemia   . Hypertension   . Intraductal papilloma    bx nef x 2 in the 70s-90s  . Osteoporosis   . Saccular aneurysm   . SCC (squamous cell carcinoma) 03/2011   sees dermatology    Past Surgical History:  Procedure Laterality Date  . BREAST BIOPSY  '91, '95   INTRADUCTAL PAPILLOMA  . BREAST LUMPECTOMY    . CATARACT EXTRACTION Left 09-2014    Social History   Social History  . Marital status: Married    Spouse name: N/A  . Number of children: 1  . Years of education: N/A   Occupational History  . retired  [employer] trader   Social History Main Topics  . Smoking status: Never Smoker  . Smokeless tobacco: Never Used  . Alcohol use 0.6 oz/week    1 Glasses of wine per week     Comment: OCCASIONALLY  . Drug use: No  . Sexual activity: No   Other Topics Concern  . Not on file   Social History Narrative   Born in [place], ,moved to the [place] in the 60s, live in [place] then in [place]   Lives w/ husband   H.S. graduate        Medication List       Accurate as of 12/01/15 11:59 PM. Always use your most recent med list.          ALPRAZolam 0.25 MG tablet Commonly known as:  XANAX Take 1  tablet (0.25 mg total) by mouth 3 (three) times daily as needed.   aspirin 81 MG tablet Take 81 mg by mouth daily.   atorvastatin 10 MG tablet Commonly known as:  LIPITOR Take 1 tablet (10 mg total) by mouth daily.   cholecalciferol 1000 units tablet Commonly known as:  VITAMIN D Take 1,000 Units by mouth daily.   folic acid 1 MG tablet Commonly known as:  FOLVITE Take 1 tablet (1 mg total) by mouth daily.   glyBURIDE 2.5 MG tablet Commonly known as:  DIABETA Take 1 tablet (2.5 mg total) by mouth 2 (two) times daily with a meal.   hydrocortisone 2.5 % cream Apply topically 2 (two) times daily.   losartan-hydrochlorothiazide 100-12.5 MG tablet Commonly known as:  HYZAAR Take 1 tablet by mouth daily.   meclizine 25 MG tablet Commonly known as:  ANTIVERT Take 25 mg by mouth 3 (three) times daily as needed for dizziness.   metFORMIN 1000 MG tablet Commonly known as:  GLUCOPHAGE Take by mouth. 1 TABLET WITH BREAKFAST, 1/2 WITH LUNCH, 1/2 WITH DINNER  ONE TOUCH ULTRA TEST test strip Generic drug:  glucose blood Reported on 09/16/2015   onetouch ultrasoft lancets Reported on 09/16/2015   traMADol 50 MG tablet Commonly known as:  ULTRAM Take 1 tablet (50 mg total) by mouth every 12 (twelve) hours as needed.          Objective:   Physical Exam  Neck:     BP 126/60 (BP Location: Left Arm, Patient Position: Sitting, Cuff Size: Small)   Pulse 66   Temp 97.6 F (36.4 C) (Oral)   Resp 14   Ht 5\' 2"  (1.575 m)   Wt 149 lb 6 oz (67.8 kg)   LMP 04/17/1988   SpO2 97%   BMI 27.32 kg/m  General:   Well developed, well nourished . NAD.  HEENT:  Normocephalic . Face symmetric, atraumatic Skin: Not pale. Not jaundice Neurologic:  alert & oriented X3.  Speech normal, gait appropriate for age and unassisted Psych--  Cognition and judgment appear intact.  Cooperative with normal attention span and concentration.  Behavior appropriate. No anxious or depressed  appearing.      Assessment & Plan:   Assessment > DM HTN Hyperlipidemia Anxiety GI: --Recurrent diverticulitis (one episode documented by a CT 11-2014) --Last colonoscopy 10-2013, Dr. Marina Goodell, + polyps, melanosis coli, tics --H. pylori 2001 DJD--back pain, knees Osteoporosis SCC, skin cancer NEURO --Carotid US 10-2013: No significant plaque, although ICA's are serpentine, bilaterally. 40-59% bilateral ICA stenosis by velocity criteria likely due to tortuosity. Patent vertebral arteries with antegrade flow.Normal subclavian arteries, bilaterally. -- Transinet dizziness, diplopia sx late 2016 , saw neuro >>> Dx w/ TIA/Stroke on clinical grounds  W/u:  Brain  MRI 03-2015 >> 2 mm saccular aneurysm at the right posterior communicating artery origin, saw  Neurology, Rx to recheck MRI 1 year, ok continue ASA per neuro CTA head, neck 05-14-2015: aneurysm confirmed, otherwise (-) --Admitted 07-2015, dizziness, MRI negative, DX labyrinthitis    PLAN:  Dermatitis: After she worked in the yard, no other skin issues, contact dermatitis? Rx topical steroids. Call if no better Controlled substance management: on xanax for anxiety, Ultram for pain. Contract and a UDS today RTC as a schedule 01-2016 CPX

## 2015-12-01 NOTE — Progress Notes (Signed)
Pre visit review using our clinic review tool, if applicable. No additional management support is needed unless otherwise documented below in the visit note. 

## 2015-12-02 NOTE — Assessment & Plan Note (Signed)
Dermatitis: After she worked in the yard, no other skin issues, contact dermatitis? Rx topical steroids. Call if no better Controlled substance management: on xanax for anxiety, Ultram for pain. Contract and a UDS today RTC as a schedule 01-2016 CPX

## 2015-12-06 ENCOUNTER — Other Ambulatory Visit (INDEPENDENT_AMBULATORY_CARE_PROVIDER_SITE_OTHER): Payer: Medicare Other

## 2015-12-06 DIAGNOSIS — Z8673 Personal history of transient ischemic attack (TIA), and cerebral infarction without residual deficits: Secondary | ICD-10-CM | POA: Diagnosis not present

## 2015-12-06 LAB — LIPID PANEL
Cholesterol: 153 mg/dL (ref 0–200)
HDL: 56.4 mg/dL (ref >39.00)
LDL Cholesterol: 76 mg/dL (ref 0–99)
NonHDL: 96.81
Total CHOL/HDL Ratio: 3
Triglycerides: 104 mg/dL (ref 0.0–149.0)
VLDL: 20.8 mg/dL (ref 0.0–40.0)

## 2015-12-07 ENCOUNTER — Telehealth: Payer: Self-pay

## 2015-12-07 NOTE — Telephone Encounter (Signed)
Previous comments noted. Please call the patient, recommend to stop Lipitor, call us in 2 weeks and let us know how she's doing. If pain went away will try a different medication perhaps Crestor 5 mg every other day or Pravachol 10 mg. If pain continue, will need further eval.

## 2015-12-07 NOTE — Telephone Encounter (Signed)
Spoke w/ Pt, informed her of recommendations to hold Lipitor for 2 weeks and call to let us know how she is doing. Pt verbalized understanding but also wanted to mention she has been having intermittent metallic taste in her mouth during the night and early in the morning for approximately 2 weeks, she didn't know if was related to any medications she is taking or her liver functions.

## 2015-12-07 NOTE — Telephone Encounter (Signed)
-----   Message from Pieter Partridge, DO sent at 12/07/2015  7:35 AM EDT ----- Cholesterol is better.  LDL is 76 but ideally I would like it less than 70.  If she is agreeable, I would like to increase Lipitor to 20mg  daily and repeat fasting lipid panel and LFTs in 3 months (prior to follow up)

## 2015-12-07 NOTE — Telephone Encounter (Signed)
Although LDL goal should be less than 70, I am okay with 76.  However, if the 10mg  Lipitor is causing side effects, then it should probably be changed to a less potent statin.  I would ask her to discuss alternatives with Dr. Larose Kells, since he regularly treats cholesterol.  I will route this message to Dr. Larose Kells as well.

## 2015-12-07 NOTE — Telephone Encounter (Signed)
Message relayed to patient. Verbalized understanding and denied questions. Will call Dr. Ethel Rana office.

## 2015-12-07 NOTE — Telephone Encounter (Signed)
Spoke with patient. She has some concerns with increasing her Lipitor. Pt states she is having some pretty severe leg cramps in her L leg. It is affecting from her thigh to her foot. Happens a few times a week. Pain so severe sometimes she cannot walk. Pt states she is also experiencing some nightly bitter taste. Please advise.

## 2015-12-07 NOTE — Telephone Encounter (Signed)
Okay, we'll see how she does without Lipitor.

## 2015-12-08 NOTE — Telephone Encounter (Signed)
Tried calling Pt, no answer, unable to leave message. Will try again later.

## 2015-12-10 ENCOUNTER — Telehealth: Payer: Self-pay

## 2015-12-10 NOTE — Telephone Encounter (Signed)
UDS: 12/03/2015  Negative for Alprazolam:PRN  Low risk per Dr. Larose Kells 12/10/2015

## 2015-12-14 DIAGNOSIS — M8589 Other specified disorders of bone density and structure, multiple sites: Secondary | ICD-10-CM | POA: Diagnosis not present

## 2015-12-14 LAB — HM DEXA SCAN

## 2015-12-21 ENCOUNTER — Encounter: Payer: Self-pay | Admitting: Anesthesiology

## 2015-12-27 ENCOUNTER — Telehealth: Payer: Self-pay | Admitting: Neurology

## 2015-12-27 MED ORDER — BACLOFEN 10 MG PO TABS: ORAL_TABLET | ORAL | 3 refills | Status: DC

## 2015-12-27 NOTE — Telephone Encounter (Signed)
Patricia Moore August 03, 2033 She is out of her medication Baclofen 10 mg. She uses [facility] on [address]. Her # is [phone]. Thank you

## 2015-12-27 NOTE — Telephone Encounter (Signed)
Wrong number before [phone] sorry

## 2015-12-29 ENCOUNTER — Encounter: Payer: Self-pay | Admitting: Internal Medicine

## 2015-12-29 ENCOUNTER — Ambulatory Visit (INDEPENDENT_AMBULATORY_CARE_PROVIDER_SITE_OTHER): Payer: Medicare Other | Admitting: Internal Medicine

## 2015-12-29 VITALS — BP 124/78 | HR 71 | Temp 97.9°F | Resp 14 | Ht 62.0 in | Wt 150.2 lb

## 2015-12-29 DIAGNOSIS — D649 Anemia, unspecified: Secondary | ICD-10-CM

## 2015-12-29 DIAGNOSIS — Z23 Encounter for immunization: Secondary | ICD-10-CM | POA: Diagnosis not present

## 2015-12-29 DIAGNOSIS — R7989 Other specified abnormal findings of blood chemistry: Secondary | ICD-10-CM

## 2015-12-29 DIAGNOSIS — E7211 Homocystinuria: Secondary | ICD-10-CM | POA: Diagnosis not present

## 2015-12-29 DIAGNOSIS — E785 Hyperlipidemia, unspecified: Secondary | ICD-10-CM | POA: Diagnosis not present

## 2015-12-29 LAB — CBC WITH DIFFERENTIAL/PLATELET
Basophils Absolute: 0.1 10*3/uL (ref 0.0–0.1)
Basophils Relative: 1 % (ref 0.0–3.0)
Eosinophils Absolute: 2.1 10*3/uL — ABNORMAL HIGH (ref 0.0–0.7)
Eosinophils Relative: 19 % — ABNORMAL HIGH (ref 0.0–5.0)
HCT: 39.8 % (ref 36.0–46.0)
Hemoglobin: 13.7 g/dL (ref 12.0–15.0)
Lymphocytes Relative: 28.7 % (ref 12.0–46.0)
Lymphs Abs: 3.1 10*3/uL (ref 0.7–4.0)
MCHC: 34.5 g/dL (ref 30.0–36.0)
MCV: 90.8 fl (ref 78.0–100.0)
Monocytes Absolute: 0.7 10*3/uL (ref 0.1–1.0)
Monocytes Relative: 6.4 % (ref 3.0–12.0)
Neutro Abs: 4.9 10*3/uL (ref 1.4–7.7)
Neutrophils Relative %: 44.9 % (ref 43.0–77.0)
Platelets: 374 10*3/uL (ref 150.0–400.0)
RBC: 4.38 Mil/uL (ref 3.87–5.11)
RDW: 12.4 % (ref 11.5–15.5)
WBC: 11 10*3/uL — ABNORMAL HIGH (ref 4.0–10.5)

## 2015-12-29 MED ORDER — ROSUVASTATIN CALCIUM 5 MG PO TABS: 5.0000 mg | ORAL_TABLET | Freq: Every day | ORAL | 5 refills | Status: DC

## 2015-12-29 MED ORDER — GLYBURIDE 2.5 MG PO TABS: 2.5000 mg | ORAL_TABLET | Freq: Two times a day (BID) | ORAL | 5 refills | Status: DC

## 2015-12-29 MED ORDER — METFORMIN HCL 1000 MG PO TABS: ORAL_TABLET | ORAL | 5 refills | Status: DC

## 2015-12-29 NOTE — Progress Notes (Signed)
Subjective:    Patient ID: Patricia Moore, female    DOB: [birth year], 82 y.o.   MRN: [id number]  DOS:  12/29/2015 Type of visit - description : f/u Interval history: Her main concern is leg pain. Started Lipitor 08/2015, since then both legs feel stiff and occasionally has cramps. Had a LDL of 76 last month increased Lipitor but she still taking only 10 mg daily. She self DC'd Lipitor 2 weeks ago, since then symptoms have decreased and they are now sporadic.   Review of Systems  Denies major problems with low back pain. Question of claudication, sometimes pain is worse when she walks  Past Medical History:  Diagnosis Date  . Allergy   . Anxiety 12/09/2013  . Arthritis   . Back pain   . Diabetes mellitus   . Diverticulitis    patient states has had three attacks   . Diverticulosis   . DJD (degenerative joint disease) 12/14/2014  . H. pylori infection 2001  . Hx of adenomatous colonic polyps   . Hyperlipidemia   . Hypertension   . Intraductal papilloma    bx nef x 2 in the 70s-90s  . Osteoporosis   . Saccular aneurysm   . SCC (squamous cell carcinoma) 03/2011   sees dermatology    Past Surgical History:  Procedure Laterality Date  . BREAST BIOPSY  '91, '95   INTRADUCTAL PAPILLOMA  . BREAST LUMPECTOMY    . CATARACT EXTRACTION Left 09-2014    Social History   Social History  . Marital status: Married    Spouse name: N/A  . Number of children: 1  . Years of education: N/A   Occupational History  . retired  Futures trader   Social History Main Topics  . Smoking status: Never Smoker  . Smokeless tobacco: Never Used  . Alcohol use 0.6 oz/week    1 Glasses of wine per week     Comment: OCCASIONALLY  . Drug use: No  . Sexual activity: No   Other Topics Concern  . Not on file   Social History Narrative   Born in [place], ,moved to the [place] in the 60s, live in [place] then in [place]   Lives w/ husband   H.S. graduate        Medication List       Accurate as of 12/29/15   4:59 PM. Always use your most recent med list.          ALPRAZolam 0.25 MG tablet Commonly known as:  XANAX Take 1 tablet (0.25 mg total) by mouth 3 (three) times daily as needed.   aspirin 81 MG tablet Take 81 mg by mouth daily.   baclofen 10 MG tablet Commonly known as:  LIORESAL Take 0.5 to 1 tablet at bedtime   cholecalciferol 1000 units tablet Commonly known as:  VITAMIN D Take 1,000 Units by mouth daily.   co-enzyme Q-10 30 MG capsule Take 30 mg by mouth daily.   folic acid 1 MG tablet Commonly known as:  FOLVITE Take 1 tablet (1 mg total) by mouth daily.   glyBURIDE 2.5 MG tablet Commonly known as:  DIABETA Take 1 tablet (2.5 mg total) by mouth 2 (two) times daily with a meal.   hydrocortisone 2.5 % cream Apply topically 2 (two) times daily.   losartan-hydrochlorothiazide 100-12.5 MG tablet Commonly known as:  HYZAAR Take 1 tablet by mouth daily.   meclizine 25 MG tablet Commonly known as:  ANTIVERT Take 25 mg by mouth  3 (three) times daily as needed for dizziness.   metFORMIN 1000 MG tablet Commonly known as:  GLUCOPHAGE Take 1 tablet by mouth with breakfast, 1/2 tablet by mouth with lunch, and 1/2 tablet by mouth with dinner   ONE TOUCH ULTRA TEST test strip Generic drug:  glucose blood Reported on 09/16/2015   onetouch ultrasoft lancets Reported on 09/16/2015   rosuvastatin 5 MG tablet Commonly known as:  CRESTOR Take 1 tablet (5 mg total) by mouth at bedtime.   traMADol 50 MG tablet Commonly known as:  ULTRAM Take 1 tablet (50 mg total) by mouth every 12 (twelve) hours as needed.          Objective:   Physical Exam BP 124/78 (BP Location: Left Arm, Patient Position: Sitting, Cuff Size: Small)   Pulse 71   Temp 97.9 F (36.6 C) (Oral)   Resp 14   Ht 5\' 2"  (1.575 m)   Wt 150 lb 4 oz (68.2 kg)   LMP 04/17/1988   SpO2 97%   BMI 27.48 kg/m  General:   Well developed, well nourished . NAD.  HEENT:  Normocephalic . Face symmetric,  atraumatic  Lower extremities: Normal pedal and femoral pulses. No TTP and the muscle mass. No edema Abdomen:  Not distended, soft, non-tender. No rebound or rigidity.  Skin: Not pale. Not jaundice Neurologic:  alert & oriented X3.  Speech normal, gait appropriate for age and unassisted Psych--  Cognition and judgment appear intact.  Cooperative with normal attention span and concentration.  Behavior appropriate. No anxious or depressed appearing.    Assessment & Plan:   Assessment > DM HTN Hyperlipidemia Anxiety GI: --Recurrent diverticulitis (one episode documented by a CT 11-2014) --Last colonoscopy 10-2013, Dr. Marina Goodell, + polyps, melanosis coli, tics --H. pylori 2001 DJD--back pain, knees Osteoporosis SCC, skin cancer Elevated homocysteine NEURO --Carotid US 10-2013: No significant plaque, although ICA's are serpentine, bilaterally. 40-59% bilateral ICA stenosis by velocity criteria likely due to tortuosity. Patent vertebral arteries with antegrade flow.Normal subclavian arteries, bilaterally. -- Transinet dizziness, diplopia sx late 2016 , saw neuro >>> Dx w/ TIA/Stroke on clinical grounds  W/u:  Brain  MRI 03-2015 >> 2 mm saccular aneurysm at the right posterior communicating artery origin, saw  Neurology, Rx to recheck MRI 1 year, ok continue ASA per neuro CTA head, neck 05-14-2015: aneurysm confirmed, otherwise (-) --Admitted 07-2015, dizziness, MRI negative, DX labyrinthitis    PLAN: Myalgias: Related to Lipitor?Marland Kitchen She already stopped it, recommend to switch to Crestor 5 mg (starting in 2 weeks). Watch for s/e. Hyperlipidemia: Rx Lipitor 08/2015, (she was recommended to increase to 20 mg but never did), developed lower extremity pain since she started the medication. Plan ----Switching to Crestor. CoQ10 Elevated homocysteine : Started folic acid 3 months ago. Check labs. Mild anemia noted. Recheck a CBC today. RTC next  month for a  CPX

## 2015-12-29 NOTE — Progress Notes (Signed)
Pre visit review using our clinic review tool, if applicable. No additional management support is needed unless otherwise documented below in the visit note. 

## 2015-12-29 NOTE — Patient Instructions (Signed)
GO TO THE LAB : Get the blood work     In two weeks, start Crestor 5 mg one tablet at bedtime  Also take CoQ10  OTC to see if it  help with aches  Next visit is next months for a physical exam

## 2015-12-29 NOTE — Assessment & Plan Note (Signed)
Myalgias: Related to Lipitor?Marland Kitchen She already stopped it, recommend to switch to Crestor 5 mg (starting in 2 weeks). Watch for s/e. Hyperlipidemia: Rx Lipitor 08/2015, (she was recommended to increase to 20 mg but never did), developed lower extremity pain since she started the medication. Plan ----Switching to Crestor. CoQ10 Elevated homocysteine : Started folic acid 3 months ago. Check labs. Mild anemia noted. Recheck a CBC today. RTC next  month for a  CPX

## 2015-12-30 ENCOUNTER — Other Ambulatory Visit: Payer: Self-pay | Admitting: Internal Medicine

## 2015-12-30 LAB — HOMOCYSTEINE: Homocysteine: 12.3 umol/L — ABNORMAL HIGH (ref <10.4)

## 2015-12-31 ENCOUNTER — Encounter: Payer: Self-pay | Admitting: Internal Medicine

## 2016-01-03 MED ORDER — FOLIC ACID 1 MG PO TABS: 2.0000 mg | ORAL_TABLET | Freq: Every day | ORAL | 12 refills | Status: DC

## 2016-01-03 NOTE — Addendum Note (Signed)
Addended byDamita Dunnings D on: 01/03/2016 07:43 AM   Modules accepted: Orders

## 2016-01-11 DIAGNOSIS — R42 Dizziness and giddiness: Secondary | ICD-10-CM | POA: Diagnosis not present

## 2016-01-28 ENCOUNTER — Ambulatory Visit (INDEPENDENT_AMBULATORY_CARE_PROVIDER_SITE_OTHER): Payer: Medicare Other | Admitting: Internal Medicine

## 2016-01-28 ENCOUNTER — Encounter: Payer: Self-pay | Admitting: Internal Medicine

## 2016-01-28 VITALS — BP 124/76 | HR 64 | Temp 97.4°F | Resp 14 | Ht 62.0 in | Wt 146.1 lb

## 2016-01-28 DIAGNOSIS — M791 Myalgia, unspecified site: Secondary | ICD-10-CM

## 2016-01-28 DIAGNOSIS — Z0000 Encounter for general adult medical examination without abnormal findings: Secondary | ICD-10-CM | POA: Diagnosis not present

## 2016-01-28 DIAGNOSIS — R748 Abnormal levels of other serum enzymes: Secondary | ICD-10-CM

## 2016-01-28 DIAGNOSIS — I10 Essential (primary) hypertension: Secondary | ICD-10-CM

## 2016-01-28 DIAGNOSIS — E119 Type 2 diabetes mellitus without complications: Secondary | ICD-10-CM

## 2016-01-28 DIAGNOSIS — R7989 Other specified abnormal findings of blood chemistry: Secondary | ICD-10-CM

## 2016-01-28 DIAGNOSIS — F419 Anxiety disorder, unspecified: Secondary | ICD-10-CM

## 2016-01-28 LAB — BASIC METABOLIC PANEL
BUN: 28 mg/dL — ABNORMAL HIGH (ref 6–23)
CO2: 31 mEq/L (ref 19–32)
Calcium: 10.3 mg/dL (ref 8.4–10.5)
Chloride: 98 mEq/L (ref 96–112)
Creatinine, Ser: 1.1 mg/dL (ref 0.40–1.20)
GFR: 50.79 mL/min — ABNORMAL LOW (ref >60.00)
Glucose, Bld: 162 mg/dL — ABNORMAL HIGH (ref 70–99)
Potassium: 4.3 mEq/L (ref 3.5–5.1)
Sodium: 137 mEq/L (ref 135–145)

## 2016-01-28 LAB — SEDIMENTATION RATE: Sed Rate: 22 mm/hr (ref 0–30)

## 2016-01-28 LAB — HEMOGLOBIN A1C: Hgb A1c MFr Bld: 6.7 % — ABNORMAL HIGH (ref 4.6–6.5)

## 2016-01-28 LAB — CK: Total CK: 60 U/L (ref 7–177)

## 2016-01-28 LAB — TSH: TSH: 0.83 u[IU]/mL (ref 0.35–4.50)

## 2016-01-28 LAB — HOMOCYSTEINE: Homocysteine: 14.6 umol/L — ABNORMAL HIGH (ref <10.4)

## 2016-01-28 LAB — HIGH SENSITIVITY CRP: CRP, High Sensitivity: 6.15 mg/L — ABNORMAL HIGH (ref 0.000–5.000)

## 2016-01-28 NOTE — Progress Notes (Signed)
Subjective:    Patient ID: Patricia Moore, female    DOB: [birth year], 82 y.o.   MRN: [id number]  DOS:  01/28/2016 Type of visit - description : Physical exam Interval history: In addition to her physical exam, we addressed chronic medical problems and worsening shoulder pain. DM: Good med compliance, CBGs ranged from 100 to 167 HTN: Good medication compliance, ambulatory BPs within normal Anxiety: Well-controlled with Xanax, takes  infrequently S/p DEXA at gynecology: Tscore -1.9 on 11-2015   Wt Readings from Last 3 Encounters:  01/28/16 146 lb 2 oz (66.3 kg)  12/29/15 150 lb 4 oz (68.2 kg)  12/01/15 149 lb 6 oz (67.8 kg)    Review of Systems   Reports pain around the shoulders and upper back increase from baseline for 2 or 3 weeks (?), pt not sure time of onset  She denies fever, rash, pain around the hips. Headache is at baseline. She has lost a few pounds but is trying to eat healthier. She has been taking Crestor but discontinued a week ago. States that it caused severe cramps.    Constitutional: No fever. No chills.   No unusual sweats  HEENT: No dental problems, no ear discharge, no facial swelling, no voice changes. No eye discharge, no eye  redness , no  intolerance to light . + Tinnitus, saw Dr. Ezzard Standing  Respiratory: No wheezing , no  difficulty breathing. No cough , no mucus production  Cardiovascular: No CP, no leg swelling , occ  Palpitations  GI: no nausea, no vomiting, no diarrhea , no  abdominal pain.  No blood in the stools. No dysphagia, no odynophagia. Has heartburn from time to time    Endocrine: No polyphagia, no polyuria , no polydipsia  GU: No dysuria, gross hematuria, difficulty urinating. Some urinary urgency without all the other bladder symptoms  Musculoskeletal: see above  Skin: No change in the color of the skin, palor , no  Rash  Allergic, immunologic: No environmental allergies , no  food allergies  Neurological: No dizziness no  syncope.  No headaches. No diplopia, no slurred, no slurred speech, no motor deficits, no facial  Numbness  Hematological: No enlarged lymph nodes, no easy bruising , no unusual bleedings  Psychiatry: No suicidal ideas, no hallucinations, no beavior problems, no confusion.     Past Medical History:  Diagnosis Date  . Allergy   . Anxiety 12/09/2013  . Arthritis   . Back pain   . Diabetes mellitus   . Diverticulitis    patient states has had three attacks   . Diverticulosis   . DJD (degenerative joint disease) 12/14/2014  . H. pylori infection 2001  . Hx of adenomatous colonic polyps   . Hyperlipidemia   . Hypertension   . Intraductal papilloma    bx nef x 2 in the 70s-90s  . Osteoporosis   . Saccular aneurysm   . SCC (squamous cell carcinoma) 03/2011   sees dermatology    Past Surgical History:  Procedure Laterality Date  . BREAST BIOPSY  '91, '95   INTRADUCTAL PAPILLOMA  . BREAST LUMPECTOMY    . CATARACT EXTRACTION Left 09-2014    Social History   Social History  . Marital status: Married    Spouse name: N/A  . Number of children: 1  . Years of education: N/A   Occupational History  . retired  Futures trader   Social History Main Topics  . Smoking status: Never Smoker  . Smokeless tobacco: Never Used  .  Alcohol use 0.6 oz/week    1 Glasses of wine per week     Comment: OCCASIONALLY  . Drug use: No  . Sexual activity: No   Other Topics Concern  . Not on file   Social History Narrative   Born in [place], ,moved to the [place] in the 60s, live in [place] then in [place]   Lives w/ husband   H.S. graduate     Family History  Problem Relation Age of Onset  . Adopted: Yes  . Heart disease Mother 34  . Heart disease Father 85  . Breast cancer Maternal Aunt   . Stroke Maternal Aunt   . Colon cancer Paternal Uncle     7 uncles  . Colon cancer Maternal Grandfather     COLON,, FAMILY HX 7 MEMBERS  . Breast cancer Maternal Aunt   . Cancer Sister     skin  . Hypertension Neg Hx   .  Diabetes Neg Hx        Medication List       Accurate as of 01/28/16 11:59 PM. Always use your most recent med list.          ALPRAZolam 0.25 MG tablet Commonly known as:  XANAX Take 1 tablet (0.25 mg total) by mouth 3 (three) times daily as needed.   aspirin 81 MG tablet Take 81 mg by mouth daily.   baclofen 10 MG tablet Commonly known as:  LIORESAL Take 0.5 to 1 tablet at bedtime   cholecalciferol 1000 units tablet Commonly known as:  VITAMIN D Take 1,000 Units by mouth daily.   co-enzyme Q-10 30 MG capsule Take 30 mg by mouth daily.   folic acid 1 MG tablet Commonly known as:  FOLVITE Take 2 tablets (2 mg total) by mouth daily.   glyBURIDE 2.5 MG tablet Commonly known as:  DIABETA Take 1 tablet (2.5 mg total) by mouth 2 (two) times daily with a meal.   losartan-hydrochlorothiazide 100-12.5 MG tablet Commonly known as:  HYZAAR Take 1 tablet by mouth daily.   meclizine 25 MG tablet Commonly known as:  ANTIVERT Take 25 mg by mouth 3 (three) times daily as needed for dizziness.   metFORMIN 1000 MG tablet Commonly known as:  GLUCOPHAGE Take 1 tablet by mouth with breakfast, 1/2 tablet by mouth with lunch, and 1/2 tablet by mouth with dinner   ONE TOUCH ULTRA TEST test strip Generic drug:  glucose blood Reported on 09/16/2015   onetouch ultrasoft lancets Check blood sugar no more than twice daily.   traMADol 50 MG tablet Commonly known as:  ULTRAM Take 1 tablet (50 mg total) by mouth every 12 (twelve) hours as needed.          Objective:   Physical Exam BP 124/76 (BP Location: Left Arm, Patient Position: Sitting, Cuff Size: Normal)   Pulse 64   Temp 97.4 F (36.3 C) (Oral)   Resp 14   Ht 5\' 2"  (1.575 m)   Wt 146 lb 2 oz (66.3 kg)   LMP 04/17/1988   SpO2 97%   BMI 26.73 kg/m   General:   Well developed, well nourished . NAD.  Neck: No  thyromegaly  HEENT:  Normocephalic . Face symmetric, atraumatic Lungs:  CTA B Normal respiratory  effort, no intercostal retractions, no accessory muscle use. Heart: RRR,  no murmur.  No pretibial edema bilaterally  Abdomen:  Not distended, soft, non-tender. No rebound or rigidity.   Skin: Exposed areas without rash. Not  pale. Not jaundice Neurologic:  alert & oriented X3.  Speech normal, gait appropriate for age and unassisted Strength symmetric and appropriate for age.  Psych: Cognition and judgment appear intact.  Cooperative with normal attention span and concentration.  Behavior appropriate. No anxious or depressed appearing.    Assessment & Plan:  Assessment > DM HTN Hyperlipidemia -- intolerant to lipitor and crestor (severe cramps) as off 01-2016  Anxiety- xanax rx by pcp GI: --Recurrent diverticulitis (one episode documented by a CT 11-2014) --Last colonoscopy 10-2013, Dr. Marina Goodell, + polyps, melanosis coli, tics --H. pylori 2001 DJD--back pain, knees, neck pain ( on baclofen for neck) Osteoporosis  SCC, skin cancer Elevated homocysteine NEURO --Carotid US 10-2013: No significant plaque, although ICA's are serpentine, bilaterally. 40-59% bilateral ICA stenosis by velocity criteria likely due to tortuosity. Patent vertebral arteries with antegrade flow.Normal subclavian arteries, bilaterally. 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 HTN: Continue Hyzaar, check a BMP Hyperlipidemia: Intolerant to Lipitor, Crestor (dc it a week afo d/t cramps), currently not on statins. Having myalgias. We will reassess on RTC. Livalo? Refer to the cholesterol clinic?. Myalgias: At this  time, they are around the shoulder area which suggests PMR. Also could be related to Crestor which she stopped a week ago. Will check a sedimentation rate, CRP, CKs and TSH. Anxiety: Well-controlled on a sporadic Xanax Elevated homocysteine: on folic acid, check levels RTC 3 months

## 2016-01-28 NOTE — Progress Notes (Signed)
Pre visit review using our clinic review tool, if applicable. No additional management support is needed unless otherwise documented below in the visit note. 

## 2016-01-28 NOTE — Patient Instructions (Signed)
GO TO THE LAB : Get the blood work     GO TO THE FRONT DESK Schedule your next appointment for a  checkup in 3 months, fasting  If the pain around the shoulders get worse let me know   Health care power of attorney   Fall Prevention and Home Safety Falls cause injuries and can affect all age groups. It is possible to use preventive measures to significantly decrease the likelihood of falls. There are many simple measures which can make your home safer and prevent falls. OUTDOORS  Repair cracks and edges of walkways and driveways.  Remove high doorway thresholds.  Trim shrubbery on the main path into your home.  Have good outside lighting.  Clear walkways of tools, rocks, debris, and clutter.  Check that handrails are not broken and are securely fastened. Both sides of steps should have handrails.  Have leaves, snow, and ice cleared regularly.  Use sand or salt on walkways during winter months.  In the garage, clean up grease or oil spills. BATHROOM  Install night lights.  Install grab bars by the toilet and in the tub and shower.  Use non-skid mats or decals in the tub or shower.  Place a plastic non-slip stool in the shower to sit on, if needed.  Keep floors dry and clean up all water on the floor immediately.  Remove soap buildup in the tub or shower on a regular basis.  Secure bath mats with non-slip, double-sided rug tape.  Remove throw rugs and tripping hazards from the floors. BEDROOMS  Install night lights.  Make sure a bedside light is easy to reach.  Do not use oversized bedding.  Keep a telephone by your bedside.  Have a firm chair with side arms to use for getting dressed.  Remove throw rugs and tripping hazards from the floor. KITCHEN  Keep handles on pots and pans turned toward the center of the stove. Use back burners when possible.  Clean up spills quickly and allow time for drying.  Avoid walking on wet floors.  Avoid hot utensils  and knives.  Position shelves so they are not too high or low.  Place commonly used objects within easy reach.  If necessary, use a sturdy step stool with a grab bar when reaching.  Keep electrical cables out of the way.  Do not use floor polish or wax that makes floors slippery. If you must use wax, use non-skid floor wax.  Remove throw rugs and tripping hazards from the floor. STAIRWAYS  Never leave objects on stairs.  Place handrails on both sides of stairways and use them. Fix any loose handrails. Make sure handrails on both sides of the stairways are as long as the stairs.  Check carpeting to make sure it is firmly attached along stairs. Make repairs to worn or loose carpet promptly.  Avoid placing throw rugs at the top or bottom of stairways, or properly secure the rug with carpet tape to prevent slippage. Get rid of throw rugs, if possible.  Have an electrician put in a light switch at the top and bottom of the stairs. OTHER FALL PREVENTION TIPS  Wear low-heel or rubber-soled shoes that are supportive and fit well. Wear closed toe shoes.  When using a stepladder, make sure it is fully opened and both spreaders are firmly locked. Do not climb a closed stepladder.  Add color or contrast paint or tape to grab bars and handrails in your home. Place contrasting color strips on  first and last steps.  Learn and use mobility aids as needed. Install an electrical emergency response system.  Turn on lights to avoid dark areas. Replace light bulbs that burn out immediately. Get light switches that glow.  Arrange furniture to create clear pathways. Keep furniture in the same place.  Firmly attach carpet with non-skid or double-sided tape.  Eliminate uneven floor surfaces.  Select a carpet pattern that does not visually hide the edge of steps.  Be aware of all pets. OTHER HOME SAFETY TIPS  Set the water temperature for 120 F (48.8 C).  Keep emergency numbers on or near  the telephone.  Keep smoke detectors on every level of the home and near sleeping areas. Document Released: 03/24/2002 Document Revised: 10/03/2011 Document Reviewed: 06/23/2011 [facility] Patient Information 2015 [place], [place]. This information is not intended to replace advice given to you by your health care provider. Make sure you discuss any questions you have with your health care provider.   Preventive Care for Adults Ages 67 and over  Blood pressure check.** / Every 1 to 2 years.  Lipid and cholesterol check.**/ Every 5 years beginning at age 61.  Lung cancer screening. / Every year if you are aged 46-80 years and have a 30-pack-year history of smoking and currently smoke or have quit within the past 15 years. Yearly screening is stopped once you have quit smoking for at least 15 years or develop a health problem that would prevent you from having lung cancer treatment.  Fecal occult blood test (FOBT) of stool. / Every year beginning at age 61 and continuing until age 58. You may not have to do this test if you get a colonoscopy every 10 years.  Flexible sigmoidoscopy** or colonoscopy.** / Every 5 years for a flexible sigmoidoscopy or every 10 years for a colonoscopy beginning at age 27 and continuing until age 34.  Hepatitis C blood test.** / For all people born from [birth year] through 1963 and any individual with known risks for hepatitis C.  Abdominal aortic aneurysm (AAA) screening.** / A one-time screening for ages 40 to 14 years who are current or former smokers.  Skin self-exam. / Monthly.  Influenza vaccine. / Every year.  Tetanus, diphtheria, and acellular pertussis (Tdap/Td) vaccine.** / 1 dose of Td every 10 years.  Varicella vaccine.** / Consult your health care provider.  Zoster vaccine.** / 1 dose for adults aged 9 years or older.  Pneumococcal 13-valent conjugate (PCV13) vaccine.** / Consult your health care provider.  Pneumococcal polysaccharide (PPSV23)  vaccine.** / 1 dose for all adults aged 23 years and older.  Meningococcal vaccine.** / Consult your health care provider.  Hepatitis A vaccine.** / Consult your health care provider.  Hepatitis B vaccine.** / Consult your health care provider.  Haemophilus influenzae type b (Hib) vaccine.** / Consult your health care provider. **Family history and personal history of risk and conditions may change your health care provider's recommendations. Document Released: 05/30/2001 Document Revised: 04/08/2013 Document Reviewed: 08/29/2010 [facility] Patient Information 2015 [place], [place]. This information is not intended to replace advice given to you by your health care provider. Make sure you discuss any questions you have with your health care provider.

## 2016-01-28 NOTE — Assessment & Plan Note (Addendum)
Td 2007; Pneumonia shot 2014; prevnar--01-2015; zostavax-- 2011; had a flu shot  CCS: Last colonoscopy 10-2013, + polyps, next per GI  Gyn-- Dr Toney Rakes ; MMG 05-2015 , DEXA Tscore -1.9 on 11-2015; does not need PAPs per pt  Counseled: Diet, exercise, fall prevention, healthcare power of attorney.

## 2016-01-30 NOTE — Assessment & Plan Note (Signed)
DM: Continue DiaBeta, metformin, check A1c. HTN: Continue Hyzaar, check a BMP Hyperlipidemia: Intolerant to Lipitor, Crestor (dc it a week afo d/t cramps), currently not on statins. Having myalgias. We will reassess on RTC. Livalo? Refer to the cholesterol clinic?. Myalgias: At this time, they are around the shoulder area which suggests PMR. Also could be related to Crestor which she stopped a week ago. Will check a sedimentation rate, CRP, CKs and TSH. Anxiety: Well-controlled on a sporadic Xanax Elevated homocysteine: on folic acid, check levels RTC 3 months

## 2016-02-03 MED ORDER — FOLIC ACID 5 MG PO CAPS: 1.0000 | ORAL_CAPSULE | Freq: Every day | ORAL | 12 refills | Status: DC

## 2016-02-03 NOTE — Addendum Note (Signed)
Addended by: Damita Dunnings D on: 02/03/2016 11:32 AM   Modules accepted: Orders

## 2016-02-04 ENCOUNTER — Other Ambulatory Visit: Payer: Self-pay | Admitting: Internal Medicine

## 2016-02-07 NOTE — Telephone Encounter (Signed)
Rx printed, awaiting MD signature.  

## 2016-02-07 NOTE — Telephone Encounter (Signed)
Ok 30 and 5 

## 2016-02-07 NOTE — Telephone Encounter (Signed)
Pt is requesting refill on Alprazolam.  Last OV: 01/28/2016 Last Fill: 08/04/2015 #30 and 5RF Pt sig: 1 tab tid prn UDS: 12/03/2015 Low risk  Please advise.

## 2016-02-07 NOTE — Telephone Encounter (Signed)
Rx faxed to [facility]  

## 2016-02-08 ENCOUNTER — Other Ambulatory Visit: Payer: Self-pay | Admitting: Internal Medicine

## 2016-02-09 ENCOUNTER — Telehealth: Payer: Self-pay

## 2016-02-09 MED ORDER — FOLIC ACID 1 MG PO TABS: 1.0000 mg | ORAL_TABLET | Freq: Four times a day (QID) | ORAL | 12 refills | Status: DC

## 2016-02-09 NOTE — Telephone Encounter (Signed)
Folic acid 2 mg not available. Per PCP Folic Acid 1 mg 4 tablets daily.

## 2016-02-09 NOTE — Telephone Encounter (Signed)
[facility] unable to order Folic Acid 5mg  capsules. Requesting alternate medication/dosage. Per PCP okay to change to Folic Acid 2mg  3 tabs daily. Rx re-sent.

## 2016-03-01 ENCOUNTER — Encounter: Payer: Self-pay | Admitting: Neurology

## 2016-03-01 ENCOUNTER — Ambulatory Visit (INDEPENDENT_AMBULATORY_CARE_PROVIDER_SITE_OTHER): Payer: Medicare Other | Admitting: Neurology

## 2016-03-01 VITALS — BP 122/58 | HR 77 | Ht 62.0 in | Wt 148.0 lb

## 2016-03-01 DIAGNOSIS — Z8673 Personal history of transient ischemic attack (TIA), and cerebral infarction without residual deficits: Secondary | ICD-10-CM

## 2016-03-01 DIAGNOSIS — M542 Cervicalgia: Secondary | ICD-10-CM

## 2016-03-01 DIAGNOSIS — H8110 Benign paroxysmal vertigo, unspecified ear: Secondary | ICD-10-CM

## 2016-03-01 DIAGNOSIS — I671 Cerebral aneurysm, nonruptured: Secondary | ICD-10-CM | POA: Diagnosis not present

## 2016-03-01 MED ORDER — BACLOFEN 10 MG PO TABS: ORAL_TABLET | ORAL | 5 refills | Status: DC

## 2016-03-01 MED ORDER — DIAZEPAM 5 MG PO TABS: 5.0000 mg | ORAL_TABLET | Freq: Four times a day (QID) | ORAL | 0 refills | Status: DC | PRN

## 2016-03-01 MED ORDER — MECLIZINE HCL 25 MG PO TABS: 25.0000 mg | ORAL_TABLET | Freq: Three times a day (TID) | ORAL | 1 refills | Status: DC | PRN

## 2016-03-01 NOTE — Patient Instructions (Signed)
1.  Refill baclofen, diazepam and meclizine. 2.  Repeat MRA of head in January 3.  Follow up in 6 months.

## 2016-03-01 NOTE — Progress Notes (Signed)
NEUROLOGY FOLLOW UP OFFICE NOTE  Patricia Moore HS:[id number]  HISTORY OF PRESENT ILLNESS: Patricia Moore is an 82 year old right-handed female with hypertension, type 2 diabetes mellitus, hyperlipidemia, DJD and arthritis who follows up for dizziness.   UPDATE:  She is taking ASA 81mg  daily.  LDL was 99, so she was started on Lipitor 10mg  daily, however it caused left leg cramps.  CRP was slightly elevated at 6.150 but Sed rate was 22 and CK was 103.  Dizziness had been well-controlled until this past week, when she bent down to pick something up.  The vertigo is mild and positional.  She notes some mild bilateral neck pain (1-2/10).  Baclofen is effective.  She is going away to [place] for a week.  HISTORY: Since June 2016, she has been experiencing severe right-sided posterior neck pain that radiates down to the right shoulder. It does not radiate into the arm. She denies numbness or tingling of the right upper extremity or back of the head. It is painful with neck movement. Applying pressure is helpful. Pain is worse later in the day. Over the past couple of months, she had two spells of dizziness. She woke up from sleep and noted double vision and severe spinning lasting a couple of minutes. She just closed her eyes and was still until it resolved. She denied slurred speech, focal numbness or weakness or headache. She woke up on her back but reports that she turns side to side when she sleeps. It happened one other time. Since then, she still feels unsteady when she walks. She has mild dizziness when she bends over. She denies prior history of similar spells. 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 To evaluate neck pain, X-ray of cervical spine performed on 02/15/15 showed moderate multilevel degenerative disc and facet joint disease with straightening of the cervical lordosis, indicative of muscle spasm.  She has had episodes of dizziness.  She  woke up from sleep and noted double vision and severe spinning lasting a couple of minutes.  She just closed her eyes and was still until it resolved.  She denied slurred speech, focal numbness or weakness or headache.  She woke up on her back but reports that she turns side to side when she sleeps.  It happened one other time.  Since then, she still feels unsteady when she walks.  She has mild dizziness when she bends over.  She denies prior history of similar spells.  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 To evaluate neck pain, X-ray of cervical spine performed on 02/15/15 showed moderate multilevel degenerative disc and facet joint disease with straightening of the cervical lordosis, indicative of muscle spasm.  MRA of head from 04/05/15 demonstrated no correlating findings to the cerebellar legion seen on prior MRI.  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  She was evaluated by neurosurgery who did not feel that the pcomm aneurysm or cavernoma required intervention.  Neck pain has since resolved.   On the morning of 05/02/15, she woke up and felt dizzy.  She also reported horizontal double vision.  Her left eye was blood-red.  She reported slight headache but no slurred speech, gait instability or focal numbness or weakness. Diplopia lasted one to two days.  She saw the eye doctor who told her she did have "blood in the back" of her eye, perhaps having slept on it.  CT head and CTA of head and neck from 05/14/15 revealed stable small 2-3 mm right Pcomm.  She was referred to endovascular surgery for evaluation.  She was evaluated by neurosurgery who did not feel that the pcomm aneurysm or cavernoma required intervention.  Due to possible posterior circulation TIA (vertigo with diplopia), she was started on ASA 81mg  daily for secondary stroke prevention She presented to [facility] on 07/30/15 for another episode of persistent vertigo, nausea and vomiting.  CT of  head showed no acute findings.  MRI of brain was normal.  She was given low-dose steroids for possible labyrnthitis and hydration.  She followed up with her ENT who concurred that she had an inner ear dysfunction.  She was prescribed Valium and meclizine.  PAST MEDICAL HISTORY: Past Medical History:  Diagnosis Date  . Allergy   . Anxiety 12/09/2013  . Arthritis   . Back pain   . Diabetes mellitus   . Diverticulitis    patient states has had three attacks   . Diverticulosis   . DJD (degenerative joint disease) 12/14/2014  . H. pylori infection 2001  . Hx of adenomatous colonic polyps   . Hyperlipidemia   . Hypertension   . Intraductal papilloma    bx nef x 2 in the 70s-90s  . Osteoporosis   . Saccular aneurysm   . SCC (squamous cell carcinoma) 03/2011   sees dermatology    MEDICATIONS: Current Outpatient Prescriptions on File Prior to Visit  Medication Sig Dispense Refill  . ALPRAZolam (XANAX) 0.25 MG tablet Take 1 tablet (0.25 mg total) by mouth 3 (three) times daily as needed. 30 tablet 5  . aspirin 81 MG tablet Take 81 mg by mouth daily.    . cholecalciferol (VITAMIN D) 1000 UNITS tablet Take 1,000 Units by mouth daily.    Marland Kitchen co-enzyme Q-10 30 MG capsule Take 30 mg by mouth daily.    . folic acid (FOLVITE) 1 MG tablet Take 1 tablet (1 mg total) by mouth 4 (four) times daily. 120 tablet 12  . glucose blood (ONE TOUCH ULTRA TEST) test strip Check blood sugar no more than twice daily 100 each 12  . glyBURIDE (DIABETA) 2.5 MG tablet Take 1 tablet (2.5 mg total) by mouth 2 (two) times daily with a meal. 60 tablet 5  . Lancets (ONETOUCH ULTRASOFT) lancets Check blood sugar no more than twice daily. 100 each 12  . losartan-hydrochlorothiazide (HYZAAR) 100-12.5 MG tablet Take 1 tablet by mouth daily. 30 tablet 5  . metFORMIN (GLUCOPHAGE) 1000 MG tablet Take 1 tablet by mouth with breakfast, 1/2 tablet by mouth with lunch, and 1/2 tablet by mouth with dinner 60 tablet 5  . traMADol  (ULTRAM) 50 MG tablet Take 1 tablet (50 mg total) by mouth every 12 (twelve) hours as needed. (Patient not taking: Reported on 03/01/2016) 30 tablet 0   No current facility-administered medications on file prior to visit.     ALLERGIES: No Known Allergies  FAMILY HISTORY: Family History  Problem Relation Age of Onset  . Adopted: Yes  . Heart disease Mother 15  . Heart disease Father 36  . Breast cancer Maternal Aunt   . Stroke Maternal Aunt   . Colon cancer Paternal Uncle     7 uncles  . Colon cancer Maternal Grandfather     COLON,, FAMILY HX 7 MEMBERS  . Breast cancer Maternal Aunt   . Cancer Sister     skin  . Hypertension Neg Hx   .  Diabetes Neg Hx     SOCIAL HISTORY: Social History   Social History  . Marital status: Married    Spouse name: N/A  . Number of children: 1  . Years of education: N/A   Occupational History  . retired  Agricultural engineer   Social History Main Topics  . Smoking status: Never Smoker  . Smokeless tobacco: Never Used  . Alcohol use 0.6 oz/week    1 Glasses of wine per week     Comment: OCCASIONALLY  . Drug use: No  . Sexual activity: No   Other Topics Concern  . Not on file   Social History Narrative   Born in [place], ,moved to the [place] in the 60s, live in [place] then in [place] w/ husband   H.S. graduate    REVIEW OF SYSTEMS: Constitutional: No fevers, chills, or sweats, no generalized fatigue, change in appetite Eyes: No visual changes, double vision, eye pain Ear, nose and throat: No hearing loss, ear pain, nasal congestion, sore throat Cardiovascular: No chest pain, palpitations Respiratory:  No shortness of breath at rest or with exertion, wheezes GastrointestinaI: No nausea, vomiting, diarrhea, abdominal pain, fecal incontinence Genitourinary:  No dysuria, urinary retention or frequency Musculoskeletal:  No neck pain, back pain Integumentary: No rash, pruritus, skin lesions Neurological: as above Psychiatric: No depression,  insomnia, anxiety Endocrine: No palpitations, fatigue, diaphoresis, mood swings, change in appetite, change in weight, increased thirst Hematologic/Lymphatic:  No purpura, petechiae. Allergic/Immunologic: no itchy/runny eyes, nasal congestion, recent allergic reactions, rashes  PHYSICAL EXAM: Vitals:   03/01/16 0847  BP: (!) 122/58  Pulse: 77   General: No acute distress.  Patient appears well-groomed.  normal body habitus. Head:  Normocephalic/atraumatic Eyes:  Fundi examined but not visualized Neck: supple, no paraspinal tenderness, full range of motion Heart:  Regular rate and rhythm Lungs:  Clear to auscultation bilaterally Back: No paraspinal tenderness Neurological Exam: alert and oriented to person, place, and time. Attention span and concentration intact, recent and remote memory intact, fund of knowledge intact.  Speech fluent and not dysarthric, language intact.  CN II-XII intact. Bulk and tone normal, muscle strength 5/5 throughout.  Sensation to light touch  intact.  Deep tendon reflexes 2+ throughout.  Finger to nose testing intact.  Gait normal  IMPRESSION: Positional vertigo Cervical neck pain (musculoskeletal) Cerebral aneurysm History of TIA  PLAN: 1.  Will prescribe meclizine and diazepam for any potential acute vertigo while she is away (she is aware of caution for drowsiness). 2.  Baclofen for neck pain 3.  Repeat MRA of head in January 2018 4.  ASA 81mg  daily for secondary stroke prevention 5.  Follow up in 6 months.  26 minutes spent face to face with patient, over 50% spent counseling.  Metta Clines, DO  CC:  Kathlene November, MD

## 2016-04-14 ENCOUNTER — Encounter: Payer: Self-pay | Admitting: Family Medicine

## 2016-04-14 ENCOUNTER — Ambulatory Visit (INDEPENDENT_AMBULATORY_CARE_PROVIDER_SITE_OTHER): Payer: Medicare Other | Admitting: Family Medicine

## 2016-04-14 ENCOUNTER — Telehealth: Payer: Self-pay | Admitting: Internal Medicine

## 2016-04-14 VITALS — BP 126/55 | HR 73 | Temp 97.7°F | Resp 16 | Ht 62.0 in | Wt 148.8 lb

## 2016-04-14 DIAGNOSIS — K5732 Diverticulitis of large intestine without perforation or abscess without bleeding: Secondary | ICD-10-CM

## 2016-04-14 LAB — CBC WITH DIFFERENTIAL/PLATELET
Basophils Absolute: 0 cells/uL (ref 0–200)
Basophils Relative: 0 %
Eosinophils Absolute: 3850 cells/uL — ABNORMAL HIGH (ref 15–500)
Eosinophils Relative: 22 %
HCT: 40.5 % (ref 35.0–45.0)
Hemoglobin: 13.6 g/dL (ref 11.7–15.5)
Lymphocytes Relative: 18 %
Lymphs Abs: 3150 cells/uL (ref 850–3900)
MCH: 31.1 pg (ref 27.0–33.0)
MCHC: 33.6 g/dL (ref 32.0–36.0)
MCV: 92.5 fL (ref 80.0–100.0)
MPV: 9.8 fL (ref 7.5–12.5)
Monocytes Absolute: 1050 cells/uL — ABNORMAL HIGH (ref 200–950)
Monocytes Relative: 6 %
Neutro Abs: 9450 cells/uL — ABNORMAL HIGH (ref 1500–7800)
Neutrophils Relative %: 54 %
Platelets: 422 10*3/uL — ABNORMAL HIGH (ref 140–400)
RBC: 4.38 MIL/uL (ref 3.80–5.10)
RDW: 12.5 % (ref 11.0–15.0)
WBC: 17.5 10*3/uL — ABNORMAL HIGH (ref 3.8–10.8)

## 2016-04-14 LAB — COMPREHENSIVE METABOLIC PANEL
ALT: 21 U/L (ref 6–29)
AST: 28 U/L (ref 10–35)
Albumin: 4.8 g/dL (ref 3.6–5.1)
Alkaline Phosphatase: 77 U/L (ref 33–130)
BUN: 27 mg/dL — ABNORMAL HIGH (ref 7–25)
CO2: 26 mmol/L (ref 20–31)
Calcium: 10.8 mg/dL — ABNORMAL HIGH (ref 8.6–10.4)
Chloride: 96 mmol/L — ABNORMAL LOW (ref 98–110)
Creat: 1.25 mg/dL — ABNORMAL HIGH (ref 0.60–0.88)
Glucose, Bld: 83 mg/dL (ref 65–99)
Potassium: 5.4 mmol/L — ABNORMAL HIGH (ref 3.5–5.3)
Sodium: 137 mmol/L (ref 135–146)
Total Bilirubin: 0.3 mg/dL (ref 0.2–1.2)
Total Protein: 8.6 g/dL — ABNORMAL HIGH (ref 6.1–8.1)

## 2016-04-14 MED ORDER — METRONIDAZOLE 500 MG PO TABS: 500.0000 mg | ORAL_TABLET | Freq: Three times a day (TID) | ORAL | 0 refills | Status: DC

## 2016-04-14 MED ORDER — CIPROFLOXACIN HCL 500 MG PO TABS: 500.0000 mg | ORAL_TABLET | Freq: Two times a day (BID) | ORAL | 0 refills | Status: DC

## 2016-04-14 NOTE — Telephone Encounter (Signed)
Patient called stating that she is having a diverticulitis attack and she would like to get a refill of the medication she is normally prescribed for this type of attack. Please advise   Phone: [phone]

## 2016-04-14 NOTE — Telephone Encounter (Signed)
Patient has appointment scheduled for 4:00 04/14/16

## 2016-04-14 NOTE — Progress Notes (Signed)
Patient ID: Patricia Moore, female    DOB: 28-Nov-1933  Age: 82 y.o. MRN: [id number]    Subjective:  Subjective  HPI Patricia Moore presents for possible diverticulitis.  She has had 5-6 episodes this year -- one episode in [place] and one when she was out of the country.   This time pain started with 2-3 days ago.  No diarrhea, no N/v/D  + constipation ,  Some blood today.  No fevers  Pt states pt feels the same as previous episodes.    Review of Systems  Constitutional: Negative for appetite change, diaphoresis, fatigue and unexpected weight change.  Eyes: Negative for pain, redness and visual disturbance.  Respiratory: Negative for cough, chest tightness, shortness of breath and wheezing.   Cardiovascular: Negative for chest pain, palpitations and leg swelling.  Gastrointestinal: Positive for abdominal pain, blood in stool and constipation. Negative for abdominal distention, diarrhea, nausea, rectal pain and vomiting.  Endocrine: Negative for cold intolerance, heat intolerance, polydipsia, polyphagia and polyuria.  Genitourinary: Negative for difficulty urinating, dysuria and frequency.  Neurological: Negative for dizziness, light-headedness, numbness and headaches.    History Past Medical History:  Diagnosis Date  . Allergy   . Anxiety 12/09/2013  . Arthritis   . Back pain   . Diabetes mellitus   . Diverticulitis    patient states has had three attacks   . Diverticulosis   . DJD (degenerative joint disease) 12/14/2014  . H. pylori infection 2001  . Hx of adenomatous colonic polyps   . Hyperlipidemia   . Hypertension   . Intraductal papilloma    bx nef x 2 in the 70s-90s  . Osteoporosis   . Saccular aneurysm   . SCC (squamous cell carcinoma) 03/2011   sees dermatology    She has a past surgical history that includes Breast biopsy ('91, '95); Breast lumpectomy; and Cataract extraction (Left, 09-2014).   Her family history includes Breast cancer in her maternal aunt and  maternal aunt; Cancer in her sister; Colon cancer in her maternal grandfather and paternal uncle; Heart disease (age of onset: 22) in her mother; Heart disease (age of onset: 23) in her father; Stroke in her maternal aunt. She was adopted.She reports that she has never smoked. She has never used smokeless tobacco. She reports that she drinks about 0.6 oz of alcohol per week . She reports that she does not use drugs.  Current Outpatient Prescriptions on File Prior to Visit  Medication Sig Dispense Refill  . ALPRAZolam (XANAX) 0.25 MG tablet Take 1 tablet (0.25 mg total) by mouth 3 (three) times daily as needed. 30 tablet 5  . aspirin 81 MG tablet Take 81 mg by mouth daily.    . baclofen (LIORESAL) 10 MG tablet Take 0.5 to 1 tablet at bedtime 30 each 5  . cholecalciferol (VITAMIN D) 1000 UNITS tablet Take 1,000 Units by mouth daily.    Marland Kitchen co-enzyme Q-10 30 MG capsule Take 30 mg by mouth daily.    . diazepam (VALIUM) 5 MG tablet Take 1 tablet (5 mg total) by mouth every 6 (six) hours as needed for anxiety. 5 tablet 0  . folic acid (FOLVITE) 1 MG tablet Take 1 tablet (1 mg total) by mouth 4 (four) times daily. 120 tablet 12  . glucose blood (ONE TOUCH ULTRA TEST) test strip Check blood sugar no more than twice daily 100 each 12  . glyBURIDE (DIABETA) 2.5 MG tablet Take 1 tablet (2.5 mg total) by mouth 2 (two)  times daily with a meal. 60 tablet 5  . Lancets (ONETOUCH ULTRASOFT) lancets Check blood sugar no more than twice daily. 100 each 12  . losartan-hydrochlorothiazide (HYZAAR) 100-12.5 MG tablet Take 1 tablet by mouth daily. 30 tablet 5  . meclizine (ANTIVERT) 25 MG tablet Take 1 tablet (25 mg total) by mouth 3 (three) times daily as needed for dizziness. 30 tablet 1  . metFORMIN (GLUCOPHAGE) 1000 MG tablet Take 1 tablet by mouth with breakfast, 1/2 tablet by mouth with lunch, and 1/2 tablet by mouth with dinner 60 tablet 5   No current facility-administered medications on file prior to visit.        Objective:  Objective  Physical Exam  Constitutional: She is oriented to person, place, and time. She appears well-developed and well-nourished.  HENT:  Head: Normocephalic and atraumatic.  Eyes: Conjunctivae and EOM are normal.  Neck: Normal range of motion. Neck supple. No JVD present. Carotid bruit is not present. No thyromegaly present.  Cardiovascular: Normal rate, regular rhythm and normal heart sounds.   No murmur heard. Pulmonary/Chest: Effort normal and breath sounds normal. No respiratory distress. She has no wheezes. She has no rales. She exhibits no tenderness.  Abdominal: Soft. Bowel sounds are normal. She exhibits no distension and no mass. There is tenderness in the left lower quadrant. There is no rebound and no guarding.  Musculoskeletal: She exhibits no edema.  Neurological: She is alert and oriented to person, place, and time.  Psychiatric: She has a normal mood and affect.  Nursing note and vitals reviewed.  BP (!) 126/55 (BP Location: Left Arm, Cuff Size: Normal)   Pulse 73   Temp 97.7 F (36.5 C) (Oral)   Resp 16   Ht 5\' 2"  (1.575 m)   Wt 148 lb 12.8 oz (67.5 kg)   LMP 04/17/1988   SpO2 99%   BMI 27.22 kg/m  Wt Readings from Last 3 Encounters:  04/14/16 148 lb 12.8 oz (67.5 kg)  03/01/16 148 lb (67.1 kg)  01/28/16 146 lb 2 oz (66.3 kg)     Lab Results  Component Value Date   WBC 17.5 (H) 04/14/2016   HGB 13.6 04/14/2016   HCT 40.5 04/14/2016   PLT 422 (H) 04/14/2016   GLUCOSE 162 (H) 01/28/2016   CHOL 153 12/06/2015   TRIG 104.0 12/06/2015   HDL 56.40 12/06/2015   LDLDIRECT 131.9 10/28/2010   LDLCALC 76 12/06/2015   ALT 19 09/16/2015   AST 25 09/16/2015   NA 137 01/28/2016   K 4.3 01/28/2016   CL 98 01/28/2016   CREATININE 1.10 01/28/2016   BUN 28 (H) 01/28/2016   CO2 31 01/28/2016   TSH 0.83 01/28/2016   HGBA1C 6.7 (H) 01/28/2016   MICROALBUR <0.7 06/04/2014    Dg Chest 2 View  Result Date: 07/30/2015 CLINICAL DATA:  Dizziness and  left-sided chest pain associated with vomiting EXAM: CHEST  2 VIEW COMPARISON:  PA and lateral chest x-ray of May 21, 2013. FINDINGS: The lungs are adequately inflated. There is no focal infiltrate. There is no pleural effusion. The heart is normal in size. The pulmonary vascularity is not engorged. The mediastinum is normal in width. The trachea is midline. The bony thorax exhibits no acute abnormality. IMPRESSION: There is no active cardiopulmonary disease. Electronically Signed   By: David  [place] M.D.   On: 07/30/2015 10:41   Ct Head Wo Contrast  Result Date: 07/30/2015 CLINICAL DATA:  Dizziness starting at 3 a.m. with vomiting. EXAM: CT  HEAD WITHOUT CONTRAST TECHNIQUE: Contiguous axial images were obtained from the base of the skull through the vertex without intravenous contrast. COMPARISON:  05/14/2015 FINDINGS: Skull and Sinuses:Negative for fracture or destructive process. Chronic mucosal thickening or debris on the intersinus septum of the left sphenoid sinus. Visualized orbits: Left cataract resection. Brain: No evidence of acute infarction, hemorrhage, hydrocephalus, or mass lesion/mass effect. Mild generalized cerebral volume loss that is expected for age. IMPRESSION: Unremarkable head CT for age. Electronically Signed   By: Marnee Spring M.D.   On: 07/30/2015 13:33   Mr Brain Wo Contrast  Result Date: 07/30/2015 CLINICAL DATA:  Woke up at 3 a.m. with severe nausea, vomiting, dizziness and LEFT mid arm pain. Similar symptoms 1 month ago. History of hypertension and diabetes. EXAM: MRI HEAD WITHOUT CONTRAST TECHNIQUE: Multiplanar, multiecho pulse sequences of the brain and surrounding structures were obtained without intravenous contrast. COMPARISON:  MRI head February 23, 2015 and CT head July 30, 2015 at 1321 hours FINDINGS: The ventricles and sulci are normal for patient's age. No abnormal parenchymal signal, mass lesions, mass effect. No reduced diffusion to suggest acute ischemia. No  susceptibility artifact to suggest hemorrhage. Punctate focus of susceptibility artifact LEFT cerebellum, no residual surrounding signal abnormality. No abnormal extra-axial fluid collections. No extra-axial masses though, contrast enhanced sequences would be more sensitive. Normal major intracranial vascular flow voids seen at the skull base. Status post LEFT ocular lens implant. No abnormal sellar expansion. No suspicious calvarial bone marrow signal. Craniocervical junction maintained. Cerebellar tonsils at but not below the foramen magnum. Mild less sphenoid mucosal thickening without paranasal sinus air-fluid levels. Mastoid air cells are well aerated. IMPRESSION: Normal MRI head for age. Electronically Signed   By: Awilda Metro M.D.   On: 07/30/2015 22:38     Assessment & Plan:  Plan  I have discontinued Ms. Topp's traMADol. I am also having her start on metroNIDAZOLE and ciprofloxacin. Additionally, I am having her maintain her cholecalciferol, aspirin, losartan-hydrochlorothiazide, co-enzyme Q-10, glyBURIDE, metFORMIN, onetouch ultrasoft, ALPRAZolam, glucose blood, folic acid, baclofen, diazepam, meclizine, and dorzolamide-timolol.  Meds ordered this encounter  Medications  . dorzolamide-timolol (COSOPT) 22.3-6.8 MG/ML ophthalmic solution  . metroNIDAZOLE (FLAGYL) 500 MG tablet    Sig: Take 1 tablet (500 mg total) by mouth 3 (three) times daily.    Dispense:  21 tablet    Refill:  0  . ciprofloxacin (CIPRO) 500 MG tablet    Sig: Take 1 tablet (500 mg total) by mouth 2 (two) times daily.    Dispense:  20 tablet    Refill:  0    Problem List Items Addressed This Visit      Unprioritized   Diverticulitis - Primary   Relevant Medications   metroNIDAZOLE (FLAGYL) 500 MG tablet   ciprofloxacin (CIPRO) 500 MG tablet   Other Relevant Orders   Ambulatory referral to Gastroenterology   CBC with Differential/Platelet (Completed)   Comprehensive metabolic panel (Completed)    pt to  go to er if pain worsens  Follow-up: No Follow-up on file.  Donato Schultz, DO

## 2016-04-14 NOTE — Patient Instructions (Signed)

## 2016-04-14 NOTE — Progress Notes (Signed)
Pre visit review using our clinic review tool, if applicable. No additional management support is needed unless otherwise documented below in the visit note. 

## 2016-04-26 ENCOUNTER — Ambulatory Visit (INDEPENDENT_AMBULATORY_CARE_PROVIDER_SITE_OTHER): Payer: Medicare Other | Admitting: Internal Medicine

## 2016-04-26 ENCOUNTER — Encounter: Payer: Self-pay | Admitting: Internal Medicine

## 2016-04-26 VITALS — BP 132/68 | HR 71 | Temp 98.0°F | Resp 14 | Ht 62.0 in | Wt 145.2 lb

## 2016-04-26 DIAGNOSIS — K5732 Diverticulitis of large intestine without perforation or abscess without bleeding: Secondary | ICD-10-CM | POA: Diagnosis not present

## 2016-04-26 DIAGNOSIS — E785 Hyperlipidemia, unspecified: Secondary | ICD-10-CM

## 2016-04-26 DIAGNOSIS — M791 Myalgia, unspecified site: Secondary | ICD-10-CM

## 2016-04-26 DIAGNOSIS — I10 Essential (primary) hypertension: Secondary | ICD-10-CM | POA: Diagnosis not present

## 2016-04-26 DIAGNOSIS — R062 Wheezing: Secondary | ICD-10-CM

## 2016-04-26 LAB — BASIC METABOLIC PANEL
BUN: 19 mg/dL (ref 6–23)
CO2: 28 mEq/L (ref 19–32)
Calcium: 10.3 mg/dL (ref 8.4–10.5)
Chloride: 100 mEq/L (ref 96–112)
Creatinine, Ser: 0.92 mg/dL (ref 0.40–1.20)
GFR: 62.38 mL/min (ref >60.00)
Glucose, Bld: 100 mg/dL — ABNORMAL HIGH (ref 70–99)
Potassium: 4.4 mEq/L (ref 3.5–5.1)
Sodium: 137 mEq/L (ref 135–145)

## 2016-04-26 LAB — CBC WITH DIFFERENTIAL/PLATELET
Basophils Absolute: 0.1 10*3/uL (ref 0.0–0.1)
Basophils Relative: 0.8 % (ref 0.0–3.0)
Eosinophils Absolute: 2.1 10*3/uL — ABNORMAL HIGH (ref 0.0–0.7)
Eosinophils Relative: 16 % — ABNORMAL HIGH (ref 0.0–5.0)
HCT: 39.1 % (ref 36.0–46.0)
Hemoglobin: 13.5 g/dL (ref 12.0–15.0)
Lymphocytes Relative: 27.1 % (ref 12.0–46.0)
Lymphs Abs: 3.6 10*3/uL (ref 0.7–4.0)
MCHC: 34.5 g/dL (ref 30.0–36.0)
MCV: 90.4 fl (ref 78.0–100.0)
Monocytes Absolute: 0.9 10*3/uL (ref 0.1–1.0)
Monocytes Relative: 7 % (ref 3.0–12.0)
Neutro Abs: 6.6 10*3/uL (ref 1.4–7.7)
Neutrophils Relative %: 49.1 % (ref 43.0–77.0)
Platelets: 444 10*3/uL — ABNORMAL HIGH (ref 150.0–400.0)
RBC: 4.33 Mil/uL (ref 3.87–5.11)
RDW: 12.3 % (ref 11.5–15.5)
WBC: 13.4 10*3/uL — ABNORMAL HIGH (ref 4.0–10.5)

## 2016-04-26 MED ORDER — RANITIDINE HCL 300 MG PO TABS: 300.0000 mg | ORAL_TABLET | Freq: Every day | ORAL | 6 refills | Status: DC

## 2016-04-26 NOTE — Progress Notes (Signed)
Subjective:    Patient ID: Patricia Moore, female    DOB: 11-02-1933, 83 y.o.   MRN: [id number]  DOS:  04/26/2016 Type of visit - description : Follow-up Interval history: Was seen 04/14/2016 with left lower quadrant abdominal pain. No fever, nausea, diarrhea, some constipation. Was diagnosed with diverticulitis, took Cipro and Flagyl. She quickly improved, now is "sometimes tender when I touch the area " but otherwise asymptomatic. Labs were done, WBCs, potassium, creatinine where elevated   Also, for the last year or so, when she lays down she hears some wheezing at night. Sometimes associated with cough.  Review of Systems At this point, denies myalgias. Appetite is okay No diarrhea or constipation No dysuria or gross hematuria. Denies heartburn per se but admits a very bitter taste in her throat at night. GERD?  Past Medical History:  Diagnosis Date  . Allergy   . Anxiety 12/09/2013  . Arthritis   . Back pain   . Diabetes mellitus   . Diverticulitis    patient states has had three attacks   . Diverticulosis   . DJD (degenerative joint disease) 12/14/2014  . H. pylori infection 2001  . Hx of adenomatous colonic polyps   . Hyperlipidemia   . Hypertension   . Intraductal papilloma    bx nef x 2 in the 70s-90s  . Osteoporosis   . Saccular aneurysm   . SCC (squamous cell carcinoma) 03/2011   sees dermatology    Past Surgical History:  Procedure Laterality Date  . BREAST BIOPSY  '91, '95   INTRADUCTAL PAPILLOMA  . BREAST LUMPECTOMY    . CATARACT EXTRACTION Left 09-2014    Social History   Social History  . Marital status: Married    Spouse name: N/A  . Number of children: 1  . Years of education: N/A   Occupational History  . retired  Futures trader   Social History Main Topics  . Smoking status: Never Smoker  . Smokeless tobacco: Never Used  . Alcohol use 0.6 oz/week    1 Glasses of wine per week     Comment: OCCASIONALLY  . Drug use: No  . Sexual activity:  No   Other Topics Concern  . Not on file   Social History Narrative   Born in [place], ,moved to the [place] in the 60s, live in [place] then in [place]   Lives w/ husband   H.S. graduate      Allergies as of 04/26/2016   No Known Allergies     Medication List       Accurate as of 04/26/16  4:02 PM. Always use your most recent med list.          ALPRAZolam 0.25 MG tablet Commonly known as:  XANAX Take 1 tablet (0.25 mg total) by mouth 3 (three) times daily as needed.   aspirin 81 MG tablet Take 81 mg by mouth daily.   baclofen 10 MG tablet Commonly known as:  LIORESAL Take 0.5 to 1 tablet at bedtime   cholecalciferol 1000 units tablet Commonly known as:  VITAMIN D Take 1,000 Units by mouth daily.   co-enzyme Q-10 30 MG capsule Take 30 mg by mouth daily.   diazepam 5 MG tablet Commonly known as:  VALIUM Take 1 tablet (5 mg total) by mouth every 6 (six) hours as needed for anxiety.   dorzolamide-timolol 22.3-6.8 MG/ML ophthalmic solution Commonly known as:  COSOPT   folic acid 1 MG tablet Commonly known as:  FOLVITE Take 1 tablet (1 mg total) by mouth 4 (four) times daily.   glucose blood test strip Commonly known as:  ONE TOUCH ULTRA TEST Check blood sugar no more than twice daily   glyBURIDE 2.5 MG tablet Commonly known as:  DIABETA Take 1 tablet (2.5 mg total) by mouth 2 (two) times daily with a meal.   losartan-hydrochlorothiazide 100-12.5 MG tablet Commonly known as:  HYZAAR Take 1 tablet by mouth daily.   meclizine 25 MG tablet Commonly known as:  ANTIVERT Take 1 tablet (25 mg total) by mouth 3 (three) times daily as needed for dizziness.   metFORMIN 1000 MG tablet Commonly known as:  GLUCOPHAGE Take 1 tablet by mouth with breakfast, 1/2 tablet by mouth with lunch, and 1/2 tablet by mouth with dinner   onetouch ultrasoft lancets Check blood sugar no more than twice daily.   ranitidine 300 MG tablet Commonly known as:  ZANTAC Take 1 tablet (300 mg total)  by mouth at bedtime.          Objective:   Physical Exam BP 132/68 (BP Location: Left Arm, Patient Position: Sitting, Cuff Size: Small)   Pulse 71   Temp 98 F (36.7 C) (Oral)   Resp 14   Ht 5\' 2"  (1.575 m)   Wt 145 lb 4 oz (65.9 kg)   LMP 04/17/1988   SpO2 98%   BMI 26.57 kg/m  General:   Well developed, well nourished . NAD.  HEENT:  Normocephalic . Face symmetric, atraumatic Neck: No JVD at 45 Lungs:  CTA B Normal respiratory effort, no intercostal retractions, no accessory muscle use. Heart: RRR,  no murmur.  No pretibial edema bilaterally  Abdomen: Soft, nontender, normal bowel sounds Skin: Not pale. Not jaundice Neurologic:  alert & oriented X3.  Speech normal, gait appropriate for age and unassisted Psych--  Cognition and judgment appear intact.  Cooperative with normal attention span and concentration.  Behavior appropriate. No anxious or depressed appearing.      Assessment & Plan:   Assessment > DM HTN Hyperlipidemia -- intolerant to lipitor and crestor (severe cramps) as off 01-2016  Anxiety- xanax rx by pcp GI: --Recurrent diverticulitis (one episode documented by a CT 11-2014) --Last colonoscopy 10-2013, Dr. Marina Goodell, + polyps, melanosis coli, tics --H. pylori 2001 DJD--back pain, knees, neck pain ( on baclofen for neck) Osteoporosis  SCC, skin cancer Elevated homocysteine NEURO --Carotid US 10-2013: No significant plaque, although ICA's are serpentine, bilaterally. 40-59% bilateral ICA stenosis by velocity criteria likely due to tortuosity. Patent vertebral arteries with antegrade flow.Normal subclavian arteries, bilaterally. 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  Age and comorbidities are likely an issue. Recheck a CBC HTN: Last potassium and creatinine were elevated, recheck a BMP Myalgias: See last visit, off Crestor, no further symptoms Hyperlipidemia: On diet control, Crestor caused cramps Elevated homocysteine: Was prescribed folic acid, recheck on RTC Wheezing, nocturnal: No  CHF on clinical grounds, echo  essentially normal 06-2015. No smoker, no history of asthma.  GERD?  Recommend Zantac at night. Observation otherwise. RTC 4 weeks

## 2016-04-26 NOTE — Patient Instructions (Addendum)
   GO TO THE LAB : Get the blood work     GO TO THE FRONT DESK Schedule your next appointment for a  checkup in 4 weeks from today, cancel the appointment for next week   Start taking Zantac (ranitidine) 300 mg every night

## 2016-04-26 NOTE — Assessment & Plan Note (Signed)
Diverticulitis: Last episode essentially resolved;  this is a recurrent issue, will refer to GI --->  candidate for elective colectomy?.  Age and comorbidities are likely an issue. Recheck a CBC HTN: Last potassium and creatinine were elevated, recheck a BMP Myalgias: See last visit, off Crestor, no further symptoms Hyperlipidemia: On diet control, Crestor caused cramps Elevated homocysteine: Was prescribed folic acid, recheck on RTC Wheezing, nocturnal: No  CHF on clinical grounds, echo  essentially normal 06-2015. No smoker, no history of asthma.  GERD?  Recommend Zantac at night. Observation otherwise. RTC 4 weeks

## 2016-04-26 NOTE — Progress Notes (Signed)
Pre visit review using our clinic review tool, if applicable. No additional management support is needed unless otherwise documented below in the visit note. 

## 2016-05-04 ENCOUNTER — Ambulatory Visit: Payer: Medicare Other | Admitting: Internal Medicine

## 2016-05-05 ENCOUNTER — Ambulatory Visit (INDEPENDENT_AMBULATORY_CARE_PROVIDER_SITE_OTHER): Payer: Medicare Other | Admitting: Internal Medicine

## 2016-05-05 ENCOUNTER — Encounter: Payer: Self-pay | Admitting: Internal Medicine

## 2016-05-05 VITALS — BP 124/82 | HR 87 | Temp 98.1°F | Resp 14 | Ht 62.0 in | Wt 147.1 lb

## 2016-05-05 DIAGNOSIS — J40 Bronchitis, not specified as acute or chronic: Secondary | ICD-10-CM

## 2016-05-05 MED ORDER — AZITHROMYCIN 250 MG PO TABS: ORAL_TABLET | ORAL | 0 refills | Status: DC

## 2016-05-05 NOTE — Progress Notes (Signed)
Subjective:    Patient ID: Patricia Moore, female    DOB: 09-15-1933, 83 y.o.   MRN: [id number]  DOS:  05/05/2016 Type of visit - description : Acute visit Interval history: Symptoms started 4 days ago with cough, at times severe, associated with chest pain located at the v\bottom of ant and post chest Husband  with similar symptoms, already treated at this office. On and off subjective fever, just today she measured her temperature >> ~ 100, took Tylenol and felt better   Review of Systems Denies nausea, vomiting, diarrhea, minimal shortness of breath only when she coughs. Sinuses without congestion or discharge.  Past Medical History:  Diagnosis Date  . Allergy   . Anxiety 12/09/2013  . Arthritis   . Back pain   . Diabetes mellitus   . Diverticulitis    patient states has had three attacks   . Diverticulosis   . DJD (degenerative joint disease) 12/14/2014  . H. pylori infection 2001  . Hx of adenomatous colonic polyps   . Hyperlipidemia   . Hypertension   . Intraductal papilloma    bx nef x 2 in the 70s-90s  . Osteoporosis   . Saccular aneurysm   . SCC (squamous cell carcinoma) 03/2011   sees dermatology    Past Surgical History:  Procedure Laterality Date  . BREAST BIOPSY  '91, '95   INTRADUCTAL PAPILLOMA  . BREAST LUMPECTOMY    . CATARACT EXTRACTION Left 09-2014    Social History   Social History  . Marital status: Married    Spouse name: N/A  . Number of children: 1  . Years of education: N/A   Occupational History  . retired  Futures trader   Social History Main Topics  . Smoking status: Never Smoker  . Smokeless tobacco: Never Used  . Alcohol use 0.6 oz/week    1 Glasses of wine per week     Comment: OCCASIONALLY  . Drug use: No  . Sexual activity: No   Other Topics Concern  . Not on file   Social History Narrative   Born in [place], ,moved to the [place] in the 60s, live in [place] then in [place]   Lives w/ husband   H.S. graduate      Allergies as of  05/05/2016   No Known Allergies     Medication List       Accurate as of 05/05/16  5:51 PM. Always use your most recent med list.          ALPRAZolam 0.25 MG tablet Commonly known as:  XANAX Take 1 tablet (0.25 mg total) by mouth 3 (three) times daily as needed.   aspirin 81 MG tablet Take 81 mg by mouth daily.   azithromycin 250 MG tablet Commonly known as:  ZITHROMAX Z-PAK 2 tabs a day the first day, then 1 tab a day x 4 days   baclofen 10 MG tablet Commonly known as:  LIORESAL Take 0.5 to 1 tablet at bedtime   cholecalciferol 1000 units tablet Commonly known as:  VITAMIN D Take 1,000 Units by mouth daily.   co-enzyme Q-10 30 MG capsule Take 30 mg by mouth daily.   diazepam 5 MG tablet Commonly known as:  VALIUM Take 1 tablet (5 mg total) by mouth every 6 (six) hours as needed for anxiety.   dorzolamide-timolol 22.3-6.8 MG/ML ophthalmic solution Commonly known as:  COSOPT   folic acid 1 MG tablet Commonly known as:  FOLVITE Take 1 tablet (1  mg total) by mouth 4 (four) times daily.   glucose blood test strip Commonly known as:  ONE TOUCH ULTRA TEST Check blood sugar no more than twice daily   glyBURIDE 2.5 MG tablet Commonly known as:  DIABETA Take 1 tablet (2.5 mg total) by mouth 2 (two) times daily with a meal.   losartan-hydrochlorothiazide 100-12.5 MG tablet Commonly known as:  HYZAAR Take 1 tablet by mouth daily.   meclizine 25 MG tablet Commonly known as:  ANTIVERT Take 1 tablet (25 mg total) by mouth 3 (three) times daily as needed for dizziness.   metFORMIN 1000 MG tablet Commonly known as:  GLUCOPHAGE Take 1 tablet by mouth with breakfast, 1/2 tablet by mouth with lunch, and 1/2 tablet by mouth with dinner   onetouch ultrasoft lancets Check blood sugar no more than twice daily.   ranitidine 300 MG tablet Commonly known as:  ZANTAC Take 1 tablet (300 mg total) by mouth at bedtime.          Objective:   Physical Exam BP 124/82 (BP  Location: Left Arm, Patient Position: Sitting, Cuff Size: Normal)   Pulse 87   Temp 98.1 F (36.7 C) (Oral)   Resp 14   Ht 5\' 2"  (1.575 m)   Wt 147 lb 2 oz (66.7 kg)   LMP 04/17/1988   SpO2 98%   BMI 26.91 kg/m  General:   Well developed, well nourished . NAD.  HEENT:  Normocephalic . Face symmetric, atraumatic. TMs normal, throat symmetric, no red. Nose minimal congestion Lungs:  Large airway congestion with cough, no wheezing, very few rhonchi, no crackles. Normal respiratory effort, no intercostal retractions, no accessory muscle use. Heart: RRR,  no murmur.  No pretibial edema bilaterally  Skin: Not pale. Not jaundice Neurologic:  alert & oriented X3.  Speech normal, gait appropriate for age and unassisted Psych--  Cognition and judgment appear intact.  Cooperative with normal attention span and concentration.  Behavior appropriate. No anxious or depressed appearing.      Assessment & Plan:   Assessment > DM HTN Hyperlipidemia -- intolerant to lipitor and crestor (severe cramps) as off 01-2016  Anxiety- xanax rx by pcp GI: --Recurrent diverticulitis (one episode documented by a CT 11-2014) --Last colonoscopy 10-2013, Dr. Marina Goodell, + polyps, melanosis coli, tics --H. pylori 2001 DJD--back pain, knees, neck pain ( on baclofen for neck) Osteoporosis  SCC, skin cancer Elevated homocysteine NEURO --Carotid US 10-2013: No significant plaque, although ICA's are serpentine, bilaterally. 40-59% bilateral ICA stenosis by velocity criteria likely due to tortuosity. Patent vertebral arteries with antegrade flow.Normal subclavian arteries, bilaterally. -- Transinet dizziness, diplopia sx late 2016 , saw neuro >>> Dx w/ TIA/Stroke on clinical grounds  W/u:  Brain  MRI 03-2015 >> 2 mm saccular aneurysm at the right posterior communicating artery origin, saw  Neurology, Rx to recheck MRI 1 year, ok continue ASA per neuro CTA head, neck 05-14-2015: aneurysm confirmed, otherwise  (-) --Admitted 07-2015, dizziness, MRI negative, DX labyrinthitis    PLAN: Bronchitis: Sx likely d/t bronchitis,   rx Zithromax and supportive  treatment. Call if no better

## 2016-05-05 NOTE — Patient Instructions (Signed)
Rest, fluids , tylenol  For cough:  Take Mucinex DM twice a day as needed until better  For nasal congestion: Use OTC Nasocort or Flonase : 2 nasal sprays on each side of the nose in the morning until you feel better  Avoid decongestants such as  Pseudoephedrine or phenylephrine    Take the antibiotic as prescribed  (zithromax )  Call if not gradually better over the next  10 days  Call anytime if the symptoms are severe

## 2016-05-05 NOTE — Progress Notes (Signed)
Pre visit review using our clinic review tool, if applicable. No additional management support is needed unless otherwise documented below in the visit note. 

## 2016-05-05 NOTE — Assessment & Plan Note (Signed)
Bronchitis: Sx likely d/t bronchitis,   rx Zithromax and supportive  treatment. Call if no better

## 2016-05-08 ENCOUNTER — Ambulatory Visit (INDEPENDENT_AMBULATORY_CARE_PROVIDER_SITE_OTHER): Payer: Medicare Other | Admitting: Internal Medicine

## 2016-05-08 ENCOUNTER — Ambulatory Visit (HOSPITAL_BASED_OUTPATIENT_CLINIC_OR_DEPARTMENT_OTHER)
Admission: RE | Admit: 2016-05-08 | Discharge: 2016-05-08 | Disposition: A | Discharge status: Home / Self Care | Payer: Medicare Other | Source: Ambulatory Visit | Attending: Internal Medicine | Admitting: Internal Medicine

## 2016-05-08 ENCOUNTER — Encounter: Payer: Self-pay | Admitting: Internal Medicine

## 2016-05-08 VITALS — BP 134/72 | HR 73 | Temp 98.3°F | Resp 14 | Ht 62.0 in | Wt 147.0 lb

## 2016-05-08 DIAGNOSIS — J209 Acute bronchitis, unspecified: Secondary | ICD-10-CM | POA: Insufficient documentation

## 2016-05-08 DIAGNOSIS — R05 Cough: Secondary | ICD-10-CM | POA: Diagnosis not present

## 2016-05-08 MED ORDER — BENZONATATE 200 MG PO CAPS: 200.0000 mg | ORAL_CAPSULE | Freq: Three times a day (TID) | ORAL | 0 refills | Status: DC | PRN

## 2016-05-08 NOTE — Assessment & Plan Note (Signed)
Bronchitis:  Dx w/  Bronchitis 3 days ago, not back to baseline but has make some progress. Finishing antibiotics today or tomorrow. To be sure will get a chest x-ray otherwise I recommend to continue with same treatment and give it more time. For cough control, she is taking robitussin (w/o "DM")  recommend to take Mucinex DM or similar, add Tessalon.

## 2016-05-08 NOTE — Progress Notes (Signed)
Pre visit review using our clinic review tool, if applicable. No additional management support is needed unless otherwise documented below in the visit note. 

## 2016-05-08 NOTE — Patient Instructions (Signed)
Get your x-ray before you leave  Continue with rest, fluids, Tylenol  Try to get Mucinex DM or similar medication  In addition, take Tessalon Perles as needed for cough. I sent a prescription,  is also a cough medication that you can take in addition to Mucinex DM.  Call if not gradually improving in the next week

## 2016-05-08 NOTE — Progress Notes (Signed)
Subjective:    Patient ID: Patricia Moore, female    DOB: 1933-12-24, 83 y.o.   MRN: [id number]  DOS:  05/08/2016 Type of visit - description : Acute visit Interval history: Was diagnosed with bronchitis 3 days ago, taking Zithromax. She is here because she is not much better. Continue with rib pain when she coughs. Continue with cough, no sputum production. Sometimes is difficult for her to sleep due to cough. No high fever, temperature has raised up to 99. Taking Tylenol. Taking Robitussin but was not able to get Mucinex DM.  Review of Systems  + Postnasal dripping (clear fluid). No nausea or vomiting. Have myalgias, they are improving. Diabetes slightly worse, CBG around 200 over the last few days, likely due to acute illness.  Past Medical History:  Diagnosis Date  . Allergy   . Anxiety 12/09/2013  . Arthritis   . Back pain   . Diabetes mellitus   . Diverticulitis    patient states has had three attacks   . Diverticulosis   . DJD (degenerative joint disease) 12/14/2014  . H. pylori infection 2001  . Hx of adenomatous colonic polyps   . Hyperlipidemia   . Hypertension   . Intraductal papilloma    bx nef x 2 in the 70s-90s  . Osteoporosis   . Saccular aneurysm   . SCC (squamous cell carcinoma) 03/2011   sees dermatology    Past Surgical History:  Procedure Laterality Date  . BREAST BIOPSY  '91, '95   INTRADUCTAL PAPILLOMA  . BREAST LUMPECTOMY    . CATARACT EXTRACTION Left 09-2014    Social History   Social History  . Marital status: Married    Spouse name: N/A  . Number of children: 1  . Years of education: N/A   Occupational History  . retired  Futures trader   Social History Main Topics  . Smoking status: Never Smoker  . Smokeless tobacco: Never Used  . Alcohol use 0.6 oz/week    1 Glasses of wine per week     Comment: OCCASIONALLY  . Drug use: No  . Sexual activity: No   Other Topics Concern  . Not on file   Social History Narrative   Born in  [place], ,moved to the [place] in the 60s, live in [place] then in [place]   Lives w/ husband   H.S. graduate      Allergies as of 05/08/2016   No Known Allergies     Medication List       Accurate as of 05/08/16  6:18 PM. Always use your most recent med list.          ALPRAZolam 0.25 MG tablet Commonly known as:  XANAX Take 1 tablet (0.25 mg total) by mouth 3 (three) times daily as needed.   aspirin 81 MG tablet Take 81 mg by mouth daily.   azithromycin 250 MG tablet Commonly known as:  ZITHROMAX Z-PAK 2 tabs a day the first day, then 1 tab a day x 4 days   baclofen 10 MG tablet Commonly known as:  LIORESAL Take 0.5 to 1 tablet at bedtime   benzonatate 200 MG capsule Commonly known as:  TESSALON Take 1 capsule (200 mg total) by mouth 3 (three) times daily as needed for cough.   cholecalciferol 1000 units tablet Commonly known as:  VITAMIN D Take 1,000 Units by mouth daily.   co-enzyme Q-10 30 MG capsule Take 30 mg by mouth daily.   diazepam 5 MG  tablet Commonly known as:  VALIUM Take 1 tablet (5 mg total) by mouth every 6 (six) hours as needed for anxiety.   dorzolamide-timolol 22.3-6.8 MG/ML ophthalmic solution Commonly known as:  COSOPT   folic acid 1 MG tablet Commonly known as:  FOLVITE Take 1 tablet (1 mg total) by mouth 4 (four) times daily.   glucose blood test strip Commonly known as:  ONE TOUCH ULTRA TEST Check blood sugar no more than twice daily   glyBURIDE 2.5 MG tablet Commonly known as:  DIABETA Take 1 tablet (2.5 mg total) by mouth 2 (two) times daily with a meal.   losartan-hydrochlorothiazide 100-12.5 MG tablet Commonly known as:  HYZAAR Take 1 tablet by mouth daily.   meclizine 25 MG tablet Commonly known as:  ANTIVERT Take 1 tablet (25 mg total) by mouth 3 (three) times daily as needed for dizziness.   metFORMIN 1000 MG tablet Commonly known as:  GLUCOPHAGE Take 1 tablet by mouth with breakfast, 1/2 tablet by mouth with lunch, and 1/2 tablet  by mouth with dinner   onetouch ultrasoft lancets Check blood sugar no more than twice daily.   ranitidine 300 MG tablet Commonly known as:  ZANTAC Take 1 tablet (300 mg total) by mouth at bedtime.          Objective:   Physical Exam BP 134/72 (BP Location: Left Arm, Patient Position: Sitting, Cuff Size: Small)   Pulse 73   Temp 98.3 F (36.8 C) (Oral)   Resp 14   Ht 5\' 2"  (1.575 m)   Wt 147 lb (66.7 kg)   LMP 04/17/1988   SpO2 93%   BMI 26.89 kg/m  General:   Well developed, well nourished . NAD.  HEENT:  Normocephalic . Face symmetric, atraumatic. TMs normal, nose congested, sinuses no TTP. Throat not red Lungs:  Large airway congestion and few rhonchi, they clear with cough. Slightly increased expiratory time but no wheezing. Normal respiratory effort, no intercostal retractions, no accessory muscle use. Heart: RRR,  no murmur.  No pretibial edema bilaterally  Skin: Not pale. Not jaundice Neurologic:  alert & oriented X3.  Speech normal, gait appropriate for age and unassisted Psych--  Cognition and judgment appear intact.  Cooperative with normal attention span and concentration.  Behavior appropriate. No anxious or depressed appearing.      Assessment & Plan:   Assessment > DM HTN Hyperlipidemia -- intolerant to lipitor and crestor (severe cramps) as off 01-2016  Anxiety- xanax rx by pcp GI: --Recurrent diverticulitis (one episode documented by a CT 11-2014) --Last colonoscopy 10-2013, Dr. Marina Goodell, + polyps, melanosis coli, tics --H. pylori 2001 DJD--back pain, knees, neck pain ( on baclofen for neck) Osteoporosis  SCC, skin cancer Elevated homocysteine NEURO --Carotid US 10-2013: No significant plaque, although ICA's are serpentine, bilaterally. 40-59% bilateral ICA stenosis by velocity criteria likely due to tortuosity. Patent vertebral arteries with antegrade flow.Normal subclavian arteries, bilaterally. -- Transinet dizziness, diplopia sx late 2016 ,  saw neuro >>> Dx w/ TIA/Stroke on clinical grounds  W/u:  Brain  MRI 03-2015 >> 2 mm saccular aneurysm at the right posterior communicating artery origin, saw  Neurology, Rx to recheck MRI 1 year, ok continue ASA per neuro CTA head, neck 05-14-2015: aneurysm confirmed, otherwise (-) --Admitted 07-2015, dizziness, MRI negative, DX labyrinthitis    PLAN: Bronchitis:  Dx w/  Bronchitis 3 days ago, not back to baseline but has make some progress. Finishing antibiotics today or tomorrow. To be sure will get a chest  x-ray otherwise I recommend to continue with same treatment and give it more time. For cough control, she is taking robitussin (w/o "DM")  recommend to take Mucinex DM or similar, add Tessalon.

## 2016-05-23 ENCOUNTER — Ambulatory Visit (INDEPENDENT_AMBULATORY_CARE_PROVIDER_SITE_OTHER): Payer: Medicare Other | Admitting: Internal Medicine

## 2016-05-23 ENCOUNTER — Encounter: Payer: Self-pay | Admitting: Internal Medicine

## 2016-05-23 VITALS — BP 126/66 | HR 73 | Temp 97.8°F | Resp 14 | Ht 62.0 in | Wt 145.5 lb

## 2016-05-23 DIAGNOSIS — E119 Type 2 diabetes mellitus without complications: Secondary | ICD-10-CM

## 2016-05-23 DIAGNOSIS — E7211 Homocystinuria: Secondary | ICD-10-CM | POA: Diagnosis not present

## 2016-05-23 DIAGNOSIS — J40 Bronchitis, not specified as acute or chronic: Secondary | ICD-10-CM

## 2016-05-23 DIAGNOSIS — D72829 Elevated white blood cell count, unspecified: Secondary | ICD-10-CM

## 2016-05-23 DIAGNOSIS — R7989 Other specified abnormal findings of blood chemistry: Secondary | ICD-10-CM

## 2016-05-23 LAB — CBC WITH DIFFERENTIAL/PLATELET
Basophils Absolute: 0.2 10*3/uL — ABNORMAL HIGH (ref 0.0–0.1)
Basophils Relative: 1.9 % (ref 0.0–3.0)
Eosinophils Absolute: 1.9 10*3/uL — ABNORMAL HIGH (ref 0.0–0.7)
Eosinophils Relative: 19.5 % — ABNORMAL HIGH (ref 0.0–5.0)
HCT: 38.8 % (ref 36.0–46.0)
Hemoglobin: 13.2 g/dL (ref 12.0–15.0)
Lymphocytes Relative: 30.5 % (ref 12.0–46.0)
Lymphs Abs: 3 10*3/uL (ref 0.7–4.0)
MCHC: 33.9 g/dL (ref 30.0–36.0)
MCV: 91.5 fl (ref 78.0–100.0)
Monocytes Absolute: 1 10*3/uL (ref 0.1–1.0)
Monocytes Relative: 10.1 % (ref 3.0–12.0)
Neutro Abs: 3.7 10*3/uL (ref 1.4–7.7)
Neutrophils Relative %: 38 % — ABNORMAL LOW (ref 43.0–77.0)
Platelets: 389 10*3/uL (ref 150.0–400.0)
RBC: 4.24 Mil/uL (ref 3.87–5.11)
RDW: 12.3 % (ref 11.5–15.5)
WBC: 9.8 10*3/uL (ref 4.0–10.5)

## 2016-05-23 LAB — HEMOGLOBIN A1C: Hgb A1c MFr Bld: 6.8 % — ABNORMAL HIGH (ref 4.6–6.5)

## 2016-05-23 NOTE — Assessment & Plan Note (Signed)
Bronchitis : Gradually resolving, still has some cough and fatigue. Recommend observation for now. Diverticulitis: Last CBC showed a slightly increased WBC, she is now asx, recheck a CBC. Has an appointment to see GI. DM: Currently on metformin and glyburide, check A1c Increased homocysteine: On folic acid, check levels. RTC 4-5 months, CPX

## 2016-05-23 NOTE — Progress Notes (Signed)
Pre visit review using our clinic review tool, if applicable. No additional management support is needed unless otherwise documented below in the visit note. 

## 2016-05-23 NOTE — Patient Instructions (Signed)
GO TO THE LAB : Get the blood work     GO TO THE FRONT DESK Schedule your next appointment for a  checkup in 4-5 months   

## 2016-05-23 NOTE — Progress Notes (Signed)
Subjective:    Patient ID: Patricia Moore, female    DOB: 1934-04-12, 83 y.o.   MRN: [id number]  DOS:  05/23/2016 Type of visit - description : check up Interval history: Bronchitis: Since the last time he is gradually recovering but is not completely well. Still having some cough and fatigue in the afternoon History of homocystine elevation, request a recheck. Last white count is slightly elevated, due for a recheck.   Review of Systems  Denies fever. Still has some nasal discharge. No abdominal pain No sputum production with cough.  Past Medical History:  Diagnosis Date  . Allergy   . Anxiety 12/09/2013  . Arthritis   . Back pain   . Diabetes mellitus   . Diverticulitis    patient states has had three attacks   . Diverticulosis   . DJD (degenerative joint disease) 12/14/2014  . H. pylori infection 2001  . Hx of adenomatous colonic polyps   . Hyperlipidemia   . Hypertension   . Intraductal papilloma    bx nef x 2 in the 70s-90s  . Osteoporosis   . Saccular aneurysm   . SCC (squamous cell carcinoma) 03/2011   sees dermatology    Past Surgical History:  Procedure Laterality Date  . BREAST BIOPSY  '91, '95   INTRADUCTAL PAPILLOMA  . BREAST LUMPECTOMY    . CATARACT EXTRACTION Left 09-2014    Social History   Social History  . Marital status: Married    Spouse name: N/A  . Number of children: 1  . Years of education: N/A   Occupational History  . retired  Futures trader   Social History Main Topics  . Smoking status: Never Smoker  . Smokeless tobacco: Never Used  . Alcohol use 0.6 oz/week    1 Glasses of wine per week     Comment: OCCASIONALLY  . Drug use: No  . Sexual activity: No   Other Topics Concern  . Not on file   Social History Narrative   Born in [place], ,moved to the [place] in the 60s, live in [place] then in [place]   Lives w/ husband   H.S. graduate      Allergies as of 05/23/2016   No Known Allergies     Medication List       Accurate as of 05/23/16   1:02 PM. Always use your most recent med list.          ALPRAZolam 0.25 MG tablet Commonly known as:  XANAX Take 1 tablet (0.25 mg total) by mouth 3 (three) times daily as needed.   aspirin 81 MG tablet Take 81 mg by mouth daily.   baclofen 10 MG tablet Commonly known as:  LIORESAL Take 0.5 to 1 tablet at bedtime   cholecalciferol 1000 units tablet Commonly known as:  VITAMIN D Take 1,000 Units by mouth daily.   co-enzyme Q-10 30 MG capsule Take 30 mg by mouth daily.   diazepam 5 MG tablet Commonly known as:  VALIUM Take 1 tablet (5 mg total) by mouth every 6 (six) hours as needed for anxiety.   dorzolamide-timolol 22.3-6.8 MG/ML ophthalmic solution Commonly known as:  COSOPT   folic acid 1 MG tablet Commonly known as:  FOLVITE Take 1 tablet (1 mg total) by mouth 4 (four) times daily.   glucose blood test strip Commonly known as:  ONE TOUCH ULTRA TEST Check blood sugar no more than twice daily   glyBURIDE 2.5 MG tablet Commonly known as:  DIABETA Take 1 tablet (2.5 mg total) by mouth 2 (two) times daily with a meal.   losartan-hydrochlorothiazide 100-12.5 MG tablet Commonly known as:  HYZAAR Take 1 tablet by mouth daily.   meclizine 25 MG tablet Commonly known as:  ANTIVERT Take 1 tablet (25 mg total) by mouth 3 (three) times daily as needed for dizziness.   metFORMIN 1000 MG tablet Commonly known as:  GLUCOPHAGE Take 1 tablet by mouth with breakfast, 1/2 tablet by mouth with lunch, and 1/2 tablet by mouth with dinner   onetouch ultrasoft lancets Check blood sugar no more than twice daily.   ranitidine 300 MG tablet Commonly known as:  ZANTAC Take 1 tablet (300 mg total) by mouth at bedtime.          Objective:   Physical Exam BP 126/66 (BP Location: Left Arm, Patient Position: Sitting, Cuff Size: Normal)   Pulse 73   Temp 97.8 F (36.6 C) (Oral)   Resp 14   Ht 5\' 2"  (1.575 m)   Wt 145 lb 8 oz (66 kg)   LMP 04/17/1988   SpO2 97%   BMI 26.61  kg/m  General:   Well developed, well nourished . NAD.  HEENT:  Normocephalic . Face symmetric, atraumatica slightly congested Lungs:  CTA B Normal respiratory effort, no intercostal retractions, no accessory muscle use. Heart: RRR,  no murmur.  No pretibial edema bilaterally  Skin: Not pale. Not jaundice Neurologic:  alert & oriented X3.  Speech normal, gait appropriate for age and unassisted Psych--  Cognition and judgment appear intact.  Cooperative with normal attention span and concentration.  Behavior appropriate. No anxious or depressed appearing.      Assessment & Plan:  Assessment > DM HTN Hyperlipidemia -- intolerant to lipitor and crestor (severe cramps) as off 01-2016  Anxiety- xanax rx by pcp Vertigo - valium prn GI: --Recurrent diverticulitis (one episode documented by a CT 11-2014) --Last colonoscopy 10-2013, Dr. Marina Goodell, + polyps, melanosis coli, tics --H. pylori 2001 DJD--back pain, knees, neck pain ( on baclofen for neck) Osteoporosis  SCC, skin cancer Elevated homocysteine NEURO --Carotid US 10-2013: No significant plaque, although ICA's are serpentine, bilaterally. 40-59% bilateral ICA stenosis by velocity criteria likely due to tortuosity. Patent vertebral arteries with antegrade flow.Normal subclavian arteries, bilaterally. 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 Recommend observation for now. Diverticulitis: Last CBC showed a slightly increased WBC, she is now asx, recheck a CBC. Has an appointment to see GI. DM: Currently on  metformin and glyburide, check A1c Increased homocysteine: On folic acid, check levels. RTC 4-5 months, CPX

## 2016-05-25 LAB — HOMOCYSTEINE: Homocysteine: 14.1 umol/L (ref 0.0–15.0)

## 2016-05-26 ENCOUNTER — Ambulatory Visit (INDEPENDENT_AMBULATORY_CARE_PROVIDER_SITE_OTHER): Payer: Medicare Other | Admitting: Internal Medicine

## 2016-05-26 ENCOUNTER — Encounter: Payer: Self-pay | Admitting: Internal Medicine

## 2016-05-26 VITALS — BP 138/78 | HR 78 | Ht 62.0 in | Wt 148.0 lb

## 2016-05-26 DIAGNOSIS — K5732 Diverticulitis of large intestine without perforation or abscess without bleeding: Secondary | ICD-10-CM | POA: Diagnosis not present

## 2016-05-26 MED ORDER — METRONIDAZOLE 500 MG PO TABS: 500.0000 mg | ORAL_TABLET | Freq: Two times a day (BID) | ORAL | 1 refills | Status: DC

## 2016-05-26 MED ORDER — CIPROFLOXACIN HCL 500 MG PO TABS: 500.0000 mg | ORAL_TABLET | Freq: Two times a day (BID) | ORAL | 1 refills | Status: DC

## 2016-05-26 NOTE — Patient Instructions (Signed)
We have sent the following medications to your pharmacy for you to pick up at your convenience:  Flagyl, Cipro  Increase fiber in your diet  I have given you some information on diverticulitis  Please follow up as needed

## 2016-05-26 NOTE — Progress Notes (Signed)
HISTORY OF PRESENT ILLNESS:  Patricia Moore is a 83 y.o. female with past medical history as listed below who is sent by her primary care provider Dr. Larose Kells regarding recurrent problems with diverticulitis. Patient has been seen in this office for multiple colonoscopies. Most recent exams 2015 with diminutive adenomas and hyperplastic polyp. No follow-up recommended given her age. She does have moderate left-sided diverticulosis. She did have a documented case of diverticulitis clinically and by CT scan August 2015. Over the past year she has had 5 bouts of diverticulitis requiring treatment with ciprofloxacin and metronidazole. Typically she responds within 3-5 days. Typically ten-day course. No recurrent imaging. Last episode in late December/ early January. Blood work at that time revealed leukocytosis. Normal hemoglobin. Other laboratories okay. She has had no problems since. She does subscribe to high-fiber diet. GI review of systems otherwise negative. She does have a history of H. pylori related duodenal night as 2001. Treated with Prevpac.  REVIEW OF SYSTEMS:  All non-GI ROS negative except for back pain, hearing problems, heart murmur, muscle cramps, occasional ankle swelling, urinary frequency  Past Medical History:  Diagnosis Date  . Allergy   . Anxiety 12/09/2013  . Arthritis   . Back pain   . Diabetes mellitus   . Diverticulitis    patient states has had three attacks   . Diverticulosis   . DJD (degenerative joint disease) 12/14/2014  . H. pylori infection 2001  . Hx of adenomatous colonic polyps   . Hyperlipidemia   . Hypertension   . Intraductal papilloma    bx nef x 2 in the 70s-90s  . Osteoporosis   . Saccular aneurysm   . SCC (squamous cell carcinoma) 03/2011   sees dermatology    Past Surgical History:  Procedure Laterality Date  . BREAST BIOPSY  '91, '95   INTRADUCTAL PAPILLOMA  . BREAST LUMPECTOMY    . CATARACT EXTRACTION Left 09-2014    Social History Lateesha AISLING KLEPPINGER  reports that she has never smoked. She has never used smokeless tobacco. She reports that she drinks about 0.6 oz of alcohol per week . She reports that she does not use drugs.  family history includes Breast cancer in her maternal aunt and maternal aunt; Cancer in her sister; Colon cancer in her maternal grandfather and paternal uncle; Heart disease (age of onset: 39) in her mother; Heart disease (age of onset: 67) in her father; Stroke in her maternal aunt. She was adopted.  No Known Allergies     PHYSICAL EXAMINATION: Vital signs: BP 138/78   Pulse 78   Ht 5\' 2"  (1.575 m)   Wt 148 lb (67.1 kg)   LMP 04/17/1988   BMI 27.07 kg/m   Constitutional: generally well-appearing, no acute distress Psychiatric: alert and oriented x3, cooperative Eyes: extraocular movements intact, anicteric, conjunctiva pink Mouth: oral pharynx moist, no lesions Neck: supple no lymphadenopathy Cardiovascular: heart regular rate and rhythm, no murmur Lungs: clear to auscultation bilaterally Abdomen: soft, nontender, nondistended, no obvious ascites, no peritoneal signs, normal bowel sounds, no organomegaly Rectal:Omitted Extremities: no clubbing cyanosis or lower extremity edema bilaterally Skin: no lesions on visible extremities Neuro: No focal deficits. Cranial nerves intact. No asterixis.    ASSESSMENT:  #1. Recurrent uncomplicated diverticulitis. Currently asymptomatic #2. History of adenomatous colon polyps. Aged out of surveillance #3. Gen. medical problems stable   PLAN:  #1. Discussion of diverticulitis. Literature provided #2. High-fiber diet #3. As patient has concerns about being away from [place] or  even out of the country and possibly having a flare of her disease, I have prescribed her a ten-day course of ciprofloxacin 500 mg twice daily and metronidazole 500 mg twice daily with one refill. She could use this in an urgent situation if needed. I have asked her to contact this  office though to report the use for documentation purposes. She was grateful. She will follow-up as needed and resume general medical care with Dr. Larose Kells.  25 minutes was spent face-to-face with the patient. Greater than 50% a time issues for counseling regarding her diverticular disease

## 2016-06-02 ENCOUNTER — Other Ambulatory Visit: Payer: Self-pay | Admitting: *Deleted

## 2016-06-02 DIAGNOSIS — R42 Dizziness and giddiness: Secondary | ICD-10-CM

## 2016-06-06 DIAGNOSIS — H4089 Other specified glaucoma: Secondary | ICD-10-CM | POA: Diagnosis not present

## 2016-06-06 DIAGNOSIS — H5710 Ocular pain, unspecified eye: Secondary | ICD-10-CM | POA: Diagnosis not present

## 2016-06-06 DIAGNOSIS — H401121 Primary open-angle glaucoma, left eye, mild stage: Secondary | ICD-10-CM | POA: Diagnosis not present

## 2016-06-06 DIAGNOSIS — H25811 Combined forms of age-related cataract, right eye: Secondary | ICD-10-CM | POA: Diagnosis not present

## 2016-06-06 LAB — HM DIABETES EYE EXAM

## 2016-06-09 ENCOUNTER — Encounter: Payer: Self-pay | Admitting: Internal Medicine

## 2016-06-13 ENCOUNTER — Ambulatory Visit
Admission: RE | Admit: 2016-06-13 | Discharge: 2016-06-13 | Disposition: A | Discharge status: Home / Self Care | Payer: Medicare Other | Source: Ambulatory Visit | Attending: Neurology | Admitting: Neurology

## 2016-06-13 ENCOUNTER — Telehealth: Payer: Self-pay | Admitting: Neurology

## 2016-06-13 DIAGNOSIS — R42 Dizziness and giddiness: Secondary | ICD-10-CM

## 2016-06-13 DIAGNOSIS — I671 Cerebral aneurysm, nonruptured: Secondary | ICD-10-CM | POA: Diagnosis not present

## 2016-06-13 NOTE — Telephone Encounter (Signed)
-----   Message from Pieter Partridge, DO sent at 06/13/2016  1:11 PM EST ----- MRA of head shows that the tiny aneurysm is unchanged, which is good.

## 2016-06-21 DIAGNOSIS — H25811 Combined forms of age-related cataract, right eye: Secondary | ICD-10-CM | POA: Diagnosis not present

## 2016-06-23 ENCOUNTER — Ambulatory Visit (INDEPENDENT_AMBULATORY_CARE_PROVIDER_SITE_OTHER): Payer: Medicare Other | Admitting: Internal Medicine

## 2016-06-23 ENCOUNTER — Encounter: Payer: Self-pay | Admitting: Internal Medicine

## 2016-06-23 VITALS — BP 126/78 | HR 67 | Temp 98.0°F | Resp 14 | Ht 62.0 in | Wt 143.4 lb

## 2016-06-23 DIAGNOSIS — K148 Other diseases of tongue: Secondary | ICD-10-CM | POA: Diagnosis not present

## 2016-06-23 DIAGNOSIS — Z09 Encounter for follow-up examination after completed treatment for conditions other than malignant neoplasm: Secondary | ICD-10-CM

## 2016-06-23 NOTE — Progress Notes (Signed)
Pre visit review using our clinic review tool, if applicable. No additional management support is needed unless otherwise documented below in the visit note. 

## 2016-06-23 NOTE — Progress Notes (Signed)
Subjective:    Patient ID: Patricia Moore, female    DOB: 1933-10-16, 83 y.o.   MRN: [id number]  DOS:  06/23/2016 Type of visit - description : Acute Interval history: 2 days ago went to see her ophthalmologist in preparation to cataract surgery, he noted her tongue to be purple. She ate strawberries that morning and was not concerned about it. Here today because the area is still different, at this point is brownish in color. She does not recall this being a chronic issue, tongue was essentially normal before she was made aware of the color by her ophthalmologist. She is concerned she may have a "circulation problem". Also, sometimes the tip of her fingers gets really red/purple temporarily.  Review of Systems  Denies any pain swelling, burning. Not taking any new medications Lips normal. Past Medical History:  Diagnosis Date  . Allergy   . Anxiety 12/09/2013  . Arthritis   . Back pain   . Diabetes mellitus   . Diverticulitis    patient states has had three attacks   . Diverticulosis   . DJD (degenerative joint disease) 12/14/2014  . H. pylori infection 2001  . Hx of adenomatous colonic polyps   . Hyperlipidemia   . Hypertension   . Intraductal papilloma    bx nef x 2 in the 70s-90s  . Osteoporosis   . Saccular aneurysm   . SCC (squamous cell carcinoma) 03/2011   sees dermatology    Past Surgical History:  Procedure Laterality Date  . BREAST BIOPSY  '91, '95   INTRADUCTAL PAPILLOMA  . BREAST LUMPECTOMY    . CATARACT EXTRACTION Left 09-2014    Social History   Social History  . Marital status: Married    Spouse name: N/A  . Number of children: 1  . Years of education: N/A   Occupational History  . retired  Futures trader   Social History Main Topics  . Smoking status: Never Smoker  . Smokeless tobacco: Never Used  . Alcohol use 0.6 oz/week    1 Glasses of wine per week     Comment: OCCASIONALLY  . Drug use: No  . Sexual activity: No   Other Topics Concern    . Not on file   Social History Narrative   Born in [place], ,moved to the [place] in the 60s, live in [place] then in [place]   Lives w/ husband   H.S. graduate      Allergies as of 06/23/2016   No Known Allergies     Medication List       Accurate as of 06/23/16  1:15 PM. Always use your most recent med list.          ALPRAZolam 0.25 MG tablet Commonly known as:  XANAX Take 1 tablet (0.25 mg total) by mouth 3 (three) times daily as needed.   aspirin 81 MG tablet Take 81 mg by mouth daily.   baclofen 10 MG tablet Commonly known as:  LIORESAL Take 0.5 to 1 tablet at bedtime   cholecalciferol 1000 units tablet Commonly known as:  VITAMIN D Take 1,000 Units by mouth daily.   ciprofloxacin 500 MG tablet Commonly known as:  CIPRO Take 1 tablet (500 mg total) by mouth 2 (two) times daily.   co-enzyme Q-10 30 MG capsule Take 30 mg by mouth daily.   dorzolamide-timolol 22.3-6.8 MG/ML ophthalmic solution Commonly known as:  COSOPT   folic acid 1 MG tablet Commonly known as:  FOLVITE Take 1 tablet (  1 mg total) by mouth 4 (four) times daily.   glucose blood test strip Commonly known as:  ONE TOUCH ULTRA TEST Check blood sugar no more than twice daily   glyBURIDE 2.5 MG tablet Commonly known as:  DIABETA Take 1 tablet (2.5 mg total) by mouth 2 (two) times daily with a meal.   losartan-hydrochlorothiazide 100-12.5 MG tablet Commonly known as:  HYZAAR Take 1 tablet by mouth daily.   meclizine 25 MG tablet Commonly known as:  ANTIVERT Take 1 tablet (25 mg total) by mouth 3 (three) times daily as needed for dizziness.   metFORMIN 1000 MG tablet Commonly known as:  GLUCOPHAGE Take 1 tablet by mouth with breakfast, 1/2 tablet by mouth with lunch, and 1/2 tablet by mouth with dinner   metroNIDAZOLE 500 MG tablet Commonly known as:  FLAGYL Take 1 tablet (500 mg total) by mouth 2 (two) times daily.   onetouch ultrasoft lancets Check blood sugar no more than twice daily.    ranitidine 300 MG tablet Commonly known as:  ZANTAC Take 1 tablet (300 mg total) by mouth at bedtime.          Objective:   Physical Exam BP 126/78 (BP Location: Left Arm, Patient Position: Sitting, Cuff Size: Normal)   Pulse 67   Temp 98 F (36.7 C) (Oral)   Resp 14   Ht 5\' 2"  (1.575 m)   Wt 143 lb 6 oz (65 kg)   LMP 04/17/1988   SpO2 96%   BMI 26.22 kg/m  General:   Well developed, well nourished . NAD.  HEENT:  Normocephalic . Face symmetric, atraumatic. Tongue: indeed has areas on the dorsum R>L  brownish in color where the papilla are slightly more prominent. No ulcerations, white patches. On palpation, the tongue is soft  without indurations. The oral cavity normal. Lips normal.  Neck: No LADs  Skin: Not pale. Not jaundice Neurologic:  alert & oriented X3.  Speech normal, gait appropriate for age and unassisted Psych--  Cognition and judgment appear intact.  Cooperative with normal attention span and concentration.  Behavior appropriate. No anxious or depressed appearing.      Assessment & Plan:   Assessment > DM HTN Hyperlipidemia -- intolerant to lipitor and crestor (severe cramps) as off 01-2016  Anxiety- xanax rx by pcp Vertigo - valium prn GI: --Recurrent diverticulitis (one episode documented by a CT 11-2014) --Last colonoscopy 10-2013, Dr. Marina Goodell, + polyps, melanosis coli, tics --H. pylori 2001 DJD--back pain, knees, neck pain ( on baclofen for neck) Osteoporosis  SCC, skin cancer Elevated homocysteine NEURO --Carotid US 10-2013: No significant plaque, although ICA's are serpentine, bilaterally. 40-59% bilateral ICA stenosis by velocity criteria likely due to tortuosity. Patent vertebral arteries with antegrade flow.Normal subclavian arteries, bilaterally. -- Transinet dizziness, diplopia sx late 2016 , saw neuro >>> Dx w/ TIA/Stroke on clinical grounds  W/u:  Brain  MRI 03-2015 >> 2 mm saccular aneurysm at the right posterior communicating artery  origin, saw  Neurology, Rx to recheck MRI 1 year, ok continue ASA per neuro CTA head, neck 05-14-2015: aneurysm confirmed, otherwise (-) --Admitted 07-2015, dizziness, MRI negative, DX labyrinthitis    PLAN: Abnormal tongue exam: Doubt serious pathology,likely hairy tongue, rx observation for now, will recheck when she comes back. If  not gradually better she is to let me know. Raynaud phenomena? Fingertips change color sometimes, rec observation

## 2016-06-25 NOTE — Assessment & Plan Note (Signed)
Abnormal tongue exam: Doubt serious pathology,likely hairy tongue, rx observation for now, will recheck when she comes back. If  not gradually better she is to let me know. Raynaud phenomena? Fingertips change color sometimes, rec observation

## 2016-06-27 ENCOUNTER — Other Ambulatory Visit: Payer: Self-pay | Admitting: Internal Medicine

## 2016-06-30 DIAGNOSIS — J029 Acute pharyngitis, unspecified: Secondary | ICD-10-CM | POA: Diagnosis not present

## 2016-07-11 DIAGNOSIS — E119 Type 2 diabetes mellitus without complications: Secondary | ICD-10-CM | POA: Diagnosis not present

## 2016-07-11 DIAGNOSIS — I10 Essential (primary) hypertension: Secondary | ICD-10-CM | POA: Diagnosis not present

## 2016-07-11 DIAGNOSIS — M1990 Unspecified osteoarthritis, unspecified site: Secondary | ICD-10-CM | POA: Diagnosis not present

## 2016-07-11 DIAGNOSIS — Z9842 Cataract extraction status, left eye: Secondary | ICD-10-CM | POA: Diagnosis not present

## 2016-07-11 DIAGNOSIS — H4089 Other specified glaucoma: Secondary | ICD-10-CM | POA: Diagnosis not present

## 2016-07-11 DIAGNOSIS — H25811 Combined forms of age-related cataract, right eye: Secondary | ICD-10-CM | POA: Diagnosis not present

## 2016-07-11 DIAGNOSIS — Z7984 Long term (current) use of oral hypoglycemic drugs: Secondary | ICD-10-CM | POA: Diagnosis not present

## 2016-07-11 DIAGNOSIS — Z961 Presence of intraocular lens: Secondary | ICD-10-CM | POA: Diagnosis not present

## 2016-07-12 DIAGNOSIS — Z961 Presence of intraocular lens: Secondary | ICD-10-CM | POA: Diagnosis not present

## 2016-07-12 DIAGNOSIS — H401121 Primary open-angle glaucoma, left eye, mild stage: Secondary | ICD-10-CM | POA: Diagnosis not present

## 2016-07-12 DIAGNOSIS — H5310 Unspecified subjective visual disturbances: Secondary | ICD-10-CM | POA: Diagnosis not present

## 2016-07-12 DIAGNOSIS — H4089 Other specified glaucoma: Secondary | ICD-10-CM | POA: Diagnosis not present

## 2016-07-19 DIAGNOSIS — H4089 Other specified glaucoma: Secondary | ICD-10-CM | POA: Diagnosis not present

## 2016-07-19 DIAGNOSIS — H401121 Primary open-angle glaucoma, left eye, mild stage: Secondary | ICD-10-CM | POA: Diagnosis not present

## 2016-07-19 DIAGNOSIS — H5310 Unspecified subjective visual disturbances: Secondary | ICD-10-CM | POA: Diagnosis not present

## 2016-07-19 DIAGNOSIS — Z961 Presence of intraocular lens: Secondary | ICD-10-CM | POA: Diagnosis not present

## 2016-07-20 ENCOUNTER — Ambulatory Visit (INDEPENDENT_AMBULATORY_CARE_PROVIDER_SITE_OTHER): Payer: Medicare Other | Admitting: Medical

## 2016-07-20 VITALS — BP 134/55 | HR 69 | Temp 97.9°F | Ht 62.0 in | Wt 139.4 lb

## 2016-07-20 DIAGNOSIS — R21 Rash and other nonspecific skin eruption: Secondary | ICD-10-CM

## 2016-07-20 MED ORDER — CLOTRIMAZOLE-BETAMETHASONE 1-0.05 % EX CREA: 1.0000 "application " | TOPICAL_CREAM | Freq: Two times a day (BID) | CUTANEOUS | 0 refills | Status: DC

## 2016-07-20 MED ORDER — CEPHALEXIN 500 MG PO CAPS: 500.0000 mg | ORAL_CAPSULE | Freq: Two times a day (BID) | ORAL | 0 refills | Status: DC

## 2016-07-20 NOTE — Progress Notes (Signed)
Subjective:    Patient ID: Patricia Moore, female    DOB: [birth year], 83 y.o.   MRN: [id number]  HPI  Pt in with rash in left area of her left axillary area for one week. No pain but does itch. Pt tried triple antibiotic and did not help. Also tried calamine lotion which helped some. No fever, no dc. No reported insect bite  Pt uses baking soda to axilla.no use deoderant.  Rt axillary normal. Pt never had this before.  No fever, no chills, or sweats. No skin trauma known. No insect bites.   Review of Systems  Constitutional: Negative for chills, fatigue and fever.  Respiratory: Negative for cough, choking, shortness of breath and wheezing.   Cardiovascular: Negative for chest pain and palpitations.  Gastrointestinal: Negative for abdominal pain, nausea and vomiting.  Skin: Positive for rash.  Neurological: Negative for dizziness, seizures, speech difficulty, weakness, light-headedness, numbness and headaches.  Hematological: Negative for adenopathy. Does not bruise/bleed easily.  Psychiatric/Behavioral: Negative for confusion and decreased concentration.    Past Medical History:  Diagnosis Date  . Allergy   . Anxiety 12/09/2013  . Arthritis   . Back pain   . Diabetes mellitus   . Diverticulitis    patient states has had three attacks   . Diverticulosis   . DJD (degenerative joint disease) 12/14/2014  . H. pylori infection 2001  . Hx of adenomatous colonic polyps   . Hyperlipidemia   . Hypertension   . Intraductal papilloma    bx nef x 2 in the 70s-90s  . Osteoporosis   . Saccular aneurysm   . SCC (squamous cell carcinoma) 03/2011   sees dermatology     Social History   Social History  . Marital status: Married    Spouse name: N/A  . Number of children: 1  . Years of education: N/A   Occupational History  . retired  Futures trader   Social History Main Topics  . Smoking status: Never Smoker  . Smokeless tobacco: Never Used  . Alcohol use 0.6 oz/week    1  Glasses of wine per week     Comment: OCCASIONALLY  . Drug use: No  . Sexual activity: No   Other Topics Concern  . Not on file   Social History Narrative   Born in [place], ,moved to the [place] in the 60s, live in [place] then in [place]   Lives w/ husband   H.S. graduate    Past Surgical History:  Procedure Laterality Date  . BREAST BIOPSY  '91, '95   INTRADUCTAL PAPILLOMA  . BREAST LUMPECTOMY    . CATARACT EXTRACTION Left 09-2014    Family History  Problem Relation Age of Onset  . Adopted: Yes  . Heart disease Mother 47  . Heart disease Father 21  . Breast cancer Maternal Aunt   . Stroke Maternal Aunt   . Colon cancer Paternal Uncle     7 uncles  . Colon cancer Maternal Grandfather     COLON,, FAMILY HX 7 MEMBERS  . Breast cancer Maternal Aunt   . Cancer Sister     skin  . Hypertension Neg Hx   . Diabetes Neg Hx     No Known Allergies  Current Outpatient Prescriptions on File Prior to Visit  Medication Sig Dispense Refill  . ALPRAZolam (XANAX) 0.25 MG tablet Take 1 tablet (0.25 mg total) by mouth 3 (three) times daily as needed. 30 tablet 5  . aspirin 81 MG tablet  Take 81 mg by mouth daily.    . cholecalciferol (VITAMIN D) 1000 UNITS tablet Take 1,000 Units by mouth daily.    Marland Kitchen co-enzyme Q-10 30 MG capsule Take 30 mg by mouth daily.    . dorzolamide-timolol (COSOPT) 22.3-6.8 MG/ML ophthalmic solution     . folic acid (FOLVITE) 1 MG tablet Take 1 tablet (1 mg total) by mouth 4 (four) times daily. 120 tablet 12  . glucose blood (ONE TOUCH ULTRA TEST) test strip Check blood sugar no more than twice daily 100 each 12  . glyBURIDE (DIABETA) 2.5 MG tablet Take 1 tablet (2.5 mg total) by mouth 2 (two) times daily with a meal. 60 tablet 5  . Lancets (ONETOUCH ULTRASOFT) lancets Check blood sugar no more than twice daily. 100 each 12  . losartan-hydrochlorothiazide (HYZAAR) 100-12.5 MG tablet Take 1 tablet by mouth daily. 30 tablet 5  . metFORMIN (GLUCOPHAGE) 1000 MG tablet Take 1  tablet by mouth with breakfast, 1/2 tablet by mouth with lunch, and 1/2 tablet by mouth with dinner 60 tablet 5  . baclofen (LIORESAL) 10 MG tablet Take 0.5 to 1 tablet at bedtime (Patient not taking: Reported on 07/20/2016) 30 each 5  . meclizine (ANTIVERT) 25 MG tablet Take 1 tablet (25 mg total) by mouth 3 (three) times daily as needed for dizziness. (Patient not taking: Reported on 07/20/2016) 30 tablet 1  . ranitidine (ZANTAC) 300 MG tablet Take 1 tablet (300 mg total) by mouth at bedtime. (Patient not taking: Reported on 07/20/2016) 30 tablet 6   No current facility-administered medications on file prior to visit.     BP (!) 134/55   Pulse 69   Temp 97.9 F (36.6 C) (Oral)   Ht 5\' 2"  (1.575 m)   Wt 139 lb 6.4 oz (63.2 kg)   LMP 04/17/1988   SpO2 100%   BMI 25.50 kg/m       Objective:   Physical Exam  General- No acute distress. Pleasant patient. Neck- Full range of motion, no jvd Lungs- Clear, even and unlabored. Heart- regular rate and rhythm. Neurologic- CNII- XII grossly intact.  Left axillary area- 2 cm area of pink skin. Mild raised. No warm to touch. No vesicles seen. No induration. No break down. No dc. No follicles inflammed. And no lymph nodes palpable.  Digits on hand- normal color presently. Warm. Good capillary refill      Assessment & Plan:  For your rash in axillary area I will rx lotrisone to apply twice daily. If the area turns red, warm, tender then start oral keflex/antibiotic.  Follow up in 6 days or as needed.

## 2016-07-20 NOTE — Progress Notes (Signed)
Pre visit review using our clinic tool,if applicable. No additional management support is needed unless otherwise documented below in the visit note.  

## 2016-07-20 NOTE — Patient Instructions (Addendum)
For your rash in axillary area I will rx lotrisone to apply twice daily. If the area turns red, warm, tender then start oral keflex/antibiotic.  Follow up in 6 days or as needed.  Regarding your digit concerns you described. I want you to review below information and discuss with Dr. Larose Kells on next visit.(pt at very end mentioned finger tips at time feel transiently cold finger tips  and white appearance. Occurs on and off. Not present know. She mentioned discussed with pcp in the past. Wanted my opinion what it could be. I gave below info so she could review and discuss with her pcp on next visit.  Fenmeno de Raynaud (Raynaud Phenomenon) El fenmeno de Raynaud es una enfermedad que afecta los vasos sanguneos (arterias) que transportan la sangre a los dedos de las manos y de los pies. Las arterias que [employer] orejas o la punta de la nariz tambin pueden estar afectadas. El fenmeno de Raynaud causa el estrechamiento temporal de las arterias. Como consecuencia, la irrigacin de sangre a las zonas afectadas disminuye transitoriamente. Esto suele ocurrir en respuesta a las bajas temperaturas o el estrs. Durante una crisis, la piel de las zonas afectadas se torna blanca. Adems, se puede sentir hormigueo o adormecimiento en esas zonas. Generalmente, las crisis [place], y [employer] la irrigacin de sangre a la zona se Education administrator. En la [employer], el fenmeno de Raynaud no causa problemas graves de [place]. CAUSAS En muchas personas con esta enfermedad, la causa se desconoce. A veces, el fenmeno de Raynaud est relacionado con otras enfermedades, como esclerodermia o lupus. [place] Torres fenmeno de Raynaud puede afectar a cualquier persona, pero se presenta con ms frecuencia en aquellas que tienen entre 20 y 47aos. Afecta ms a las mujeres que a los hombres. [place] sntomas de fenmeno de Raynaud pueden manifestarse cuando una persona se expone a bajas  temperaturas o sufre estrs emocional. Pueden durar unos minutos o varias horas. Por lo general, afecta los dedos de las manos, pero tambin pueden [employer] dedos de los pies, las orejas o la punta de la nariz. Entre los sntomas se pueden incluir los siguientes:  Cambios en el color de la piel. La piel de las zonas afectadas se tornar plida o blanca. Luego, puede pasar de blanca a azulada a roja, a medida que se restablece la irrigacin normal de sangre a la zona.  Adormecimiento, hormigueo o dolor en las zonas afectadas. En los casos graves, pueden aparecer llagas en estas zonas. DIAGNSTICO El mdico har un examen fsico y Ardelia Mems historia clnica. Pueden indicarle que ponga las manos en agua fra para determinar si hay una reaccin a la baja temperatura. Tal vez le realicen anlisis de sangre para detectar si hay otras enfermedades o afecciones. Adems, el mdico puede pedir un estudio para controlar el movimiento de la sangre a travs de las arterias y las venas (ecografa vascular). TRATAMIENTO El tratamiento suele conllevar cambios en el estilo de vida y medidas para controlar la exposicin a las bajas temperaturas. En los casos ms graves, pueden administrarse medicamentos (antagonistas del calcio) para Engineer, production sangunea. A veces, se realiza [place] para bloquear los nervios que controlan las arterias [place], pero esto es poco frecuente. INSTRUCCIONES PARA EL CUIDADO EN EL HOGAR  Para evitar la exposicin al fro, siga estos pasos:  Si es posible, qudese adentro cuando haga fro.  Cuando salga durante la poca de clima fro, vstase con varias  capas de ropa y use mitones, un gorro, Ardelia Mems bufanda y zapatos abrigados.  Use mitones o guantes cuando manipule hielo o comida congelada.  Use portavasos o portalatas para los vasos o las latas que contengan bebidas fras.  Deje correr el agua caliente durante un rato antes de tomar una ducha o un bao.  Caliente el  automvil antes de [employer].  Si es posible, evite las situaciones emotivas y estresantes. El ejercicio, la meditacin y el yoga pueden ayudarlo a Scientist, research (medical). La biorregulacin puede ser til.  No consuma ningn producto que contenga tabaco, lo que incluye cigarrillos, tabaco de Higher education careers adviser o Psychologist, sport and exercise. Si necesita ayuda para dejar de fumar, consulte al [employer].  Evite ser un fumador pasivo.  Limite el consumo de cafena. En cambio, tome caf, t y gaseosas descafeinadas. Evite el chocolate.  Use medias sueltas y zapatos cmodos y amplios.  No use herramientas ni maquinaria que vibren.  Tome los medicamentos solamente como se lo haya indicado el mdico. SOLICITE ATENCIN MDICA SI:  Las molestias empeoran a pesar de los cambios en el estilo de [place].  Le aparecen llagas en los dedos de las manos o de los pies que no se cicatrizan.  [place] los pies se tornan negros.  Tiene estras en la piel de los dedos de las manos o de los pies.  Tiene fiebre.  Tiene dolor o hinchazn en las articulaciones.  Tiene una erupcin cutnea.  Los sntomas aparecen en un solo lado del cuerpo. Esta informacin no tiene Marine scientist el consejo del mdico. Asegrese de hacerle al mdico cualquier pregunta que tenga. Document Released: 01/11/2005 Document Revised: 04/24/2014 Document Reviewed: 10/06/2015 Elsevier Interactive Patient Education  2017 [employer].

## 2016-07-25 ENCOUNTER — Other Ambulatory Visit: Payer: Self-pay | Admitting: Internal Medicine

## 2016-07-26 ENCOUNTER — Ambulatory Visit: Payer: Medicare Other | Admitting: Medical

## 2016-08-09 DIAGNOSIS — H401121 Primary open-angle glaucoma, left eye, mild stage: Secondary | ICD-10-CM | POA: Diagnosis not present

## 2016-08-09 DIAGNOSIS — H4089 Other specified glaucoma: Secondary | ICD-10-CM | POA: Diagnosis not present

## 2016-08-09 DIAGNOSIS — H527 Unspecified disorder of refraction: Secondary | ICD-10-CM | POA: Diagnosis not present

## 2016-08-09 DIAGNOSIS — Z961 Presence of intraocular lens: Secondary | ICD-10-CM | POA: Diagnosis not present

## 2016-08-16 ENCOUNTER — Encounter: Payer: Self-pay | Admitting: Gynecology

## 2016-08-16 DIAGNOSIS — Z1231 Encounter for screening mammogram for malignant neoplasm of breast: Secondary | ICD-10-CM | POA: Diagnosis not present

## 2016-08-29 ENCOUNTER — Other Ambulatory Visit: Payer: Self-pay | Admitting: Internal Medicine

## 2016-08-29 ENCOUNTER — Ambulatory Visit (INDEPENDENT_AMBULATORY_CARE_PROVIDER_SITE_OTHER): Payer: Medicare Other | Admitting: Neurology

## 2016-08-29 ENCOUNTER — Encounter: Payer: Self-pay | Admitting: Neurology

## 2016-08-29 VITALS — BP 138/74 | HR 70 | Ht 62.0 in | Wt 141.0 lb

## 2016-08-29 DIAGNOSIS — R42 Dizziness and giddiness: Secondary | ICD-10-CM | POA: Diagnosis not present

## 2016-08-29 DIAGNOSIS — M542 Cervicalgia: Secondary | ICD-10-CM

## 2016-08-29 DIAGNOSIS — I671 Cerebral aneurysm, nonruptured: Secondary | ICD-10-CM

## 2016-08-29 DIAGNOSIS — Q283 Other malformations of cerebral vessels: Secondary | ICD-10-CM | POA: Diagnosis not present

## 2016-08-29 NOTE — Patient Instructions (Signed)
I am happy you are doing well.  Follow up as needed.

## 2016-08-29 NOTE — Progress Notes (Signed)
NEUROLOGY FOLLOW UP OFFICE NOTE  Patricia Moore [id number]  HISTORY OF PRESENT ILLNESS: Patricia Moore is an 83 year old right-handed female with hypertension, type 2 diabetes mellitus, hyperlipidemia, DJD and arthritis who follows up for dizziness and cervical neck pain.   UPDATE: Vertigo has resolved.  She still has neck pain sometimes, but very mild (2/10).  She has baclofen if it should get worse, but she hasn't needed it.  She is feeling well.  To follow up pcomm aneurysm, a repeat MRA of head was performed on 06/13/16, which was personally reviewed and again demonstrated a 36mm outpouching at the right posterior communicating artery, which may be an aneurysm or potentially could represent infundibulum.   HISTORY: Since June 2016, she has been experiencing severe right-sided posterior neck pain that radiates down to the right shoulder.  It does not radiate into the arm.  She denies numbness or tingling of the right upper extremity or back of the head.  It is painful with neck movement.  Applying pressure is helpful.  Pain is worse later in the day.  Over the past couple of months, she had two spells of dizziness.  She woke up from sleep and noted double vision and severe spinning lasting a couple of minutes.  She just closed her eyes and was still until it resolved.  She denied slurred speech, focal numbness or weakness or headache.  She woke up on her back but reports that she turns side to side when she sleeps.  It happened one other time.  Since then, she still feels unsteady when she walks.  She has mild dizziness when she bends over.  She denies prior history of similar spells.  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 To evaluate neck pain, X-ray of cervical spine performed on 02/15/15 showed moderate multilevel degenerative disc and facet joint disease with straightening of the cervical lordosis, indicative of muscle spasm.   She has had episodes of dizziness.   She woke up from sleep and noted double vision and severe spinning lasting a couple of minutes.  She just closed her eyes and was still until it resolved.  She denied slurred speech, focal numbness or weakness or headache.  She woke up on her back but reports that she turns side to side when she sleeps.  It happened one other time.  Since then, she still feels unsteady when she walks.  She has mild dizziness when she bends over.  She denies prior history of similar spells.  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 To evaluate neck pain, X-ray of cervical spine performed on 02/15/15 showed moderate multilevel degenerative disc and facet joint disease with straightening of the cervical lordosis, indicative of muscle spasm.  MRA of head from 04/05/15 demonstrated no correlating findings to the cerebellar legion seen on prior MRI.  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  She was evaluated by neurosurgery who did not feel that the pcomm aneurysm or cavernoma required intervention.  Neck pain has since resolved.   On the morning of 05/02/15, she woke up and felt dizzy.  She also reported horizontal double vision.  Her left eye was blood-red.  She reported slight headache but no slurred speech, gait instability or focal numbness or weakness. Diplopia lasted one to two days.  She saw the eye doctor who told her she did have "blood in the back" of her  eye, perhaps having slept on it.    CT head and CTA of head and neck from 05/14/15 revealed stable small 2-3 mm right Pcomm.  She was referred to endovascular surgery for evaluation.  She was evaluated by neurosurgery who did not feel that the pcomm aneurysm or cavernoma required intervention.  Due to possible posterior circulation TIA (vertigo with diplopia), she was started on ASA 81mg  daily for secondary stroke prevention She presented to [facility] for another episode of persistent vertigo, nausea and vomiting.   CT of head showed no acute findings.  MRI of brain was normal.  She was given low-dose steroids for possible labyrnthitis and hydration.  She followed up with her ENT who concurred that she had an inner ear dysfunction.  She was prescribed Valium and meclizine.  PAST MEDICAL HISTORY: Past Medical History:  Diagnosis Date  . Allergy   . Anxiety 12/09/2013  . Arthritis   . Back pain   . Diabetes mellitus   . Diverticulitis    patient states has had three attacks   . Diverticulosis   . DJD (degenerative joint disease) 12/14/2014  . H. pylori infection 2001  . Hx of adenomatous colonic polyps   . Hyperlipidemia   . Hypertension   . Intraductal papilloma    bx nef x 2 in the 70s-90s  . Osteoporosis   . Saccular aneurysm   . SCC (squamous cell carcinoma) 03/2011   sees dermatology    MEDICATIONS: Current Outpatient Prescriptions on File Prior to Visit  Medication Sig Dispense Refill  . aspirin 81 MG tablet Take 81 mg by mouth daily.    . baclofen (LIORESAL) 10 MG tablet Take 0.5 to 1 tablet at bedtime (Patient not taking: Reported on 07/20/2016) 30 each 5  . cephALEXin (KEFLEX) 500 MG capsule Take 1 capsule (500 mg total) by mouth 2 (two) times daily. 20 capsule 0  . cholecalciferol (VITAMIN D) 1000 UNITS tablet Take 1,000 Units by mouth daily.    . clotrimazole-betamethasone (LOTRISONE) cream Apply 1 application topically 2 (two) times daily. 30 g 0  . co-enzyme Q-10 30 MG capsule Take 30 mg by mouth daily.    . dorzolamide-timolol (COSOPT) 22.3-6.8 MG/ML ophthalmic solution     . folic acid (FOLVITE) 1 MG tablet Take 1 tablet (1 mg total) by mouth 4 (four) times daily. 120 tablet 12  . glucose blood (ONE TOUCH ULTRA TEST) test strip Check blood sugar no more than twice daily 100 each 12  . glyBURIDE (DIABETA) 2.5 MG tablet Take 1 tablet (2.5 mg total) by mouth 2 (two) times daily with a meal. 60 tablet 5  . Lancets (ONETOUCH ULTRASOFT) lancets Check blood sugar no more than twice  daily. 100 each 12  . losartan-hydrochlorothiazide (HYZAAR) 100-12.5 MG tablet Take 1 tablet by mouth daily. 30 tablet 5  . meclizine (ANTIVERT) 25 MG tablet Take 1 tablet (25 mg total) by mouth 3 (three) times daily as needed for dizziness. (Patient not taking: Reported on 07/20/2016) 30 tablet 1  . metFORMIN (GLUCOPHAGE) 1000 MG tablet TAKE 1 TABLET BY MOUTH WITH BREAKFAST, 1/2 TABLET WITH LUNCH, AND 1/2 TABLET WITH DINNER DAILY 60 tablet 5  . ranitidine (ZANTAC) 300 MG tablet Take 1 tablet (300 mg total) by mouth at bedtime. (Patient not taking: Reported on 07/20/2016) 30 tablet 6   No current facility-administered medications on file prior to visit.     ALLERGIES: No Known Allergies  FAMILY HISTORY: Family History  Problem Relation  Age of Onset  . Adopted: Yes  . Heart disease Mother 33  . Heart disease Father 30  . Breast cancer Maternal Aunt   . Stroke Maternal Aunt   . Colon cancer Paternal Uncle        7 uncles  . Colon cancer Maternal Grandfather        COLON,, FAMILY HX 7 MEMBERS  . Breast cancer Maternal Aunt   . Cancer Sister        skin  . Hypertension Neg Hx   . Diabetes Neg Hx     SOCIAL HISTORY: Social History   Social History  . Marital status: Married    Spouse name: N/A  . Number of children: 1  . Years of education: N/A   Occupational History  . retired  Agricultural engineer   Social History Main Topics  . Smoking status: Never Smoker  . Smokeless tobacco: Never Used  . Alcohol use 0.6 oz/week    1 Glasses of wine per week     Comment: OCCASIONALLY  . Drug use: No  . Sexual activity: No   Other Topics Concern  . Not on file   Social History Narrative   Born in [place], ,moved to the [place] in the 60s, live in [place] then in [place] w/ husband   H.S. graduate    REVIEW OF SYSTEMS: Constitutional: No fevers, chills, or sweats, no generalized fatigue, change in appetite Eyes: No visual changes, double vision, eye pain Ear, nose and throat: No hearing loss,  ear pain, nasal congestion, sore throat Cardiovascular: No chest pain, palpitations Respiratory:  No shortness of breath at rest or with exertion, wheezes GastrointestinaI: No nausea, vomiting, diarrhea, abdominal pain, fecal incontinence Genitourinary:  No dysuria, urinary retention or frequency Musculoskeletal:  No neck pain, back pain Integumentary: No rash, pruritus, skin lesions Neurological: as above Psychiatric: No depression, insomnia, anxiety Endocrine: No palpitations, fatigue, diaphoresis, mood swings, change in appetite, change in weight, increased thirst Hematologic/Lymphatic:  No purpura, petechiae. Allergic/Immunologic: no itchy/runny eyes, nasal congestion, recent allergic reactions, rashes  PHYSICAL EXAM: Vitals:   08/29/16 1120  BP: 138/74  Pulse: 70   General: No acute distress.  Patient appears well-groomed.  normal body habitus. Head:  Normocephalic/atraumatic Eyes:  Fundi examined but not visualized Neck: supple, no paraspinal tenderness, full range of motion Heart:  Regular rate and rhythm Lungs:  Clear to auscultation bilaterally Back: No paraspinal tenderness Neurological Exam: alert and oriented to person, place, and time. Attention span and concentration intact, recent and remote memory intact, fund of knowledge intact.  Speech fluent and not dysarthric, language intact.  CN II-XII intact. Bulk and tone normal, muscle strength 5/5 throughout.  Sensation to light touch  intact.  Deep tendon reflexes 2+ throughout, toes downgoing.  Finger to nose testing intact.  Gait normal, Romberg with mild sway.  IMPRESSION & PLAN: 1. Positional vertigo, resolved 2.  Cervical neck pain (musculoskeletal) 3.  Cerebral aneurysm, clinically stable.   May consider repeat CTA or MRA of head in 2 years.  Follow up as needed.  15 minutes spent face to face with patient, over 50% spent discussing management.  Metta Clines, DO  CC: Kathlene November, MD

## 2016-08-29 NOTE — Telephone Encounter (Signed)
Rx printed, awaiting MD signature.  

## 2016-08-29 NOTE — Telephone Encounter (Signed)
Rx faxed to [facility]  

## 2016-08-29 NOTE — Telephone Encounter (Signed)
Okay #30 and 5 refills 

## 2016-08-29 NOTE — Telephone Encounter (Signed)
Pt is requesting refill on Alprazolam.  Last OV: 06/23/2016 Last Fill: 02/07/2016 #30 and 5RF UDS: 12/01/2015 Low risk  Please advise.

## 2016-08-30 ENCOUNTER — Encounter: Payer: Self-pay | Admitting: Gynecology

## 2016-09-05 ENCOUNTER — Ambulatory Visit (INDEPENDENT_AMBULATORY_CARE_PROVIDER_SITE_OTHER): Payer: Medicare Other | Admitting: Gynecology

## 2016-09-05 ENCOUNTER — Encounter: Payer: Self-pay | Admitting: Gynecology

## 2016-09-05 VITALS — BP 122/78 | Ht 61.0 in | Wt 140.0 lb

## 2016-09-05 DIAGNOSIS — E559 Vitamin D deficiency, unspecified: Secondary | ICD-10-CM | POA: Diagnosis not present

## 2016-09-05 DIAGNOSIS — Z01419 Encounter for gynecological examination (general) (routine) without abnormal findings: Secondary | ICD-10-CM

## 2016-09-05 DIAGNOSIS — D251 Intramural leiomyoma of uterus: Secondary | ICD-10-CM | POA: Diagnosis not present

## 2016-09-05 NOTE — Progress Notes (Signed)
Patricia Moore [birth year] [id number]   History:    83 y.o.  for annual gyn exam with no major complaints today.Patient with past history of small fibroid uterus whereby an ultrasound in 2017 demonstrated following: Uterus measured 9.6 x 8.9 x 5.1 cm with an endometrial stripe of 2.8 mm. Patient with 4 calcified fibroids the largest one measuring 42 x 36 mm. Right and left ovary were atrophic. No significant change from previous ultrasound one year ago. Patient has had history of vaginal atrophy not sexually active currently not using estrogen replacement therapy. She has had history vitamin D deficiency in the past which we'll check her vitamin D level today. Patient was strong family history of colon cancer and had colon polyp removed in 2015. Patient with no past history of abnormal Pap smears. Patient's immunizations are up-to-date.   Patient's last bone density study was August 2017. Lowest T score was at the AP spine with a value of -1.9 right and left femoral neck -1.1 with a normal Frax analysis  Past medical history,surgical history, family history and social history were all reviewed and documented in the EPIC chart.  Gynecologic History Patient's last menstrual period was 04/17/1988. Contraception: post menopausal status Last Pap: 2012. Results were: normal Last mammogram: 2018. Results were: Normal but dense had three-dimensional mammogram  Obstetric History OB History  Gravida Para Term Preterm AB Living  2 1     1 1   SAB TAB Ectopic Multiple Live Births  1            # Outcome Date GA Lbr Len/2nd Weight Sex Delivery Anes PTL Lv  2 SAB           1 Para                ROS: A ROS was performed and pertinent positives and negatives are included in the history.  GENERAL: No fevers or chills. HEENT: No change in vision, no earache, sore throat or sinus congestion. NECK: No pain or stiffness. CARDIOVASCULAR: No chest pain or pressure. No palpitations. PULMONARY: No  shortness of breath, cough or wheeze. GASTROINTESTINAL: No abdominal pain, nausea, vomiting or diarrhea, melena or bright red blood per rectum. GENITOURINARY: No urinary frequency, urgency, hesitancy or dysuria. MUSCULOSKELETAL: No joint or muscle pain, no back pain, no recent trauma. DERMATOLOGIC: No rash, no itching, no lesions. ENDOCRINE: No polyuria, polydipsia, no heat or cold intolerance. No recent change in weight. HEMATOLOGICAL: No anemia or easy bruising or bleeding. NEUROLOGIC: No headache, seizures, numbness, tingling or weakness. PSYCHIATRIC: No depression, no loss of interest in normal activity or change in sleep pattern.     Exam: chaperone present  BP 122/78   Ht 5\' 1"  (1.549 m)   Wt 140 lb (63.5 kg)   LMP 04/17/1988   BMI 26.45 kg/m   Body mass index is 26.45 kg/m.  General appearance : Well developed well nourished female. No acute distress HEENT: Eyes: no retinal hemorrhage or exudates,  Neck supple, trachea midline, no carotid bruits, no thyroidmegaly Lungs: Clear to auscultation, no rhonchi or wheezes, or rib retractions  Heart: Regular rate and rhythm, no murmurs or gallops Breast:Examined in sitting and supine position were symmetrical in appearance, no palpable masses or tenderness,  no skin retraction, no nipple inversion, no nipple discharge, no skin discoloration, no axillary or supraclavicular lymphadenopathy Abdomen: no palpable masses or tenderness, no rebound or guarding Extremities: no edema or skin discoloration or tenderness  Pelvic:  Bartholin, Urethra, Skene  Glands: Within normal limits             Vagina: No gross lesions or discharge, atrophic changes  Cervix: No gross lesions or discharge  Uterus  axial, normal size, shape and consistency, non-tender and mobile  Adnexa  Without masses or tenderness  Anus and perineum  normal   Rectovaginal  normal sphincter tone without palpated masses or tenderness             Hemoccult PCP provides      Assessment/Plan:  83 y.o. female for annual exam with history of osteopenia and vitamin D deficiency. Patient will need a bone density study next year. We will check a vitamin D level today. Because her history of fibroid uterus we will have an ultrasound next week to make sure that they're continue to diminish in size and to compare with previous scan. We discussed importance of calcium vitamin D and weightbearing exercises for osteoporosis prevention. Pap smear not indicated.   Ok Edwards MD, 11:06 AM 09/05/2016

## 2016-09-06 DIAGNOSIS — D2272 Melanocytic nevi of left lower limb, including hip: Secondary | ICD-10-CM | POA: Diagnosis not present

## 2016-09-06 DIAGNOSIS — Z85828 Personal history of other malignant neoplasm of skin: Secondary | ICD-10-CM | POA: Diagnosis not present

## 2016-09-06 DIAGNOSIS — L821 Other seborrheic keratosis: Secondary | ICD-10-CM | POA: Diagnosis not present

## 2016-09-06 DIAGNOSIS — D2271 Melanocytic nevi of right lower limb, including hip: Secondary | ICD-10-CM | POA: Diagnosis not present

## 2016-09-06 LAB — VITAMIN D 25 HYDROXY (VIT D DEFICIENCY, FRACTURES): Vit D, 25-Hydroxy: 37 ng/mL (ref 30–100)

## 2016-09-20 ENCOUNTER — Encounter: Payer: Self-pay | Admitting: Internal Medicine

## 2016-09-20 ENCOUNTER — Ambulatory Visit (INDEPENDENT_AMBULATORY_CARE_PROVIDER_SITE_OTHER): Payer: Medicare Other | Admitting: Internal Medicine

## 2016-09-20 VITALS — BP 118/58 | HR 75 | Temp 97.5°F | Resp 14 | Ht 61.0 in | Wt 140.0 lb

## 2016-09-20 DIAGNOSIS — E119 Type 2 diabetes mellitus without complications: Secondary | ICD-10-CM

## 2016-09-20 DIAGNOSIS — R079 Chest pain, unspecified: Secondary | ICD-10-CM

## 2016-09-20 DIAGNOSIS — E785 Hyperlipidemia, unspecified: Secondary | ICD-10-CM

## 2016-09-20 DIAGNOSIS — R252 Cramp and spasm: Secondary | ICD-10-CM

## 2016-09-20 LAB — CBC WITH DIFFERENTIAL/PLATELET
Basophils Absolute: 0.2 10*3/uL — ABNORMAL HIGH (ref 0.0–0.1)
Basophils Relative: 2.1 % (ref 0.0–3.0)
Eosinophils Absolute: 1.3 10*3/uL — ABNORMAL HIGH (ref 0.0–0.7)
Eosinophils Relative: 13.5 % — ABNORMAL HIGH (ref 0.0–5.0)
HCT: 40.9 % (ref 36.0–46.0)
Hemoglobin: 14.1 g/dL (ref 12.0–15.0)
Lymphocytes Relative: 31 % (ref 12.0–46.0)
Lymphs Abs: 3.1 10*3/uL (ref 0.7–4.0)
MCHC: 34.4 g/dL (ref 30.0–36.0)
MCV: 91.9 fl (ref 78.0–100.0)
Monocytes Absolute: 0.7 10*3/uL (ref 0.1–1.0)
Monocytes Relative: 7.3 % (ref 3.0–12.0)
Neutro Abs: 4.6 10*3/uL (ref 1.4–7.7)
Neutrophils Relative %: 46.1 % (ref 43.0–77.0)
Platelets: 356 10*3/uL (ref 150.0–400.0)
RBC: 4.45 Mil/uL (ref 3.87–5.11)
RDW: 12.6 % (ref 11.5–15.5)
WBC: 10 10*3/uL (ref 4.0–10.5)

## 2016-09-20 LAB — BASIC METABOLIC PANEL
BUN: 26 mg/dL — ABNORMAL HIGH (ref 6–23)
CO2: 32 mEq/L (ref 19–32)
Calcium: 10.8 mg/dL — ABNORMAL HIGH (ref 8.4–10.5)
Chloride: 101 mEq/L (ref 96–112)
Creatinine, Ser: 0.98 mg/dL (ref 0.40–1.20)
GFR: 57.94 mL/min — ABNORMAL LOW (ref >60.00)
Glucose, Bld: 124 mg/dL — ABNORMAL HIGH (ref 70–99)
Potassium: 5.3 mEq/L — ABNORMAL HIGH (ref 3.5–5.1)
Sodium: 136 mEq/L (ref 135–145)

## 2016-09-20 LAB — MAGNESIUM: Magnesium: 2.2 mg/dL (ref 1.5–2.5)

## 2016-09-20 LAB — HEMOGLOBIN A1C: Hgb A1c MFr Bld: 7.6 % — ABNORMAL HIGH (ref 4.6–6.5)

## 2016-09-20 NOTE — Patient Instructions (Signed)
GO TO THE LAB : Get the blood work     GO TO THE FRONT DESK Schedule your next appointment for a  Check up in 3 months  

## 2016-09-20 NOTE — Progress Notes (Signed)
Subjective:    Patient ID: Patricia Moore, female    DOB: 10/01/1933, 83 y.o.   MRN: [id number]  DOS:  09/20/2016 Type of visit - description : Routine visit. Interval history: General feeling well, she does have some concerns: Had 2 episodes of severe generalized leg cramps, the first one was several months ago, the second one 4 weeks ago. During the second episode she was walking at [facility] and at the time she reported chest pain. The pain is described as a anterior pressure, no radiation, associated with mild tiredness and dizziness. There was no diaphoresis or nausea. Some palpitation. Symptoms lasted 5 minutes. No further episodes.  DM: Good med compliance, ambulatory CBGs 140 HTN: Good med compliance, ambulatory BPs when checked normal.    Review of Systems No GERD symptoms, no abdominal pain, no claudication. Occasional cough, on and off.   Past Medical History:  Diagnosis Date  . Allergy   . Anxiety 12/09/2013  . Arthritis   . Back pain   . Diabetes mellitus   . Diverticulitis    patient states has had three attacks   . Diverticulosis   . DJD (degenerative joint disease) 12/14/2014  . H. pylori infection 2001  . Hx of adenomatous colonic polyps   . Hyperlipidemia   . Hypertension   . Intraductal papilloma    bx nef x 2 in the 70s-90s  . Osteoporosis   . Saccular aneurysm   . SCC (squamous cell carcinoma) 03/2011   sees dermatology    Past Surgical History:  Procedure Laterality Date  . BREAST BIOPSY  '91, '95   INTRADUCTAL PAPILLOMA  . BREAST LUMPECTOMY    . CATARACT EXTRACTION Bilateral 09/2014    Social History   Social History  . Marital status: Married    Spouse name: N/A  . Number of children: 1  . Years of education: N/A   Occupational History  . retired  Futures trader   Social History Main Topics  . Smoking status: Never Smoker  . Smokeless tobacco: Never Used  . Alcohol use 0.6 oz/week    1 Glasses of wine per week     Comment:  OCCASIONALLY  . Drug use: No  . Sexual activity: No   Other Topics Concern  . Not on file   Social History Narrative   Born in [place], ,moved to the [place] in the 60s, live in [place] then in [place]   Lives w/ husband   H.S. graduate      Allergies as of 09/20/2016   No Known Allergies     Medication List       Accurate as of 09/20/16 11:59 PM. Always use your most recent med list.          ALPRAZolam 0.25 MG tablet Commonly known as:  XANAX Take 1 tablet (0.25 mg total) by mouth 3 (three) times daily as needed.   aspirin 81 MG tablet Take 81 mg by mouth daily.   baclofen 10 MG tablet Commonly known as:  LIORESAL Take 0.5 to 1 tablet at bedtime   cholecalciferol 1000 units tablet Commonly known as:  VITAMIN D Take 1,000 Units by mouth daily.   co-enzyme Q-10 30 MG capsule Take 30 mg by mouth daily.   dorzolamide-timolol 22.3-6.8 MG/ML ophthalmic solution Commonly known as:  COSOPT   folic acid 1 MG tablet Commonly known as:  FOLVITE Take 1 tablet (1 mg total) by mouth 4 (four) times daily.   glucose blood test strip Commonly  known as:  ONE TOUCH ULTRA TEST Check blood sugar no more than twice daily   glyBURIDE 2.5 MG tablet Commonly known as:  DIABETA Take 1 tablet (2.5 mg total) by mouth 2 (two) times daily with a meal.   losartan-hydrochlorothiazide 100-12.5 MG tablet Commonly known as:  HYZAAR Take 1 tablet by mouth daily.   metFORMIN 1000 MG tablet Commonly known as:  GLUCOPHAGE TAKE 1 TABLET BY MOUTH WITH BREAKFAST, 1/2 TABLET WITH LUNCH, AND 1/2 TABLET WITH DINNER DAILY   onetouch ultrasoft lancets Check blood sugar no more than twice daily.          Objective:   Physical Exam BP (!) 118/58 (BP Location: Left Arm, Patient Position: Sitting, Cuff Size: Small)   Pulse 75   Temp 97.5 F (36.4 C) (Oral)   Resp 14   Ht 5\' 1"  (1.549 m)   Wt 140 lb (63.5 kg)   LMP 04/17/1988   SpO2 97%   BMI 26.45 kg/m  General:   Well developed, well nourished .  NAD.  HEENT:  Normocephalic . Face symmetric, atraumatic Lungs:  CTA B Normal respiratory effort, no intercostal retractions, no accessory muscle use. Heart: RRR,  no murmur.  No pretibial edema bilaterally  Skin: Not pale. Not jaundice Neurologic:  alert & oriented X3.  Speech normal, gait appropriate for age and unassisted Psych--  Cognition and judgment appear intact.  Cooperative with normal attention span and concentration.  Behavior appropriate. No anxious or depressed appearing.      Assessment & Plan:    Assessment > DM HTN Hyperlipidemia -- intolerant to lipitor and crestor (severe cramps) as off 01-2016  Anxiety- xanax rx by pcp Vertigo - valium prn GI: --Recurrent diverticulitis (one episode documented by a CT 11-2014) --colonoscopy 10-2013, Dr. Marina Goodell, + polyps, melanosis coli, tics --H. pylori 2001 DJD--back pain, knees, neck pain ( on baclofen for neck) Osteoporosis  SCC, skin cancer Elevated homocysteine NEURO --Carotid US 10-2013: No significant plaque, although ICA's are serpentine, bilaterally. 40-59% bilateral ICA stenosis by velocity criteria likely due to tortuosity. Patent vertebral arteries with antegrade flow.Normal subclavian arteries, bilaterally. -- Transinet dizziness, diplopia sx late 2016 , saw neuro >>> Dx w/ TIA/Stroke on clinical grounds  W/u:  Brain  MRI 03-2015 >> 2 mm saccular aneurysm at the right posterior communicating artery origin, saw  Neurology, Rx to recheck MRI 1 year, ok continue ASA per neuro CTA head, neck 05-14-2015: aneurysm confirmed, otherwise (-) --Admitted 07-2015, dizziness, MRI negative, DX labyrinthitis    PLAN: DM: on DiaBeta and metformin. Check a BMP, A1c HTN: Seems controlled on Hyzaar. High cholesterol: Diet control, last LDL 76 Chest pain: see HPI, EKG today normal. Had a (-) ETT 2015. She has multiple CV RF, check a CBC, Rx stres test. ER if sx  Leg cramps: Unclear etiology, check a Mg level RTC  3 months

## 2016-09-20 NOTE — Progress Notes (Signed)
Pre visit review using our clinic review tool, if applicable. No additional management support is needed unless otherwise documented below in the visit note. 

## 2016-09-21 ENCOUNTER — Encounter: Payer: Self-pay | Admitting: Gynecology

## 2016-09-21 ENCOUNTER — Ambulatory Visit (INDEPENDENT_AMBULATORY_CARE_PROVIDER_SITE_OTHER): Payer: Medicare Other

## 2016-09-21 ENCOUNTER — Ambulatory Visit (INDEPENDENT_AMBULATORY_CARE_PROVIDER_SITE_OTHER): Payer: Medicare Other | Admitting: Gynecology

## 2016-09-21 VITALS — BP 126/78

## 2016-09-21 DIAGNOSIS — D251 Intramural leiomyoma of uterus: Secondary | ICD-10-CM | POA: Diagnosis not present

## 2016-09-21 DIAGNOSIS — Z8639 Personal history of other endocrine, nutritional and metabolic disease: Secondary | ICD-10-CM | POA: Insufficient documentation

## 2016-09-21 MED ORDER — METFORMIN HCL 1000 MG PO TABS: ORAL_TABLET | ORAL | 5 refills | Status: DC

## 2016-09-21 NOTE — Patient Instructions (Signed)
Fibromas uterinos (Uterine Fibroids) Los fibromas uterinos son masas (tumores) de tejido que pueden desarrollarse en el vientre (tero). Tambin se los conoce como liomiomas. Este tipo de tumor no es canceroso (benigno) y no se disemina a otras partes del cuerpo fuera de la zona plvica, la cual se encuentra entre los huesos de la cadera. En ocasiones, los fibromas pueden crecer en las trompas de Falopio, en el cuello del tero o en las estructuras de soporte (ligamentos) que rodean el tero. Una mujer puede tener uno o ms fibromas. Los fibromas pueden tener diferente tamao y peso, y crecer en distintas partes del tero. Algunos pueden crecer hasta volverse bastante grandes. La mayora no requiere tratamiento mdico. CAUSAS Un fibroma puede desarrollarse cuando una nica clula uterina contina creciendo (se multiplica). La mayora de las clulas del cuerpo humano tienen un mecanismo de control que impide que se multipliquen sin control. SIGNOS Y SNTOMAS Entre los sntomas se pueden incluir los siguientes:  Hemorragias intensas durante la menstruacin.  Prdidas de sangre o hemorragias entre los perodos.  Dolor y opresin en la pelvis.  Problemas de la vejiga, como necesidad de orinar con ms frecuencia (polaquiuria) o necesidad imperiosa de orinar.  Incapacidad para reproducir (infertilidad).  Abortos espontneos. DIAGNSTICO Los fibromas uterinos se diagnostican con un examen fsico. El mdico puede palpar los tumores grumosos durante un examen plvico. Pueden realizarse ecografas y una resonancia magntica para determinar el tamao y la ubicacin de los fibromas, as como la cantidad. TRATAMIENTO El tratamiento puede incluir lo siguiente:  Observacin cautelosa. Esto requiere que el mdico controle el fibroma para saber si crece o se achica. Siga las recomendaciones del mdico respecto de la frecuencia con la que debe realizarse los controles.  Medicamentos hormonales. Pueden  tomarse por va oral o administrarse a travs de un dispositivo intrauterino (DIU).  Ciruga. ? Extirpacin de los fibromas (miomectoma) o del tero (histerectoma). ? Suprimir la irrigacin sangunea a los fibromas (embolizacin de la arteria uterina). Si los fibromas le traen problemas de fertilidad y tiene deseos de quedar embarazada, el mdico puede recomendar su extirpacin. INSTRUCCIONES PARA EL CUIDADO EN EL HOGAR  Concurra a todas las visitas de control como se lo haya indicado el mdico. Esto es importante.  Tome los medicamentos de venta libre y los recetados solamente como se lo haya indicado el mdico. ? Si le recetaron un tratamiento hormonal, tome los medicamentos hormonales exactamente como se lo indicaron.  Consulte al mdico sobre tomar comprimidos de hierro y aumentar la cantidad de verduras de hoja color verde oscuro en la dieta. Estas medidas pueden ayudar a incrementar los niveles de hierro en la sangre, que pueden verse afectados por las hemorragias menstruales intensas.  Preste mucha atencin a la menstruacin e informe al mdico si hay algn cambio, por ejemplo: ? Aumento del flujo de sangre que le exige el uso de ms compresas o tampones que los que utiliza normalmente cada mes. ? Un cambio en la cantidad de das que le dura la menstruacin cada mes. ? Un cambio en los sntomas asociados con la menstruacin, como clicos abdominales o dolor de espalda. SOLICITE ATENCIN MDICA SI:  Tiene dolor plvico, dolor de espalda o clicos abdominales que los medicamentos no pueden controlar.  Observa un aumento del sangrado entre y durante las menstruaciones.  Empapa los tampones o las compresas en el trmino de media hora o menos tiempo.  Se siente mareada, muy cansada o dbil. SOLICITE ATENCIN MDICA DE INMEDIATO SI:  Se desmaya.    El dolor plvico aumenta repentinamente. Esta informacin no tiene como fin reemplazar el consejo del mdico. Asegrese de hacerle al  mdico cualquier pregunta que tenga. Document Released: 04/03/2005 Document Revised: 07/26/2015 Document Reviewed: 09/30/2013 Elsevier Interactive Patient Education  2018 Elsevier Inc.  

## 2016-09-21 NOTE — Assessment & Plan Note (Signed)
DM: on DiaBeta and metformin. Check a BMP, A1c HTN: Seems controlled on Hyzaar. High cholesterol: Diet control, last LDL 76 Chest pain: see HPI, EKG today normal. Had a (-) ETT 2015. She has multiple CV RF, check a CBC, Rx stres test. ER if sx  Leg cramps: Unclear etiology, check a Mg level RTC  3 months

## 2016-09-21 NOTE — Addendum Note (Signed)
Addended byDamita Dunnings on: 09/21/2016 03:07 PM   Modules accepted: Orders

## 2016-09-21 NOTE — Progress Notes (Signed)
   Patient is an 83 year old who presented to the office today for follow-up ultrasound. Patient with past history of fibroid uterus. Ultrasound in 2017 demonstrated following: Uterus measured 9.6 x 8.9 x 5.1 cm with an endometrial stripe of 2.8 mm. Patient with 4 calcified fibroids the largest one measuring 42 x 36 mm. Right and left ovary were atrophic. Stable from previous scan. Patient also has had vitamin D deficiency and was recently treated with vitamin D 50,000 units every week for 12 weeks she did return to the office for vitamin D level which was back to normal and she is currently taking vitamin D3 2000 units daily. Patient was otherwise asymptomatic today. For details of her annual exam please see office note dated 09/05/2016.  Ultrasound today: Uterus measures 7.1 x 5.4 x 4.7 cm endometrial stripe 4.4 mm. Calcified fibroid 39 x 33 mm and 31 x 24 mm. Right and left ovary normal atrophic no fluid in the cul-de-sac.  Assessment/plan: 83 year old patient with decreasing fibroid uterus no longer needs ultrasound unless patient becomes symptomatic. Patient otherwise scheduled for annual exam next year or when necessary.  Greater than 90% time was spent calcium correlating care for this patient. Time of consultation 10 minutes

## 2016-10-10 ENCOUNTER — Telehealth: Payer: Self-pay | Admitting: Internal Medicine

## 2016-10-10 ENCOUNTER — Telehealth: Payer: Self-pay

## 2016-10-10 NOTE — Telephone Encounter (Signed)
Patient walk in today with complaints of low BP, slight headache, slight dizziness, cramping in legs last pm. Patient states she does not currently had SOB nor chest pain.  Patient has not had BP meds for the past 2 days states they make her BP low.  BP today = 135/54 P= 65 O2=100%   After reviewing patients vitals and symptoms Dr. Larose Kells states patients BP ok at this time. Patient to continue taking BP medications and if symptoms worsen or continue go to the ED as soon as possible. Patient agreed Per Dr. Larose Kells patient to be scheduled for OV or will need to go to ED if she continues to have symptoms or symptoms worsen. Patient taken to appointment scheduler to schedule OV.

## 2016-10-10 NOTE — Telephone Encounter (Signed)
Per stress test order notes, stress test was sent to cardiology on 09/20/2016. Jennifer-can you check status on this? Thank you.

## 2016-10-10 NOTE — Telephone Encounter (Signed)
Pt came states bp low for a week. Pt states pharmacist took bp and told her cut pill in half. She did with no resolve. Pt concerned and did not take bp med today. She also says paz was to schedule stress test and pt want update on that.

## 2016-10-10 NOTE — Telephone Encounter (Signed)
appt scheduled for 10/25/16 @10am , mychart message sent

## 2016-10-11 ENCOUNTER — Ambulatory Visit (INDEPENDENT_AMBULATORY_CARE_PROVIDER_SITE_OTHER): Payer: Medicare Other | Admitting: Family Medicine

## 2016-10-11 VITALS — BP 140/80 | HR 70 | Temp 97.8°F | Ht 61.0 in | Wt 139.8 lb

## 2016-10-11 DIAGNOSIS — E875 Hyperkalemia: Secondary | ICD-10-CM

## 2016-10-11 DIAGNOSIS — I10 Essential (primary) hypertension: Secondary | ICD-10-CM

## 2016-10-11 DIAGNOSIS — R001 Bradycardia, unspecified: Secondary | ICD-10-CM | POA: Diagnosis not present

## 2016-10-11 LAB — BASIC METABOLIC PANEL
BUN: 16 mg/dL (ref 6–23)
CO2: 31 mEq/L (ref 19–32)
Calcium: 10.2 mg/dL (ref 8.4–10.5)
Chloride: 99 mEq/L (ref 96–112)
Creatinine, Ser: 0.92 mg/dL (ref 0.40–1.20)
GFR: 62.31 mL/min (ref >60.00)
Glucose, Bld: 164 mg/dL — ABNORMAL HIGH (ref 70–99)
Potassium: 4.2 mEq/L (ref 3.5–5.1)
Sodium: 136 mEq/L (ref 135–145)

## 2016-10-11 NOTE — Progress Notes (Signed)
[facility] [address] [place], [place] [address] [phone] [id number]  Date:  10/11/2016   Name:  Patricia Moore   DOB:  02/15/1934   MRN:  [id number]  PCP:  Wanda Plump, MD    Chief Complaint: Blood pressure concern (c/o alternating between  low and high  blood pressue. pt states that she checked at [facility] and it was low but does not remember the exact number. Pt checked today at home and got 149/ 84 with pulse 59 and second reading 151/99 and pulse 58.)   History of Present Illness:  Patricia Moore is a 83 y.o. very pleasant female patient who presents with the following:  She was here about 3 weeks ago and saw Dr Drue Novel with concern of chest pain among other things.  She has a stress test scheduled for 7/11.  Here today as she had not been feeling well. She has noted that her BP and pulse were too low at home- she went to the pharmacy and they confirmed her low BP and pulse readings.  Pharmacist advised her to decrease her BP med by half- she is on losartan/ hct and cutting her 100/12.5 in half for the last 3 days   She took her BP this am at home- 149/84, 59 Repeat an hour later159/99, 58  She also notes that sometimes her right leg will swell but not always.  Right now it looks fine.  It does not hurt She has not noted any further episodes of CP No palpitations She had an echo in 2017 that showed mild diastolic dysfunction but was OW normal  He K was elevated at last labs- she has decreased her dietary potassium intake (she was drinking a lot of coconut water and has decreased this) BP Readings from Last 3 Encounters:  10/11/16 140/80  09/21/16 126/78  09/20/16 (!) 118/58     Chemistry      Component Value Date/Time   NA 136 09/20/2016 1216   K 5.3 (H) 09/20/2016 1216   CL 101 09/20/2016 1216   CO2 32 09/20/2016 1216   BUN 26 (H) 09/20/2016 1216   CREATININE 0.98 09/20/2016 1216   CREATININE 1.25 (H) 04/14/2016  1711      Component Value Date/Time   CALCIUM 10.8 (H) 09/20/2016 1216   ALKPHOS 77 04/14/2016 1711   AST 28 04/14/2016 1711   ALT 21 04/14/2016 1711   BILITOT 0.3 04/14/2016 1711      Calcium typically normal so will plan to add on PTH if still high today2  Lab Results  Component Value Date   HGBA1C 7.6 (H) 09/20/2016     Patient Active Problem List   Diagnosis Date Noted  . History of vitamin D deficiency 09/21/2016  . Hyperlipidemia 09/16/2015  . Vertigo 07/30/2015  . Central nervous system origin vertigo 07/30/2015  . Aneurysm, cerebral, nonruptured 04/17/2015  . PCP NOTES >>>>>>>>>>>>>>>>>>>> 01/28/2015  . DJD -- knees  12/14/2014  . Combined form of senile cataract 10/01/2014  . Hypercalcemia 06/08/2014  . Leukocytosis 06/08/2014  . Annual physical exam 06/04/2014  . 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 Anxiety 12/09/2013  . Vitamin D deficiency 12/09/2013  . Back muscle spasm 06/05/2012  . Vaginal atrophy 06/05/2012  . Fibroid uterus 06/05/2012  . BARTHOLIN'S CYST, RECURRENT 12/13/2009  . Dyslipidemia 08/04/2009  . Malignant neoplasm of skin of parts of face 05/06/2009  . VARICOSE VEINS LOWER EXTREMITIES  W/OTH COMPS 03/05/2009  . EDEMA 11/02/2008  . ANGIODYSPLASIA-INTESTINE 05/26/2008  . Diverticulosis, ? Diverticulitis 05/25/2008  . DERMATOPHYTOSIS OF NAIL 10/01/2007  . DM II (diabetes mellitus, type II), controlled (HCC) 12/03/2006  . Essential hypertension 12/03/2006  . Allergic rhinitis 12/03/2006  . OSTEOARTHRITIS 12/03/2006  . BACK PAIN 12/03/2006  . Osteoporosis 12/03/2006    Past Medical History:  Diagnosis Date  . Allergy   . Anxiety 12/09/2013  . Arthritis   . Back pain   . Diabetes mellitus   . Diverticulitis    patient states has had three attacks   . Diverticulosis   . DJD (degenerative joint disease) 12/14/2014  . H. pylori infection 2001  . Hx of adenomatous colonic polyps   . Hyperlipidemia   . Hypertension   .  Intraductal papilloma    bx nef x 2 in the 70s-90s  . Osteoporosis   . Saccular aneurysm   . SCC (squamous cell carcinoma) 03/2011   sees dermatology    Past Surgical History:  Procedure Laterality Date  . BREAST BIOPSY  '91, '95   INTRADUCTAL PAPILLOMA  . BREAST LUMPECTOMY    . CATARACT EXTRACTION Bilateral 09/2014    Social History  Substance Use Topics  . Smoking status: Never Smoker  . Smokeless tobacco: Never Used  . Alcohol use 0.6 oz/week    1 Glasses of wine per week     Comment: OCCASIONALLY    Family History  Problem Relation Age of Onset  . Adopted: Yes  . Heart disease Mother 23  . Heart disease Father 43  . Breast cancer Maternal Aunt   . Stroke Maternal Aunt   . Colon cancer Paternal Uncle        7 uncles  . Colon cancer Maternal Grandfather        COLON,, FAMILY HX 7 MEMBERS  . Breast cancer Maternal Aunt   . Cancer Sister        skin  . Hypertension Neg Hx   . Diabetes Neg Hx     No Known Allergies  Medication list has been reviewed and updated.  Current Outpatient Prescriptions on File Prior to Visit  Medication Sig Dispense Refill  . ALPRAZolam (XANAX) 0.25 MG tablet Take 1 tablet (0.25 mg total) by mouth 3 (three) times daily as needed. (Patient taking differently: Take 0.25 mg by mouth daily. ) 30 tablet 5  . aspirin 81 MG tablet Take 81 mg by mouth daily.    . cholecalciferol (VITAMIN D) 1000 UNITS tablet Take 1,000 Units by mouth daily.    Marland Kitchen co-enzyme Q-10 30 MG capsule Take 30 mg by mouth daily.    . dorzolamide-timolol (COSOPT) 22.3-6.8 MG/ML ophthalmic solution     . folic acid (FOLVITE) 1 MG tablet Take 1 tablet (1 mg total) by mouth 4 (four) times daily. 120 tablet 12  . glucose blood (ONE TOUCH ULTRA TEST) test strip Check blood sugar no more than twice daily 100 each 12  . glyBURIDE (DIABETA) 2.5 MG tablet Take 1 tablet (2.5 mg total) by mouth 2 (two) times daily with a meal. 60 tablet 5  . Lancets (ONETOUCH ULTRASOFT) lancets  Check blood sugar no more than twice daily. 100 each 12  . losartan-hydrochlorothiazide (HYZAAR) 100-12.5 MG tablet Take 1 tablet by mouth daily. 30 tablet 5  . metFORMIN (GLUCOPHAGE) 1000 MG tablet Take 1 tablet by mouth with breakfast, 1 tablet with lunch, and 1/2 tablet with dinner daily 75 tablet 5   No current  facility-administered medications on file prior to visit.     Review of Systems:  As per HPI- otherwise negative.  No fever, chills, CP, SOB, nausea, vomiting, rash, ST, cough    Physical Examination: Vitals:   10/11/16 0858 10/11/16 0914  BP: (!) 144/74 140/80  Pulse: 70   Temp: 97.8 F (36.6 C)    Vitals:   10/11/16 0858  Weight: 139 lb 12.8 oz (63.4 kg)  Height: 5\' 1"  (1.549 m)   Body mass index is 26.41 kg/m. Ideal Body Weight: Weight in (lb) to have BMI = 25: 132  GEN: WDWN, NAD, Non-toxic, A & O x 3, looks well HEENT: Atraumatic, Normocephalic. Neck supple. No masses, No LAD. Ears and Nose: No external deformity. CV: RRR, No M/G/R. No JVD. No thrill. No extra heart sounds.  Pulse 66 at my exam PULM: CTA B, no wheezes, crackles, rhonchi. No retractions. No resp. distress. No accessory muscle use. EXTR: No c/c/e.  No LE swelling or tenderness at this time, no cords in calf  NEURO Normal gait.  PSYCH: Normally interactive. Conversant. Not depressed or anxious appearing.  Calm demeanor.    Assessment and Plan: Bradycardia - Plan: Ambulatory referral to Cardiology  Essential hypertension - Plan: Ambulatory referral to Cardiology  Hyperkalemia - Plan: Basic metabolic panel  Here today with concern of bradycardia/ variable pulse and variable BP Advised that her current BP med is not likely to cause bradycardia. She would like to see cardiology which is reasonable- will set this up for her. Also made a plan to adjust her BP meds as needed - See patient instructions for more details.   Will recheck her K and calcium today and followup as needed    Signed Abbe Amsterdam, MD

## 2016-10-11 NOTE — Patient Instructions (Addendum)
It was very nice to see you today.  We will check on your potassium for you and I will let you know how it looks Your particular blood pressure medication will not generally effect your pulse.   Why don't you continue taking a 1/2 tablet in the morning. Then check your BP around lunch- if you are higher than 140/90 you can take the other half of your BP pill.  Plan to go ahead with your stress test and I will also arrange for a cardiology consultation for you.  Take care and please let me know if you are not feeling well again or if you have any other concerns

## 2016-10-23 ENCOUNTER — Telehealth (HOSPITAL_COMMUNITY): Payer: Self-pay | Admitting: *Deleted

## 2016-10-23 NOTE — Telephone Encounter (Signed)
Patient given detailed instructions per Myocardial Perfusion Study Information Sheet for the test on 10/25/16 at 1000. Patient notified to arrive 15 minutes early and that it is imperative to arrive on time for appointment to keep from having the test rescheduled.  If you need to cancel or reschedule your appointment, please call the office within 24 hours of your appointment. . Patient verbalized understanding.Patricia Moore    

## 2016-10-25 ENCOUNTER — Ambulatory Visit (HOSPITAL_COMMUNITY): Payer: Medicare Other | Attending: Cardiovascular Disease

## 2016-10-25 VITALS — Ht 61.0 in | Wt 139.0 lb

## 2016-10-25 DIAGNOSIS — R079 Chest pain, unspecified: Secondary | ICD-10-CM | POA: Insufficient documentation

## 2016-10-25 LAB — MYOCARDIAL PERFUSION IMAGING
LV dias vol: 77 mL (ref 46–106)
LV sys vol: 21 mL
Peak HR: 99 {beats}/min
RATE: 0.29
Rest HR: 69 {beats}/min
SDS: 3
SRS: 4
SSS: 7
TID: 0.92

## 2016-10-25 MED ORDER — TECHNETIUM TC 99M TETROFOSMIN IV KIT
31.1000 | PACK | Freq: Once | INTRAVENOUS | Status: AC | PRN
  Administered 2016-10-25: 31.1 via INTRAVENOUS
  Filled 2016-10-25: qty 32

## 2016-10-25 MED ORDER — REGADENOSON 0.4 MG/5ML IV SOLN
0.4000 mg | Freq: Once | INTRAVENOUS | Status: AC
Start: 2016-10-25 — End: 2016-10-25
  Administered 2016-10-25: 0.4 mg via INTRAVENOUS

## 2016-10-25 MED ORDER — TECHNETIUM TC 99M TETROFOSMIN IV KIT
10.7000 | PACK | Freq: Once | INTRAVENOUS | Status: AC | PRN
  Administered 2016-10-25: 10.7 via INTRAVENOUS
  Filled 2016-10-25: qty 11

## 2016-11-06 ENCOUNTER — Encounter: Payer: Self-pay | Admitting: Cardiology

## 2016-11-06 ENCOUNTER — Ambulatory Visit (INDEPENDENT_AMBULATORY_CARE_PROVIDER_SITE_OTHER): Payer: Medicare Other | Admitting: Cardiology

## 2016-11-06 VITALS — BP 156/66 | HR 67 | Ht 61.5 in | Wt 140.1 lb

## 2016-11-06 DIAGNOSIS — I10 Essential (primary) hypertension: Secondary | ICD-10-CM

## 2016-11-06 DIAGNOSIS — E785 Hyperlipidemia, unspecified: Secondary | ICD-10-CM | POA: Diagnosis not present

## 2016-11-06 NOTE — Patient Instructions (Signed)
Medication Instructions:  Your physician recommends that you continue on your current medications as directed. Please refer to the Current Medication list given to you today.   Labwork: None  Testing/Procedures: Your physician has requested that you have an echocardiogram. Echocardiography is a painless test that uses sound waves to create images of your heart. It provides your doctor with information about the size and shape of your heart and how well your heart's chambers and valves are working. This procedure takes approximately one hour. There are no restrictions for this procedure.  Your physician has recommended that you wear a holter monitor. Holter monitors are medical devices that record the heart's electrical activity. Doctors most often use these monitors to diagnose arrhythmias. Arrhythmias are problems with the speed or rhythm of the heartbeat. The monitor is a small, portable device. You can wear one while you do your normal daily activities. This is usually used to diagnose what is causing palpitations/syncope (passing out).    Follow-Up: Your physician wants you to follow-up in: 3 months. You will receive a reminder letter in the mail two months in advance. If you don't receive a letter, please call our office to schedule the follow-up appointment.   Any Other Special Instructions Will Be Listed Below (If Applicable).     If you need a refill on your cardiac medications before your next appointment, please call your pharmacy.

## 2016-11-06 NOTE — Progress Notes (Signed)
Cardiology Office Note:    Date:  11/06/2016   ID:  Patricia Moore, Park Meo Jan 02, 1934, MRN [id number]  PCP:  Wanda Plump, MD  Cardiologist:  Garwin Brothers, MD   Referring MD: Pearline Cables, MD    ASSESSMENT:    1. Essential hypertension   2. Dyslipidemia    PLAN:    In order of problems listed above:  1. I discussed my findings with the patient and the daughter-in-law extensively. Currently her hemodynamics appeared to be acceptable. Her heart rate is Moore. In view of this I will obtain a TSH and a 24-hour Holter monitoring to evaluate her rhythm on ongoing basis. 2. Her blood pressure stable. She has an element of whitecoat hypertension. Her lipids are followed by her primary care physician and diet was advised. Diet was also discussed for diabetes mellitus. Patient will be seen in follow-up appointment in 3 months or earlier if she has any concerns. She knows to go to the nearest emergency room for any significant concerns. I would also like to get an echocardiogram on her to assess murmur heard on auscultation.   Medication Adjustments/Labs and Tests Ordered: Current medicines are reviewed at length with the patient today.  Concerns regarding medicines are outlined above.  No orders of the defined types were placed in this encounter.  No orders of the defined types were placed in this encounter.    History of Present Illness:    Patricia Moore is a 83 y.o. female who is being seen today for the evaluation of Bradyarrhythmia at the request of Copland, Gwenlyn Found, MD. Patient is a very pleasant 83 year old healthy female. She has past medical history of essential hypertension, dyslipidemia and she has been referred for the possibility of bradyarrhythmias. She also has a intracranial aneurysm followed by her primary care physician. She is not on statin therapy he is a diabetic. She tells me that she cannot tolerate statin therapies and wishes not to be on them. She has tried  multiple statin medications as guided by her primary care provider. She denies any chest pain orthopnea or PND. She walks up to 3 miles without any problems and does that 4-5 times a week.  Past Medical History:  Diagnosis Date  . Allergy   . Anxiety 12/09/2013  . Arthritis   . Back pain   . Diabetes mellitus   . Diverticulitis    patient states has had three attacks   . Diverticulosis   . DJD (degenerative joint disease) 12/14/2014  . H. pylori infection 2001  . Heart murmur   . Hx of adenomatous colonic polyps   . Hyperlipidemia   . Hypertension   . Intraductal papilloma    bx nef x 2 in the 70s-90s  . Osteoporosis   . Saccular aneurysm   . SCC (squamous cell carcinoma) 03/2011   sees dermatology    Past Surgical History:  Procedure Laterality Date  . BREAST BIOPSY  '91, '95   INTRADUCTAL PAPILLOMA  . BREAST LUMPECTOMY    . CATARACT EXTRACTION Bilateral 09/2014    Current Medications: Current Meds  Medication Sig  . ALPRAZolam (XANAX) 0.25 MG tablet Take 1 tablet (0.25 mg total) by mouth 3 (three) times daily as needed. (Patient taking differently: Take 0.25 mg by mouth daily. )  . aspirin 81 MG tablet Take 81 mg by mouth daily.  . baclofen (LIORESAL) 10 MG tablet Take 1 tablet by mouth as needed.  . cholecalciferol (VITAMIN D) 1000  UNITS tablet Take 1,000 Units by mouth daily.  Marland Kitchen co-enzyme Q-10 30 MG capsule Take 30 mg by mouth daily.  . dorzolamide-timolol (COSOPT) 22.3-6.8 MG/ML ophthalmic solution Place 1 drop into both eyes 2 (two) times daily.   Marland Kitchen glucose blood (ONE TOUCH ULTRA TEST) test strip Check blood sugar no more than twice daily  . glyBURIDE (DIABETA) 2.5 MG tablet Take 1 tablet (2.5 mg total) by mouth 2 (two) times daily with a meal.  . Lancets (ONETOUCH ULTRASOFT) lancets Check blood sugar no more than twice daily.  Marland Kitchen losartan-hydrochlorothiazide (HYZAAR) 100-12.5 MG tablet Take 1 tablet by mouth daily.  . metFORMIN (GLUCOPHAGE) 1000 MG tablet Take 1  tablet by mouth with breakfast, 1 tablet with lunch, and 1/2 tablet with dinner daily     Allergies:   Patient has no known allergies.   Social History   Social History  . Marital status: Married    Spouse name: N/A  . Number of children: 1  . Years of education: N/A   Occupational History  . retired  Futures trader   Social History Main Topics  . Smoking status: Never Smoker  . Smokeless tobacco: Never Used  . Alcohol use 0.6 oz/week    1 Glasses of wine per week     Comment: OCCASIONALLY  . Drug use: No  . Sexual activity: No   Other Topics Concern  . None   Social History Narrative   Born in [place], ,moved to the [place] in the 60s, live in [place] then in [place]   Lives w/ husband   H.S. graduate     Family History: The patient's family history includes Breast cancer in her maternal aunt and maternal aunt; Cancer in her sister; Colon cancer in her maternal grandfather and paternal uncle; Heart disease (age of onset: 34) in her mother; Heart disease (age of onset: 28) in her father; Stroke in her maternal aunt. There is no history of Hypertension or Diabetes. She was adopted.  ROS:   Please see the history of present illness.    All other systems reviewed and are negative.  EKGs/Labs/Other Studies Reviewed:    The following studies were reviewed today: I reviewed the records sent by the primary care physician's office at extensive length.   Recent Labs: 01/28/2016: TSH 0.83 04/14/2016: ALT 21 09/20/2016: Hemoglobin 14.1; Magnesium 2.2; Platelets 356.0 10/11/2016: BUN 16; Creatinine, Ser 0.92; Potassium 4.2; Sodium 136  Recent Lipid Panel    Component Value Date/Time   CHOL 153 12/06/2015 0927   TRIG 104.0 12/06/2015 0927   HDL 56.40 12/06/2015 0927   CHOLHDL 3 12/06/2015 0927   VLDL 20.8 12/06/2015 0927   LDLCALC 76 12/06/2015 0927   LDLDIRECT 131.9 10/28/2010 1103    Physical Exam:    VS:  BP (!) 156/66   Pulse 67   Ht 5' 1.5" (1.562 m)   Wt 140 lb 1.3 oz (63.5 kg)    LMP 04/17/1988   SpO2 96%   BMI 26.04 kg/m     Wt Readings from Last 3 Encounters:  11/06/16 140 lb 1.3 oz (63.5 kg)  10/25/16 139 lb (63 kg)  10/11/16 139 lb 12.8 oz (63.4 kg)     GEN: Patient is in no acute distress HEENT: Normal NECK: No JVD; No carotid bruits LYMPHATICS: No lymphadenopathy CARDIAC: S1 S2 regular, 2/6 systolic murmur at the apex. RESPIRATORY:  Clear to auscultation without rales, wheezing or rhonchi  ABDOMEN: Soft, non-tender, non-distended MUSCULOSKELETAL:  No edema; No deformity  SKIN: Warm and dry NEUROLOGIC:  Alert and oriented x 3 PSYCHIATRIC:  Normal affect    Signed, Garwin Brothers, MD  11/06/2016 3:16 PM    [facility]

## 2016-11-06 NOTE — Addendum Note (Signed)
Addended by: Warner Mccreedy E on: 11/06/2016 03:25 PM   Modules accepted: Orders

## 2016-11-16 ENCOUNTER — Ambulatory Visit (INDEPENDENT_AMBULATORY_CARE_PROVIDER_SITE_OTHER): Payer: Medicare Other

## 2016-11-16 ENCOUNTER — Other Ambulatory Visit: Payer: Self-pay

## 2016-11-16 ENCOUNTER — Other Ambulatory Visit: Payer: Self-pay | Admitting: Cardiology

## 2016-11-16 ENCOUNTER — Ambulatory Visit (HOSPITAL_COMMUNITY): Payer: Medicare Other | Attending: Cardiovascular Disease

## 2016-11-16 DIAGNOSIS — I10 Essential (primary) hypertension: Secondary | ICD-10-CM | POA: Insufficient documentation

## 2016-11-16 DIAGNOSIS — R42 Dizziness and giddiness: Secondary | ICD-10-CM

## 2016-11-16 DIAGNOSIS — E785 Hyperlipidemia, unspecified: Secondary | ICD-10-CM | POA: Insufficient documentation

## 2016-11-16 DIAGNOSIS — R001 Bradycardia, unspecified: Secondary | ICD-10-CM

## 2016-11-16 DIAGNOSIS — I340 Nonrheumatic mitral (valve) insufficiency: Secondary | ICD-10-CM | POA: Diagnosis not present

## 2016-12-06 ENCOUNTER — Telehealth: Payer: Self-pay

## 2016-12-06 NOTE — Telephone Encounter (Signed)
Called pt with Abnormal Holter Monitor results and new medication regiment. Pt declined starting Carvedilol 3.125 mg bid as Dr. Geraldo Pitter have ecommended. Pt stated that she has been doing fine Pulse has been doing well and BP has been too. She is apperhensive about it because she doesn't want to lower bp anymore that what it has been. I explained what was happening while wearing HM and that medication is a beta blocker that helps with arrhthymia as well not just for bp. Pt couldn't give me numbers of the readings from Bp, but just keep saying she feels better she thinks she just had a bad week when she had the symptoms before. Pt also stated she have a upcoming appt to see PCP and will discuss more of this at that time.

## 2016-12-06 NOTE — Telephone Encounter (Deleted)
Called pt with Abnormal Holter Monitor results and new medication regiment. Pt declined starting Carvedilol 3.125 mg bid as Dr. Geraldo Pitter have ecommended. Pt stated that she has been doing fine Pulse has been doing well and BP has been too. She is apperhensive about it because she doesn't want to lower bp anymore that what it has been. I explained what was happening while wearing HM and that medication is a beta blocker that helps with arrhthymia as well not just for bp. Pt couldn't give me numbers of the readings from Bp, but just keep saying she feels better she thinks she just had a bad week when she had the symptoms before. Pt also stated she have a upcoming appt to see PCP and will discuss more of this at that time.

## 2016-12-06 NOTE — Telephone Encounter (Signed)
-----   Message from Jenean Lindau, MD sent at 12/06/2016  3:35 PM EDT ----- Regarding: FW: Holter monitor Restart patient on carvedilol 3.125 mg twice a day and have patient come for pulse and blood pressure check in a week. Please inform results of Holter monitor.   ----- Message ----- From: Benita Stabile Sent: 11/21/2016   3:48 PM To: Jenean Lindau, MD Subject: Holter monitor                                 Please go to "my cupid studies assigned to me" reading worklist to review patients monitor and sign the study.

## 2016-12-07 ENCOUNTER — Telehealth: Payer: Self-pay | Admitting: Internal Medicine

## 2016-12-07 NOTE — Telephone Encounter (Signed)
LMOM informing Pt of PCP thoughts/recommendations. Pt has appt w/ PCP on 12/25/2016.

## 2016-12-07 NOTE — Telephone Encounter (Signed)
I suggest she come to this office next month, we'll talk more about her symptoms and review the test done at cardiology gait. If at the time she still feels she  needs to see a specialist, will arrange a referral.

## 2016-12-07 NOTE — Telephone Encounter (Signed)
Please advise 

## 2016-12-07 NOTE — Telephone Encounter (Signed)
Caller name: Relation to BZ:JIRC Call back [phone] Pharmacy:  Reason for call: pt would like to know if Dr. Larose Kells can refer her to a new heart doctor on [address]. Pt had appt with Dr. Geraldo Pitter, Patricia Moore states that the language barrier is not good at all and she does not understand him at all.

## 2016-12-25 ENCOUNTER — Ambulatory Visit (INDEPENDENT_AMBULATORY_CARE_PROVIDER_SITE_OTHER): Payer: Medicare Other | Admitting: Internal Medicine

## 2016-12-25 ENCOUNTER — Encounter: Payer: Self-pay | Admitting: Internal Medicine

## 2016-12-25 VITALS — BP 128/62 | HR 70 | Temp 97.8°F | Resp 14 | Ht 62.0 in | Wt 139.2 lb

## 2016-12-25 DIAGNOSIS — Z0283 Encounter for blood-alcohol and blood-drug test: Secondary | ICD-10-CM

## 2016-12-25 DIAGNOSIS — E119 Type 2 diabetes mellitus without complications: Secondary | ICD-10-CM

## 2016-12-25 DIAGNOSIS — I10 Essential (primary) hypertension: Secondary | ICD-10-CM | POA: Diagnosis not present

## 2016-12-25 DIAGNOSIS — Z23 Encounter for immunization: Secondary | ICD-10-CM | POA: Diagnosis not present

## 2016-12-25 DIAGNOSIS — E785 Hyperlipidemia, unspecified: Secondary | ICD-10-CM | POA: Diagnosis not present

## 2016-12-25 DIAGNOSIS — F419 Anxiety disorder, unspecified: Secondary | ICD-10-CM

## 2016-12-25 NOTE — Progress Notes (Signed)
Pre visit review using our clinic review tool, if applicable. No additional management support is needed unless otherwise documented below in the visit note. 

## 2016-12-25 NOTE — Patient Instructions (Addendum)
GO TO THE LAB : Provide a urine sample  GO TO THE FRONT DESK Schedule labs, blood work, to be done fasting within the next 2 or 3 days  Schedule your next appointment for a  physical exam in 4-5 months

## 2016-12-25 NOTE — Assessment & Plan Note (Signed)
DM: Currently on DiaBeta and based on the last A1c metformin dose was increased. Is doing better with diet. Check a A1c Hyperlipidemia: Due for labs, will come back fasting Anxiety: On Xanax, check a UDS today Chest pain: Was seen w/ CP by my 09/2016. Since then a stress test was negative, echo  unremarkable, she also had bradycardia, seen by Dr. Edilia Bo. A holter show a 9 beat nonsustained V. Tach. Eventually saw cardiology, they recommended to add carvedilol. She declined. She actually likes to see another cardiology. She is adamant about the issue. "I like to have a heart doctor". HTN: Currently symptoms controlled with Hyzaar, last BMP satisfactory RTC: CPX 4-5 months

## 2016-12-25 NOTE — Progress Notes (Signed)
Subjective:    Patient ID: Patricia Moore, female    DOB: 1934-02-22, 83 y.o.   MRN: [id number]  DOS:  12/25/2016 Type of visit - description : rov Interval history: Chest pain: Since the last office visit, she saw multiple doctors and have multiple tests, all reviewed. No further symptoms HTN: Good compliance of medication, ambulatory BPs are now within normal Pain, right foot: Chronic, plans to see her orthopedic doctor Diabetes: Since the last visit, we increased metformin dose, she is doing better with diet.  Wt Readings from Last 3 Encounters:  12/25/16 139 lb 4 oz (63.2 kg)  11/06/16 140 lb 1.3 oz (63.5 kg)  10/25/16 139 lb (63 kg)     Review of Systems No further chest pain. No nausea or vomiting  Past Medical History:  Diagnosis Date  . Allergy   . Anxiety 12/09/2013  . Arthritis   . Back pain   . Diabetes mellitus   . Diverticulitis    patient states has had three attacks   . Diverticulosis   . DJD (degenerative joint disease) 12/14/2014  . H. pylori infection 2001  . Heart murmur   . Hx of adenomatous colonic polyps   . Hyperlipidemia   . Hypertension   . Intraductal papilloma    bx nef x 2 in the 70s-90s  . Osteoporosis   . Saccular aneurysm   . SCC (squamous cell carcinoma) 03/2011   sees dermatology    Past Surgical History:  Procedure Laterality Date  . BREAST BIOPSY  '91, '95   INTRADUCTAL PAPILLOMA  . BREAST LUMPECTOMY    . CATARACT EXTRACTION Bilateral 09/2014    Social History   Social History  . Marital status: Married    Spouse name: N/A  . Number of children: 1  . Years of education: N/A   Occupational History  . retired  Futures trader   Social History Main Topics  . Smoking status: Never Smoker  . Smokeless tobacco: Never Used  . Alcohol use 0.6 oz/week    1 Glasses of wine per week     Comment: OCCASIONALLY  . Drug use: No  . Sexual activity: No   Other Topics Concern  . Not on file   Social History Narrative   Born in  [place], ,moved to the [place] in the 60s, live in [place] then in [place]   Lives w/ husband   H.S. graduate      Allergies as of 12/25/2016   No Known Allergies     Medication List       Accurate as of 12/25/16  7:01 PM. Always use your most recent med list.          ALPRAZolam 0.25 MG tablet Commonly known as:  XANAX Take 1 tablet (0.25 mg total) by mouth 3 (three) times daily as needed.   aspirin 81 MG tablet Take 81 mg by mouth daily.   baclofen 10 MG tablet Commonly known as:  LIORESAL Take 1 tablet by mouth as needed.   cholecalciferol 1000 units tablet Commonly known as:  VITAMIN D Take 1,000 Units by mouth daily.   co-enzyme Q-10 30 MG capsule Take 30 mg by mouth daily.   dorzolamide-timolol 22.3-6.8 MG/ML ophthalmic solution Commonly known as:  COSOPT Place 1 drop into both eyes 2 (two) times daily.   glucose blood test strip Commonly known as:  ONE TOUCH ULTRA TEST Check blood sugar no more than twice daily   glyBURIDE 2.5 MG tablet Commonly known  as:  DIABETA Take 1 tablet (2.5 mg total) by mouth 2 (two) times daily with a meal.   losartan-hydrochlorothiazide 100-12.5 MG tablet Commonly known as:  HYZAAR Take 1 tablet by mouth daily.   metFORMIN 1000 MG tablet Commonly known as:  GLUCOPHAGE Take 1 tablet by mouth with breakfast, 1 tablet with lunch, and 1/2 tablet with dinner daily   onetouch ultrasoft lancets Check blood sugar no more than twice daily.            Discharge Care Instructions        Start     Ordered   12/25/16 0000  Pain Mgmt, Profile 8 w/Conf, U     12/25/16 1130   12/25/16 0000  Flu vaccine HIGH DOSE PF (Fluzone High dose)     12/25/16 1153   12/25/16 0000  AST     12/25/16 1154   12/25/16 0000  ALT     12/25/16 1154   12/25/16 0000  Lipid panel     12/25/16 1154   12/25/16 0000  Hemoglobin A1c     12/25/16 1154   12/25/16 0000  Ambulatory referral to Cardiology    Comments:  Per Pt- Dr. Tomie China recommended she see another  cardiologist (language barrier due to strong accents?)   12/25/16 1156         Objective:   Physical Exam BP 128/62 (BP Location: Left Arm, Patient Position: Sitting, Cuff Size: Small)   Pulse 70   Temp 97.8 F (36.6 C) (Oral)   Resp 14   Ht 5\' 2"  (1.575 m)   Wt 139 lb 4 oz (63.2 kg)   LMP 04/17/1988   SpO2 98%   BMI 25.47 kg/m  General:   Well developed, well nourished . NAD.  HEENT:  Normocephalic . Face symmetric, atraumatic Lungs:  CTA B Normal respiratory effort, no intercostal retractions, no accessory muscle use. Heart: RRR,  no murmur.  No pretibial edema bilaterally  Skin: Not pale. Not jaundice Neurologic:  alert & oriented X3.  Speech normal, gait appropriate for age and unassisted Psych--  Cognition and judgment appear intact.  Cooperative with normal attention span and concentration.  Behavior appropriate. No anxious or depressed appearing.      Assessment & Plan:   Assessment  DM HTN Hyperlipidemia -- intolerant to lipitor and crestor (severe cramps) as off 01-2016  Anxiety- xanax rx by pcp Vertigo - valium prn GI: --Recurrent diverticulitis (one episode documented by a CT 11-2014) --colonoscopy 10-2013, Dr. Marina Goodell, + polyps, melanosis coli, tics --H. pylori 2001 DJD--back pain, knees, neck pain ( on baclofen for neck) Osteoporosis  SCC, skin cancer Elevated homocysteine NEURO --Carotid US 10-2013: No significant plaque, although ICA's are serpentine, bilaterally. 40-59% bilateral ICA stenosis by velocity criteria likely due to tortuosity. Patent vertebral arteries with antegrade flow.Normal subclavian arteries, bilaterally. -- Transinet dizziness, diplopia sx late 2016 , saw neuro >>> Dx w/ TIA/Stroke on clinical grounds  W/u:  Brain  MRI 03-2015 >> 2 mm saccular aneurysm at the right posterior communicating artery origin, saw  Neurology, Rx to recheck MRI 1 year, ok continue ASA per neuro CTA head, neck 05-14-2015: aneurysm confirmed, otherwise  (-) --Admitted 07-2015, dizziness, MRI negative, DX labyrinthitis    PLAN: DM: Currently on DiaBeta and based on the last A1c metformin dose was increased. Is doing better with diet. Check a A1c Hyperlipidemia: Due for labs, will come back fasting Anxiety: On Xanax, check a UDS today Chest pain: Was seen w/ CP by  my 09/2016. Since then a stress test was negative, echo  unremarkable, she also had bradycardia, seen by Dr. Dallas Schimke. A holter show a 9 beat nonsustained V. Tach. Eventually saw cardiology, they recommended to add carvedilol. She declined. She actually likes to see another cardiology. She is adamant about the issue. "I like to have a heart doctor". HTN: Currently symptoms controlled with Hyzaar, last BMP satisfactory RTC: CPX 4-5 months

## 2016-12-26 ENCOUNTER — Other Ambulatory Visit (INDEPENDENT_AMBULATORY_CARE_PROVIDER_SITE_OTHER): Payer: Medicare Other

## 2016-12-26 DIAGNOSIS — E119 Type 2 diabetes mellitus without complications: Secondary | ICD-10-CM | POA: Diagnosis not present

## 2016-12-26 DIAGNOSIS — E785 Hyperlipidemia, unspecified: Secondary | ICD-10-CM | POA: Diagnosis not present

## 2016-12-26 LAB — PAIN MGMT, PROFILE 8 W/CONF, U
6 Acetylmorphine: NEGATIVE ng/mL (ref <10)
Alcohol Metabolites: NEGATIVE ng/mL (ref <500)
Amphetamines: NEGATIVE ng/mL (ref <500)
Benzodiazepines: NEGATIVE ng/mL (ref <100)
Buprenorphine, Urine: NEGATIVE ng/mL (ref <5)
Cocaine Metabolite: NEGATIVE ng/mL (ref <150)
Creatinine: 33.5 mg/dL
MDMA: NEGATIVE ng/mL (ref <500)
Marijuana Metabolite: NEGATIVE ng/mL (ref <20)
Opiates: NEGATIVE ng/mL (ref <100)
Oxidant: NEGATIVE ug/mL (ref <200)
Oxycodone: NEGATIVE ng/mL (ref <100)
pH: 6.86 (ref 4.5–9.0)

## 2016-12-26 LAB — LIPID PANEL
Cholesterol: 171 mg/dL (ref 0–200)
HDL: 62 mg/dL (ref >39.00)
LDL Cholesterol: 87 mg/dL (ref 0–99)
NonHDL: 109.01
Total CHOL/HDL Ratio: 3
Triglycerides: 112 mg/dL (ref 0.0–149.0)
VLDL: 22.4 mg/dL (ref 0.0–40.0)

## 2016-12-26 LAB — ALT: ALT: 18 U/L (ref 0–35)

## 2016-12-26 LAB — AST: AST: 27 U/L (ref 0–37)

## 2016-12-26 LAB — HEMOGLOBIN A1C: Hgb A1c MFr Bld: 6.4 % (ref 4.6–6.5)

## 2016-12-28 ENCOUNTER — Encounter: Payer: Self-pay | Admitting: Internal Medicine

## 2016-12-28 ENCOUNTER — Telehealth: Payer: Self-pay | Admitting: Internal Medicine

## 2016-12-28 MED ORDER — MECLIZINE HCL 25 MG PO TABS: 25.0000 mg | ORAL_TABLET | Freq: Three times a day (TID) | ORAL | 0 refills | Status: DC | PRN

## 2016-12-28 MED ORDER — ALPRAZOLAM 0.25 MG PO TABS: 0.2500 mg | ORAL_TABLET | Freq: Every day | ORAL | 0 refills | Status: DC | PRN

## 2016-12-28 NOTE — Telephone Encounter (Addendum)
°  Call back number: [phone]  Pharmacy: [facility] [address] [place] [address] [phone] (Phone) [phone] (Fax)     Reason for call:  Daughter requesting meclizine (ANTIVERT) tablet 25 mg for her nausea and diazepam (VALIUM) 5 MG tablet due to anxiety and would like Rx today, please advise

## 2016-12-28 NOTE — Telephone Encounter (Signed)
I spoke with the patient: Patricia Moore with dizziness, nausea today at 3 AM. Symptoms are similar to previous vertigo, treated by moving her head. She denies diplopia, sore speech or focal deficits. We agreed to rest, fluids, we are sending meclizine and also take  alprazolam. Previously she used to take diazepam but I recommend her to avoid that since she is already taking Xanax. She is aware that needs to go to the ER if severe symptoms, or the symptoms are different, or if she has   diplopia, slurred speech etc.

## 2016-12-28 NOTE — Telephone Encounter (Signed)
Advise patient: Cannot refill diazepam, take alprazolam as needed for anxiety. Okay to use meclizine as needed for dizziness, reactivate previous order and send #30, no refills. If she has severe symptoms or dizziness is different than previous needs to be seen

## 2016-12-28 NOTE — Telephone Encounter (Signed)
Please advise. Pt has alprazolam on med list not diazepam and meclizine also not on med list??

## 2016-12-28 NOTE — Telephone Encounter (Signed)
Meclizine and Alprazolam refilled to [facility].

## 2016-12-28 NOTE — Telephone Encounter (Signed)
error:[id number] ° °

## 2017-01-09 ENCOUNTER — Other Ambulatory Visit: Payer: Self-pay | Admitting: Internal Medicine

## 2017-01-31 ENCOUNTER — Telehealth: Payer: Self-pay | Admitting: Cardiology

## 2017-01-31 NOTE — Telephone Encounter (Addendum)
New message      Pt would like to switch from Dr Geraldo Pitter to Dr Marlou Porch.  She said there is a language barrier.  Is it ok with you both?

## 2017-02-01 NOTE — Telephone Encounter (Signed)
Unfortunately, I do not have availability.  Extensive cardiology evaluation performed, stress test, echo, monitor.  She may continue to follow with Dr. Larose Kells.   Candee Furbish, MD

## 2017-03-05 ENCOUNTER — Other Ambulatory Visit: Payer: Self-pay | Admitting: Internal Medicine

## 2017-03-05 NOTE — Telephone Encounter (Signed)
Okay #30, 1 refill 

## 2017-03-05 NOTE — Telephone Encounter (Signed)
Pt is requesting refill on alprazolam 0.25mg   Last OV: 12/25/2016  Last Fill: 12/28/2016 #30 and 0RF UDS: 12/01/2015 Low risk  NCCR printed;   Diazepam 5mg  prescribed and filled on 12/28/2016 #40 and 0RF by Dr. Melony Overly, she also had alprazolam 0.25mg  filled 12/28/2016 (same day) for #30 and 0RF?  Please advise.

## 2017-03-06 NOTE — Telephone Encounter (Signed)
Rx faxed to [facility]  

## 2017-03-06 NOTE — Telephone Encounter (Signed)
Rx printed, awaiting MD signature.  

## 2017-05-15 ENCOUNTER — Encounter: Payer: Self-pay | Admitting: Internal Medicine

## 2017-05-15 ENCOUNTER — Ambulatory Visit (INDEPENDENT_AMBULATORY_CARE_PROVIDER_SITE_OTHER): Payer: Medicare Other | Admitting: Internal Medicine

## 2017-05-15 VITALS — BP 108/74 | HR 64 | Temp 98.0°F | Resp 14 | Ht 62.0 in | Wt 139.5 lb

## 2017-05-15 DIAGNOSIS — E119 Type 2 diabetes mellitus without complications: Secondary | ICD-10-CM | POA: Diagnosis not present

## 2017-05-15 DIAGNOSIS — F419 Anxiety disorder, unspecified: Secondary | ICD-10-CM

## 2017-05-15 DIAGNOSIS — I7300 Raynaud's syndrome without gangrene: Secondary | ICD-10-CM | POA: Insufficient documentation

## 2017-05-15 DIAGNOSIS — E875 Hyperkalemia: Secondary | ICD-10-CM

## 2017-05-15 DIAGNOSIS — Z0000 Encounter for general adult medical examination without abnormal findings: Secondary | ICD-10-CM

## 2017-05-15 DIAGNOSIS — R748 Abnormal levels of other serum enzymes: Secondary | ICD-10-CM

## 2017-05-15 DIAGNOSIS — R7989 Other specified abnormal findings of blood chemistry: Secondary | ICD-10-CM

## 2017-05-15 DIAGNOSIS — Z79899 Other long term (current) drug therapy: Secondary | ICD-10-CM | POA: Diagnosis not present

## 2017-05-15 LAB — BASIC METABOLIC PANEL
BUN: 33 mg/dL — ABNORMAL HIGH (ref 6–23)
CO2: 33 mEq/L — ABNORMAL HIGH (ref 19–32)
Calcium: 10.9 mg/dL — ABNORMAL HIGH (ref 8.4–10.5)
Chloride: 101 mEq/L (ref 96–112)
Creatinine, Ser: 1.1 mg/dL (ref 0.40–1.20)
GFR: 50.62 mL/min — ABNORMAL LOW (ref >60.00)
Glucose, Bld: 175 mg/dL — ABNORMAL HIGH (ref 70–99)
Potassium: 5.6 mEq/L — ABNORMAL HIGH (ref 3.5–5.1)
Sodium: 141 mEq/L (ref 135–145)

## 2017-05-15 LAB — HEMOGLOBIN A1C: Hgb A1c MFr Bld: 6.6 % — ABNORMAL HIGH (ref 4.6–6.5)

## 2017-05-15 MED ORDER — LOSARTAN POTASSIUM 50 MG PO TABS: 50.0000 mg | ORAL_TABLET | Freq: Every day | ORAL | 0 refills | Status: DC

## 2017-05-15 MED ORDER — HYDROCHLOROTHIAZIDE 25 MG PO TABS: 25.0000 mg | ORAL_TABLET | Freq: Every day | ORAL | 0 refills | Status: DC

## 2017-05-15 NOTE — Assessment & Plan Note (Addendum)
-  Td 2007, declined booster today; Pneumonia shot 2014 declined booster d/t h/o poor tolerance; prevnar:01-2015; zostavax: 2011; had a flu shot  CCS: -CCS: Last colonoscopy 10-2013, + polyps,  per GI no f/u d/t age  - Female care used to see  Dr Toney Rakes, plans to se a doctor in the same office [facility] 08-2016, DEXA Tscore -1.9 on 11-2015 Does not need PAPs per pt  -Counseled: Diet, exercise -Recommend a Medicare wellness at her convenience -Labs: BMP, A1c, homocystine , UDS.

## 2017-05-15 NOTE — Progress Notes (Signed)
Subjective:    Patient ID: Patricia Moore, female    DOB: 12-Nov-1933, 84 y.o.   MRN: [id number]  DOS:  05/15/2017 Type of visit - description : CPX Interval history: No major concerns    Review of Systems Reports she is feeling very good.  Has a very mild and transient (few seconds) headache. Still has occasional chest pain, mild. This winter had two episodes where her fingers get white and numb transiently.  Other than above, a 14 point review of systems is negative    Past Medical History:  Diagnosis Date  . Allergy   . Anxiety 12/09/2013  . Arthritis   . Back pain   . Diabetes mellitus   . Diverticulitis    patient states has had three attacks   . Diverticulosis   . DJD (degenerative joint disease) 12/14/2014  . H. pylori infection 2001  . Heart murmur   . Hx of adenomatous colonic polyps   . Hyperlipidemia   . Hypertension   . Intraductal papilloma    bx nef x 2 in the 70s-90s  . Osteoporosis   . Saccular aneurysm   . SCC (squamous cell carcinoma) 03/2011   sees dermatology    Past Surgical History:  Procedure Laterality Date  . BREAST BIOPSY  '91, '95   INTRADUCTAL PAPILLOMA  . BREAST LUMPECTOMY    . CATARACT EXTRACTION Bilateral 09/2014    Social History   Socioeconomic History  . Marital status: Married    Spouse name: Not on file  . Number of children: 1  . Years of education: Not on file  . Highest education level: Not on file  Social Needs  . Financial resource strain: Not on file  . Food insecurity - worry: Not on file  . Food insecurity - inability: Not on file  . Transportation needs - medical: Not on file  . Transportation needs - non-medical: Not on file  Occupational History  . 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 Smoking status: Never Smoker  . Smokeless tobacco: Never Used  Substance and Sexual Activity  . Alcohol use: Yes    Alcohol/week: 0.6 oz    Types: 1 Glasses of wine per week    Comment:  OCCASIONALLY  . Drug use: No  . Sexual activity: No  Other Topics Concern  . Not on file  Social History Narrative   Born in [place], ,moved to the [place] in the 60s, live in [place] then in [place]   Lives w/ husband   H.S. Buyer, retail   Son lives in [place])      Family History  Adopted: Yes  Problem Relation Age of Onset  . Heart disease Mother 70  . Heart disease Father 44  . Breast cancer Maternal Aunt   . Stroke Maternal Aunt   . Colon cancer Paternal Uncle        7 uncles  . Colon cancer Maternal Grandfather        COLON,, FAMILY HX 7 MEMBERS  . Breast cancer Maternal Aunt   . Cancer Sister        skin  . Hypertension Neg Hx   . Diabetes Neg Hx     Allergies as of 05/15/2017   No Known Allergies     Medication List        Accurate as of 05/15/17 11:59 PM. Always use your most recent med list.  ALPRAZolam 0.25 MG tablet Commonly known as:  XANAX Take 1 tablet (0.25 mg total) 3 (three) times daily as needed by mouth.   aspirin 81 MG tablet Take 81 mg by mouth daily.   baclofen 10 MG tablet Commonly known as:  LIORESAL Take 1 tablet by mouth as needed.   cholecalciferol 1000 units tablet Commonly known as:  VITAMIN D Take 1,000 Units by mouth daily.   co-enzyme Q-10 30 MG capsule Take 30 mg by mouth daily.   dorzolamide-timolol 22.3-6.8 MG/ML ophthalmic solution Commonly known as:  COSOPT Place 1 drop into both eyes 2 (two) times daily.   glucose blood test strip Commonly known as:  ONE TOUCH ULTRA TEST CHECK BLOOD SUGAR NO MORE THAN 2 TIMES DAILY   glyBURIDE 2.5 MG tablet Commonly known as:  DIABETA Take 1 tablet (2.5 mg total) by mouth 2 (two) times daily with a meal.   hydrochlorothiazide 25 MG tablet Commonly known as:  HYDRODIURIL Take 1 tablet (25 mg total) by mouth daily.   losartan 50 MG tablet Commonly known as:  COZAAR Take 1 tablet (50 mg total) by mouth daily.   meclizine 25 MG tablet Commonly known as:  ANTIVERT Take 1 tablet (25  mg total) by mouth 3 (three) times daily as needed for dizziness or nausea.   metFORMIN 1000 MG tablet Commonly known as:  GLUCOPHAGE Take 1 tablet by mouth with breakfast, 1 tablet with lunch, and 1/2 tablet with dinner daily   onetouch ultrasoft lancets CHECK BLOOD SUGAR NO MORE THAN TWICE DAILY          Objective:   Physical Exam BP 108/74 (BP Location: Left Arm, Patient Position: Sitting, Cuff Size: Small)   Pulse 64   Temp 98 F (36.7 C) (Oral)   Resp 14   Ht 5\' 2"  (1.575 m)   Wt 139 lb 8 oz (63.3 kg)   LMP 04/17/1988   SpO2 95%   BMI 25.51 kg/m  General:   Well developed, well nourished . NAD.  Neck: No  thyromegaly  HEENT:  Normocephalic . Face symmetric, atraumatic Lungs:  CTA B Normal respiratory effort, no intercostal retractions, no accessory muscle use. Heart: RRR,  no murmur.  No pretibial edema bilaterally  Abdomen:  Not distended, soft, non-tender. No rebound or rigidity.   Skin: Exposed areas without rash. Not pale. Not jaundice Neurologic:  alert & oriented X3.  Speech normal, gait appropriate for age and unassisted Strength symmetric and appropriate for age.  Psych: Cognition and judgment appear intact.  Cooperative with normal attention span and concentration.  Behavior appropriate. No anxious or depressed appearing.     Assessment & Plan:    Assessment  DM HTN Hyperlipidemia -- intolerant to lipitor and crestor (severe cramps) as off 01-2016  Anxiety- xanax rx by pcp Vertigo - valium prn GI: --Recurrent diverticulitis (one episode documented by a CT 11-2014) --colonoscopy 10-2013, Dr. Marina Goodell, + polyps, melanosis coli, tics --H. pylori 2001 DJD--back pain, knees, neck pain ( on baclofen for neck) Osteoporosis  SCC, skin cancer Elevated homocysteine Raynaud's phenomena  NEURO --Carotid US 10-2013: No significant plaque, although ICA's are serpentine, bilaterally. 40-59% bilateral ICA stenosis by velocity criteria likely due to  tortuosity. Patent vertebral arteries with antegrade flow.Normal subclavian arteries, bilaterally. -- Transinet dizziness, diplopia sx late 2016 , saw neuro >>> Dx w/ TIA/Stroke on clinical grounds  W/u:  Brain  MRI 03-2015 >> 2 mm saccular aneurysm at the right posterior communicating artery origin, saw  Neurology,  Rx to recheck MRI 1 year, ok continue ASA per neuro CTA head, neck 05-14-2015: aneurysm confirmed, otherwise (-) --brain aneurysm see above, saw neuro 2018, consider repeat imagine 2020 --Admitted 07-2015, dizziness, MRI negative, DX labyrinthitis    PLAN: DM: On glyburide and metformin.  She is taking metformin differently: 1 tablet in the morning, only 1/2 with lunch and 1/2  with breakfast.  Check A1c Hyperlipidemia: Diet controlled, last FLP satisfactory Anxiety: On Xanax, UDS and contract today Elevated homocysteine: She stopped folic acid, likes to see how her homocystine level is.  Will check labs Raynaud phenomena: On and off, recommend gloves, call if symptoms severe. Chest pain: Dorna Bloom (-), now minimal sxs, requested anew cardiologist but unable to switch  RTC 6 months

## 2017-05-15 NOTE — Patient Instructions (Signed)
GO TO THE LAB : Get the blood work     GO TO THE FRONT DESK Schedule your next appointment for a checkup in 6 months  Please consider do a Medicare wellness visit with one of our nurses

## 2017-05-15 NOTE — Progress Notes (Signed)
Pre visit review using our clinic review tool, if applicable. No additional management support is needed unless otherwise documented below in the visit note. 

## 2017-05-16 NOTE — Assessment & Plan Note (Signed)
DM: On glyburide and metformin.  She is taking metformin differently: 1 tablet in the morning, only 1/2 with lunch and 1/2  with breakfast.  Check A1c Hyperlipidemia: Diet controlled, last FLP satisfactory Anxiety: On Xanax, UDS and contract today Elevated homocysteine: She stopped folic acid, likes to see how her homocystine level is.  Will check labs Raynaud phenomena: On and off, recommend gloves, call if symptoms severe. Chest pain: Elba Barman (-), now minimal sxs, requested anew cardiologist but unable to switch  RTC 6 months

## 2017-05-18 MED ORDER — FOLIC ACID 1 MG PO TABS: 1.0000 mg | ORAL_TABLET | Freq: Every day | ORAL | 12 refills | Status: DC

## 2017-05-18 NOTE — Addendum Note (Signed)
Addended byDamita Dunnings D on: 05/18/2017 01:32 PM   Modules accepted: Orders

## 2017-05-21 LAB — PAIN MGMT, PROFILE 8 W/CONF, U
6 Acetylmorphine: NEGATIVE ng/mL (ref <10)
Alcohol Metabolites: NEGATIVE ng/mL (ref <500)
Alphahydroxyalprazolam: NEGATIVE ng/mL (ref <25)
Alphahydroxymidazolam: NEGATIVE ng/mL (ref <50)
Alphahydroxytriazolam: NEGATIVE ng/mL (ref <50)
Aminoclonazepam: NEGATIVE ng/mL (ref <25)
Amphetamines: NEGATIVE ng/mL (ref <500)
Benzodiazepines: POSITIVE ng/mL — AB (ref <100)
Buprenorphine, Urine: NEGATIVE ng/mL (ref <5)
Cocaine Metabolite: NEGATIVE ng/mL (ref <150)
Creatinine: 117 mg/dL
Hydroxyethylflurazepam: NEGATIVE ng/mL (ref <50)
Lorazepam: NEGATIVE ng/mL (ref <50)
MDMA: NEGATIVE ng/mL (ref <500)
Marijuana Metabolite: NEGATIVE ng/mL (ref <20)
Nordiazepam: 61 ng/mL — ABNORMAL HIGH (ref <50)
Opiates: NEGATIVE ng/mL (ref <100)
Oxazepam: 162 ng/mL — ABNORMAL HIGH (ref <50)
Oxidant: NEGATIVE ug/mL (ref <200)
Oxycodone: NEGATIVE ng/mL (ref <100)
Temazepam: 236 ng/mL — ABNORMAL HIGH (ref <50)
pH: 7.75 (ref 4.5–9.0)

## 2017-05-21 LAB — HOMOCYSTEINE: Homocysteine: 13.1 umol/L — ABNORMAL HIGH (ref <10.4)

## 2017-05-25 ENCOUNTER — Other Ambulatory Visit: Payer: Self-pay | Admitting: Emergency Medicine

## 2017-05-25 ENCOUNTER — Other Ambulatory Visit (INDEPENDENT_AMBULATORY_CARE_PROVIDER_SITE_OTHER): Payer: Medicare Other

## 2017-05-25 DIAGNOSIS — E875 Hyperkalemia: Secondary | ICD-10-CM

## 2017-05-25 LAB — BASIC METABOLIC PANEL
BUN: 34 mg/dL — ABNORMAL HIGH (ref 6–23)
CO2: 31 mEq/L (ref 19–32)
Calcium: 10.4 mg/dL (ref 8.4–10.5)
Chloride: 100 mEq/L (ref 96–112)
Creatinine, Ser: 1.04 mg/dL (ref 0.40–1.20)
GFR: 54 mL/min — ABNORMAL LOW (ref >60.00)
Glucose, Bld: 182 mg/dL — ABNORMAL HIGH (ref 70–99)
Potassium: 5.4 mEq/L — ABNORMAL HIGH (ref 3.5–5.1)
Sodium: 138 mEq/L (ref 135–145)

## 2017-05-25 MED ORDER — AMLODIPINE BESYLATE 5 MG PO TABS: 5.0000 mg | ORAL_TABLET | Freq: Every day | ORAL | 6 refills | Status: DC

## 2017-05-28 ENCOUNTER — Encounter: Payer: Self-pay | Admitting: Internal Medicine

## 2017-05-28 ENCOUNTER — Ambulatory Visit (INDEPENDENT_AMBULATORY_CARE_PROVIDER_SITE_OTHER): Payer: Medicare Other | Admitting: Internal Medicine

## 2017-05-28 VITALS — BP 124/68 | HR 73 | Temp 97.6°F | Resp 14 | Ht 62.0 in | Wt 144.1 lb

## 2017-05-28 DIAGNOSIS — I10 Essential (primary) hypertension: Secondary | ICD-10-CM

## 2017-05-28 DIAGNOSIS — R079 Chest pain, unspecified: Secondary | ICD-10-CM | POA: Diagnosis not present

## 2017-05-28 NOTE — Patient Instructions (Addendum)
Make an appointment for labs next week  I'll see you in July for a check up   Check the  blood pressure 2 or 3 times a  week   Be sure your blood pressure is between 110/65 and  135/85. If it is consistently higher or lower, let me know

## 2017-05-28 NOTE — Progress Notes (Signed)
Subjective:    Patient ID: Patricia Moore, female    DOB: [birth year], 84 y.o.   MRN: [id number]  DOS:  05/28/2017 Type of visit - description : acute Interval history: Here to discuss his recent potassium levels and also about other symptoms. She is stopped losartan as recommended.  BP today is very good Continue with occasional chest pain, left-sided, worse when she uses her bra, some radiation posteriorly. Also occasional palpitations Has developed left-sided neck pain, mild, restarted baclofen.   Review of Systems No lower extremity edema  Past Medical History:  Diagnosis Date  . Allergy   . Anxiety 12/09/2013  . Arthritis   . Back pain   . Diabetes mellitus   . Diverticulitis    patient states has had three attacks   . Diverticulosis   . DJD (degenerative joint disease) 12/14/2014  . H. pylori infection 2001  . Heart murmur   . Hx of adenomatous colonic polyps   . Hyperlipidemia   . Hypertension   . Intraductal papilloma    bx nef x 2 in the 70s-90s  . Osteoporosis   . Saccular aneurysm   . SCC (squamous cell carcinoma) 03/2011   sees dermatology    Past Surgical History:  Procedure Laterality Date  . BREAST BIOPSY  '91, '95   INTRADUCTAL PAPILLOMA  . BREAST LUMPECTOMY    . CATARACT EXTRACTION Bilateral 09/2014    Social History   Socioeconomic History  . Marital status: Married    Spouse name: Not on file  . Number of children: 1  . Years of education: Not on file  . Highest education level: Not on file  Social Needs  . Financial resource strain: Not on file  . Food insecurity - worry: Not on file  . Food insecurity - inability: Not on file  . Transportation needs - medical: Not on file  . Transportation needs - non-medical: Not on file  Occupational History  . 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 Smoking status: Never Smoker  . Smokeless tobacco: Never Used  Substance and Sexual Activity  . Alcohol use: Yes   Alcohol/week: 0.6 oz    Types: 1 Glasses of wine per week    Comment: OCCASIONALLY  . Drug use: No  . Sexual activity: No  Other Topics Concern  . Not on file  Social History Narrative   Born in [place], ,moved to the [place] in the 60s, live in [place] then in [place]   Lives w/ husband   H.S. Buyer, retail   Son lives in [place])       Allergies as of 05/28/2017   No Known Allergies     Medication List        Accurate as of 05/28/17  6:03 PM. Always use your most recent med list.          ALPRAZolam 0.25 MG tablet Commonly known as:  XANAX Take 1 tablet (0.25 mg total) 3 (three) times daily as needed by mouth.   amLODipine 5 MG tablet Commonly known as:  NORVASC Take 1 tablet (5 mg total) by mouth daily.   aspirin 81 MG tablet Take 81 mg by mouth daily.   baclofen 10 MG tablet Commonly known as:  LIORESAL Take 1 tablet by mouth as needed.   cholecalciferol 1000 units tablet Commonly known as:  VITAMIN D Take 1,000 Units by mouth daily.   co-enzyme Q-10 30 MG capsule Take 30 mg by  mouth daily.   diazepam 5 MG tablet Commonly known as:  VALIUM Take 5 mg by mouth daily as needed (dizziness).   dorzolamide-timolol 22.3-6.8 MG/ML ophthalmic solution Commonly known as:  COSOPT Place 1 drop into both eyes 2 (two) times daily.   folic acid 1 MG tablet Commonly known as:  FOLVITE Take 1 tablet (1 mg total) by mouth daily.   glucose blood test strip Commonly known as:  ONE TOUCH ULTRA TEST CHECK BLOOD SUGAR NO MORE THAN 2 TIMES DAILY   glyBURIDE 2.5 MG tablet Commonly known as:  DIABETA Take 1 tablet (2.5 mg total) by mouth 2 (two) times daily with a meal.   hydrochlorothiazide 25 MG tablet Commonly known as:  HYDRODIURIL Take 1 tablet (25 mg total) by mouth daily.   losartan 50 MG tablet Commonly known as:  COZAAR Take 1 tablet (50 mg total) by mouth daily.   meclizine 25 MG tablet Commonly known as:  ANTIVERT Take 1 tablet (25 mg total) by mouth 3 (three) times  daily as needed for dizziness or nausea.   metFORMIN 1000 MG tablet Commonly known as:  GLUCOPHAGE Take 1 tablet by mouth with breakfast, 1 tablet with lunch, and 1/2 tablet with dinner daily   onetouch ultrasoft lancets CHECK BLOOD SUGAR NO MORE THAN TWICE DAILY          Objective:   Physical Exam BP 124/68 (BP Location: Left Arm, Patient Position: Sitting, Cuff Size: Small)   Pulse 73   Temp 97.6 F (36.4 C) (Oral)   Resp 14   Ht 5\' 2"  (1.575 m)   Wt 144 lb 2 oz (65.4 kg)   LMP 04/17/1988   SpO2 97%   BMI 26.36 kg/m  General:   Well developed, well nourished . NAD.  HEENT:  Normocephalic . Face symmetric, atraumatic Lungs:  CTA B Normal respiratory effort, no intercostal retractions, no accessory muscle use. Heart: RRR,  no murmur.  No pretibial edema bilaterally  Skin: Not pale. Not jaundice Neurologic:  alert & oriented X3.  Speech normal, gait appropriate for age and unassisted Psych--  Cognition and judgment appear intact.  Cooperative with normal attention span and concentration.  Behavior appropriate. No anxious or depressed appearing.      Assessment & Plan:    Assessment  DM HTN Hyperlipidemia -- intolerant to lipitor and crestor (severe cramps) as off 01-2016  Anxiety- xanax rx by pcp Vertigo - valium prn GI: --Recurrent diverticulitis (one episode documented by a CT 11-2014) --colonoscopy 10-2013, Dr. Marina Goodell, + polyps, melanosis coli, tics --H. pylori 2001 DJD--back pain, knees, neck pain ( on baclofen for neck) Osteoporosis  SCC, skin cancer Elevated homocysteine Raynaud's phenomena  NEURO --Carotid US 10-2013: No significant plaque, although ICA's are serpentine, bilaterally. 40-59% bilateral ICA stenosis by velocity criteria likely due to tortuosity. Patent vertebral arteries with antegrade flow.Normal subclavian arteries, bilaterally. -- Transinet dizziness, diplopia sx late 2016 , saw neuro >>> Dx w/ TIA/Stroke on clinical grounds  W/u:   Brain  MRI 03-2015 >> 2 mm saccular aneurysm at the right posterior communicating artery origin, saw  Neurology, Rx to recheck MRI 1 year, ok continue ASA per neuro CTA head, neck 05-14-2015: aneurysm confirmed, otherwise (-) --brain aneurysm see above, saw neuro 2018, consider repeat imagine 2020 --Admitted 07-2015, dizziness, MRI negative, DX labyrinthitis    PLAN: HTN: Since the last visit, we noted hyperkalemia, losartan dose decreased from 100 mg to 50 mg, hyperkalemia persisted, losartan stopped, started amlodipine few days ago  and continue with HCTZ. Too early to recheck a BMP, will do next week.  BP remains controlled, no edema so far. Chest pain: As described above, worse when she uses her bra, previous workup negative.  Recommend observation Neck pain: Restarted several days ago, on baclofen which historically helps. RTC July as already scheduled

## 2017-05-28 NOTE — Progress Notes (Signed)
Pre visit review using our clinic review tool, if applicable. No additional management support is needed unless otherwise documented below in the visit note. 

## 2017-05-28 NOTE — Assessment & Plan Note (Signed)
HTN: Since the last visit, we noted hyperkalemia, losartan dose decreased from 100 mg to 50 mg, hyperkalemia persisted, losartan stopped, started amlodipine few days ago and continue with HCTZ. Too early to recheck a BMP, will do next week.  BP remains controlled, no edema so far. Chest pain: As described above, worse when she uses her bra, previous workup negative.  Recommend observation Neck pain: Restarted several days ago, on baclofen which historically helps. RTC July as already scheduled

## 2017-06-04 ENCOUNTER — Other Ambulatory Visit: Payer: Self-pay | Admitting: Emergency Medicine

## 2017-06-04 ENCOUNTER — Other Ambulatory Visit (INDEPENDENT_AMBULATORY_CARE_PROVIDER_SITE_OTHER): Payer: Medicare Other

## 2017-06-04 DIAGNOSIS — E875 Hyperkalemia: Secondary | ICD-10-CM

## 2017-06-04 LAB — BASIC METABOLIC PANEL
BUN: 29 mg/dL — ABNORMAL HIGH (ref 6–23)
CO2: 31 mEq/L (ref 19–32)
Calcium: 10.2 mg/dL (ref 8.4–10.5)
Chloride: 100 mEq/L (ref 96–112)
Creatinine, Ser: 0.99 mg/dL (ref 0.40–1.20)
GFR: 57.16 mL/min — ABNORMAL LOW (ref >60.00)
Glucose, Bld: 173 mg/dL — ABNORMAL HIGH (ref 70–99)
Potassium: 5.3 mEq/L — ABNORMAL HIGH (ref 3.5–5.1)
Sodium: 138 mEq/L (ref 135–145)

## 2017-06-05 ENCOUNTER — Telehealth: Payer: Self-pay | Admitting: Internal Medicine

## 2017-06-05 NOTE — Telephone Encounter (Signed)
Sent!

## 2017-06-05 NOTE — Telephone Encounter (Signed)
Pt is requesting refill on alprazolam   Last OV: 05/28/2017 Last Fill: 03/06/2018 #30 and 1RF UDS: 05/15/2017 Low risk  Please advise.

## 2017-06-26 ENCOUNTER — Ambulatory Visit (INDEPENDENT_AMBULATORY_CARE_PROVIDER_SITE_OTHER): Payer: Medicare Other | Admitting: Internal Medicine

## 2017-06-26 ENCOUNTER — Encounter: Payer: Self-pay | Admitting: Internal Medicine

## 2017-06-26 VITALS — BP 142/82 | HR 99 | Temp 97.9°F | Resp 14 | Ht 62.0 in | Wt 147.1 lb

## 2017-06-26 DIAGNOSIS — I10 Essential (primary) hypertension: Secondary | ICD-10-CM | POA: Diagnosis not present

## 2017-06-26 LAB — BASIC METABOLIC PANEL
BUN: 29 mg/dL — ABNORMAL HIGH (ref 6–23)
CO2: 31 mEq/L (ref 19–32)
Calcium: 10.3 mg/dL (ref 8.4–10.5)
Chloride: 100 mEq/L (ref 96–112)
Creatinine, Ser: 0.97 mg/dL (ref 0.40–1.20)
GFR: 58.51 mL/min — ABNORMAL LOW (ref >60.00)
Glucose, Bld: 199 mg/dL — ABNORMAL HIGH (ref 70–99)
Potassium: 3.9 mEq/L (ref 3.5–5.1)
Sodium: 137 mEq/L (ref 135–145)

## 2017-06-26 MED ORDER — HYDROCHLOROTHIAZIDE 25 MG PO TABS: 25.0000 mg | ORAL_TABLET | Freq: Every day | ORAL | 5 refills | Status: DC

## 2017-06-26 NOTE — Progress Notes (Signed)
Pre visit review using our clinic review tool, if applicable. No additional management support is needed unless otherwise documented below in the visit note. 

## 2017-06-26 NOTE — Progress Notes (Signed)
Subjective:    Patient ID: Patricia Moore, female    DOB: 12/30/1933, 84 y.o.   MRN: [id number]  DOS:  06/26/2017 Type of visit - description : f/u Interval history: Since the last office visit, potassium was checked and was elevated, eventually losartan was stopped and she started amlodipine. Ambulatory BPs range from 120-130s.  Diastolic is always in the 70s.  Wt Readings from Last 3 Encounters:  06/26/17 147 lb 2 oz (66.7 kg)  05/28/17 144 lb 2 oz (65.4 kg)  05/15/17 139 lb 8 oz (63.3 kg)     Review of Systems  Reports her usual lower extremity edema which was mild, has been more noticeable in the last 2 weeks.  She is using compression stockings and is controlling it for the most part Denies chest pain, difficulty breathing or orthopnea.  Past Medical History:  Diagnosis Date  . Allergy   . Anxiety 12/09/2013  . Arthritis   . Back pain   . Diabetes mellitus   . Diverticulitis    patient states has had three attacks   . Diverticulosis   . DJD (degenerative joint disease) 12/14/2014  . H. pylori infection 2001  . Heart murmur   . Hx of adenomatous colonic polyps   . Hyperlipidemia   . Hypertension   . Intraductal papilloma    bx nef x 2 in the 70s-90s  . Osteoporosis   . Saccular aneurysm   . SCC (squamous cell carcinoma) 03/2011   sees dermatology    Past Surgical History:  Procedure Laterality Date  . BREAST BIOPSY  '91, '95   INTRADUCTAL PAPILLOMA  . BREAST LUMPECTOMY    . CATARACT EXTRACTION Bilateral 09/2014    Social History   Socioeconomic History  . Marital status: Married    Spouse name: Not on file  . Number of children: 1  . Years of education: Not on file  . Highest education level: Not on file  Social Needs  . Financial resource strain: Not on file  . Food insecurity - worry: Not on file  . Food insecurity - inability: Not on file  . Transportation needs - medical: Not on file  . Transportation needs - non-medical: Not on file    Occupational History  . 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 Smoking status: Never Smoker  . Smokeless tobacco: Never Used  Substance and Sexual Activity  . Alcohol use: Yes    Alcohol/week: 0.6 oz    Types: 1 Glasses of wine per week    Comment: OCCASIONALLY  . Drug use: No  . Sexual activity: No  Other Topics Concern  . Not on file  Social History Narrative   Born in [place], ,moved to the [place] in the 60s, live in [place] then in [place]   Lives w/ husband   H.S. Buyer, retail   Son lives in [place])       Allergies as of 06/26/2017   No Known Allergies     Medication List        Accurate as of 06/26/17 11:59 PM. Always use your most recent med list.          ALPRAZolam 0.25 MG tablet Commonly known as:  XANAX TAKE 1 TABLET BY MOUTH DAILY AS NEEDED   amLODipine 5 MG tablet Commonly known as:  NORVASC Take 1 tablet (5 mg total) by mouth daily.   aspirin 81 MG tablet Take 81 mg by mouth daily.  baclofen 10 MG tablet Commonly known as:  LIORESAL Take 1 tablet by mouth as needed.   cholecalciferol 1000 units tablet Commonly known as:  VITAMIN D Take 1,000 Units by mouth daily.   co-enzyme Q-10 30 MG capsule Take 30 mg by mouth daily.   diazepam 5 MG tablet Commonly known as:  VALIUM Take 5 mg by mouth daily as needed (dizziness).   dorzolamide-timolol 22.3-6.8 MG/ML ophthalmic solution Commonly known as:  COSOPT Place 1 drop into both eyes 2 (two) times daily.   folic acid 1 MG tablet Commonly known as:  FOLVITE Take 1 tablet (1 mg total) by mouth daily.   glucose blood test strip Commonly known as:  ONE TOUCH ULTRA TEST CHECK BLOOD SUGAR NO MORE THAN 2 TIMES DAILY   glyBURIDE 2.5 MG tablet Commonly known as:  DIABETA Take 1 tablet (2.5 mg total) by mouth 2 (two) times daily with a meal.   hydrochlorothiazide 25 MG tablet Commonly known as:  HYDRODIURIL Take 1 tablet (25 mg total) by mouth daily.   meclizine 25 MG tablet Commonly  known as:  ANTIVERT Take 1 tablet (25 mg total) by mouth 3 (three) times daily as needed for dizziness or nausea.   metFORMIN 1000 MG tablet Commonly known as:  GLUCOPHAGE TAKE 1 TABLET BY MOUTH WITH BREAKFAST, 1 TABLET WITH LUNCH AND 1/2 TABLET WITH DINNER DAILY   onetouch ultrasoft lancets CHECK BLOOD SUGAR NO MORE THAN TWICE DAILY          Objective:   Physical Exam BP (!) 142/82 (BP Location: Left Arm, Patient Position: Sitting, Cuff Size: Small)   Pulse 99   Temp 97.9 F (36.6 C) (Oral)   Resp 14   Ht 5\' 2"  (1.575 m)   Wt 147 lb 2 oz (66.7 kg)   LMP 04/17/1988   SpO2 97%   BMI 26.91 kg/m  General:   Well developed, well nourished . NAD.  HEENT:  Normocephalic . Face symmetric, atraumatic Neck: No JVD Lungs:  CTA B Normal respiratory effort, no intercostal retractions, no accessory muscle use. Heart: RRR,  no murmur.  No pretibial or peri-ankle edema bilaterally  Skin: Not pale. Not jaundice Neurologic:  alert & oriented X3.  Speech normal, gait appropriate for age and unassisted Psych--  Cognition and judgment appear intact.  Cooperative with normal attention span and concentration.  Behavior appropriate. No anxious or depressed appearing.      Assessment & Plan:    Assessment  DM HTN Hyperlipidemia -- intolerant to lipitor and crestor (severe cramps) as off 01-2016  Anxiety- xanax rx by pcp Vertigo - valium prn GI: --Recurrent diverticulitis (one episode documented by a CT 11-2014) --colonoscopy 10-2013, Dr. Marina Goodell, + polyps, melanosis coli, tics --H. pylori 2001 DJD--back pain, knees, neck pain ( on baclofen for neck) Osteoporosis  SCC, skin cancer Elevated homocysteine Raynaud's phenomena  NEURO --Carotid US 10-2013: No significant plaque, although ICA's are serpentine, bilaterally. 40-59% bilateral ICA stenosis by velocity criteria likely due to tortuosity. Patent vertebral arteries with antegrade flow.Normal subclavian arteries,  bilaterally. -- Transinet dizziness, diplopia sx late 2016 , saw neuro >>> Dx w/ TIA/Stroke on clinical grounds  W/u:  Brain  MRI 03-2015 >> 2 mm saccular aneurysm at the right posterior communicating artery origin, saw  Neurology, Rx to recheck MRI 1 year, ok continue ASA per neuro CTA head, neck 05-14-2015: aneurysm confirmed, otherwise (-) --brain aneurysm see above, saw neuro 2018, consider repeat imagine 2020 --Admitted 07-2015, dizziness, MRI negative, DX  labyrinthitis    PLAN: HTN: Since the last visit, potassium was elevated, losartan dose decreased and eventually stopped last month due to a potassium of 5.4. amlodipine was started 05-25-17 thus  currently on HCTZ, amlodipine; amb BPs ok.   Due to LE edema in the afternoons and mild weight gain we talk about possibly switch amlodipine to something else (carvedilol?)  But we both agree on observation.   Will check a BMP, if hyperkalemia persists will consider further eval. Pt likes a prompt re-eval, RTC 6 weeks

## 2017-06-26 NOTE — Patient Instructions (Signed)
GO TO THE LAB : Get the blood work     GO TO THE FRONT DESK Schedule your next appointment for a checkup in 6 weeks   Check the  blood pressure 2 or 3 times a   week   Be sure your blood pressure is between 110/65 and  135/85. If it is consistently higher or lower, let me know

## 2017-06-27 NOTE — Assessment & Plan Note (Signed)
HTN: Since the last visit, potassium was elevated, losartan dose decreased and eventually stopped last month due to a potassium of 5.4. amlodipine was started 05-25-17 thus  currently on HCTZ, amlodipine; amb BPs ok.   Due to LE edema in the afternoons and mild weight gain we talk about possibly switch amlodipine to something else (carvedilol?)  But we both agree on observation.   Will check a BMP, if hyperkalemia persists will consider further eval. Pt likes a prompt re-eval, RTC 6 weeks

## 2017-07-05 DIAGNOSIS — H401121 Primary open-angle glaucoma, left eye, mild stage: Secondary | ICD-10-CM | POA: Diagnosis not present

## 2017-07-05 DIAGNOSIS — H4089 Other specified glaucoma: Secondary | ICD-10-CM | POA: Diagnosis not present

## 2017-07-05 DIAGNOSIS — H43812 Vitreous degeneration, left eye: Secondary | ICD-10-CM | POA: Diagnosis not present

## 2017-07-05 DIAGNOSIS — H26493 Other secondary cataract, bilateral: Secondary | ICD-10-CM | POA: Diagnosis not present

## 2017-07-05 DIAGNOSIS — H527 Unspecified disorder of refraction: Secondary | ICD-10-CM | POA: Diagnosis not present

## 2017-07-05 DIAGNOSIS — E119 Type 2 diabetes mellitus without complications: Secondary | ICD-10-CM | POA: Diagnosis not present

## 2017-07-05 LAB — HM DIABETES EYE EXAM

## 2017-07-24 ENCOUNTER — Ambulatory Visit: Payer: Medicare Other | Admitting: Neurology

## 2017-07-24 ENCOUNTER — Encounter: Payer: Self-pay | Admitting: Neurology

## 2017-07-24 VITALS — BP 150/80 | HR 84 | Ht 62.0 in | Wt 144.2 lb

## 2017-07-24 DIAGNOSIS — H8110 Benign paroxysmal vertigo, unspecified ear: Secondary | ICD-10-CM

## 2017-07-24 DIAGNOSIS — I10 Essential (primary) hypertension: Secondary | ICD-10-CM

## 2017-07-24 DIAGNOSIS — I671 Cerebral aneurysm, nonruptured: Secondary | ICD-10-CM | POA: Diagnosis not present

## 2017-07-24 DIAGNOSIS — R51 Headache: Secondary | ICD-10-CM

## 2017-07-24 DIAGNOSIS — R519 Headache, unspecified: Secondary | ICD-10-CM

## 2017-07-24 NOTE — Patient Instructions (Addendum)
1. We will repeat CTA of head to follow up on aneurysm

## 2017-07-24 NOTE — Progress Notes (Signed)
NEUROLOGY FOLLOW UP OFFICE NOTE  Patricia Moore [id number]  HISTORY OF PRESENT ILLNESS: Patricia Moore is an 84 year old right-handed female with hypertension, type 2 diabetes mellitus, hyperlipidemia, DJD and arthritis who follows up for dizziness and cervical neck pain.   UPDATE: She has had two episodes of peripheral vertigo, which is treated by ENT with meclizine and Valium.  For several months, she has had other dizziness.  It occurs when she bends over.  It feels like she is "on a boat".  She denies visual disturbance, nausea, unilateral numbness and weakness.  Over the past few weeks, it feels like a pounding in her head but it is not painful.  Over the past several months, she reports fluctuation in blood pressure.  Losartan was changed to amlodipine.  She also has had mild hyperkalemia but last K level a couple of weeks ago was normal.  She had a cardiac workup last year, including stress test, echo and Holter, which were unremarkable.    She still has mild left sided neck pain radiating into the shoulder.  When it is aggravated, she takes the baclofen.   HISTORY: Since June 2016, she has been experiencing severe right-sided posterior neck pain that radiates down to the right shoulder.  It does not radiate into the arm.  She denies numbness or tingling of the right upper extremity or back of the head.  It is painful with neck movement.  Applying pressure is helpful.  Pain is worse later in the day.  Over the past couple of months, she had two spells of dizziness.  She woke up from sleep and noted double vision and severe spinning lasting a couple of minutes.  She just closed her eyes and was still until it resolved.  She denied slurred speech, focal numbness or weakness or headache.  She woke up on her back but reports that she turns side to side when she sleeps.  It happened one other time.  Since then, she still feels unsteady when she walks.  She has mild dizziness when she bends over.  She  denies prior history of similar spells.  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 To evaluate neck pain, X-ray of cervical spine performed on 02/15/15 showed moderate multilevel degenerative disc and facet joint disease with straightening of the cervical lordosis, indicative of muscle spasm.  She has previously tried cyclobenzaprine and tizanidine.   She has had episodes of dizziness.  She woke up from sleep and noted double vision and severe spinning lasting a couple of minutes.  She just closed her eyes and was still until it resolved.  She denied slurred speech, focal numbness or weakness or headache.  She woke up on her back but reports that she turns side to side when she sleeps.  It happened one other time.  Since then, she still feels unsteady when she walks.  She has mild dizziness when she bends over.  She denies prior history of similar spells.  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 To evaluate neck pain, X-ray of cervical spine performed on 02/15/15 showed moderate multilevel degenerative disc and facet joint disease with straightening of the cervical lordosis, indicative of muscle spasm.  MRA of head from 04/05/15 demonstrated no correlating findings to the cerebellar legion seen on prior MRI.  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  She was evaluated by neurosurgery who did  not feel that the pcomm aneurysm or cavernoma required intervention.  To follow up pcomm aneurysm, a repeat MRA of head was performed on 06/13/16, which again demonstrated a 12mm outpouching at the right posterior communicating artery, which may be an aneurysm or potentially could represent infundibulum.   On the morning of 05/02/15, she woke up and felt dizzy.  She also reported horizontal double vision.  Her left eye was blood-red.  She reported slight headache but no slurred speech, gait instability or focal numbness or weakness. Diplopia lasted  one to two days.  She saw the eye doctor who told her she did have "blood in the back" of her eye, perhaps having slept on it.    CT head and CTA of head and neck from 05/14/15 revealed stable small 2-3 mm right Pcomm.  She was referred to endovascular surgery for evaluation.  She was evaluated by neurosurgery who did not feel that the pcomm aneurysm or cavernoma required intervention.  Due to possible posterior circulation TIA (vertigo with diplopia), she was started on ASA 81mg  daily for secondary stroke prevention She presented to [facility] on 07/30/15 for another episode of persistent vertigo, nausea and vomiting.  CT of head showed no acute findings.  MRI of brain was normal.  She was given low-dose steroids for possible labyrnthitis and hydration.  She followed up with her ENT who concurred that she had an inner ear dysfunction.  She was prescribed Valium and meclizine.  PAST MEDICAL HISTORY: Past Medical History:  Diagnosis Date  . Allergy   . Anxiety 12/09/2013  . Arthritis   . Back pain   . Diabetes mellitus   . Diverticulitis    patient states has had three attacks   . Diverticulosis   . DJD (degenerative joint disease) 12/14/2014  . H. pylori infection 2001  . Heart murmur   . Hx of adenomatous colonic polyps   . Hyperlipidemia   . Hypertension   . Intraductal papilloma    bx nef x 2 in the 70s-90s  . Osteoporosis   . Saccular aneurysm   . SCC (squamous cell carcinoma) 03/2011   sees dermatology    MEDICATIONS: Current Outpatient Medications on File Prior to Visit  Medication Sig Dispense Refill  . ALPRAZolam (XANAX) 0.25 MG tablet TAKE 1 TABLET BY MOUTH DAILY AS NEEDED 30 tablet 1  . amLODipine (NORVASC) 5 MG tablet Take 1 tablet (5 mg total) by mouth daily. 30 tablet 6  . aspirin 81 MG tablet Take 81 mg by mouth daily.    . baclofen (LIORESAL) 10 MG tablet Take 1 tablet by mouth as needed.    . cholecalciferol (VITAMIN D) 1000 UNITS tablet Take 1,000 Units by mouth daily.     Marland Kitchen co-enzyme Q-10 30 MG capsule Take 30 mg by mouth daily.    . diazepam (VALIUM) 5 MG tablet Take 5 mg by mouth daily as needed (dizziness).    . dorzolamide-timolol (COSOPT) 22.3-6.8 MG/ML ophthalmic solution Place 1 drop into both eyes 2 (two) times daily.     . folic acid (FOLVITE) 1 MG tablet Take 1 tablet (1 mg total) by mouth daily. 30 tablet 12  . glucose blood (ONE TOUCH ULTRA TEST) test strip CHECK BLOOD SUGAR NO MORE THAN 2 TIMES DAILY 200 each 12  . glyBURIDE (DIABETA) 2.5 MG tablet Take 1 tablet (2.5 mg total) by mouth 2 (two) times daily with a meal. 60 tablet 5  . hydrochlorothiazide (HYDRODIURIL) 25 MG tablet Take 1  tablet (25 mg total) by mouth daily. 30 tablet 5  . Lancets (ONETOUCH ULTRASOFT) lancets CHECK BLOOD SUGAR NO MORE THAN TWICE DAILY 100 each 12  . meclizine (ANTIVERT) 25 MG tablet Take 1 tablet (25 mg total) by mouth 3 (three) times daily as needed for dizziness or nausea. 30 tablet 0  . metFORMIN (GLUCOPHAGE) 1000 MG tablet TAKE 1 TABLET BY MOUTH WITH BREAKFAST, 1 TABLET WITH LUNCH AND 1/2 TABLET WITH DINNER DAILY 75 tablet 5   No current facility-administered medications on file prior to visit.     ALLERGIES: No Known Allergies  FAMILY HISTORY: Family History  Adopted: Yes  Problem Relation Age of Onset  . Heart disease Mother 80  . Heart disease Father 43  . Breast cancer Maternal Aunt   . Stroke Maternal Aunt   . Colon cancer Paternal Uncle        7 uncles  . Colon cancer Maternal Grandfather        COLON,, FAMILY HX 7 MEMBERS  . Breast cancer Maternal Aunt   . Cancer Sister        skin  . Hypertension Neg Hx   . Diabetes Neg Hx     SOCIAL HISTORY: Social History   Socioeconomic History  . Marital status: Married    Spouse name: Not on file  . Number of children: 1  . Years of education: Not on file  . Highest education level: Not on file  Occupational History  . Occupation: retired     Fish farm manager: [employer]  Social Needs  . Financial  resource strain: Not on file  . Food insecurity:    Worry: Not on file    Inability: Not on file  . Transportation needs:    Medical: Not on file    Non-medical: Not on file  Tobacco Use  . Smoking status: Never Smoker  . Smokeless tobacco: Never Used  Substance and Sexual Activity  . Alcohol use: Yes    Alcohol/week: 0.6 oz    Types: 1 Glasses of wine per week    Comment: OCCASIONALLY  . Drug use: No  . Sexual activity: Never  Lifestyle  . Physical activity:    Days per week: Not on file    Minutes per session: Not on file  . Stress: Not on file  Relationships  . Social connections:    Talks on phone: Not on file    Gets together: Not on file    Attends religious service: Not on file    Active member of club or organization: Not on file    Attends meetings of clubs or organizations: Not on file    Relationship status: Not on file  . Intimate partner violence:    Fear of current or ex partner: Not on file    Emotionally abused: Not on file    Physically abused: Not on file    Forced sexual activity: Not on file  Other Topics Concern  . 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 [place])     REVIEW OF SYSTEMS: Constitutional: No fevers, chills, or sweats, no generalized fatigue, change in appetite Eyes: No visual changes, double vision, eye pain Ear, nose and throat: No hearing loss, ear pain, nasal congestion, sore throat Cardiovascular: No chest pain, palpitations Respiratory:  No shortness of  breath at rest or with exertion, wheezes GastrointestinaI: No nausea, vomiting, diarrhea, abdominal pain, fecal incontinence Genitourinary:  No dysuria, urinary retention or frequency Musculoskeletal:  No neck pain, back pain Integumentary: No rash, pruritus, skin lesions Neurological: as above Psychiatric: No depression, insomnia, anxiety Endocrine: No  palpitations, fatigue, diaphoresis, mood swings, change in appetite, change in weight, increased thirst Hematologic/Lymphatic:  No purpura, petechiae. Allergic/Immunologic: no itchy/runny eyes, nasal congestion, recent allergic reactions, rashes  PHYSICAL EXAM: Vitals:   07/24/17 0854  BP: (!) 150/80  Pulse: 84  SpO2: 98%   General: No acute distress.  Patient appears well-groomed.   Head:  Normocephalic/atraumatic Eyes:  Fundi examined but not visualized Neck: supple, no paraspinal tenderness, full range of motion Heart:  Regular rate and rhythm Lungs:  Clear to auscultation bilaterally Back: No paraspinal tenderness Neurological Exam: alert and oriented to person, place, and time. Attention span and concentration intact, recent and remote memory intact, fund of knowledge intact.  Speech fluent and not dysarthric, language intact.  CN II-XII intact. Bulk and tone normal, muscle strength 5/5 throughout.  Sensation to light touch  intact.  Deep tendon reflexes 2+ throughout.  Finger to nose testing intact.  Gait normal, Romberg negative.  IMPRESSION: 1.  Benign paroxysmal positional vertigo 2.  Dizziness, unspecified.  Possibly related to fluctuation in blood pressure or component of BPPV. 3.  PCOM aneurysm.  As she is having new pounding sensation in her head, this needs to be re-evaluated. 4.  Cervicalgia, musculoskeletal 5.  HTN  PLAN: 1.  We will get CTA of head to evaluate for change in aneurysm 2.  Continue management of BPPV as per ENT  3.  Baclofen as needed for neck pain as per PCP 4.  Follow up with Dr. Larose Kells regarding blood pressure  25 minutes spent face to face with patient, over 50% spent discussing management.  Metta Clines, DO  CC:  Dr. Larose Kells

## 2017-07-30 ENCOUNTER — Encounter: Payer: Self-pay | Admitting: Internal Medicine

## 2017-07-30 ENCOUNTER — Ambulatory Visit (INDEPENDENT_AMBULATORY_CARE_PROVIDER_SITE_OTHER): Payer: Medicare Other | Admitting: Internal Medicine

## 2017-07-30 VITALS — BP 136/78 | HR 71 | Temp 98.1°F | Resp 16 | Ht 62.0 in | Wt 143.0 lb

## 2017-07-30 DIAGNOSIS — I10 Essential (primary) hypertension: Secondary | ICD-10-CM | POA: Diagnosis not present

## 2017-07-30 MED ORDER — CARVEDILOL 6.25 MG PO TABS: 6.2500 mg | ORAL_TABLET | Freq: Two times a day (BID) | ORAL | 3 refills | Status: DC

## 2017-07-30 NOTE — Progress Notes (Signed)
Pre visit review using our clinic review tool, if applicable. No additional management support is needed unless otherwise documented below in the visit note. 

## 2017-07-30 NOTE — Patient Instructions (Addendum)
  GO TO THE FRONT DESK Schedule your next appointment for a checkup in 6 weeks    Continue hydrochlorothiazide  Do not go back on amlodipine  Start carvedilol twice a day    Check the  blood pressure 2 or 3 times a   week   Be sure your blood pressure is between 110/65 and  135/85. If it is consistently higher or lower, let me know

## 2017-07-30 NOTE — Progress Notes (Signed)
Subjective:    Patient ID: Patricia Moore, female    DOB: 1933/11/15, 84 y.o.   MRN: [id number]  DOS:  07/30/2017 Type of visit - description : Follow-up Interval history: Since the last office visit, she was taking HCTZ and amlodipine regularly. BPs at the pharmacy usually in the 140s, BP at the neurologist was 150/80. She ran out of BP meds 3 days ago and decided not to take them any longer because "it was not controlling my blood pressure".   Wt Readings from Last 3 Encounters:  07/30/17 143 lb (64.9 kg)  07/24/17 144 lb 4 oz (65.4 kg)  06/26/17 147 lb 2 oz (66.7 kg)    Review of Systems Denies chest pain or difficulty breathing. Lower extremity edema much improved in the last 3 days since she stopped amlodipine. Occasional palpitations, mild, brief, no associated with other symptoms such as syncope.  Past Medical History:  Diagnosis Date  . Allergy   . Anxiety 12/09/2013  . Arthritis   . Back pain   . Diabetes mellitus   . Diverticulitis    patient states has had three attacks   . Diverticulosis   . DJD (degenerative joint disease) 12/14/2014  . H. pylori infection 2001  . Heart murmur   . Hx of adenomatous colonic polyps   . Hyperlipidemia   . Hypertension   . Intraductal papilloma    bx nef x 2 in the 70s-90s  . Osteoporosis   . Saccular aneurysm   . SCC (squamous cell carcinoma) 03/2011   sees dermatology    Past Surgical History:  Procedure Laterality Date  . BREAST BIOPSY  '91, '95   INTRADUCTAL PAPILLOMA  . BREAST LUMPECTOMY    . CATARACT EXTRACTION Bilateral 09/2014    Social History   Socioeconomic History  . Marital status: Married    Spouse name: Not on file  . Number of children: 1  . Years of education: Not on file  . Highest education level: Not on file  Occupational History  . Occupation: retired     Associate Professor: [employer]  Social Needs  . Financial resource strain: Not on file  . Food insecurity:    Worry: Not on file    Inability:  Not on file  . Transportation needs:    Medical: Not on file    Non-medical: Not on file  Tobacco Use  . Smoking status: Never Smoker  . Smokeless tobacco: Never Used  Substance and Sexual Activity  . Alcohol use: Yes    Alcohol/week: 0.6 oz    Types: 1 Glasses of wine per week    Comment: OCCASIONALLY  . Drug use: No  . Sexual activity: Never  Lifestyle  . Physical activity:    Days per week: Not on file    Minutes per session: Not on file  . Stress: Not on file  Relationships  . Social connections:    Talks on phone: Not on file    Gets together: Not on file    Attends religious service: Not on file    Active member of club or organization: Not on file    Attends meetings of clubs or organizations: Not on file    Relationship status: Not on file  . Intimate partner violence:    Fear of current or ex partner: Not on file    Emotionally abused: Not on file    Physically abused: Not on file    Forced sexual activity: Not on file  Other  Topics Concern  . Not on file  Social History Narrative   Born in [place], ,moved to the [place] in the 60s, live in [place] then in [place]   Lives w/ husband   H.S. Buyer, retail   Son lives in [place])       Allergies as of 07/30/2017   No Known Allergies     Medication List        Accurate as of 07/30/17 11:14 AM. Always use your most recent med list.          ALPRAZolam 0.25 MG tablet Commonly known as:  XANAX TAKE 1 TABLET BY MOUTH DAILY AS NEEDED   aspirin 81 MG tablet Take 81 mg by mouth daily.   baclofen 10 MG tablet Commonly known as:  LIORESAL Take 1 tablet by mouth as needed.   carvedilol 6.25 MG tablet Commonly known as:  COREG Take 1 tablet (6.25 mg total) by mouth 2 (two) times daily with a meal.   cholecalciferol 1000 units tablet Commonly known as:  VITAMIN D Take 1,000 Units by mouth daily.   co-enzyme Q-10 30 MG capsule Take 30 mg by mouth daily.   diazepam 5 MG tablet Commonly known as:  VALIUM Take 5 mg by  mouth daily as needed (dizziness).   dorzolamide-timolol 22.3-6.8 MG/ML ophthalmic solution Commonly known as:  COSOPT Place 1 drop into both eyes 2 (two) times daily.   folic acid 1 MG tablet Commonly known as:  FOLVITE Take 1 tablet (1 mg total) by mouth daily.   glucose blood test strip Commonly known as:  ONE TOUCH ULTRA TEST CHECK BLOOD SUGAR NO MORE THAN 2 TIMES DAILY   glyBURIDE 2.5 MG tablet Commonly known as:  DIABETA Take 1 tablet (2.5 mg total) by mouth 2 (two) times daily with a meal.   hydrochlorothiazide 25 MG tablet Commonly known as:  HYDRODIURIL Take 1 tablet (25 mg total) by mouth daily.   meclizine 25 MG tablet Commonly known as:  ANTIVERT Take 1 tablet (25 mg total) by mouth 3 (three) times daily as needed for dizziness or nausea.   metFORMIN 1000 MG tablet Commonly known as:  GLUCOPHAGE TAKE 1 TABLET BY MOUTH WITH BREAKFAST, 1 TABLET WITH LUNCH AND 1/2 TABLET WITH DINNER DAILY   onetouch ultrasoft lancets CHECK BLOOD SUGAR NO MORE THAN TWICE DAILY          Objective:   Physical Exam BP 136/78 (BP Location: Left Arm, Patient Position: Sitting, Cuff Size: Small)   Pulse 71   Temp 98.1 F (36.7 C) (Oral)   Resp 16   Ht 5\' 2"  (1.575 m)   Wt 143 lb (64.9 kg)   LMP 04/17/1988   SpO2 98%   BMI 26.16 kg/m  General:   Well developed, well nourished . NAD.  HEENT:  Normocephalic . Face symmetric, atraumatic Lungs:  CTA B Normal respiratory effort, no intercostal retractions, no accessory muscle use. Heart: RRR,  no murmur.  No pretibial edema bilaterally  Skin: Not pale. Not jaundice Neurologic:  alert & oriented X3.  Speech normal, gait appropriate for age and unassisted Psych--  Cognition and judgment appear intact.  Cooperative with normal attention span and concentration.  Behavior appropriate. No anxious or depressed appearing.      Assessment & Plan:   Assessment  DM HTN -Losartan d/c d/t hyperkalemia -Amlodipine d/c d/t  edema 07-2017 Hyperlipidemia -- intolerant to lipitor and crestor (severe cramps) as off 01-2016  Anxiety- xanax rx by pcp Vertigo -  valium prn GI: --Recurrent diverticulitis (one episode documented by a CT 11-2014) --colonoscopy 10-2013, Dr. Marina Goodell, + polyps, melanosis coli, tics --H. pylori 2001 DJD--back pain, knees, neck pain ( on baclofen for neck) Osteoporosis  SCC, skin cancer Elevated homocysteine Raynaud's phenomena  NEURO --Carotid US 10-2013: No significant plaque, although ICA's are serpentine, bilaterally. 40-59% bilateral ICA stenosis by velocity criteria likely due to tortuosity. Patent vertebral arteries with antegrade flow.Normal subclavian arteries, bilaterally. -- Transinet dizziness, diplopia sx late 2016 , saw neuro >>> Dx w/ TIA/Stroke on clinical grounds  W/u:  Brain  MRI 03-2015 >> 2 mm saccular aneurysm at the right posterior communicating artery origin, saw  Neurology, Rx to recheck MRI 1 year, ok continue ASA per neuro CTA head, neck 05-14-2015: aneurysm confirmed, otherwise (-) --brain aneurysm see above, saw neuro 2018, consider repeat imagine 2020 --Admitted 07-2015, dizziness, MRI negative, DX labyrinthitis    PLAN: HTN: Since the last visit, was on HCTZ and amlodipine, ambulatory BPs 140, 150, run out of both medications 3 days ago, edema is completely resolved, BP today is still  okay. Suspect edema was trigger or made worse by amlodipine, plan: Restart HCTZ, DC amlodipine, start carvedilol.  Monitor BPs, see AVS. RTC 6 weeks.

## 2017-07-31 NOTE — Assessment & Plan Note (Signed)
HTN: Since the last visit, was on HCTZ and amlodipine, ambulatory BPs 140, 150, run out of both medications 3 days ago, edema is completely resolved, BP today is still  okay. Suspect edema was trigger or made worse by amlodipine, plan: Restart HCTZ, DC amlodipine, start carvedilol.  Monitor BPs, see AVS. RTC 6 weeks.

## 2017-08-02 ENCOUNTER — Encounter: Payer: Self-pay | Admitting: Obstetrics & Gynecology

## 2017-08-02 ENCOUNTER — Ambulatory Visit: Payer: Medicare Other | Admitting: Obstetrics & Gynecology

## 2017-08-02 DIAGNOSIS — L739 Follicular disorder, unspecified: Secondary | ICD-10-CM | POA: Diagnosis not present

## 2017-08-02 MED ORDER — CLINDAMYCIN PHOSPHATE 1 % EX GEL: Freq: Two times a day (BID) | CUTANEOUS | 1 refills | Status: AC

## 2017-08-02 NOTE — Progress Notes (Signed)
Patricia Moore 09-01-1933 [id number]        84 y.o.  G2P0011   RP: Right pubic rash  HPI: Had an ingrown hair abscess that drained a few days ago after warm soaking.  After that about 3 days ago developed a rash in the same area at the right pubic inguinal area.  The rash is in the form of many small red pimples with mild itchiness.   OB History  Gravida Para Term Preterm AB Living  2 1     1 1   SAB TAB Ectopic Multiple Live Births  1            # Outcome Date GA Lbr Len/2nd Weight Sex Delivery Anes PTL Lv  2 SAB           1 Para             Past medical history,surgical history, problem list, medications, allergies, family history and social history were all reviewed and documented in the EPIC chart.   Directed ROS with pertinent positives and negatives documented in the history of present illness/assessment and plan.  Exam:  There were no vitals filed for this visit. General appearance:  Normal  Abdomen: Normal  Gynecologic exam: Right pubic area and inguinal area with folliculitis.  Sebasto's gland abscess healing at the lower right inguinal area.   Assessment/Plan:  84 y.o. G2P0011   1. Acute folliculitis Patient prescribed clindamycin gel to apply twice a day for 7 days.  Also recommended to do warm soaking.  Avoid irritants.  Avoid scratching and shaving.  We will follow-up in June for her annual gynecologic exam.  Other orders - clindamycin (CLINDAGEL) 1 % gel; Apply topically 2 (two) times daily for 7 days.  Counseling on above issues and coordination of care more than 50% for 15 minutes.  [name] MD, 11:14 AM 08/02/2017

## 2017-08-02 NOTE — Patient Instructions (Signed)
1. Acute folliculitis Patient prescribed clindamycin gel to apply twice a day for 7 days.  Also recommended to do warm soaking.  Avoid irritants.  Avoid scratching and shaving.  We will follow-up in June for her annual gynecologic exam.  Other orders - clindamycin (CLINDAGEL) 1 % gel; Apply topically 2 (two) times daily for 7 days.  Patricia Moore, it was a pleasure meeting you today!   Folliculitis Folliculitis is inflammation of the hair follicles. Folliculitis most commonly occurs on the scalp, thighs, legs, back, and buttocks. However, it can occur anywhere on the body. What are the causes? This condition may be caused by:  A bacterial infection (common).  A fungal infection.  A viral infection.  Coming into contact with certain chemicals, especially oils and tars.  Shaving or waxing.  Applying greasy ointments or creams to your skin often.  Long-lasting folliculitis and folliculitis that keeps coming back can be caused by bacteria that live in the nostrils. What increases the risk? This condition is more likely to develop in people with:  A weakened immune system.  Diabetes.  Obesity.  What are the signs or symptoms? Symptoms of this condition include:  Redness.  Soreness.  Swelling.  Itching.  Small white or yellow, pus-filled, itchy spots (pustules) that appear over a reddened area. If there is an infection that goes deep into the follicle, these may develop into a boil (furuncle).  A group of closely packed boils (carbuncle). These tend to form in hairy, sweaty areas of the body.  How is this diagnosed? This condition is diagnosed with a skin exam. To find what is causing the condition, your health care provider may take a sample of one of the pustules or boils for testing. How is this treated? This condition may be treated by:  Applying warm compresses to the affected areas.  Taking an antibiotic medicine or applying an antibiotic medicine to the  skin.  Applying or bathing with an antiseptic solution.  Taking an over-the-counter medicine to help with itching.  Having a procedure to drain any pustules or boils. This may be done if a pustule or boil contains a lot of pus or fluid.  Laser hair removal. This may be done to treat long-lasting folliculitis.  Follow these instructions at home:  If directed, apply heat to the affected area as often as told by your health care provider. Use the heat source that your health care provider recommends, such as a moist heat pack or a heating pad. ? Place a towel between your skin and the heat source. ? Leave the heat on for 20-30 minutes. ? Remove the heat if your skin turns bright red. This is especially important if you are unable to feel pain, heat, or cold. You may have a greater risk of getting burned.  If you were prescribed an antibiotic medicine, use it as told by your health care provider. Do not stop using the antibiotic even if you start to feel better.  Take over-the-counter and prescription medicines only as told by your health care provider.  Do not shave irritated skin.  Keep all follow-up visits as told by your health care provider. This is important. Get help right away if:  You have more redness, swelling, or pain in the affected area.  Red streaks are spreading from the affected area.  You have a fever. This information is not intended to replace advice given to you by your health care provider. Make sure you discuss any questions you have  with your health care provider. Document Released: 06/12/2001 Document Revised: 10/22/2015 Document Reviewed: 01/22/2015 Elsevier Interactive Patient Education  2018 [employer].

## 2017-08-07 ENCOUNTER — Encounter: Payer: Self-pay | Admitting: Internal Medicine

## 2017-08-07 ENCOUNTER — Ambulatory Visit: Payer: Medicare Other | Admitting: Internal Medicine

## 2017-08-08 ENCOUNTER — Ambulatory Visit: Payer: Medicare Other | Admitting: Internal Medicine

## 2017-08-13 ENCOUNTER — Telehealth: Payer: Self-pay

## 2017-08-13 ENCOUNTER — Ambulatory Visit
Admission: RE | Admit: 2017-08-13 | Discharge: 2017-08-13 | Disposition: A | Discharge status: Home / Self Care | Payer: Medicare Other | Source: Ambulatory Visit | Attending: Neurology | Admitting: Neurology

## 2017-08-13 DIAGNOSIS — R51 Headache: Principal | ICD-10-CM

## 2017-08-13 DIAGNOSIS — I671 Cerebral aneurysm, nonruptured: Secondary | ICD-10-CM

## 2017-08-13 DIAGNOSIS — R519 Headache, unspecified: Secondary | ICD-10-CM

## 2017-08-13 MED ORDER — IOPAMIDOL (ISOVUE-370) INJECTION 76%
75.0000 mL | Freq: Once | INTRAVENOUS | Status: AC | PRN
  Administered 2017-08-13: 75 mL via INTRAVENOUS

## 2017-08-13 NOTE — Telephone Encounter (Signed)
Called and spoke with Pt, advsd her of imaging results. Pt wanted to confirm appt on 09/03/17, arrival 10:45a. As we are changing our schedule, I advsd Pt I will check with scheduling and call her back if that appt has been changed.

## 2017-08-13 NOTE — Telephone Encounter (Signed)
-----   Message from Pieter Partridge, DO sent at 08/13/2017 10:24 AM EDT ----- Aneurysm is small and stable

## 2017-08-27 ENCOUNTER — Encounter: Payer: Self-pay | Admitting: Family

## 2017-08-27 ENCOUNTER — Ambulatory Visit (INDEPENDENT_AMBULATORY_CARE_PROVIDER_SITE_OTHER): Payer: Medicare Other | Admitting: Family

## 2017-08-27 VITALS — BP 157/58 | HR 66 | Temp 98.1°F | Resp 18 | Ht 62.0 in | Wt 140.6 lb

## 2017-08-27 DIAGNOSIS — R252 Cramp and spasm: Secondary | ICD-10-CM

## 2017-08-27 LAB — COMPREHENSIVE METABOLIC PANEL
ALT: 17 U/L (ref 0–35)
AST: 29 U/L (ref 0–37)
Albumin: 4.7 g/dL (ref 3.5–5.2)
Alkaline Phosphatase: 61 U/L (ref 39–117)
BUN: 33 mg/dL — ABNORMAL HIGH (ref 6–23)
CO2: 34 mEq/L — ABNORMAL HIGH (ref 19–32)
Calcium: 11.4 mg/dL — ABNORMAL HIGH (ref 8.4–10.5)
Chloride: 96 mEq/L (ref 96–112)
Creatinine, Ser: 1.21 mg/dL — ABNORMAL HIGH (ref 0.40–1.20)
GFR: 45.32 mL/min — ABNORMAL LOW (ref >60.00)
Glucose, Bld: 146 mg/dL — ABNORMAL HIGH (ref 70–99)
Potassium: 5.7 mEq/L — ABNORMAL HIGH (ref 3.5–5.1)
Sodium: 137 mEq/L (ref 135–145)
Total Bilirubin: 0.5 mg/dL (ref 0.2–1.2)
Total Protein: 8.8 g/dL — ABNORMAL HIGH (ref 6.0–8.3)

## 2017-08-27 LAB — CK: Total CK: 327 U/L — ABNORMAL HIGH (ref 7–177)

## 2017-08-27 LAB — MAGNESIUM: Magnesium: 2.2 mg/dL (ref 1.5–2.5)

## 2017-08-27 NOTE — Patient Instructions (Signed)
Please complete lab work prior to leaving. Drink plenty of water. Take a warm bath or shower prior to bed and stretch the muscles in your legs before bedtime. Keep your upcoming follow up appointment with Dr. Larose Kells.

## 2017-08-27 NOTE — Progress Notes (Signed)
Subjective:    Patient ID: ASIJAH HLAVATY, female    DOB: Apr 24, 1933, 84 y.o.   MRN: [id number]  HPI   Ms. Maxted is an 84 yr old female who presents today with c/o severe muscle pain/cramping in her legs. She reports that her symptoms began last night. Reports pain in bilateral ankles to lower abdomen. Reports that symptoms occurred at 5-6 PM.  Symptoms lasted until 12:30 AM. Applies sports tape, aleve and baclofen. Reports pain was so severe that she was "almost crying" reports that the musles in her anterior thighs "made a lump."  Resolved after 30 minutes of epsom salts.    Review of Systems See HPI  Past Medical History:  Diagnosis Date  . Allergy   . Anxiety 12/09/2013  . Arthritis   . Back pain   . Diabetes mellitus   . Diverticulitis    patient states has had three attacks   . Diverticulosis   . DJD (degenerative joint disease) 12/14/2014  . H. pylori infection 2001  . Heart murmur   . Hx of adenomatous colonic polyps   . Hyperlipidemia   . Hypertension   . Intraductal papilloma    bx nef x 2 in the 70s-90s  . Osteoporosis   . Saccular aneurysm   . SCC (squamous cell carcinoma) 03/2011   sees dermatology     Social History   Socioeconomic History  . Marital status: Married    Spouse name: Not on file  . Number of children: 1  . Years of education: Not on file  . Highest education level: Not on file  Occupational History  . Occupation: retired     Associate Professor: [employer]  Social Needs  . Financial resource strain: Not on file  . Food insecurity:    Worry: Not on file    Inability: Not on file  . Transportation needs:    Medical: Not on file    Non-medical: Not on file  Tobacco Use  . Smoking status: Never Smoker  . Smokeless tobacco: Never Used  Substance and Sexual Activity  . Alcohol use: Yes    Alcohol/week: 0.6 oz    Types: 1 Glasses of wine per week    Comment: OCCASIONALLY  . Drug use: No  . Sexual activity: Never  Lifestyle  . Physical  activity:    Days per week: Not on file    Minutes per session: Not on file  . Stress: Not on file  Relationships  . Social connections:    Talks on phone: Not on file    Gets together: Not on file    Attends religious service: Not on file    Active member of club or organization: Not on file    Attends meetings of clubs or organizations: Not on file    Relationship status: Not on file  . Intimate partner violence:    Fear of current or ex partner: Not on file    Emotionally abused: Not on file    Physically abused: Not on file    Forced sexual activity: Not on file  Other Topics Concern  . Not on file  Social History Narrative   Born in [place], ,moved to the [place] in the 60s, live in [place] then in [place]   Lives w/ husband   H.S. Buyer, retail   Son lives in [place])     Past Surgical History:  Procedure Laterality Date  . BREAST BIOPSY  '91, '95   INTRADUCTAL PAPILLOMA  .  BREAST LUMPECTOMY    . CATARACT EXTRACTION Bilateral 09/2014    Family History  Adopted: Yes  Problem Relation Age of Onset  . Heart disease Mother 23  . Heart disease Father 63  . Breast cancer Maternal Aunt   . Stroke Maternal Aunt   . Colon cancer Paternal Uncle        7 uncles  . Colon cancer Maternal Grandfather        COLON,, FAMILY HX 7 MEMBERS  . Breast cancer Maternal Aunt   . Cancer Sister        skin  . Hypertension Neg Hx   . Diabetes Neg Hx     No Known Allergies  Current Outpatient Medications on File Prior to Visit  Medication Sig Dispense Refill  . ALPRAZolam (XANAX) 0.25 MG tablet TAKE 1 TABLET BY MOUTH DAILY AS NEEDED 30 tablet 1  . aspirin 81 MG tablet Take 81 mg by mouth daily.    . baclofen (LIORESAL) 10 MG tablet Take 1 tablet by mouth as needed.    . carvedilol (COREG) 6.25 MG tablet Take 1 tablet (6.25 mg total) by mouth 2 (two) times daily with a meal. 60 tablet 3  . cholecalciferol (VITAMIN D) 1000 UNITS tablet Take 1,000 Units by mouth daily.    Marland Kitchen co-enzyme Q-10 30 MG  capsule Take 30 mg by mouth daily.    . diazepam (VALIUM) 5 MG tablet Take 5 mg by mouth daily as needed (dizziness).    . dorzolamide-timolol (COSOPT) 22.3-6.8 MG/ML ophthalmic solution Place 1 drop into both eyes 2 (two) times daily.     . folic acid (FOLVITE) 1 MG tablet Take 1 tablet (1 mg total) by mouth daily. 30 tablet 12  . glucose blood (ONE TOUCH ULTRA TEST) test strip CHECK BLOOD SUGAR NO MORE THAN 2 TIMES DAILY 200 each 12  . glyBURIDE (DIABETA) 2.5 MG tablet Take 1 tablet (2.5 mg total) by mouth 2 (two) times daily with a meal. 60 tablet 5  . hydrochlorothiazide (HYDRODIURIL) 25 MG tablet Take 1 tablet (25 mg total) by mouth daily. 30 tablet 5  . Lancets (ONETOUCH ULTRASOFT) lancets CHECK BLOOD SUGAR NO MORE THAN TWICE DAILY 100 each 12  . meclizine (ANTIVERT) 25 MG tablet Take 1 tablet (25 mg total) by mouth 3 (three) times daily as needed for dizziness or nausea. 30 tablet 0  . metFORMIN (GLUCOPHAGE) 1000 MG tablet TAKE 1 TABLET BY MOUTH WITH BREAKFAST, 1 TABLET WITH LUNCH AND 1/2 TABLET WITH DINNER DAILY 75 tablet 5   No current facility-administered medications on file prior to visit.     BP (!) 157/58 (BP Location: Right Arm, Cuff Size: Normal)   Pulse 66   Temp 98.1 F (36.7 C) (Oral)   Resp 18   Ht 5\' 2"  (1.575 m)   Wt 140 lb 9.6 oz (63.8 kg)   LMP 04/17/1988   SpO2 100%   BMI 25.72 kg/m       Objective:   Physical Exam  Constitutional: She appears well-developed and well-nourished.  Cardiovascular: Normal rate, regular rhythm and normal heart sounds.  No murmur heard. Pulses:      Dorsalis pedis pulses are 2+ on the right side, and 2+ on the left side.       Posterior tibial pulses are 2+ on the right side.  Pulmonary/Chest: Effort normal and breath sounds normal. No respiratory distress. She has no wheezes.  Musculoskeletal: She exhibits no edema.  Neurological:  Reflex Scores:  Patellar reflexes are 2+ on the right side. Bilateral LE strength is 5/5   Psychiatric: She has a normal mood and affect. Her behavior is normal. Judgment and thought content normal.          Assessment & Plan:  Leg Cramping- intermittent. Check CMET, mag, CK. Advised pt as follows:  Drink plenty of water. Take a warm bath or shower prior to bed and stretch the muscles in your legs before bedtime. Keep your upcoming follow up appointment with Dr. Drue Novel.

## 2017-08-28 ENCOUNTER — Telehealth: Payer: Self-pay | Admitting: Family

## 2017-08-28 DIAGNOSIS — R778 Other specified abnormalities of plasma proteins: Secondary | ICD-10-CM

## 2017-08-28 DIAGNOSIS — N289 Disorder of kidney and ureter, unspecified: Secondary | ICD-10-CM

## 2017-08-28 DIAGNOSIS — R779 Abnormality of plasma protein, unspecified: Secondary | ICD-10-CM

## 2017-08-28 NOTE — Telephone Encounter (Signed)
Attempted to reach pt and left message for pt to return my call. 

## 2017-08-28 NOTE — Telephone Encounter (Signed)
Pt called back and message given. Blood work appt made for Thursday 08/30/17.

## 2017-08-28 NOTE — Telephone Encounter (Signed)
Please contact pt and let her know that I reviewed her lab work. Potassium/calcium/blood protein are high, kidney function is mildly elevated. I would like her to hold HCTZ for now, drink lots of fluids and follow up on Thursday for the labs I have placed.  Important that she keep apt with Dr. Larose Kells on 5/28. He is aware of her labs and the plan.

## 2017-08-30 ENCOUNTER — Other Ambulatory Visit (INDEPENDENT_AMBULATORY_CARE_PROVIDER_SITE_OTHER): Payer: Medicare Other

## 2017-08-30 DIAGNOSIS — N289 Disorder of kidney and ureter, unspecified: Secondary | ICD-10-CM | POA: Diagnosis not present

## 2017-08-30 DIAGNOSIS — R779 Abnormality of plasma protein, unspecified: Secondary | ICD-10-CM

## 2017-08-30 LAB — CBC WITH DIFFERENTIAL/PLATELET
Basophils Absolute: 0.2 10*3/uL — ABNORMAL HIGH (ref 0.0–0.1)
Basophils Relative: 3.3 % — ABNORMAL HIGH (ref 0.0–3.0)
Eosinophils Absolute: 1.4 10*3/uL — ABNORMAL HIGH (ref 0.0–0.7)
Eosinophils Relative: 18.9 % — ABNORMAL HIGH (ref 0.0–5.0)
HCT: 40.4 % (ref 36.0–46.0)
Hemoglobin: 13.6 g/dL (ref 12.0–15.0)
Lymphocytes Relative: 28.5 % (ref 12.0–46.0)
Lymphs Abs: 2.1 10*3/uL (ref 0.7–4.0)
MCHC: 33.6 g/dL (ref 30.0–36.0)
MCV: 92.4 fl (ref 78.0–100.0)
Monocytes Absolute: 0.6 10*3/uL (ref 0.1–1.0)
Monocytes Relative: 7.9 % (ref 3.0–12.0)
Neutro Abs: 3.1 10*3/uL (ref 1.4–7.7)
Neutrophils Relative %: 41.4 % — ABNORMAL LOW (ref 43.0–77.0)
Platelets: 337 10*3/uL (ref 150.0–400.0)
RBC: 4.37 Mil/uL (ref 3.87–5.11)
RDW: 12.7 % (ref 11.5–15.5)
WBC: 7.4 10*3/uL (ref 4.0–10.5)

## 2017-08-30 LAB — BASIC METABOLIC PANEL
BUN: 24 mg/dL — ABNORMAL HIGH (ref 6–23)
CO2: 33 mEq/L — ABNORMAL HIGH (ref 19–32)
Calcium: 10.6 mg/dL — ABNORMAL HIGH (ref 8.4–10.5)
Chloride: 99 mEq/L (ref 96–112)
Creatinine, Ser: 0.95 mg/dL (ref 0.40–1.20)
GFR: 59.91 mL/min — ABNORMAL LOW (ref >60.00)
Glucose, Bld: 157 mg/dL — ABNORMAL HIGH (ref 70–99)
Potassium: 4.5 mEq/L (ref 3.5–5.1)
Sodium: 139 mEq/L (ref 135–145)

## 2017-08-30 LAB — HEPATIC FUNCTION PANEL
ALT: 19 U/L (ref 0–35)
AST: 24 U/L (ref 0–37)
Albumin: 4.5 g/dL (ref 3.5–5.2)
Alkaline Phosphatase: 56 U/L (ref 39–117)
Bilirubin, Direct: 0.1 mg/dL (ref 0.0–0.3)
Total Bilirubin: 0.5 mg/dL (ref 0.2–1.2)
Total Protein: 8.1 g/dL (ref 6.0–8.3)

## 2017-08-30 LAB — MICROALBUMIN / CREATININE URINE RATIO
Creatinine,U: 49.2 mg/dL
Microalb Creat Ratio: 1.4 mg/g (ref 0.0–30.0)
Microalb, Ur: <0.7 mg/dL (ref 0.0–1.9)

## 2017-08-31 LAB — PARATHYROID HORMONE, INTACT (NO CA): PTH: 26 pg/mL (ref 14–64)

## 2017-09-03 ENCOUNTER — Encounter

## 2017-09-03 ENCOUNTER — Ambulatory Visit: Payer: Medicare Other | Admitting: Neurology

## 2017-09-04 LAB — PROTEIN ELECTROPHORESIS, SERUM, WITH REFLEX
Albumin ELP: 4.6 g/dL (ref 3.8–4.8)
Alpha 1: 0.2 g/dL (ref 0.2–0.3)
Alpha 2: 0.8 g/dL (ref 0.5–0.9)
Beta 2: 0.5 g/dL (ref 0.2–0.5)
Beta Globulin: 0.6 g/dL (ref 0.4–0.6)
Gamma Globulin: 1.5 g/dL (ref 0.8–1.7)
Total Protein: 8.2 g/dL — ABNORMAL HIGH (ref 6.1–8.1)

## 2017-09-04 LAB — CALCIUM, IONIZED: Calcium, Ion: 5.77 mg/dL — ABNORMAL HIGH (ref 4.8–5.6)

## 2017-09-04 LAB — IFE INTERPRETATION

## 2017-09-06 ENCOUNTER — Telehealth: Payer: Self-pay | Admitting: Family

## 2017-09-06 DIAGNOSIS — L603 Nail dystrophy: Secondary | ICD-10-CM | POA: Diagnosis not present

## 2017-09-06 DIAGNOSIS — R778 Other specified abnormalities of plasma proteins: Secondary | ICD-10-CM

## 2017-09-06 DIAGNOSIS — L821 Other seborrheic keratosis: Secondary | ICD-10-CM | POA: Diagnosis not present

## 2017-09-06 DIAGNOSIS — Z85828 Personal history of other malignant neoplasm of skin: Secondary | ICD-10-CM | POA: Diagnosis not present

## 2017-09-06 DIAGNOSIS — D485 Neoplasm of uncertain behavior of skin: Secondary | ICD-10-CM | POA: Diagnosis not present

## 2017-09-06 DIAGNOSIS — D2271 Melanocytic nevi of right lower limb, including hip: Secondary | ICD-10-CM | POA: Diagnosis not present

## 2017-09-06 NOTE — Telephone Encounter (Signed)
Please contact pt and let her know that her lab work shows an abnormal protein in her blood. I would like for her to see hematology for further evaluation.

## 2017-09-07 NOTE — Telephone Encounter (Signed)
Advised patient of results and that a referral has been entered for hematology.

## 2017-09-11 ENCOUNTER — Other Ambulatory Visit: Payer: Self-pay

## 2017-09-11 ENCOUNTER — Ambulatory Visit (INDEPENDENT_AMBULATORY_CARE_PROVIDER_SITE_OTHER): Payer: Medicare Other | Admitting: Internal Medicine

## 2017-09-11 ENCOUNTER — Encounter: Payer: Self-pay | Admitting: Internal Medicine

## 2017-09-11 VITALS — BP 128/78 | HR 68 | Temp 98.1°F | Resp 14 | Ht 62.0 in | Wt 143.0 lb

## 2017-09-11 DIAGNOSIS — R778 Other specified abnormalities of plasma proteins: Secondary | ICD-10-CM | POA: Diagnosis not present

## 2017-09-11 DIAGNOSIS — I10 Essential (primary) hypertension: Secondary | ICD-10-CM | POA: Diagnosis not present

## 2017-09-11 DIAGNOSIS — R252 Cramp and spasm: Secondary | ICD-10-CM | POA: Diagnosis not present

## 2017-09-11 DIAGNOSIS — E11621 Type 2 diabetes mellitus with foot ulcer: Secondary | ICD-10-CM | POA: Diagnosis not present

## 2017-09-11 DIAGNOSIS — L97509 Non-pressure chronic ulcer of other part of unspecified foot with unspecified severity: Secondary | ICD-10-CM

## 2017-09-11 LAB — BASIC METABOLIC PANEL
BUN: 25 mg/dL — ABNORMAL HIGH (ref 6–23)
CO2: 29 mEq/L (ref 19–32)
Calcium: 10 mg/dL (ref 8.4–10.5)
Chloride: 101 mEq/L (ref 96–112)
Creatinine, Ser: 0.88 mg/dL (ref 0.40–1.20)
GFR: 65.44 mL/min (ref >60.00)
Glucose, Bld: 96 mg/dL (ref 70–99)
Potassium: 4.4 mEq/L (ref 3.5–5.1)
Sodium: 137 mEq/L (ref 135–145)

## 2017-09-11 LAB — HEMOGLOBIN A1C: Hgb A1c MFr Bld: 6.3 % (ref 4.6–6.5)

## 2017-09-11 LAB — CK: Total CK: 56 U/L (ref 7–177)

## 2017-09-11 LAB — TSH: TSH: 0.41 u[IU]/mL (ref 0.35–4.50)

## 2017-09-11 MED ORDER — BACLOFEN 10 MG PO TABS: 10.0000 mg | ORAL_TABLET | ORAL | 2 refills | Status: DC | PRN

## 2017-09-11 NOTE — Patient Instructions (Signed)
GO TO THE LAB : Get the blood work     GO TO THE FRONT DESK Schedule your next appointment for a  Check up in 3 months     Check the  blood pressure   daily Be sure your blood pressure is between 110/65 and  135/85. If it is consistently higher or lower, let me know  Diabetes: Check your blood sugar  once a day    Check your blood sugar  at different times of the day  GOALS: Fasting before a meal 70- 130 2 hours after a meal less than 180

## 2017-09-11 NOTE — Progress Notes (Signed)
Pre visit review using our clinic review tool, if applicable. No additional management support is needed unless otherwise documented below in the visit note. 

## 2017-09-11 NOTE — Progress Notes (Signed)
Subjective:    Patient ID: Patricia Moore, female    DOB: March 06, 1934, 84 y.o.   MRN: [id number]  DOS:  09/11/2017 Type of visit - description : Routine checkup Interval history: Was seen 08/27/2017 at this office by Melissa.  At the time she has severe muscle cramping, potassium was elevated at 5.7, creatinine elevated at 1.2, calcium 11.4, mg normal , total CK 327 which is higher than her usual.  Protein levels were elevated as well. Was recommended hydration, hold HCTZ and repeat labs. Repeat your labs show that the SPEP had a small M spike, PTH normal, potassium 4.5.   Was referred to hematology for abnormal spep  BP Readings from Last 3 Encounters:  09/11/17 128/78  08/27/17 (!) 157/58  07/30/17 136/78     Review of Systems Since the last office visit with Melissa she is doing well except last night when she had lower extremity cramps again, less intense than previous episodes. HTN: Ambulatory BPs occasionally elevated in the 150s DM: CBGs range from "low to high". Could not tell me how often they are low but a couple of days ago CBG was 54. Request a refill of baclofen Denies chest pain or difficulty breathing. Occasional lower extremity edema No myalgias per se except the cramps.   Past Medical History:  Diagnosis Date  . Allergy   . Anxiety 12/09/2013  . Arthritis   . Back pain   . Diabetes mellitus   . Diverticulitis    patient states has had three attacks   . Diverticulosis   . DJD (degenerative joint disease) 12/14/2014  . H. pylori infection 2001  . Heart murmur   . Hx of adenomatous colonic polyps   . Hyperlipidemia   . Hypertension   . Intraductal papilloma    bx nef x 2 in the 70s-90s  . Osteoporosis   . Saccular aneurysm   . SCC (squamous cell carcinoma) 03/2011   sees dermatology    Past Surgical History:  Procedure Laterality Date  . BREAST BIOPSY  '91, '95   INTRADUCTAL PAPILLOMA  . BREAST LUMPECTOMY    . CATARACT EXTRACTION Bilateral 09/2014      Social History   Socioeconomic History  . Marital status: Married    Spouse name: Not on file  . Number of children: 1  . Years of education: Not on file  . Highest education level: Not on file  Occupational History  . Occupation: retired     Associate Professor: [employer]  Social Needs  . Financial resource strain: Not on file  . Food insecurity:    Worry: Not on file    Inability: Not on file  . Transportation needs:    Medical: Not on file    Non-medical: Not on file  Tobacco Use  . Smoking status: Never Smoker  . Smokeless tobacco: Never Used  Substance and Sexual Activity  . Alcohol use: Yes    Alcohol/week: 0.6 oz    Types: 1 Glasses of wine per week    Comment: OCCASIONALLY  . Drug use: No  . Sexual activity: Never  Lifestyle  . Physical activity:    Days per week: Not on file    Minutes per session: Not on file  . Stress: Not on file  Relationships  . Social connections:    Talks on phone: Not on file    Gets together: Not on file    Attends religious service: Not on file    Active member of club  or organization: Not on file    Attends meetings of clubs or organizations: Not on file    Relationship status: Not on file  . Intimate partner violence:    Fear of current or ex partner: Not on file    Emotionally abused: Not on file    Physically abused: Not on file    Forced sexual activity: Not on file  Other Topics Concern  . Not on file  Social History Narrative   Born in [place], ,moved to the [place] in the 60s, live in [place] then in [place]   Lives w/ husband   H.S. Buyer, retail   Son lives in [place])       Allergies as of 09/11/2017   No Known Allergies     Medication List        Accurate as of 09/11/17 11:59 PM. Always use your most recent med list.          ALPRAZolam 0.25 MG tablet Commonly known as:  XANAX TAKE 1 TABLET BY MOUTH DAILY AS NEEDED   aspirin 81 MG tablet Take 81 mg by mouth daily.   baclofen 10 MG tablet Commonly known as:   LIORESAL Take 1 tablet (10 mg total) by mouth as needed for muscle spasms.   carvedilol 6.25 MG tablet Commonly known as:  COREG Take 1 tablet (6.25 mg total) by mouth 2 (two) times daily with a meal.   cholecalciferol 1000 units tablet Commonly known as:  VITAMIN D Take 1,000 Units by mouth daily.   co-enzyme Q-10 30 MG capsule Take 30 mg by mouth daily.   diazepam 5 MG tablet Commonly known as:  VALIUM Take 5 mg by mouth daily as needed (dizziness).   dorzolamide-timolol 22.3-6.8 MG/ML ophthalmic solution Commonly known as:  COSOPT Place 1 drop into both eyes 2 (two) times daily.   folic acid 1 MG tablet Commonly known as:  FOLVITE Take 1 tablet (1 mg total) by mouth daily.   glucose blood test strip Commonly known as:  ONE TOUCH ULTRA TEST CHECK BLOOD SUGAR NO MORE THAN 2 TIMES DAILY   glyBURIDE 2.5 MG tablet Commonly known as:  DIABETA Take 1 tablet (2.5 mg total) by mouth 2 (two) times daily with a meal.   meclizine 25 MG tablet Commonly known as:  ANTIVERT Take 1 tablet (25 mg total) by mouth 3 (three) times daily as needed for dizziness or nausea.   metFORMIN 1000 MG tablet Commonly known as:  GLUCOPHAGE TAKE 1 TABLET BY MOUTH WITH BREAKFAST, 1 TABLET WITH LUNCH AND 1/2 TABLET WITH DINNER DAILY   onetouch ultrasoft lancets CHECK BLOOD SUGAR NO MORE THAN TWICE DAILY          Objective:   Physical Exam BP 128/78 (BP Location: Left Arm, Patient Position: Sitting, Cuff Size: Small)   Pulse 68   Temp 98.1 F (36.7 C) (Oral)   Resp 14   Ht 5\' 2"  (1.575 m)   Wt 143 lb (64.9 kg)   LMP 04/17/1988   SpO2 96%   BMI 26.16 kg/m  General:   Well developed, well nourished . NAD.  HEENT:  Normocephalic . Face symmetric, atraumatic Lungs:  CTA B Normal respiratory effort, no intercostal retractions, no accessory muscle use. Heart: RRR,  no murmur.  No pretibial edema bilaterally  Skin: Not pale. Not jaundice Neurologic:  alert & oriented X3.  Speech  normal, gait appropriate for age and unassisted Psych--  Cognition and judgment appear intact.  Cooperative with  normal attention span and concentration.  Behavior appropriate. No anxious or depressed appearing.      Assessment & Plan:    Assessment  DM HTN -Losartan d/c d/t hyperkalemia -Amlodipine d/c d/t edema 07-2017 - HCTZ d/c d/t hyperkalemia 08/2017 Hyperlipidemia -- intolerant to lipitor and crestor (severe cramps) as off 01-2016  Anxiety- xanax rx by pcp Vertigo - valium prn GI: --Recurrent diverticulitis (one episode documented by a CT 11-2014) --colonoscopy 10-2013, Dr. Marina Goodell, + polyps, melanosis coli, tics --H. pylori 2001 DJD--back pain, knees, neck pain ( on baclofen for neck) Osteoporosis  SCC, skin cancer Elevated homocysteine Raynaud's phenomena  NEURO --Carotid US 10-2013: No significant plaque, although ICA's are serpentine, bilaterally. 40-59% bilateral ICA stenosis by velocity criteria likely due to tortuosity. Patent vertebral arteries with antegrade flow.Normal subclavian arteries, bilaterally. -- Transinet dizziness, diplopia sx late 2016 , saw neuro >>> Dx w/ TIA/Stroke on clinical grounds  W/u:  Brain  MRI 03-2015 >> 2 mm saccular aneurysm at the right posterior communicating artery origin, saw  Neurology, Rx to recheck MRI 1 year, ok continue ASA per neuro CTA head, neck 05-14-2015: aneurysm confirmed, otherwise (-) --brain aneurysm see above, saw neuro 2018, consider repeat imagine 2020 --Admitted 07-2015, dizziness, MRI negative, DX labyrinthitis    PLAN: DM: Currently on glyburide and metformin.  CBGs occasionally low, could not give me much details.  Rec to check ambulatory CBGs regularly and let me know if they are frequently low; of appropriate, will d/c glyburide.  Check a A1c and a TSH. HTN: Recently, potassium was elevated, HCTZ discontinued, BPs are occasionally elevated in the 150s, we talk about possibly adjust medication but she declined.  For  now continue checking ambulatory BPs and take carvedilol as prescribed. DJD: RF baclofen previously prescribed by neurology. Abnormal SPEP: As above, MM?,  Hematology referral pending.  Will recheck CK which was elevated and a potassium level. RTC 3 months

## 2017-09-11 NOTE — Patient Outreach (Signed)
[place] [facility]) Care Management  09/11/2017  SOUNDRA LAMPLEY 10/08/1933 [id number]   Medication Adherence call to Mrs. Emmersyn Kratzke spoke with patient she will pick up Metformin 1000 from [facility] today she had a doctor's appointment today and was waiting to see if doctor will change it to something else, patient  is waiting for the lab result. Mrs. Kukla is showing past due under [place].   [facility] Management Direct Dial [phone]  Fax [phone] [email or web address]

## 2017-09-12 NOTE — Assessment & Plan Note (Signed)
DM: Currently on glyburide and metformin.  CBGs occasionally low, could not give me much details.  Rec to check ambulatory CBGs regularly and let me know if they are frequently low; of appropriate, will d/c glyburide.  Check a A1c and a TSH. HTN: Recently, potassium was elevated, HCTZ discontinued, BPs are occasionally elevated in the 150s, we talk about possibly adjust medication but she declined.  For now continue checking ambulatory BPs and take carvedilol as prescribed. DJD: RF baclofen previously prescribed by neurology. Abnormal SPEP: As above, MM?,  Hematology referral pending.  Will recheck CK which was elevated and a potassium level. RTC 3 months

## 2017-09-18 ENCOUNTER — Other Ambulatory Visit: Payer: Self-pay

## 2017-09-18 NOTE — Patient Outreach (Signed)
[facility] [place]) Care Management  09/18/2017  AUDIANNA LANDGREN 1933-09-21 [id number]   Medication Adherence call to Mrs. Ladawna Walgren spoke with patient she said she pick up Metformin 1000 mg from [facility] on 09/12/17 a 30 days supply she is to see doctor at the end of July patient thinks doctor is going to change all her medications. Mrs. Jagodzinski is showing past due under [place] [employer]. Past due on Metformin 1000 mg.  [facility] Management Direct Dial [phone]  Fax [phone] [email or web address]

## 2017-09-19 ENCOUNTER — Encounter: Payer: Medicare Other | Admitting: Obstetrics & Gynecology

## 2017-10-06 ENCOUNTER — Observation Stay (HOSPITAL_COMMUNITY): Payer: Medicare Other

## 2017-10-06 ENCOUNTER — Emergency Department (HOSPITAL_COMMUNITY): Payer: Medicare Other

## 2017-10-06 ENCOUNTER — Encounter (HOSPITAL_COMMUNITY): Admission: EM | Disposition: A | Discharge status: Home Health | Payer: Self-pay | Source: Home / Self Care

## 2017-10-06 ENCOUNTER — Other Ambulatory Visit: Payer: Self-pay

## 2017-10-06 ENCOUNTER — Observation Stay (HOSPITAL_COMMUNITY): Payer: Medicare Other | Admitting: Certified Registered"

## 2017-10-06 ENCOUNTER — Inpatient Hospital Stay (HOSPITAL_COMMUNITY)
Admission: EM | Admit: 2017-10-06 | Discharge: 2017-10-13 | DRG: 563 | Disposition: A | Discharge status: Home Health | Payer: Medicare Other | Attending: General Surgery | Admitting: General Surgery

## 2017-10-06 ENCOUNTER — Encounter (HOSPITAL_COMMUNITY): Payer: Self-pay

## 2017-10-06 DIAGNOSIS — Z800 Family history of malignant neoplasm of digestive organs: Secondary | ICD-10-CM

## 2017-10-06 DIAGNOSIS — Z23 Encounter for immunization: Secondary | ICD-10-CM | POA: Diagnosis not present

## 2017-10-06 DIAGNOSIS — E559 Vitamin D deficiency, unspecified: Secondary | ICD-10-CM | POA: Diagnosis present

## 2017-10-06 DIAGNOSIS — E785 Hyperlipidemia, unspecified: Secondary | ICD-10-CM | POA: Diagnosis present

## 2017-10-06 DIAGNOSIS — Z808 Family history of malignant neoplasm of other organs or systems: Secondary | ICD-10-CM

## 2017-10-06 DIAGNOSIS — I671 Cerebral aneurysm, nonruptured: Secondary | ICD-10-CM | POA: Diagnosis not present

## 2017-10-06 DIAGNOSIS — Z8679 Personal history of other diseases of the circulatory system: Secondary | ICD-10-CM | POA: Diagnosis not present

## 2017-10-06 DIAGNOSIS — Z86010 Personal history of colonic polyps: Secondary | ICD-10-CM

## 2017-10-06 DIAGNOSIS — Z961 Presence of intraocular lens: Secondary | ICD-10-CM | POA: Diagnosis not present

## 2017-10-06 DIAGNOSIS — E1165 Type 2 diabetes mellitus with hyperglycemia: Secondary | ICD-10-CM | POA: Diagnosis not present

## 2017-10-06 DIAGNOSIS — S4991XA Unspecified injury of right shoulder and upper arm, initial encounter: Secondary | ICD-10-CM | POA: Diagnosis not present

## 2017-10-06 DIAGNOSIS — Y92481 Parking lot as the place of occurrence of the external cause: Secondary | ICD-10-CM

## 2017-10-06 DIAGNOSIS — M810 Age-related osteoporosis without current pathological fracture: Secondary | ICD-10-CM | POA: Diagnosis present

## 2017-10-06 DIAGNOSIS — S0231XA Fracture of orbital floor, right side, initial encounter for closed fracture: Secondary | ICD-10-CM | POA: Diagnosis present

## 2017-10-06 DIAGNOSIS — S199XXA Unspecified injury of neck, initial encounter: Secondary | ICD-10-CM | POA: Diagnosis not present

## 2017-10-06 DIAGNOSIS — S43004A Unspecified dislocation of right shoulder joint, initial encounter: Secondary | ICD-10-CM | POA: Diagnosis not present

## 2017-10-06 DIAGNOSIS — R52 Pain, unspecified: Secondary | ICD-10-CM | POA: Diagnosis not present

## 2017-10-06 DIAGNOSIS — S0285XA Fracture of orbit, unspecified, initial encounter for closed fracture: Secondary | ICD-10-CM

## 2017-10-06 DIAGNOSIS — R0902 Hypoxemia: Secondary | ICD-10-CM | POA: Diagnosis not present

## 2017-10-06 DIAGNOSIS — S3993XA Unspecified injury of pelvis, initial encounter: Secondary | ICD-10-CM | POA: Diagnosis not present

## 2017-10-06 DIAGNOSIS — Z9842 Cataract extraction status, left eye: Secondary | ICD-10-CM | POA: Diagnosis not present

## 2017-10-06 DIAGNOSIS — Z85828 Personal history of other malignant neoplasm of skin: Secondary | ICD-10-CM

## 2017-10-06 DIAGNOSIS — H11421 Conjunctival edema, right eye: Secondary | ICD-10-CM | POA: Diagnosis not present

## 2017-10-06 DIAGNOSIS — S0993XA Unspecified injury of face, initial encounter: Secondary | ICD-10-CM | POA: Diagnosis not present

## 2017-10-06 DIAGNOSIS — M79603 Pain in arm, unspecified: Secondary | ICD-10-CM | POA: Diagnosis not present

## 2017-10-06 DIAGNOSIS — Z823 Family history of stroke: Secondary | ICD-10-CM

## 2017-10-06 DIAGNOSIS — S99921A Unspecified injury of right foot, initial encounter: Secondary | ICD-10-CM | POA: Diagnosis not present

## 2017-10-06 DIAGNOSIS — S0990XA Unspecified injury of head, initial encounter: Secondary | ICD-10-CM | POA: Diagnosis not present

## 2017-10-06 DIAGNOSIS — S4990XA Unspecified injury of shoulder and upper arm, unspecified arm, initial encounter: Secondary | ICD-10-CM | POA: Diagnosis not present

## 2017-10-06 DIAGNOSIS — Z79899 Other long term (current) drug therapy: Secondary | ICD-10-CM

## 2017-10-06 DIAGNOSIS — S022XXA Fracture of nasal bones, initial encounter for closed fracture: Secondary | ICD-10-CM | POA: Diagnosis present

## 2017-10-06 DIAGNOSIS — S99912A Unspecified injury of left ankle, initial encounter: Secondary | ICD-10-CM | POA: Diagnosis not present

## 2017-10-06 DIAGNOSIS — F419 Anxiety disorder, unspecified: Secondary | ICD-10-CM | POA: Diagnosis present

## 2017-10-06 DIAGNOSIS — I10 Essential (primary) hypertension: Secondary | ICD-10-CM | POA: Diagnosis not present

## 2017-10-06 DIAGNOSIS — Z7982 Long term (current) use of aspirin: Secondary | ICD-10-CM

## 2017-10-06 DIAGNOSIS — S299XXA Unspecified injury of thorax, initial encounter: Secondary | ICD-10-CM | POA: Diagnosis not present

## 2017-10-06 DIAGNOSIS — M25572 Pain in left ankle and joints of left foot: Secondary | ICD-10-CM | POA: Diagnosis present

## 2017-10-06 DIAGNOSIS — S3991XA Unspecified injury of abdomen, initial encounter: Secondary | ICD-10-CM | POA: Diagnosis not present

## 2017-10-06 DIAGNOSIS — S0240CA Maxillary fracture, right side, initial encounter for closed fracture: Secondary | ICD-10-CM | POA: Diagnosis not present

## 2017-10-06 DIAGNOSIS — Z8249 Family history of ischemic heart disease and other diseases of the circulatory system: Secondary | ICD-10-CM | POA: Diagnosis not present

## 2017-10-06 DIAGNOSIS — S0101XA Laceration without foreign body of scalp, initial encounter: Secondary | ICD-10-CM | POA: Diagnosis not present

## 2017-10-06 DIAGNOSIS — S0281XA Fracture of other specified skull and facial bones, right side, initial encounter for closed fracture: Secondary | ICD-10-CM | POA: Diagnosis not present

## 2017-10-06 DIAGNOSIS — S43014A Anterior dislocation of right humerus, initial encounter: Secondary | ICD-10-CM | POA: Diagnosis not present

## 2017-10-06 DIAGNOSIS — M79671 Pain in right foot: Secondary | ICD-10-CM | POA: Diagnosis present

## 2017-10-06 DIAGNOSIS — H1131 Conjunctival hemorrhage, right eye: Secondary | ICD-10-CM | POA: Diagnosis not present

## 2017-10-06 DIAGNOSIS — S0280XA Fracture of other specified skull and facial bones, unspecified side, initial encounter for closed fracture: Secondary | ICD-10-CM

## 2017-10-06 DIAGNOSIS — Z9841 Cataract extraction status, right eye: Secondary | ICD-10-CM

## 2017-10-06 DIAGNOSIS — Y9301 Activity, walking, marching and hiking: Secondary | ICD-10-CM | POA: Diagnosis present

## 2017-10-06 DIAGNOSIS — S01111A Laceration without foreign body of right eyelid and periocular area, initial encounter: Secondary | ICD-10-CM | POA: Diagnosis not present

## 2017-10-06 DIAGNOSIS — M7989 Other specified soft tissue disorders: Secondary | ICD-10-CM | POA: Diagnosis not present

## 2017-10-06 DIAGNOSIS — Z7984 Long term (current) use of oral hypoglycemic drugs: Secondary | ICD-10-CM

## 2017-10-06 DIAGNOSIS — E119 Type 2 diabetes mellitus without complications: Secondary | ICD-10-CM | POA: Diagnosis not present

## 2017-10-06 DIAGNOSIS — Z803 Family history of malignant neoplasm of breast: Secondary | ICD-10-CM

## 2017-10-06 HISTORY — PX: CLOSED REDUCTION ULNAR SHAFT: SHX5775

## 2017-10-06 LAB — COMPREHENSIVE METABOLIC PANEL
ALT: 21 U/L (ref 14–54)
AST: 29 U/L (ref 15–41)
Albumin: 4.3 g/dL (ref 3.5–5.0)
Alkaline Phosphatase: 51 U/L (ref 38–126)
Anion gap: 10 (ref 5–15)
BUN: 31 mg/dL — ABNORMAL HIGH (ref 6–20)
CO2: 27 mmol/L (ref 22–32)
Calcium: 10.1 mg/dL (ref 8.9–10.3)
Chloride: 100 mmol/L — ABNORMAL LOW (ref 101–111)
Creatinine, Ser: 0.93 mg/dL (ref 0.44–1.00)
GFR calc Af Amer: >60 mL/min (ref >60)
GFR calc non Af Amer: 56 mL/min — ABNORMAL LOW (ref >60)
Glucose, Bld: 112 mg/dL — ABNORMAL HIGH (ref 65–99)
Potassium: 4 mmol/L (ref 3.5–5.1)
Sodium: 137 mmol/L (ref 135–145)
Total Bilirubin: 0.7 mg/dL (ref 0.3–1.2)
Total Protein: 7.5 g/dL (ref 6.5–8.1)

## 2017-10-06 LAB — URINALYSIS, ROUTINE W REFLEX MICROSCOPIC
Bilirubin Urine: NEGATIVE
Glucose, UA: NEGATIVE mg/dL
Hgb urine dipstick: NEGATIVE
Ketones, ur: 5 mg/dL — AB
Leukocytes, UA: NEGATIVE
Nitrite: NEGATIVE
Protein, ur: NEGATIVE mg/dL
Specific Gravity, Urine: 1.034 — ABNORMAL HIGH (ref 1.005–1.030)
pH: 7 (ref 5.0–8.0)

## 2017-10-06 LAB — ETHANOL: Alcohol, Ethyl (B): <10 mg/dL (ref <10)

## 2017-10-06 LAB — I-STAT CG4 LACTIC ACID, ED: Lactic Acid, Venous: 1.39 mmol/L (ref 0.5–1.9)

## 2017-10-06 LAB — I-STAT CHEM 8, ED
BUN: 33 mg/dL — ABNORMAL HIGH (ref 6–20)
Calcium, Ion: 1.21 mmol/L (ref 1.15–1.40)
Chloride: 101 mmol/L (ref 101–111)
Creatinine, Ser: 0.9 mg/dL (ref 0.44–1.00)
Glucose, Bld: 107 mg/dL — ABNORMAL HIGH (ref 65–99)
HCT: 37 % (ref 36.0–46.0)
Hemoglobin: 12.6 g/dL (ref 12.0–15.0)
Potassium: 3.8 mmol/L (ref 3.5–5.1)
Sodium: 138 mmol/L (ref 135–145)
TCO2: 25 mmol/L (ref 22–32)

## 2017-10-06 LAB — SAMPLE TO BLOOD BANK

## 2017-10-06 LAB — CBC
HCT: 39 % (ref 36.0–46.0)
Hemoglobin: 12.8 g/dL (ref 12.0–15.0)
MCH: 30.8 pg (ref 26.0–34.0)
MCHC: 32.8 g/dL (ref 30.0–36.0)
MCV: 94 fL (ref 78.0–100.0)
Platelets: 319 10*3/uL (ref 150–400)
RBC: 4.15 MIL/uL (ref 3.87–5.11)
RDW: 11.9 % (ref 11.5–15.5)
WBC: 19.6 10*3/uL — ABNORMAL HIGH (ref 4.0–10.5)

## 2017-10-06 LAB — PROTIME-INR
INR: 1.07
Prothrombin Time: 13.8 seconds (ref 11.4–15.2)

## 2017-10-06 LAB — GLUCOSE, CAPILLARY: Glucose-Capillary: 207 mg/dL — ABNORMAL HIGH (ref 65–99)

## 2017-10-06 SURGERY — CLOSED REDUCTION, FRACTURE, ULNA, SHAFT
Anesthesia: General | Site: Shoulder | Laterality: Right

## 2017-10-06 MED ORDER — ONDANSETRON HCL 4 MG/2ML IJ SOLN: 4.0000 mg | Freq: Four times a day (QID) | INTRAMUSCULAR | Status: DC | PRN

## 2017-10-06 MED ORDER — INSULIN ASPART 100 UNIT/ML ~~LOC~~ SOLN
0.0000 [IU] | Freq: Every day | SUBCUTANEOUS | Status: DC
  Administered 2017-10-08: 2 [IU] via SUBCUTANEOUS
  Administered 2017-10-10: 1 [IU] via SUBCUTANEOUS

## 2017-10-06 MED ORDER — FENTANYL CITRATE (PF) 100 MCG/2ML IJ SOLN
50.0000 ug | Freq: Once | INTRAMUSCULAR | Status: AC
  Administered 2017-10-06: 50 ug via INTRAVENOUS

## 2017-10-06 MED ORDER — DEXTROSE-NACL 5-0.9 % IV SOLN
INTRAVENOUS | Status: DC
  Administered 2017-10-07 – 2017-10-08 (×4): via INTRAVENOUS

## 2017-10-06 MED ORDER — ONDANSETRON HCL 4 MG/2ML IJ SOLN
4.0000 mg | Freq: Once | INTRAMUSCULAR | Status: AC
  Administered 2017-10-06: 4 mg via INTRAVENOUS

## 2017-10-06 MED ORDER — MEPERIDINE HCL 50 MG/ML IJ SOLN: 6.2500 mg | INTRAMUSCULAR | Status: DC | PRN

## 2017-10-06 MED ORDER — ONDANSETRON HCL 4 MG/2ML IJ SOLN: 4.0000 mg | Freq: Once | INTRAMUSCULAR | Status: DC | PRN

## 2017-10-06 MED ORDER — LIDOCAINE HCL (PF) 1 % IJ SOLN
10.0000 mL | Freq: Once | INTRAMUSCULAR | Status: AC
  Administered 2017-10-06: 10 mL
  Filled 2017-10-06: qty 10

## 2017-10-06 MED ORDER — FENTANYL CITRATE (PF) 250 MCG/5ML IJ SOLN
INTRAMUSCULAR | Status: DC | PRN
  Administered 2017-10-06: 100 ug via INTRAVENOUS

## 2017-10-06 MED ORDER — HYDROMORPHONE HCL 1 MG/ML IJ SOLN: 1.0000 mg | INTRAMUSCULAR | Status: DC | PRN

## 2017-10-06 MED ORDER — FENTANYL CITRATE (PF) 100 MCG/2ML IJ SOLN
50.0000 ug | Freq: Once | INTRAMUSCULAR | Status: AC
  Administered 2017-10-06: 50 ug via INTRAVENOUS
  Filled 2017-10-06: qty 2

## 2017-10-06 MED ORDER — PROPOFOL 10 MG/ML IV BOLUS
INTRAVENOUS | Status: AC
  Filled 2017-10-06: qty 20

## 2017-10-06 MED ORDER — LIDOCAINE HCL (CARDIAC) PF 100 MG/5ML IV SOSY
PREFILLED_SYRINGE | INTRAVENOUS | Status: DC | PRN
  Administered 2017-10-06: 100 mg via INTRATRACHEAL

## 2017-10-06 MED ORDER — MECLIZINE HCL 25 MG PO TABS
25.0000 mg | ORAL_TABLET | Freq: Three times a day (TID) | ORAL | Status: DC | PRN
  Filled 2017-10-06: qty 1

## 2017-10-06 MED ORDER — PROPOFOL 10 MG/ML IV BOLUS
INTRAVENOUS | Status: DC | PRN
  Administered 2017-10-06: 80 mg via INTRAVENOUS

## 2017-10-06 MED ORDER — FENTANYL CITRATE (PF) 250 MCG/5ML IJ SOLN
INTRAMUSCULAR | Status: AC
  Filled 2017-10-06: qty 5

## 2017-10-06 MED ORDER — SODIUM CHLORIDE 0.9 % IV BOLUS
1000.0000 mL | Freq: Once | INTRAVENOUS | Status: AC
  Administered 2017-10-06: 1000 mL via INTRAVENOUS

## 2017-10-06 MED ORDER — METOCLOPRAMIDE HCL 5 MG/ML IJ SOLN
10.0000 mg | Freq: Once | INTRAMUSCULAR | Status: AC
  Administered 2017-10-06: 10 mg via INTRAVENOUS

## 2017-10-06 MED ORDER — SUCCINYLCHOLINE CHLORIDE 20 MG/ML IJ SOLN
INTRAMUSCULAR | Status: DC | PRN
  Administered 2017-10-06: 120 mg via INTRAVENOUS

## 2017-10-06 MED ORDER — ONDANSETRON HCL 4 MG/2ML IJ SOLN
INTRAMUSCULAR | Status: DC | PRN
  Administered 2017-10-06: 4 mg via INTRAVENOUS

## 2017-10-06 MED ORDER — ONDANSETRON HCL 4 MG/2ML IJ SOLN
INTRAMUSCULAR | Status: AC
  Filled 2017-10-06: qty 2

## 2017-10-06 MED ORDER — DIAZEPAM 5 MG PO TABS
5.0000 mg | ORAL_TABLET | Freq: Every day | ORAL | Status: DC | PRN
  Administered 2017-10-07 – 2017-10-12 (×6): 5 mg via ORAL
  Filled 2017-10-06 (×6): qty 1

## 2017-10-06 MED ORDER — CEFAZOLIN SODIUM-DEXTROSE 1-4 GM/50ML-% IV SOLN
1.0000 g | Freq: Once | INTRAVENOUS | Status: AC
  Administered 2017-10-06: 1 g via INTRAVENOUS
  Filled 2017-10-06: qty 50

## 2017-10-06 MED ORDER — ENOXAPARIN SODIUM 40 MG/0.4ML ~~LOC~~ SOLN
40.0000 mg | SUBCUTANEOUS | Status: DC
  Administered 2017-10-08 – 2017-10-12 (×5): 40 mg via SUBCUTANEOUS
  Filled 2017-10-06 (×5): qty 0.4

## 2017-10-06 MED ORDER — TETANUS-DIPHTH-ACELL PERTUSSIS 5-2.5-18.5 LF-MCG/0.5 IM SUSP
0.5000 mL | Freq: Once | INTRAMUSCULAR | Status: AC
  Administered 2017-10-06: 0.5 mL via INTRAMUSCULAR
  Filled 2017-10-06: qty 0.5

## 2017-10-06 MED ORDER — IOHEXOL 300 MG/ML  SOLN
100.0000 mL | Freq: Once | INTRAMUSCULAR | Status: AC | PRN
  Administered 2017-10-06: 100 mL via INTRAVENOUS

## 2017-10-06 MED ORDER — CARVEDILOL 12.5 MG PO TABS
6.2500 mg | ORAL_TABLET | Freq: Two times a day (BID) | ORAL | Status: DC
  Administered 2017-10-07 – 2017-10-13 (×13): 6.25 mg via ORAL
  Filled 2017-10-06 (×13): qty 1

## 2017-10-06 MED ORDER — INSULIN ASPART 100 UNIT/ML ~~LOC~~ SOLN
0.0000 [IU] | Freq: Three times a day (TID) | SUBCUTANEOUS | Status: DC
  Administered 2017-10-07: 5 [IU] via SUBCUTANEOUS
  Administered 2017-10-07 – 2017-10-08 (×5): 3 [IU] via SUBCUTANEOUS
  Administered 2017-10-09 – 2017-10-11 (×4): 1 [IU] via SUBCUTANEOUS
  Administered 2017-10-11: 2 [IU] via SUBCUTANEOUS
  Administered 2017-10-11: 3 [IU] via SUBCUTANEOUS
  Administered 2017-10-12 – 2017-10-13 (×4): 2 [IU] via SUBCUTANEOUS

## 2017-10-06 MED ORDER — FENTANYL CITRATE (PF) 100 MCG/2ML IJ SOLN
INTRAMUSCULAR | Status: AC
  Filled 2017-10-06: qty 2

## 2017-10-06 MED ORDER — DEXAMETHASONE SODIUM PHOSPHATE 10 MG/ML IJ SOLN
INTRAMUSCULAR | Status: DC | PRN
  Administered 2017-10-06: 10 mg via INTRAVENOUS

## 2017-10-06 MED ORDER — SODIUM CHLORIDE 0.9 % IV SOLN
INTRAVENOUS | Status: DC | PRN
  Administered 2017-10-06: 23:00:00 via INTRAVENOUS

## 2017-10-06 MED ORDER — ONDANSETRON 4 MG PO TBDP: 4.0000 mg | ORAL_TABLET | Freq: Four times a day (QID) | ORAL | Status: DC | PRN

## 2017-10-06 MED ORDER — HYDROMORPHONE HCL 1 MG/ML IJ SOLN: 0.2500 mg | INTRAMUSCULAR | Status: DC | PRN

## 2017-10-06 SURGICAL SUPPLY — 1 items: SLING ARM FOAM STRAP MED (SOFTGOODS) ×1 IMPLANT

## 2017-10-06 NOTE — ED Notes (Signed)
Pt feels better after reglan, now attempting to finish CT scan

## 2017-10-06 NOTE — Anesthesia Preprocedure Evaluation (Addendum)
Anesthesia Evaluation  Patient identified by MRN, date of birth, ID band Patient awake    Reviewed: Allergy & Precautions, NPO status , Patient's Chart, lab work & pertinent test results  Airway Mallampati: I  TM Distance: >3 FB Neck ROM: Full    Dental   Pulmonary    Pulmonary exam normal        Cardiovascular hypertension, Pt. on medications Normal cardiovascular exam     Neuro/Psych Anxiety    GI/Hepatic   Endo/Other  diabetes  Renal/GU      Musculoskeletal   Abdominal   Peds  Hematology   Anesthesia Other Findings   Reproductive/Obstetrics                             Anesthesia Physical Anesthesia Plan  ASA: II  Anesthesia Plan: General   Post-op Pain Management:    Induction: Intravenous  PONV Risk Score and Plan: 3 and Treatment may vary due to age or medical condition  Airway Management Planned: Oral ETT  Additional Equipment:   Intra-op Plan:   Post-operative Plan: Extubation in OR  Informed Consent: I have reviewed the patients History and Physical, chart, labs and discussed the procedure including the risks, benefits and alternatives for the proposed anesthesia with the patient or authorized representative who has indicated his/her understanding and acceptance.     Plan Discussed with: CRNA and Surgeon  Anesthesia Plan Comments:        Anesthesia Quick Evaluation

## 2017-10-06 NOTE — ED Notes (Signed)
Pt in CT scan and began vomiting after CT of head and c-spine, Pt's mouth suctioned, airway intact, Dr Dayna Barker in [place] and gave order for 10mg  reglan.

## 2017-10-06 NOTE — Anesthesia Procedure Notes (Signed)
Procedure Name: Intubation Date/Time: 10/06/2017 11:17 PM Performed by: Claris Che, CRNA Pre-anesthesia Checklist: Patient identified, Emergency Drugs available, Suction available, Patient being monitored and Timeout performed Patient Re-evaluated:Patient Re-evaluated prior to induction Oxygen Delivery Method: Circle system utilized Preoxygenation: Pre-oxygenation with 100% oxygen Induction Type: IV induction, Rapid sequence and Cricoid Pressure applied Laryngoscope Size: Mac and 3 Grade View: Grade I Tube type: Oral Tube size: 7.5 mm Number of attempts: 1 Airway Equipment and Method: Stylet Placement Confirmation: ETT inserted through vocal cords under direct vision,  positive ETCO2 and breath sounds checked- equal and bilateral Secured at: 23 cm Tube secured with: Tape Dental Injury: Teeth and Oropharynx as per pre-operative assessment
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
## 2017-10-06 NOTE — Brief Op Note (Signed)
10/06/2017  11:29 PM  PATIENT:  Patricia Moore  84 y.o. female  PRE-OPERATIVE DIAGNOSIS:  RIGHT SHOULDER DISLOCATION  POST-OPERATIVE DIAGNOSIS:  RIGHT SHOULDER DISLOCATION  PROCEDURE:  Procedure(s): CLOSED REDUCTION DISLOCATED RIGHT SHOULDER (Right)  SURGEON:  Surgeon(s) and Role:    * Mcarthur Rossetti, MD - Primary  ANESTHESIA:   general  DICTATION: .Other Dictation: Dictation Number [id number]  PLAN OF CARE: Admit to inpatient   PATIENT DISPOSITION:  PACU - hemodynamically stable.   Delay start of Pharmacological VTE agent (>24hrs) due to surgical blood loss or risk of bleeding: no
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
## 2017-10-06 NOTE — Consult Note (Signed)
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 Dr. Norm Salt, who verbally acknowledged these results. Electronically Signed   By: Vinnie Langton M.D.   On: 10/06/2017 18:28   I have independently reviewed x-rays of her right shoulder that does show an anterior shoulder dislocation without an obvious fracture.   ROS Blood pressure (!) 167/60, pulse 68, temperature 97.7 F (36.5 C), temperature source Oral, resp. rate 18, height 5' 3"  (1.6 m), weight 170 lb (77.1 kg), last menstrual period 04/17/1988, SpO2 100 %. Physical Exam  Constitutional: She appears well-developed and well-nourished.  Musculoskeletal:       Right shoulder: She exhibits decreased range of motion, tenderness, bony tenderness, deformity and decreased strength.  Neurological: She is alert.   Her right wrist and hand have palpable pulses and is well-perfused.  She is able to flex her right wrist and extend her fingers.  Gross examination with palpation of the long bones of the upper and lower extremities show no obvious deformities. Compression of the pelvis and rotation of the hips causes no pain.  Assessment/Plan: Right anterior shoulder traumatic  dislocation  This certainly needs to be addressed in the operating room.  I talked to her and her family at the bedside about performing a closed reduction under general anesthesia of the right shoulder.  I have discussed the risk of fracture that could lead to her needing a shoulder replacement.  I talked about the failure of a close reduction that would warrant an open procedure at a different time.  Hopefully with the right amount of anesthesia we will be able to manipulate the shoulder under close methods and reduce the right shoulder.  Informed consent is obtained from her family member.  I have communicated with the trauma surgeon on call about proceeding urgently to the operating room this evening since the shoulder has been out for 7 hours.  She will still need a good secondary and tertiary orthopedic survey to make sure there is no other injuries from her distracting trauma.  Mcarthur Rossetti 10/06/2017, 10:18 PM

## 2017-10-06 NOTE — Transfer of Care (Signed)
Immediate Anesthesia Transfer of Care Note  Patient: Patricia Moore  Procedure(s) Performed: CLOSED REDUCTION DISLOCATED RIGHT SHOULDER (Right Shoulder)  Patient Location: PACU  Anesthesia Type:General  Level of Consciousness: awake, alert , oriented and patient cooperative  Airway & Oxygen Therapy: Patient Spontanous Breathing and Patient connected to nasal cannula oxygen  Post-op Assessment: Report given to RN, Post -op Vital signs reviewed and stable and Patient moving all extremities X 4  Post vital signs: Reviewed and stable  Last Vitals:  Vitals Value Taken Time  BP 125/53 10/06/2017 11:40 PM  Temp    Pulse 87 10/06/2017 11:41 PM  Resp 16 10/06/2017 11:41 PM  SpO2 96 % 10/06/2017 11:41 PM  Vitals shown include unvalidated device data.  Last Pain:  Vitals:   10/06/17 2046  TempSrc:   PainSc: 0-No pain         Complications: No apparent anesthesia complications
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
## 2017-10-06 NOTE — Progress Notes (Signed)
Orthopedic Tech Progress Note Patient Details:  Patricia Moore 01-Jul-1933 [id number] Level 2 trauma ortho visit. Patient ID: Patricia Moore, female   DOB: [birth year], 84 y.o.   MRN: [id number]   Braulio Bosch 10/06/2017, 3:48 PM

## 2017-10-06 NOTE — Consult Note (Signed)
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 the paranasal sinuses, most evident in the sphenoid sinuses where there is partial opacification of the left sphenoid sinus. Soft tissues: Large high attenuation fluid collection in the right supraorbital scalp (axial image 12 of series 4 and sagittal image 12 of series 7) measuring 2.1 x 3.8 x 4.9 cm, with internal gas, most compatible with a scalp laceration/hematoma. Additional smaller high attenuation fluid collection anterior to the right maxilla, compatible with an additional hematoma. Soft tissue swelling overlying the right mandible. CT CERVICAL SPINE FINDINGS Alignment: Normal. Skull base and vertebrae: No acute fracture. No primary bone lesion or focal pathologic process. Soft tissues and spinal canal: No prevertebral fluid or swelling. No visible canal hematoma. Disc levels: Mild multilevel degenerative disc disease, most severe at C4-C5 and C5-C6. Mild multilevel facet arthropathy. Upper chest: Unremarkable. Other: None. IMPRESSION: 1. Extensive right orbital trauma including blowout fracture of the orbital floor and  medial wall of the orbit, with some herniation of extraconal fat, but no entrapment of extraocular muscles at this time. 2. Nondisplaced fractures of the right nasal bone and lateral wall of the right maxillary sinus.  3. Large right supraorbital frontal scalp laceration/hematoma. Smaller hematoma anterior to the right maxilla. 4. No acute intracranial abnormality. 5. No signs of significant acute traumatic injury to the cervical spine. These results were called by telephone at the time of interpretation on 10/06/2017 at 6:27 pm to Dr. Norm Salt, who verbally acknowledged these results. Electronically Signed   By: Vinnie Langton M.D.   On: 10/06/2017 18:28   Review of Systems  Constitutional: Negative for chills and fever.  HENT: Positive for facial swelling and nosebleeds. Negative for ear pain and sore throat.   Eyes: Positive for pain and visual disturbance.  Respiratory: Negative for cough and shortness of breath.   Cardiovascular: Negative for chest pain and palpitations.  Gastrointestinal: Positive for abdominal pain. Negative for vomiting.  Genitourinary: Negative for flank pain.  Musculoskeletal: Positive for arthralgias and myalgias. Negative for back pain.  Skin: Negative for color change and rash.  Neurological: Positive for loss of consciousness, syncope and headaches.  Psychiatric/Behavioral: Positive for confusion.   Blood pressure (!) 160/64, pulse 66, temperature 97.7 F (36.5 C), temperature source Oral, resp. rate 20, height 5' 3"  (1.6 m), weight 170 lb (77.1 kg), last menstrual period 04/17/1988, SpO2 100 %. Physical Exam  Constitutional: She appears well-developed and well-nourished. No distress.  Head: Scalp and facial hematoma. Multiple facial lacerations repaired by the ER MD. Eyes: Severe right periorbital ecchymosis and edema. Unable to fully evaluate the right pupil and EOM. The left eye is normal. Ears: Normal auricles and EACs.  Mouth/Throat: Oropharynx is clear  and moist.  Neck: Neck supple. No laceration, mass, or lesion. Cardiovascular: Normal rate, regular rhythm, normal heart sounds and intact distal pulses.  Pulmonary/Chest: Effort normal and breath sounds normal. No respiratory distress. She exhibits no tenderness.  Neurological: She is alert and oriented. Skin: Skin is warm and dry.  Nursing note and vitals reviewed.  Assessment/Plan: Facial trauma with right orbital blowout fractures, right maxillary fractures, and nondisplaced nasal fractures. Will need to evaluate extraocular motion once the edema subsides. Non-operative at this time. Will follow.  Lynett Brasil W Renatta Shrieves 10/06/2017, 11:09 PM
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
## 2017-10-06 NOTE — ED Notes (Signed)
Pt had large bowel movement in bed, pt cleaned and bed changed by this RN, 2 emt's and Programme researcher, broadcasting/film/video. Pt tolerated well. After cleaning pt complaining of nausea and had episode of dry heaves. Dr Berline Lopes notified and to order zofran.

## 2017-10-07 ENCOUNTER — Encounter (HOSPITAL_COMMUNITY): Payer: Self-pay | Admitting: Orthopaedic Surgery

## 2017-10-07 LAB — BASIC METABOLIC PANEL
Anion gap: 9 (ref 5–15)
BUN: 22 mg/dL — ABNORMAL HIGH (ref 6–20)
CO2: 24 mmol/L (ref 22–32)
Calcium: 9.1 mg/dL (ref 8.9–10.3)
Chloride: 103 mmol/L (ref 101–111)
Creatinine, Ser: 1.16 mg/dL — ABNORMAL HIGH (ref 0.44–1.00)
GFR calc Af Amer: 50 mL/min — ABNORMAL LOW (ref >60)
GFR calc non Af Amer: 43 mL/min — ABNORMAL LOW (ref >60)
Glucose, Bld: 276 mg/dL — ABNORMAL HIGH (ref 65–99)
Potassium: 3.9 mmol/L (ref 3.5–5.1)
Sodium: 136 mmol/L (ref 135–145)

## 2017-10-07 LAB — GLUCOSE, CAPILLARY
Glucose-Capillary: 296 mg/dL — ABNORMAL HIGH (ref 65–99)
Glucose-Capillary: 249 mg/dL — ABNORMAL HIGH (ref 65–99)
Glucose-Capillary: 240 mg/dL — ABNORMAL HIGH (ref 65–99)
Glucose-Capillary: 197 mg/dL — ABNORMAL HIGH (ref 65–99)

## 2017-10-07 LAB — CBC
HCT: 32 % — ABNORMAL LOW (ref 36.0–46.0)
Hemoglobin: 10.1 g/dL — ABNORMAL LOW (ref 12.0–15.0)
MCH: 29.7 pg (ref 26.0–34.0)
MCHC: 31.6 g/dL (ref 30.0–36.0)
MCV: 94.1 fL (ref 78.0–100.0)
Platelets: 261 10*3/uL (ref 150–400)
RBC: 3.4 MIL/uL — ABNORMAL LOW (ref 3.87–5.11)
RDW: 12.1 % (ref 11.5–15.5)
WBC: 10.7 10*3/uL — ABNORMAL HIGH (ref 4.0–10.5)

## 2017-10-07 MED ORDER — ACETAMINOPHEN 500 MG PO TABS
1000.0000 mg | ORAL_TABLET | Freq: Four times a day (QID) | ORAL | Status: DC
  Administered 2017-10-07 – 2017-10-13 (×17): 1000 mg via ORAL
  Filled 2017-10-07 (×17): qty 2

## 2017-10-07 MED ORDER — HYDROMORPHONE HCL 2 MG/ML IJ SOLN
1.0000 mg | INTRAMUSCULAR | Status: DC | PRN
  Administered 2017-10-07 – 2017-10-09 (×6): 1 mg via INTRAVENOUS
  Filled 2017-10-07 (×6): qty 1

## 2017-10-07 MED ORDER — TRAMADOL HCL 50 MG PO TABS
50.0000 mg | ORAL_TABLET | Freq: Four times a day (QID) | ORAL | Status: DC | PRN
  Administered 2017-10-08: 50 mg via ORAL
  Filled 2017-10-07: qty 1

## 2017-10-07 NOTE — Progress Notes (Signed)
Patient ID: Patricia Moore, female   DOB: 04-20-1933, 83 y.o.   MRN: [id number] Awake and alert this am with family at the bedside.  Right shoulder clinically located.  Right hand with intact motor/sensaory/perfusion.  Does have shoulder pain to be expected.  She and her family understand that she should wear her sling for the next two weeks.  She will also avoid reaching overhead or out to the side with her right arm.  Will see her in the office in 2 weeks.
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
## 2017-10-07 NOTE — Anesthesia Postprocedure Evaluation (Signed)
Anesthesia Post Note  Patient: SHELVA HETZER  Procedure(s) Performed: CLOSED REDUCTION DISLOCATED RIGHT SHOULDER (Right Shoulder)     Patient location during evaluation: PACU Anesthesia Type: General Level of consciousness: awake and alert Pain management: pain level controlled Vital Signs Assessment: post-procedure vital signs reviewed and stable Respiratory status: spontaneous breathing, nonlabored ventilation, respiratory function stable and patient connected to nasal cannula oxygen Cardiovascular status: blood pressure returned to baseline and stable Postop Assessment: no apparent nausea or vomiting Anesthetic complications: no    Last Vitals:  Vitals:   10/06/17 2338 10/06/17 2345  BP: (!) 125/53 (!) 135/57  Pulse: 86   Resp: 17   Temp: 36.6 C   SpO2: 100%     Last Pain:  Vitals:   10/06/17 2338  TempSrc:   PainSc: 0-No pain                 Aryanah Enslow DAVID
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
## 2017-10-08 ENCOUNTER — Encounter (HOSPITAL_COMMUNITY): Payer: Self-pay | Admitting: General Practice

## 2017-10-08 ENCOUNTER — Other Ambulatory Visit: Payer: Self-pay

## 2017-10-08 ENCOUNTER — Observation Stay (HOSPITAL_COMMUNITY): Payer: Medicare Other

## 2017-10-08 DIAGNOSIS — M7989 Other specified soft tissue disorders: Secondary | ICD-10-CM | POA: Diagnosis not present

## 2017-10-08 DIAGNOSIS — M25572 Pain in left ankle and joints of left foot: Secondary | ICD-10-CM | POA: Diagnosis not present

## 2017-10-08 DIAGNOSIS — S99921A Unspecified injury of right foot, initial encounter: Secondary | ICD-10-CM | POA: Diagnosis not present

## 2017-10-08 DIAGNOSIS — M79671 Pain in right foot: Secondary | ICD-10-CM | POA: Diagnosis not present

## 2017-10-08 DIAGNOSIS — S99912A Unspecified injury of left ankle, initial encounter: Secondary | ICD-10-CM | POA: Diagnosis not present

## 2017-10-08 DIAGNOSIS — S0231XA Fracture of orbital floor, right side, initial encounter for closed fracture: Secondary | ICD-10-CM | POA: Diagnosis not present

## 2017-10-08 LAB — GLUCOSE, CAPILLARY
Glucose-Capillary: 215 mg/dL — ABNORMAL HIGH (ref 65–99)
Glucose-Capillary: 235 mg/dL — ABNORMAL HIGH (ref 65–99)
Glucose-Capillary: 248 mg/dL — ABNORMAL HIGH (ref 65–99)
Glucose-Capillary: 220 mg/dL — ABNORMAL HIGH (ref 65–99)

## 2017-10-08 MED ORDER — SODIUM CHLORIDE 0.9 % IV SOLN
INTRAVENOUS | Status: DC
  Administered 2017-10-08 – 2017-10-10 (×4): via INTRAVENOUS

## 2017-10-08 MED ORDER — ERYTHROMYCIN 5 MG/GM OP OINT
TOPICAL_OINTMENT | OPHTHALMIC | Status: DC
  Administered 2017-10-08 – 2017-10-11 (×30): via OPHTHALMIC
  Administered 2017-10-11: 1 via OPHTHALMIC
  Administered 2017-10-11 – 2017-10-13 (×15): via OPHTHALMIC
  Filled 2017-10-08 (×2): qty 3.5

## 2017-10-08 MED ORDER — IPRATROPIUM-ALBUTEROL 0.5-2.5 (3) MG/3ML IN SOLN
3.0000 mL | Freq: Four times a day (QID) | RESPIRATORY_TRACT | Status: DC | PRN
  Administered 2017-10-10: 3 mL via RESPIRATORY_TRACT
  Filled 2017-10-08: qty 3

## 2017-10-08 MED ORDER — INSULIN GLARGINE 100 UNIT/ML ~~LOC~~ SOLN
10.0000 [IU] | Freq: Every day | SUBCUTANEOUS | Status: DC
  Administered 2017-10-08 – 2017-10-12 (×5): 10 [IU] via SUBCUTANEOUS
  Filled 2017-10-08 (×5): qty 0.1
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
## 2017-10-08 NOTE — Progress Notes (Signed)
Inpatient Diabetes Program Recommendations  AACE/ADA: New Consensus Statement on Inpatient Glycemic Control (2015)  Target Ranges:  Prepandial:   less than 140 mg/dL      Peak postprandial:   less than 180 mg/dL (1-2 hours)      Critically ill patients:  140 - 180 mg/dL   Lab Results  Component Value Date   GLUCAP 235 (H) 10/08/2017   HGBA1C 6.3 09/11/2017    Review of Glycemic ControlResults for GWENDELYN, LANTING (MRN [id number]) as of 10/08/2017 16:01  Ref. Range 10/07/2017 16:49 10/07/2017 21:37 10/08/2017 07:25 10/08/2017 12:13  Glucose-Capillary Latest Ref Range: 65 - 99 mg/dL 240 (H) 197 (H) 215 (H) 235 (H)   Diabetes history: Type 2 DM  Outpatient Diabetes medications:  Glyburide 2.5 mg daily, Metformin 1000 mg breakfast, 1000 mg lunch and 500 mg supper Current orders for Inpatient glycemic control:  Novolog sensitive tid with meals and HS  Inpatient Diabetes Program Recommendations:   While in the hospital, consider adding Lantus 10 units daily.  A1C indicates good control of DM prior to admit.    Thanks,  Adah Perl, RN, BC-ADM Inpatient Diabetes Coordinator Pager [phone] (8a-5p)
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
## 2017-10-08 NOTE — Consult Note (Addendum)
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 arrange.   Please call with any questions.  Sinda Du MD [facility] [phone]

## 2017-10-08 NOTE — Progress Notes (Signed)
Subjective: No issues overnight. Resting comfortably in bed. Pt worries that her cataract implant may be damaged.  Objective: Vital signs in last 24 hours: Temp:  [97.9 F (36.6 C)-98 F (36.7 C)] 98 F (36.7 C) (06/24 0455) Pulse Rate:  [66-83] 68 (06/24 0455) Resp:  [16-17] 16 (06/24 0455) BP: (117-145)/(52-63) 127/63 (06/24 0455) SpO2:  [95 %-100 %] 100 % (06/24 0455)  Physical Exam Constitutional: She appearswell-developedand well-nourished.No distress.  Head:Right forehead hematoma. Facial lacerations repaired by the ER MD. Eyes: Severe right periorbital ecchymosis and edema. Unable to fully evaluate the right pupil and EOM. The left eye is normal. Ears: Normal auricles and EACs.  Mouth/Throat:Oropharynx is clear and moist. Neck:Neck supple.No laceration, mass, or lesion. Cardiovascular:Normal rate,regular rhythm,normal heart soundsand intact distal pulses.  Pulmonary/Chest:Effort normaland breath sounds normal. Norespiratory distress. She exhibitsno tenderness.  Neurological: She isalert and oriented. Skin: Skin iswarmand dry. Nursing noteand vitalsreviewed.   Recent Labs    10/06/17 1526 10/06/17 1537 10/07/17 0517  WBC 19.6*  --  10.7*  HGB 12.8 12.6 10.1*  HCT 39.0 37.0 32.0*  PLT 319  --  261   Recent Labs    10/06/17 1526 10/06/17 1537 10/07/17 1016  NA 137 138 136  K 4.0 3.8 3.9  CL 100* 101 103  CO2 27  --  24  GLUCOSE 112* 107* 276*  BUN 31* 33* 22*  CREATININE 0.93 0.90 1.16*  CALCIUM 10.1  --  9.1    Medications:  I have reviewed the patient's current medications. Scheduled: . acetaminophen  1,000 mg Oral Q6H  . carvedilol  6.25 mg Oral BID WC  . enoxaparin (LOVENOX) injection  40 mg Subcutaneous Q24H  . insulin aspart  0-5 Units Subcutaneous QHS  . insulin aspart  0-9 Units Subcutaneous TID WC   Continuous: . dextrose 5 % and 0.9% NaCl 75 mL/hr at 10/08/17 0345    Assessment/Plan: Facial trauma with right  orbital blowout fractures, right maxillary fractures, and nondisplaced nasal fractures. The facial edema has improved slightly.  Will need to evaluate extraocular motion once the edema subsides. Non-operative at this time. May need ophtho eval as the patient is concerned about her cataract len implant.    LOS: 0 days   Patricia Moore 10/08/2017, 9:05 AM

## 2017-10-09 DIAGNOSIS — F419 Anxiety disorder, unspecified: Secondary | ICD-10-CM | POA: Diagnosis present

## 2017-10-09 DIAGNOSIS — Z85828 Personal history of other malignant neoplasm of skin: Secondary | ICD-10-CM | POA: Diagnosis not present

## 2017-10-09 DIAGNOSIS — M79671 Pain in right foot: Secondary | ICD-10-CM | POA: Diagnosis present

## 2017-10-09 DIAGNOSIS — Z9841 Cataract extraction status, right eye: Secondary | ICD-10-CM | POA: Diagnosis not present

## 2017-10-09 DIAGNOSIS — I10 Essential (primary) hypertension: Secondary | ICD-10-CM | POA: Diagnosis present

## 2017-10-09 DIAGNOSIS — S0101XA Laceration without foreign body of scalp, initial encounter: Secondary | ICD-10-CM | POA: Diagnosis present

## 2017-10-09 DIAGNOSIS — M25572 Pain in left ankle and joints of left foot: Secondary | ICD-10-CM | POA: Diagnosis present

## 2017-10-09 DIAGNOSIS — Z8679 Personal history of other diseases of the circulatory system: Secondary | ICD-10-CM | POA: Diagnosis not present

## 2017-10-09 DIAGNOSIS — E559 Vitamin D deficiency, unspecified: Secondary | ICD-10-CM | POA: Diagnosis present

## 2017-10-09 DIAGNOSIS — Y92481 Parking lot as the place of occurrence of the external cause: Secondary | ICD-10-CM | POA: Diagnosis not present

## 2017-10-09 DIAGNOSIS — Z86010 Personal history of colonic polyps: Secondary | ICD-10-CM | POA: Diagnosis not present

## 2017-10-09 DIAGNOSIS — S43014A Anterior dislocation of right humerus, initial encounter: Secondary | ICD-10-CM | POA: Diagnosis not present

## 2017-10-09 DIAGNOSIS — S0240CA Maxillary fracture, right side, initial encounter for closed fracture: Secondary | ICD-10-CM | POA: Diagnosis present

## 2017-10-09 DIAGNOSIS — E785 Hyperlipidemia, unspecified: Secondary | ICD-10-CM | POA: Diagnosis present

## 2017-10-09 DIAGNOSIS — S022XXA Fracture of nasal bones, initial encounter for closed fracture: Secondary | ICD-10-CM | POA: Diagnosis not present

## 2017-10-09 DIAGNOSIS — E1165 Type 2 diabetes mellitus with hyperglycemia: Secondary | ICD-10-CM | POA: Diagnosis not present

## 2017-10-09 DIAGNOSIS — Z23 Encounter for immunization: Secondary | ICD-10-CM | POA: Diagnosis not present

## 2017-10-09 DIAGNOSIS — M810 Age-related osteoporosis without current pathological fracture: Secondary | ICD-10-CM | POA: Diagnosis present

## 2017-10-09 DIAGNOSIS — Z8249 Family history of ischemic heart disease and other diseases of the circulatory system: Secondary | ICD-10-CM | POA: Diagnosis not present

## 2017-10-09 DIAGNOSIS — Z9842 Cataract extraction status, left eye: Secondary | ICD-10-CM | POA: Diagnosis not present

## 2017-10-09 DIAGNOSIS — I671 Cerebral aneurysm, nonruptured: Secondary | ICD-10-CM | POA: Diagnosis present

## 2017-10-09 DIAGNOSIS — Z961 Presence of intraocular lens: Secondary | ICD-10-CM | POA: Diagnosis present

## 2017-10-09 DIAGNOSIS — S0231XA Fracture of orbital floor, right side, initial encounter for closed fracture: Secondary | ICD-10-CM | POA: Diagnosis not present

## 2017-10-09 DIAGNOSIS — S0280XA Fracture of other specified skull and facial bones, unspecified side, initial encounter for closed fracture: Secondary | ICD-10-CM | POA: Diagnosis not present

## 2017-10-09 DIAGNOSIS — Z823 Family history of stroke: Secondary | ICD-10-CM | POA: Diagnosis not present

## 2017-10-09 DIAGNOSIS — H11421 Conjunctival edema, right eye: Secondary | ICD-10-CM | POA: Diagnosis present

## 2017-10-09 DIAGNOSIS — Y9301 Activity, walking, marching and hiking: Secondary | ICD-10-CM | POA: Diagnosis present

## 2017-10-09 DIAGNOSIS — S0281XA Fracture of other specified skull and facial bones, right side, initial encounter for closed fracture: Secondary | ICD-10-CM | POA: Diagnosis not present

## 2017-10-09 LAB — GLUCOSE, CAPILLARY
Glucose-Capillary: 145 mg/dL — ABNORMAL HIGH (ref 70–99)
Glucose-Capillary: 131 mg/dL — ABNORMAL HIGH (ref 70–99)
Glucose-Capillary: 137 mg/dL — ABNORMAL HIGH (ref 70–99)
Glucose-Capillary: 168 mg/dL — ABNORMAL HIGH (ref 70–99)

## 2017-10-09 LAB — CBC
HCT: 28.7 % — ABNORMAL LOW (ref 36.0–46.0)
Hemoglobin: 8.9 g/dL — ABNORMAL LOW (ref 12.0–15.0)
MCH: 30.4 pg (ref 26.0–34.0)
MCHC: 31 g/dL (ref 30.0–36.0)
MCV: 98 fL (ref 78.0–100.0)
Platelets: 225 10*3/uL (ref 150–400)
RBC: 2.93 MIL/uL — ABNORMAL LOW (ref 3.87–5.11)
RDW: 12.5 % (ref 11.5–15.5)
WBC: 13.5 10*3/uL — ABNORMAL HIGH (ref 4.0–10.5)

## 2017-10-09 LAB — COMPREHENSIVE METABOLIC PANEL
ALT: 25 U/L (ref 0–44)
AST: 31 U/L (ref 15–41)
Albumin: 2.8 g/dL — ABNORMAL LOW (ref 3.5–5.0)
Alkaline Phosphatase: 47 U/L (ref 38–126)
Anion gap: 7 (ref 5–15)
BUN: 18 mg/dL (ref 8–23)
CO2: 27 mmol/L (ref 22–32)
Calcium: 8.3 mg/dL — ABNORMAL LOW (ref 8.9–10.3)
Chloride: 105 mmol/L (ref 98–111)
Creatinine, Ser: 0.98 mg/dL (ref 0.44–1.00)
GFR calc Af Amer: >60 mL/min (ref >60)
GFR calc non Af Amer: 53 mL/min — ABNORMAL LOW (ref >60)
Glucose, Bld: 135 mg/dL — ABNORMAL HIGH (ref 70–99)
Potassium: 4.2 mmol/L (ref 3.5–5.1)
Sodium: 139 mmol/L (ref 135–145)
Total Bilirubin: 0.5 mg/dL (ref 0.3–1.2)
Total Protein: 5.7 g/dL — ABNORMAL LOW (ref 6.5–8.1)

## 2017-10-09 MED ORDER — HYDROMORPHONE HCL 2 MG/ML IJ SOLN: 0.5000 mg | INTRAMUSCULAR | Status: DC | PRN

## 2017-10-09 MED ORDER — HYDROMORPHONE HCL 1 MG/ML IJ SOLN
0.5000 mg | INTRAMUSCULAR | Status: DC | PRN
  Administered 2017-10-09 – 2017-10-12 (×4): 1 mg via INTRAVENOUS
  Filled 2017-10-09 (×4): qty 1

## 2017-10-09 MED ORDER — TRAMADOL HCL 50 MG PO TABS
50.0000 mg | ORAL_TABLET | Freq: Four times a day (QID) | ORAL | Status: DC | PRN
  Administered 2017-10-09 – 2017-10-13 (×6): 50 mg via ORAL
  Filled 2017-10-09 (×6): qty 1
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
## 2017-10-09 NOTE — Progress Notes (Signed)
OT Cancellation Note  Patient Details Name: Patricia Moore MRN: [id number] DOB: 12/29/1933   Cancelled Treatment:    Reason Eval/Treat Not Completed: Patient declined, no reason specified (son requesting not to wake patient at this time and let her sleep for at least 1 hour on OT arrival) OT placed a sign on the door to meet patients family request ( no visitors for 1 hours and to see RN. RN staff notified of request)  Vonita Moss   OTR/L Pager: [phone] Office: [phone] .  10/09/2017, 1:04 PM
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
## 2017-10-09 NOTE — Progress Notes (Signed)
Subjective: No issues overnight. Right periorbital edema has slightly improved.  Objective: Vital signs in last 24 hours: Temp:  [98 F (36.7 C)-98.6 F (37 C)] 98.6 F (37 C) (06/25 1030) Pulse Rate:  [71-75] 71 (06/25 1030) Resp:  [15-18] 15 (06/25 1030) BP: (118-126)/(48-51) 118/49 (06/25 1030) SpO2:  [95 %-97 %] 96 % (06/25 1030)  Physical Exam Constitutional: She appearswell-developedand well-nourished.No distress.  Head:Right forehead hematoma. Facial lacerations repaired. Eyes: Moderate right periorbital ecchymosis and edema. Right eye vision intact. No difficulty with lateral and medial eye movement. Slight up and down movement noted. The left eye is normal. Ears: Normal auricles and EACs. Mouth/Throat:Oropharynx is clear and moist. Neck:Neck supple.No laceration, mass, or lesion. Cardiovascular:Normal rate,regular rhythm,normal heart soundsand intact distal pulses.  Pulmonary/Chest:Effort normaland breath sounds normal. Norespiratory distress. She exhibitsno tenderness.  Neurological: She isalertand oriented. Skin: Skin iswarmand dry. Nursing noteand vitalsreviewed.  Recent Labs    10/07/17 0517 10/09/17 0512  WBC 10.7* 13.5*  HGB 10.1* 8.9*  HCT 32.0* 28.7*  PLT 261 225   Recent Labs    10/07/17 1016 10/09/17 0512  NA 136 139  K 3.9 4.2  CL 103 105  CO2 24 27  GLUCOSE 276* 135*  BUN 22* 18  CREATININE 1.16* 0.98  CALCIUM 9.1 8.3*    Medications:  I have reviewed the patient's current medications. Scheduled: . acetaminophen  1,000 mg Oral Q6H  . carvedilol  6.25 mg Oral BID WC  . enoxaparin (LOVENOX) injection  40 mg Subcutaneous Q24H  . erythromycin   Right Eye Q2H  . insulin aspart  0-5 Units Subcutaneous QHS  . insulin aspart  0-9 Units Subcutaneous TID WC  . insulin glargine  10 Units Subcutaneous QHS   Continuous: . sodium chloride 75 mL/hr at 10/09/17 3212    Assessment/Plan: Facial trauma with right orbital  blowout fractures, right maxillary fractures, and nondisplaced nasal fractures. The facial edema has improved moderately.  CT showed no muscle entrapment. Slight vertical right eye movement is noted today. Will continue to observe conservatively. If pt has residual extra-ocular motion limitation after the edema subsides, will proceed with surgical repair of the orbital floor.  Abdulahad Mederos W Markesia Crilly 10/09/2017, 12:23 PM
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
## 2017-10-10 LAB — GLUCOSE, CAPILLARY
Glucose-Capillary: 138 mg/dL — ABNORMAL HIGH (ref 70–99)
Glucose-Capillary: 146 mg/dL — ABNORMAL HIGH (ref 70–99)
Glucose-Capillary: 184 mg/dL — ABNORMAL HIGH (ref 70–99)
Glucose-Capillary: 134 mg/dL — ABNORMAL HIGH (ref 70–99)

## 2017-10-10 LAB — CBC
HCT: 29 % — ABNORMAL LOW (ref 36.0–46.0)
Hemoglobin: 9.2 g/dL — ABNORMAL LOW (ref 12.0–15.0)
MCH: 30.6 pg (ref 26.0–34.0)
MCHC: 31.7 g/dL (ref 30.0–36.0)
MCV: 96.3 fL (ref 78.0–100.0)
Platelets: 231 10*3/uL (ref 150–400)
RBC: 3.01 MIL/uL — ABNORMAL LOW (ref 3.87–5.11)
RDW: 12.5 % (ref 11.5–15.5)
WBC: 13.6 10*3/uL — ABNORMAL HIGH (ref 4.0–10.5)

## 2017-10-10 MED ORDER — TAB-A-VITE/IRON PO TABS
1.0000 | ORAL_TABLET | Freq: Every day | ORAL | Status: DC
  Administered 2017-10-10 – 2017-10-11 (×2): 1 via ORAL
  Filled 2017-10-10 (×4): qty 1

## 2017-10-10 MED ORDER — IBUPROFEN 400 MG PO TABS
400.0000 mg | ORAL_TABLET | Freq: Three times a day (TID) | ORAL | Status: DC
  Administered 2017-10-10 – 2017-10-13 (×10): 400 mg via ORAL
  Filled 2017-10-10 (×10): qty 1

## 2017-10-10 MED ORDER — BACITRACIN ZINC 500 UNIT/GM EX OINT
TOPICAL_OINTMENT | Freq: Two times a day (BID) | CUTANEOUS | Status: DC
  Administered 2017-10-10 (×2): via TOPICAL
  Administered 2017-10-11: 1 via TOPICAL
  Administered 2017-10-11 – 2017-10-13 (×4): via TOPICAL
  Filled 2017-10-10: qty 28.35
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
## 2017-10-10 NOTE — Plan of Care (Signed)
  Problem: Activity: Goal: Risk for activity intolerance will decrease Outcome: Progressing   Problem: Nutrition: Goal: Adequate nutrition will be maintained Outcome: Progressing   Problem: Pain Managment: Goal: General experience of comfort will improve Outcome: Progressing   Problem: Safety: Goal: Ability to remain free from injury will improve Outcome: Progressing   

## 2017-10-10 NOTE — Progress Notes (Signed)
Inpatient Rehabilitation Admissions Coordinator  I received a call from [facility] stating that family wish for patient to d/c home. I will cancel inpt rehab consult at this time.  Danne Baxter, RN, MSN Rehab Admissions Coordinator [phone] 10/10/2017 7:23 PM
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
## 2017-10-10 NOTE — Progress Notes (Signed)
Inpatient Rehabilitation Admissions Coordinator  Asked by RN CM, Almyra Free, to assess pt's needs for rehab per Dr. Richarda Blade request. Noted patient is min assist 65 feet and min with OT. I will ask rehab MD to weigh in on their recommendations.  Patricia Baxter, RN, MSN Rehab Admissions Coordinator [phone] 10/10/2017 4:51 PM
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
## 2017-10-11 DIAGNOSIS — S0231XA Fracture of orbital floor, right side, initial encounter for closed fracture: Secondary | ICD-10-CM

## 2017-10-11 LAB — CBC
HCT: 30.5 % — ABNORMAL LOW (ref 36.0–46.0)
Hemoglobin: 9.7 g/dL — ABNORMAL LOW (ref 12.0–15.0)
MCH: 30.5 pg (ref 26.0–34.0)
MCHC: 31.8 g/dL (ref 30.0–36.0)
MCV: 95.9 fL (ref 78.0–100.0)
Platelets: 249 10*3/uL (ref 150–400)
RBC: 3.18 MIL/uL — ABNORMAL LOW (ref 3.87–5.11)
RDW: 12.4 % (ref 11.5–15.5)
WBC: 12.7 10*3/uL — ABNORMAL HIGH (ref 4.0–10.5)

## 2017-10-11 LAB — BASIC METABOLIC PANEL
Anion gap: 10 (ref 5–15)
BUN: 21 mg/dL (ref 8–23)
CO2: 25 mmol/L (ref 22–32)
Calcium: 8.8 mg/dL — ABNORMAL LOW (ref 8.9–10.3)
Chloride: 104 mmol/L (ref 98–111)
Creatinine, Ser: 0.9 mg/dL (ref 0.44–1.00)
GFR calc Af Amer: >60 mL/min (ref >60)
GFR calc non Af Amer: 58 mL/min — ABNORMAL LOW (ref >60)
Glucose, Bld: 240 mg/dL — ABNORMAL HIGH (ref 70–99)
Potassium: 4.2 mmol/L (ref 3.5–5.1)
Sodium: 139 mmol/L (ref 135–145)

## 2017-10-11 LAB — GLUCOSE, CAPILLARY
Glucose-Capillary: 122 mg/dL — ABNORMAL HIGH (ref 70–99)
Glucose-Capillary: 194 mg/dL — ABNORMAL HIGH (ref 70–99)
Glucose-Capillary: 239 mg/dL — ABNORMAL HIGH (ref 70–99)
Glucose-Capillary: 187 mg/dL — ABNORMAL HIGH (ref 70–99)

## 2017-10-11 MED ORDER — DOCUSATE SODIUM 100 MG PO CAPS
100.0000 mg | ORAL_CAPSULE | Freq: Two times a day (BID) | ORAL | Status: DC
  Administered 2017-10-11 – 2017-10-12 (×3): 100 mg via ORAL
  Filled 2017-10-11 (×4): qty 1

## 2017-10-11 MED ORDER — IPRATROPIUM-ALBUTEROL 0.5-2.5 (3) MG/3ML IN SOLN: 3.0000 mL | Freq: Two times a day (BID) | RESPIRATORY_TRACT | Status: DC

## 2017-10-11 MED ORDER — POLYETHYLENE GLYCOL 3350 17 G PO PACK
17.0000 g | PACK | Freq: Every day | ORAL | Status: DC
  Administered 2017-10-11 – 2017-10-13 (×3): 17 g via ORAL
  Filled 2017-10-11 (×3): qty 1

## 2017-10-11 MED ORDER — HYDROCORTISONE 1 % EX CREA
TOPICAL_CREAM | CUTANEOUS | Status: DC | PRN
  Administered 2017-10-12: via TOPICAL
  Filled 2017-10-11: qty 28

## 2017-10-11 MED ORDER — IPRATROPIUM-ALBUTEROL 0.5-2.5 (3) MG/3ML IN SOLN
3.0000 mL | Freq: Four times a day (QID) | RESPIRATORY_TRACT | Status: DC
  Administered 2017-10-11 (×2): 3 mL via RESPIRATORY_TRACT
  Filled 2017-10-11 (×3): qty 3
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
## 2017-10-11 NOTE — Progress Notes (Signed)
Orthopedic Tech Progress Note Patient Details:  Patricia Moore 01-08-1934 [id number]  Ortho Devices Type of Ortho Device: ASO Ortho Device/Splint Location: lle Ortho Device/Splint Interventions: Application   Post Interventions Patient Tolerated: Well Instructions Provided: Care of device   Hildred Priest 10/11/2017, 10:58 AM
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
## 2017-10-11 NOTE — Progress Notes (Signed)
Patient suffers from Lt foot pain which impairs their ability to perform daily activities like ambulating in the home. A rolling walker will not resolve  issue with performing activities of daily living. A wheelchair will allow patient to safely perform daily activities. Patient can safely propel the wheelchair in the home or has a caregiver who can provide assistance.  Accessories: elevating leg rests (ELRs), wheel locks, extensions and anti-tippers.  Wellington Hampshire, [facility] 10/11/2017, 4:15 PM Pager: [phone] Consults: [phone] Mon 7:00 am -11:30 AM Tues-Fri 7:00 am-4:30 pm Sat-Sun 7:00 am-11:30 am
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
## 2017-10-11 NOTE — Plan of Care (Signed)
  Problem: Safety: Goal: Ability to remain free from injury will improve Outcome: Progressing   
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
## 2017-10-12 ENCOUNTER — Ambulatory Visit: Payer: Medicare Other | Admitting: Family

## 2017-10-12 ENCOUNTER — Other Ambulatory Visit: Payer: Medicare Other

## 2017-10-12 LAB — GLUCOSE, CAPILLARY
Glucose-Capillary: 118 mg/dL — ABNORMAL HIGH (ref 70–99)
Glucose-Capillary: 188 mg/dL — ABNORMAL HIGH (ref 70–99)
Glucose-Capillary: 158 mg/dL — ABNORMAL HIGH (ref 70–99)
Glucose-Capillary: 182 mg/dL — ABNORMAL HIGH (ref 70–99)

## 2017-10-12 MED ORDER — DOCUSATE SODIUM 100 MG PO CAPS: 100.0000 mg | ORAL_CAPSULE | Freq: Two times a day (BID) | ORAL | 0 refills | Status: DC

## 2017-10-12 MED ORDER — ACETAMINOPHEN 500 MG PO TABS: 1000.0000 mg | ORAL_TABLET | Freq: Four times a day (QID) | ORAL | 0 refills | Status: DC | PRN

## 2017-10-12 MED ORDER — IBUPROFEN 400 MG PO TABS: 400.0000 mg | ORAL_TABLET | Freq: Three times a day (TID) | ORAL | 0 refills | Status: DC

## 2017-10-12 MED ORDER — HYDROMORPHONE HCL 1 MG/ML IJ SOLN
0.5000 mg | INTRAMUSCULAR | Status: DC | PRN
Start: 2017-10-12 — End: 2017-10-13

## 2017-10-12 MED ORDER — ERYTHROMYCIN 5 MG/GM OP OINT: TOPICAL_OINTMENT | OPHTHALMIC | 0 refills | Status: DC

## 2017-10-12 MED ORDER — TRAMADOL HCL 50 MG PO TABS: 50.0000 mg | ORAL_TABLET | Freq: Four times a day (QID) | ORAL | 0 refills | Status: DC | PRN

## 2017-10-12 MED ORDER — POLYETHYLENE GLYCOL 3350 17 G PO PACK: 17.0000 g | PACK | Freq: Every day | ORAL | 0 refills | Status: DC

## 2017-10-12 MED ORDER — ALBUTEROL SULFATE HFA 108 (90 BASE) MCG/ACT IN AERS: 2.0000 | INHALATION_SPRAY | Freq: Four times a day (QID) | RESPIRATORY_TRACT | 0 refills | Status: DC | PRN

## 2017-10-12 MED ORDER — IPRATROPIUM-ALBUTEROL 0.5-2.5 (3) MG/3ML IN SOLN: 3.0000 mL | Freq: Four times a day (QID) | RESPIRATORY_TRACT | Status: DC | PRN

## 2017-10-12 MED ORDER — IPRATROPIUM-ALBUTEROL 0.5-2.5 (3) MG/3ML IN SOLN
3.0000 mL | Freq: Three times a day (TID) | RESPIRATORY_TRACT | Status: DC
  Administered 2017-10-12: 3 mL via RESPIRATORY_TRACT
  Filled 2017-10-12: qty 3

## 2017-10-12 MED ORDER — BACITRACIN ZINC 500 UNIT/GM EX OINT: TOPICAL_OINTMENT | Freq: Two times a day (BID) | CUTANEOUS | 0 refills | Status: DC

## 2017-10-12 MED ORDER — IPRATROPIUM-ALBUTEROL 0.5-2.5 (3) MG/3ML IN SOLN
3.0000 mL | Freq: Three times a day (TID) | RESPIRATORY_TRACT | Status: AC
  Administered 2017-10-12 – 2017-10-13 (×2): 3 mL via RESPIRATORY_TRACT
  Filled 2017-10-12 (×2): qty 3

## 2017-10-12 MED ORDER — TAB-A-VITE/IRON PO TABS: 1.0000 | ORAL_TABLET | Freq: Every day | ORAL | 0 refills | Status: DC
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
## 2017-10-12 NOTE — Discharge Instructions (Signed)
For your shoulder injury: You may come out of your sling to shower and to occasionally move your elbow, wrist and hand. Do not attempt to reach over head or behind with your right arm. Do sleep in the sling. Expect swelling - intermittent ice as needed.  For your facial injury: Follow up in 1 week for repeat evaluation Continue using erythromycin every 2 hours to the right eye   Opioid Pain Medicine Information Opioids are powerful medicines that are used to treat moderate to severe pain. Opioids should be taken with the supervision of a trained health care provider. They should be taken for the shortest period of time as possible. This is because opioids can be addictive and the longer you take opioids, the greater your risk of addiction (opioid use disorder). What do opioids do? Opioids help to reduce or eliminate pain. When used for short periods of time, they can help you:  Sleep better.  Do better in physical or occupational therapy.  Feel better in the first few days after an injury.  Recover from surgery.  What is a pain treatment plan? A pain treatment plan is an agreement between you and your health care provider. Pain is unique to each person, and treatments vary depending on your condition. To manage your pain successfully, you and your health care provider need to understand each other and work together. To help you do this:  Discuss the goals of your treatment, including how much pain you might expect to have and how you will manage the pain.  Review the risks and benefits of taking opioid medicines for your condition.  Remember that a good treatment plan uses more than one approach and minimizes the chance of side effects.  Be honest about the amount of medicines you take, and about any drug or alcohol use.  Get pain medicine prescriptions from only one care provider.  Keep all follow-up visits as told by your health care provider. This is important.  What  instructions should I follow while taking opioid pain medicine? While you are taking the medicine and for 8 hours after you stop taking the medicine, follow these instructions:  Do not drive.  Do not use machinery or power tools.  Do not sign legal documents.  Do not drink alcohol.  Do not take sleeping pills.  Do not supervise children by yourself.  Do not participate in activities that require climbing or being in high places.  Do not enter a body of water--such as a lake, river, ocean, spa, or swimming pool--unless an adult is nearby who can monitor and help you.  What kinds of side effects can opioids cause? Opioids can cause side effects, such as:  Constipation.  Nausea.  Vomiting.  Drowsiness.  Confusion.  Opioid use disorder.  Breathing difficulties (respiratory depression).  Using opioid pain medicines for longer than 3 days increases your risk of these side effects. Taking opioid pain medicine for a long period of time can affect your ability to do daily tasks. It also puts you at risk for:  Motor vehicle accidents.  Depression.  Suicide.  Heart attack.  Overdose, which can sometimes lead to death.  What are alternative ways to manage pain? Pain can be managed with many types of alternative treatments. Ask your health care provider to refer you to one or more specialists who can help you manage pain through:  Physical or occupational therapy.  Counseling (cognitive behavioral therapy).  Good nutrition.  Biofeedback.  Massage.  Meditation.  Non-opioid medicine.  Following a gentle exercise program.  How can I keep others safe while I am taking opioid pain medicine?  Keep pain medicine in a locked cabinet, or in a secure area where children cannot reach it.  Never share your pain medicine with anyone.  Do not save any leftover pills. If you have leftover medicine, you can: 1. Bring the medicine to a prescription take-back program. This  is usually offered by the county or Event organiser. 2. Throw it out in the trash. To do this:  Mix the medicine with undesirable trash such as pet waste or food.  Put the mixture in a sealed container or plastic bag.  Throw it in the trash.  Destroy any personal information on the prescription bottle. How do I stop taking opioids if I have been taking them for a long time? If you have been taking opioid medicine for more than a few weeks, you may need to slowly decrease (taper) how much you take until you stop completely. Tapering your use of opioids can decrease your chances of experiencing withdrawal symptoms, such as:  Pain and cramping in the abdomen.  Nausea.  Sweating.  Sleepiness.  Restlessness.  Uncontrollable shaking (tremors).  Cravings for the medicine.  Do not attempt to taper your use of opioids on your own. Talk with your health care provider about how to do this. Your health care provider may prescribe a step-down schedule based on how much medicine you are taking and how long you have been taking it. Where to find support: If you have been taking opioids for a long time, you may benefit from receiving support for quitting from a local support group or counselor. Ask your health care provider for a referral to these resources in your area. Where to find more information:  Centers for Disease Control and Prevention (CDC): [employer] Get help right away if: Seek medical care right away if you are taking opioids and you (or people close to you) notice any of the following:  Difficulty breathing.  Breathing that is slower or more shallow than normal.  A very slow heartbeat (pulse).  Severe confusion.  Unconsciousness.  Sleepiness.  Slurred speech.  Nausea and vomiting.  Cold, clammy skin.  Blue lips or fingernails.  Limpness.  Abnormally small pupils.  If you think that you or someone else may have taken too much  of an opioid medicine, get medical help right away. Do not wait to see if the symptoms go away on their own.  If you ever feel like you may hurt yourself or others, or have thoughts about taking your own life, get help right away. You can go to your nearest emergency department or call:  Your local emergency services (911 in the [place]  The hotline of the [facility] 330-038-1473 in the [place]  A suicide crisis helpline, such as the [place] at 5151582001. This is open 24 hours a day.  Summary  Opioid medicines can help you manage moderate to severe pain for a short period of time.  Discuss the goals of your treatment with your health care provider, including how much pain you might expect to have and how you will manage the pain.  A good treatment plan uses more than one approach. Pain can be managed with many types of alternative treatments.  If you think that you or someone else may have taken too much of an opioid, get medical help right away. This information  is not intended to replace advice given to you by your health care provider. Make sure you discuss any questions you have with your health care provider. Document Released: 04/30/2015 Document Revised: 07/21/2016 Document Reviewed: 11/13/2014 Elsevier Interactive Patient Education  [employer].
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
## 2017-10-13 DIAGNOSIS — Z23 Encounter for immunization: Secondary | ICD-10-CM | POA: Diagnosis not present

## 2017-10-13 LAB — GLUCOSE, CAPILLARY
Glucose-Capillary: 164 mg/dL — ABNORMAL HIGH (ref 70–99)
Glucose-Capillary: 173 mg/dL — ABNORMAL HIGH (ref 70–99)
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
## 2017-10-13 NOTE — Progress Notes (Signed)
Met with RN and PT. Family is requesting a R platform walker. Pt is ready to be D/C today. Contacted Reggie and Jermaine at [facility] for DME referral.

## 2017-10-13 NOTE — Progress Notes (Signed)
PT Cancellation Note  Patient Details Name: Patricia Moore MRN: [id number] DOB: 1933/05/25   Cancelled Treatment:    Reason Eval/Treat Not Completed: Patient declined, no reason specified  Spoke with patient and family. They have decided that they want her to use a platform walker at discharge. Declined to practice with SPC. They do not feel safe with her using quad cane and again, declined to practice with this device. They have no further questions or concerns for physical therapy regarding safety or mobility.  Ellouise Newer 10/13/2017, 1:58 PM   ..Elayne Snare, PT, DPT

## 2017-10-15 ENCOUNTER — Telehealth: Payer: Self-pay | Admitting: Neurology

## 2017-10-15 ENCOUNTER — Telehealth: Payer: Self-pay

## 2017-10-15 NOTE — Telephone Encounter (Signed)
Patricia Moore called, LMOVM. She said Pt is having new sx's since d/c from hospital. Extreme dizzines and nausea.   Called and spoke with Patricia Moore. She is concerned with new sx's.

## 2017-10-15 NOTE — Telephone Encounter (Signed)
Daughter-in-law has called back, wanting to know if patient can be seen sooner, she is having occasional headaches, dizziness and nausea. She also ask if there was anyway to order test like an MRI before she comes in so that you would have those results. Please call patient back when available.

## 2017-10-15 NOTE — Telephone Encounter (Signed)
Called and LMOVM advising we will place on cx list and contact them with an available OV. Pt will need to be seen before any imaging ordered.

## 2017-10-15 NOTE — Telephone Encounter (Signed)
Cancellation list

## 2017-10-15 NOTE — Telephone Encounter (Signed)
Opened in error

## 2017-10-15 NOTE — Telephone Encounter (Signed)
10/15/17  Transition Care Management Follow-up Telephone Call  ADMISSION DATE: 10/06/17 DISCHARGE DATE: 10/13/17  How have you been since you were released from the hospital? Patient states she is in a lot of pain.  Do you understand why you were in the hospital? Yes   Do you understand the discharge instrcutions? Yes  Items Reviewed:  Medications reviewed: Yes. Asked patient to bring medications in with her when she comes. Appears confused about some of them.   Allergies reviewed: NKDA   Dietary changes reviewed: Diabetic diet  Referrals reviewed:Appointment scheduled with Dr. Larose Kells. Patient has appointment scheduled with surgeon.    Functional Questionnaire:   Activities of Daily Living (ADLs): Patient has help with all ADL's at this time.  Any patient concerns? Will discuss when she comes in for appointment.  Confirmed importance and date/time of follow-up visits scheduled:Yes  Confirmed with patient if condition begins to worsen call PCP or go to the ER. Yes   Patient was given the office number and encouragred to call back with questions or concerns.Yes

## 2017-10-15 NOTE — Telephone Encounter (Signed)
PA initiated via Covermymeds; KEY: [id number]. Awaiting determination.

## 2017-10-15 NOTE — Telephone Encounter (Signed)
I wouldn't be able to order imaging without first an evaluation.  Put her at top of the wait list.  There are cancellations all the time and most definitely this week.

## 2017-10-16 ENCOUNTER — Encounter: Payer: Self-pay | Admitting: Internal Medicine

## 2017-10-16 ENCOUNTER — Ambulatory Visit (INDEPENDENT_AMBULATORY_CARE_PROVIDER_SITE_OTHER): Payer: Medicare Other | Admitting: Internal Medicine

## 2017-10-16 DIAGNOSIS — S0292XD Unspecified fracture of facial bones, subsequent encounter for fracture with routine healing: Secondary | ICD-10-CM

## 2017-10-16 DIAGNOSIS — S43014D Anterior dislocation of right humerus, subsequent encounter: Secondary | ICD-10-CM | POA: Diagnosis not present

## 2017-10-16 DIAGNOSIS — D649 Anemia, unspecified: Secondary | ICD-10-CM

## 2017-10-16 MED ORDER — MECLIZINE HCL 25 MG PO TABS: 25.0000 mg | ORAL_TABLET | Freq: Three times a day (TID) | ORAL | 0 refills | Status: DC | PRN
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
## 2017-10-16 NOTE — Progress Notes (Signed)
Pre visit review using our clinic review tool, if applicable. No additional management support is needed unless otherwise documented below in the visit note. 

## 2017-10-16 NOTE — Telephone Encounter (Signed)
Called and spoke with Izora Gala, advised her of cx list priority. Pt is seeing Dr Larose Kells this afternnoon and will have the notes sent to [place].  Spoke with Hinton Dyer in reception, she will call if any appts open.

## 2017-10-16 NOTE — Telephone Encounter (Signed)
PA approved.

## 2017-10-16 NOTE — Patient Instructions (Addendum)
GO TO THE LAB : Get the blood work     GO TO THE FRONT DESK Schedule your next appointment for a checkup in 3 months  For pain: Tramadol as needed Tylenol as needed Okay to take Aleve 1 tablet daily, with food.  For dizziness: Okay to use Dramamine, diazepam or Antivert.  Go to the ER or call if: You have a very unusual or severe headache, severe dizziness.  Check the  blood pressure 2 or 3 times a  week Be sure your blood pressure is between 110/65 and  135/85. If it is consistently higher or lower, let me know

## 2017-10-17 LAB — CBC WITH DIFFERENTIAL/PLATELET
Basophils Absolute: 0.2 10*3/uL — ABNORMAL HIGH (ref 0.0–0.1)
Basophils Relative: 1.5 % (ref 0.0–3.0)
Eosinophils Absolute: 1.8 10*3/uL — ABNORMAL HIGH (ref 0.0–0.7)
Eosinophils Relative: 14.3 % — ABNORMAL HIGH (ref 0.0–5.0)
HCT: 31.5 % — ABNORMAL LOW (ref 36.0–46.0)
Hemoglobin: 10.5 g/dL — ABNORMAL LOW (ref 12.0–15.0)
Lymphocytes Relative: 22.8 % (ref 12.0–46.0)
Lymphs Abs: 2.9 10*3/uL (ref 0.7–4.0)
MCHC: 33.4 g/dL (ref 30.0–36.0)
MCV: 92.9 fl (ref 78.0–100.0)
Monocytes Absolute: 1 10*3/uL (ref 0.1–1.0)
Monocytes Relative: 8.2 % (ref 3.0–12.0)
Neutro Abs: 6.7 10*3/uL (ref 1.4–7.7)
Neutrophils Relative %: 53.2 % (ref 43.0–77.0)
Platelets: 427 10*3/uL — ABNORMAL HIGH (ref 150.0–400.0)
RBC: 3.39 Mil/uL — ABNORMAL LOW (ref 3.87–5.11)
RDW: 13.8 % (ref 11.5–15.5)
WBC: 12.6 10*3/uL — ABNORMAL HIGH (ref 4.0–10.5)

## 2017-10-17 NOTE — Assessment & Plan Note (Signed)
Multiple contusions, facial fractures:  Recently admitted to the hospital, here for follow-up, she also has follow-up with ENT and orthopedic surgery. She seems to be recovering well, her degree of pain, headache and dizziness seems in proportion to her injuries. I recommend them to definitely call or go to the ER if she has a severe or unusual headache, dizziness or nausea. Pain management with Ultram, Tylenol, recommend to limit NSAIDs to 1 time a day due to risk of GI bleed. Parking permit signed. For the management of dizziness she will use Dramamine, she still have some diazepam from before ; also ok to use   Antivert. She was noted to have anemia, recheck a CBC today Was noted to have some wheezing on the hospital, the patient's daughter reported that she is great and has not required albuterol. DM: Back on her medications, reports appetite is excellent, ambulatory CBGs at home 130 today RTC 3 months

## 2017-10-19 DIAGNOSIS — S0280XD Fracture of other specified skull and facial bones, unspecified side, subsequent encounter for fracture with routine healing: Secondary | ICD-10-CM | POA: Diagnosis not present

## 2017-10-23 ENCOUNTER — Telehealth: Payer: Self-pay | Admitting: *Deleted

## 2017-10-23 DIAGNOSIS — S0240CD Maxillary fracture, right side, subsequent encounter for fracture with routine healing: Secondary | ICD-10-CM | POA: Diagnosis not present

## 2017-10-23 DIAGNOSIS — S0280XD Fracture of other specified skull and facial bones, unspecified side, subsequent encounter for fracture with routine healing: Secondary | ICD-10-CM | POA: Diagnosis not present

## 2017-10-23 DIAGNOSIS — M1991 Primary osteoarthritis, unspecified site: Secondary | ICD-10-CM | POA: Diagnosis not present

## 2017-10-23 DIAGNOSIS — I10 Essential (primary) hypertension: Secondary | ICD-10-CM | POA: Diagnosis not present

## 2017-10-23 DIAGNOSIS — S022XXD Fracture of nasal bones, subsequent encounter for fracture with routine healing: Secondary | ICD-10-CM | POA: Diagnosis not present

## 2017-10-23 DIAGNOSIS — Z7982 Long term (current) use of aspirin: Secondary | ICD-10-CM | POA: Diagnosis not present

## 2017-10-23 DIAGNOSIS — S43014D Anterior dislocation of right humerus, subsequent encounter: Secondary | ICD-10-CM | POA: Diagnosis not present

## 2017-10-23 DIAGNOSIS — S060X9D Concussion with loss of consciousness of unspecified duration, subsequent encounter: Secondary | ICD-10-CM | POA: Diagnosis not present

## 2017-10-23 DIAGNOSIS — H11421 Conjunctival edema, right eye: Secondary | ICD-10-CM | POA: Diagnosis not present

## 2017-10-23 DIAGNOSIS — E785 Hyperlipidemia, unspecified: Secondary | ICD-10-CM | POA: Diagnosis not present

## 2017-10-23 NOTE — Telephone Encounter (Signed)
Received Physician Orders from [facility]; forwarded to provider/SLS 07/09

## 2017-10-24 ENCOUNTER — Telehealth: Payer: Self-pay | Admitting: Internal Medicine

## 2017-10-24 NOTE — Telephone Encounter (Signed)
Spoke w/ Leslie- verbal orders given.  

## 2017-10-24 NOTE — Telephone Encounter (Signed)
Signed and faxed to [facility] [phone]. Form sent for scanning.

## 2017-10-24 NOTE — Telephone Encounter (Signed)
Copied from [place] [id number]. Topic: Quick Communication - See Telephone Encounter >> Oct 24, 2017  9:12 AM Conception Chancy, NT wrote: CRM for notification. See Telephone encounter for: 10/24/17.  Magda Paganini is a physical therapist calling with [facility] and states the hospital gave her an order for physical therapy but is requesting verbals for 1x a week for 2 weeks and 2x a week for 1 week.  Cb# [phone]

## 2017-10-26 ENCOUNTER — Ambulatory Visit: Payer: Medicare Other | Admitting: Neurology

## 2017-10-26 ENCOUNTER — Encounter: Payer: Self-pay | Admitting: Neurology

## 2017-10-26 DIAGNOSIS — S060X1A Concussion with loss of consciousness of 30 minutes or less, initial encounter: Secondary | ICD-10-CM | POA: Diagnosis not present
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
## 2017-10-26 NOTE — Patient Instructions (Signed)
Continue current management. Follow up in 4 months.

## 2017-10-29 ENCOUNTER — Encounter

## 2017-10-29 ENCOUNTER — Ambulatory Visit: Payer: Medicare Other | Admitting: Neurology

## 2017-10-30 ENCOUNTER — Encounter (INDEPENDENT_AMBULATORY_CARE_PROVIDER_SITE_OTHER): Payer: Self-pay | Admitting: Orthopaedic Surgery

## 2017-10-30 ENCOUNTER — Ambulatory Visit (INDEPENDENT_AMBULATORY_CARE_PROVIDER_SITE_OTHER): Payer: Medicare Other | Admitting: Orthopaedic Surgery

## 2017-10-30 DIAGNOSIS — S43014D Anterior dislocation of right humerus, subsequent encounter: Secondary | ICD-10-CM

## 2017-10-30 MED ORDER — TRAMADOL HCL 50 MG PO TABS: 50.0000 mg | ORAL_TABLET | Freq: Four times a day (QID) | ORAL | 0 refills | Status: DC | PRN
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
## 2017-10-31 ENCOUNTER — Encounter: Payer: Self-pay | Admitting: Internal Medicine

## 2017-10-31 DIAGNOSIS — E119 Type 2 diabetes mellitus without complications: Secondary | ICD-10-CM | POA: Diagnosis not present

## 2017-10-31 DIAGNOSIS — H26493 Other secondary cataract, bilateral: Secondary | ICD-10-CM | POA: Diagnosis not present

## 2017-10-31 DIAGNOSIS — H43812 Vitreous degeneration, left eye: Secondary | ICD-10-CM | POA: Diagnosis not present

## 2017-10-31 DIAGNOSIS — H401131 Primary open-angle glaucoma, bilateral, mild stage: Secondary | ICD-10-CM | POA: Diagnosis not present

## 2017-10-31 DIAGNOSIS — H532 Diplopia: Secondary | ICD-10-CM | POA: Diagnosis not present

## 2017-11-02 ENCOUNTER — Telehealth: Payer: Self-pay | Admitting: *Deleted

## 2017-11-02 ENCOUNTER — Encounter: Payer: Self-pay | Admitting: Internal Medicine

## 2017-11-02 ENCOUNTER — Ambulatory Visit (INDEPENDENT_AMBULATORY_CARE_PROVIDER_SITE_OTHER): Payer: Medicare Other | Admitting: Internal Medicine

## 2017-11-02 VITALS — BP 140/80 | HR 88 | Temp 98.2°F | Resp 16 | Ht 61.0 in | Wt 141.4 lb

## 2017-11-02 DIAGNOSIS — I10 Essential (primary) hypertension: Secondary | ICD-10-CM | POA: Diagnosis not present

## 2017-11-02 DIAGNOSIS — E119 Type 2 diabetes mellitus without complications: Secondary | ICD-10-CM | POA: Diagnosis not present

## 2017-11-02 LAB — POCT GLUCOSE (DEVICE FOR HOME USE): Glucose Fasting, POC: 81 mg/dL (ref 70–99)

## 2017-11-02 NOTE — Telephone Encounter (Signed)
Received Home Health Certification and Plan of Care; forwarded to provider/SLS 07/19

## 2017-11-02 NOTE — Patient Instructions (Addendum)
Your blood pressure is okay, continue taking carvedilol.  Check the  blood pressure 2 or 3 times a   Week  Be sure your blood pressure is between 110/65 and  140/85. If it is consistently higher or lower, let me know  Please drink some apple juice before you go home  Drink plenty of fluids  If you continue with mild nausea or dizziness let us know  If you are planning not to eat, please don't take your blood diabetes medicine.

## 2017-11-02 NOTE — Telephone Encounter (Signed)
Plan of care signed and faxed to [facility] at [phone]. Form sent for scanning.  

## 2017-11-02 NOTE — Progress Notes (Signed)
Subjective:    Patient ID: Patricia Moore, female    DOB: Mar 13, 1934, 84 y.o.   MRN: [id number]  DOS:  11/02/2017 Type of visit - description : Acute visit Interval history: Patient is concerned about her blood pressure, just like to be sure it is okay. BP was elevated few days ago. Good compliance with carvedilol. Also, today she took her diabetes medications but has not eating, feeling slightly nauseous.  BP Readings from Last 3 Encounters:  11/02/17 140/80  10/26/17 (!) 156/70  10/16/17 (!) 138/100     Review of Systems  Admits to mild dizziness here lately but denies headache.  Facial pain improved.  Past Medical History:  Diagnosis Date  . Allergy   . Anxiety 12/09/2013  . Arthritis   . Back pain   . Diabetes mellitus   . Diverticulitis    patient states has had three attacks   . Diverticulosis   . DJD (degenerative joint disease) 12/14/2014  . H. pylori infection 2001  . Hx of adenomatous colonic polyps   . Hyperlipidemia   . Hypertension   . Intraductal papilloma    bx nef x 2 in the 70s-90s  . Osteoporosis   . Saccular aneurysm   . SCC (squamous cell carcinoma) 03/2011   sees dermatology    Past Surgical History:  Procedure Laterality Date  . BREAST BIOPSY  '91, '95   INTRADUCTAL PAPILLOMA  . BREAST LUMPECTOMY    . CATARACT EXTRACTION Bilateral 09/2014  . CLOSED REDUCTION ULNAR SHAFT Right 10/06/2017   Procedure: CLOSED REDUCTION DISLOCATED RIGHT SHOULDER;  Surgeon: Kathryne Hitch, MD;  Location[facility];  Service: Orthopedics;  Laterality: Right;    Social History   Socioeconomic History  . Marital status: Married    Spouse name: Not on file  . Number of children: 1  . Years of education: Not on file  . Highest education level: Not on file  Occupational History  . Occupation: retired     Associate Professor: [employer]  Social Needs  . Financial resource strain: Not on file  . Food insecurity:    Worry: Not on file    Inability: Not on file  .  Transportation needs:    Medical: Not on file    Non-medical: Not on file  Tobacco Use  . Smoking status: Never Smoker  . Smokeless tobacco: Never Used  Substance and Sexual Activity  . Alcohol use: Never    Frequency: Never  . Drug use: No  . Sexual activity: Not Currently  Lifestyle  . Physical activity:    Days per week: Not on file    Minutes per session: Not on file  . Stress: Not on file  Relationships  . Social connections:    Talks on phone: Not on file    Gets together: Not on file    Attends religious service: Not on file    Active member of club or organization: Not on file    Attends meetings of clubs or organizations: Not on file    Relationship status: Not on file  . Intimate partner violence:    Fear of current or ex partner: Not on file    Emotionally abused: Not on file    Physically abused: Not on file    Forced sexual activity: Not on file  Other Topics Concern  . Not on file  Social History Narrative   Born in [place], ,moved to the [place] in the 60s, live in [place] then in  [place]   Lives w/ husband   H.S. Graduate   Son lives in [place])       Allergies as of 11/02/2017   No Known Allergies     Medication List        Accurate as of 11/02/17 11:59 PM. Always use your most recent med list.          acetaminophen 500 MG tablet Commonly known as:  TYLENOL Take 2 tablets (1,000 mg total) by mouth every 6 (six) hours as needed.   albuterol 108 (90 Base) MCG/ACT inhaler Commonly known as:  PROVENTIL HFA;VENTOLIN HFA Inhale 2 puffs into the lungs every 6 (six) hours as needed for wheezing or shortness of breath.   ALPRAZolam 0.25 MG tablet Commonly known as:  XANAX TAKE 1 TABLET BY MOUTH DAILY AS NEEDED   aspirin 81 MG tablet Take 81 mg by mouth daily.   bacitracin ointment Apply topically 2 (two) times daily.   baclofen 10 MG tablet Commonly known as:  LIORESAL Take 1 tablet (10 mg total) by mouth as needed for muscle spasms.   carvedilol 6.25  MG tablet Commonly known as:  COREG Take 1 tablet (6.25 mg total) by mouth 2 (two) times daily with a meal.   cholecalciferol 1000 units tablet Commonly known as:  VITAMIN D Take 1,000 Units by mouth daily.   co-enzyme Q-10 30 MG capsule Take 30 mg by mouth daily.   diazepam 5 MG tablet Commonly known as:  VALIUM Take 5 mg by mouth 4 (four) times daily as needed (dizziness).   docusate sodium 100 MG capsule Commonly known as:  COLACE Take 1 capsule (100 mg total) by mouth 2 (two) times daily.   dorzolamide-timolol 22.3-6.8 MG/ML ophthalmic solution Commonly known as:  COSOPT Place 1 drop into both eyes 2 (two) times daily.   erythromycin ophthalmic ointment Place into the right eye every 2 (two) hours.   folic acid 1 MG tablet Commonly known as:  FOLVITE Take 1 tablet (1 mg total) by mouth daily.   glucose blood test strip Commonly known as:  ONE TOUCH ULTRA TEST CHECK BLOOD SUGAR NO MORE THAN 2 TIMES DAILY   glyBURIDE 2.5 MG tablet Commonly known as:  DIABETA Take 1 tablet (2.5 mg total) by mouth 2 (two) times daily with a meal.   ibuprofen 400 MG tablet Commonly known as:  ADVIL,MOTRIN Take 1 tablet (400 mg total) by mouth every 8 (eight) hours.   meclizine 25 MG tablet Commonly known as:  ANTIVERT Take 1 tablet (25 mg total) by mouth 3 (three) times daily as needed for dizziness or nausea.   metFORMIN 1000 MG tablet Commonly known as:  GLUCOPHAGE TAKE 1 TABLET BY MOUTH WITH BREAKFAST, 1 TABLET WITH LUNCH AND 1/2 TABLET WITH DINNER DAILY   multivitamins with iron Tabs tablet Take 1 tablet by mouth daily.   onetouch ultrasoft lancets CHECK BLOOD SUGAR NO MORE THAN TWICE DAILY   polyethylene glycol packet Commonly known as:  MIRALAX / GLYCOLAX Take 17 g by mouth daily.   traMADol 50 MG tablet Commonly known as:  ULTRAM Take 1 tablet (50 mg total) by mouth every 6 (six) hours as needed for severe pain.          Objective:   Physical Exam BP 140/80  (BP Location: Left Arm, Patient Position: Sitting, Cuff Size: Normal)   Pulse 88   Temp 98.2 F (36.8 C) (Oral)   Resp 16   Ht 5\' 1"  (1.549  m)   Wt 141 lb 6.4 oz (64.1 kg)   LMP 04/17/1988   SpO2 98%   BMI 26.72 kg/m  General:   Well developed, NAD, see BMI.  HEENT:  Face much less swollen compared to last visit. Lungs:  CTA B Normal respiratory effort, no intercostal retractions, no accessory muscle use. Heart: RRR,  no murmur.  Base edema bilaterally  Skin: Not pale. Not jaundice Neurologic:  alert & oriented X3.  Speech normal, gait assisted by a walker.  Seems a steady Psych--  Cognition and judgment appear intact.  Cooperative with normal attention span and concentration.  Behavior appropriate. No anxious or depressed appearing.      Assessment & Plan:    Assessment  DM HTN -Losartan d/c d/t hyperkalemia -Amlodipine d/c d/t edema 07-2017 - HCTZ d/c d/t hyperkalemia 08/2017 Hyperlipidemia -- intolerant to lipitor and crestor (severe cramps) as off 01-2016  Anxiety- xanax rx by pcp Vertigo - valium prn GI: --Recurrent diverticulitis (one episode documented by a CT 11-2014) --colonoscopy 10-2013, Dr. Marina Goodell, + polyps, melanosis coli, tics --H. pylori 2001 DJD--back pain, knees, neck pain ( on baclofen for neck) Osteoporosis  SCC, skin cancer Elevated homocysteine Raynaud's phenomena  NEURO --Carotid US 10-2013: No significant plaque, although ICA's are serpentine, bilaterally. 40-59% bilateral ICA stenosis by velocity criteria likely due to tortuosity. Patent vertebral arteries with antegrade flow.Normal subclavian arteries, bilaterally. -- Transinet dizziness, diplopia sx late 2016 , saw neuro >>> Dx w/ TIA/Stroke on clinical grounds  W/u:  Brain  MRI 03-2015 >> 2 mm saccular aneurysm at the right posterior communicating artery origin, saw  Neurology, Rx to recheck MRI 1 year, ok continue ASA per neuro CTA head, neck 05-14-2015: aneurysm confirmed, otherwise  (-) --brain aneurysm see above, saw neuro 2018, consider repeat imagine 2020 --Admitted 07-2015, dizziness, MRI negative, DX labyrinthitis   PLAN: HTN: Patient concerned about her BP, has been elevated the last 2 times it was checked, BP today is 140/80, for now, recommend to continue with carvedilol 6.25 twice a day.  Check ambulatory BPs DM: She took glipizide and metformin this morning but has not eaten, feeling slightly nauseous and dizzy. Rec  to avoid taking medications if she plans to fast. CBG today 81, provided some apple juice, see instructions. RTC in approximately 3 months as previously recommended

## 2017-11-03 NOTE — Assessment & Plan Note (Signed)
HTN: Patient concerned about her BP, has been elevated the last 2 times it was checked, BP today is 140/80, for now, recommend to continue with carvedilol 6.25 twice a day.  Check ambulatory BPs DM: She took glipizide and metformin this morning but has not eaten, feeling slightly nauseous and dizzy. Rec  to avoid taking medications if she plans to fast. CBG today 81, provided some apple juice, see instructions. RTC in approximately 3 months as previously recommended

## 2017-11-07 ENCOUNTER — Other Ambulatory Visit: Payer: Self-pay | Admitting: Internal Medicine

## 2017-11-07 ENCOUNTER — Other Ambulatory Visit: Payer: Medicare Other

## 2017-11-07 ENCOUNTER — Ambulatory Visit: Payer: Medicare Other | Admitting: Family

## 2017-11-14 ENCOUNTER — Ambulatory Visit: Payer: Medicare Other | Admitting: Internal Medicine

## 2017-11-20 DIAGNOSIS — S0280XD Fracture of other specified skull and facial bones, unspecified side, subsequent encounter for fracture with routine healing: Secondary | ICD-10-CM | POA: Diagnosis not present

## 2017-11-26 ENCOUNTER — Encounter: Payer: Self-pay | Admitting: Internal Medicine

## 2017-11-26 DIAGNOSIS — H43812 Vitreous degeneration, left eye: Secondary | ICD-10-CM | POA: Diagnosis not present

## 2017-11-26 DIAGNOSIS — H26493 Other secondary cataract, bilateral: Secondary | ICD-10-CM | POA: Diagnosis not present

## 2017-11-26 DIAGNOSIS — H401131 Primary open-angle glaucoma, bilateral, mild stage: Secondary | ICD-10-CM | POA: Diagnosis not present

## 2017-11-26 DIAGNOSIS — E119 Type 2 diabetes mellitus without complications: Secondary | ICD-10-CM | POA: Diagnosis not present

## 2017-11-26 DIAGNOSIS — H532 Diplopia: Secondary | ICD-10-CM | POA: Diagnosis not present

## 2017-11-29 ENCOUNTER — Ambulatory Visit (INDEPENDENT_AMBULATORY_CARE_PROVIDER_SITE_OTHER): Payer: Medicare Other | Admitting: Orthopaedic Surgery

## 2017-11-29 ENCOUNTER — Encounter (INDEPENDENT_AMBULATORY_CARE_PROVIDER_SITE_OTHER): Payer: Self-pay | Admitting: Orthopaedic Surgery

## 2017-11-29 ENCOUNTER — Ambulatory Visit (INDEPENDENT_AMBULATORY_CARE_PROVIDER_SITE_OTHER): Payer: Medicare Other

## 2017-11-29 DIAGNOSIS — S43014D Anterior dislocation of right humerus, subsequent encounter: Secondary | ICD-10-CM | POA: Diagnosis not present

## 2017-11-29 NOTE — Progress Notes (Signed)
The patient is now about 7 weeks status post an acute fall which she sustained a right anterior shoulder dislocation.  She is only 84 years old.  She has never dislocated the shoulder before.  She is having pain in the shoulder itself but feels like she is doing well and making good progress.  On exam she does show some deficit in the rotator cuff of the shoulder is clinically well located when examining the right shoulder.  I can move her shoulder around smoothly itself with only mild discomfort.  3 views of the shoulder obtained of the right shoulder issues as well located with no evidence of fracture.  The humeral head is in the central position within the glenoid.  At this point she will continue increase her activities as comfort allows.  I offered physical therapy and an injection but she is deferred these feelings like she is going to get better on her own.  I can at least see her back in 6 weeks for repeat exam to determine whether or not we would proceed with physical therapy and injection only if needed all question concerns were answered and addressed.  No x-rays are needed at her next visit.

## 2017-11-30 ENCOUNTER — Other Ambulatory Visit: Payer: Self-pay | Admitting: Family

## 2017-11-30 DIAGNOSIS — R778 Other specified abnormalities of plasma proteins: Secondary | ICD-10-CM

## 2017-12-03 ENCOUNTER — Inpatient Hospital Stay (HOSPITAL_BASED_OUTPATIENT_CLINIC_OR_DEPARTMENT_OTHER): Payer: Medicare Other | Admitting: Family

## 2017-12-03 ENCOUNTER — Other Ambulatory Visit: Payer: Self-pay

## 2017-12-03 ENCOUNTER — Encounter: Payer: Self-pay | Admitting: Family

## 2017-12-03 ENCOUNTER — Inpatient Hospital Stay: Payer: Medicare Other | Attending: Hematology & Oncology

## 2017-12-03 DIAGNOSIS — Z800 Family history of malignant neoplasm of digestive organs: Secondary | ICD-10-CM

## 2017-12-03 DIAGNOSIS — I10 Essential (primary) hypertension: Secondary | ICD-10-CM | POA: Diagnosis not present

## 2017-12-03 DIAGNOSIS — M129 Arthropathy, unspecified: Secondary | ICD-10-CM

## 2017-12-03 DIAGNOSIS — Z8719 Personal history of other diseases of the digestive system: Secondary | ICD-10-CM | POA: Insufficient documentation

## 2017-12-03 DIAGNOSIS — R799 Abnormal finding of blood chemistry, unspecified: Secondary | ICD-10-CM

## 2017-12-03 DIAGNOSIS — R002 Palpitations: Secondary | ICD-10-CM | POA: Insufficient documentation

## 2017-12-03 DIAGNOSIS — M1990 Unspecified osteoarthritis, unspecified site: Secondary | ICD-10-CM | POA: Diagnosis not present

## 2017-12-03 DIAGNOSIS — M549 Dorsalgia, unspecified: Secondary | ICD-10-CM

## 2017-12-03 DIAGNOSIS — F419 Anxiety disorder, unspecified: Secondary | ICD-10-CM

## 2017-12-03 DIAGNOSIS — Z85828 Personal history of other malignant neoplasm of skin: Secondary | ICD-10-CM | POA: Insufficient documentation

## 2017-12-03 DIAGNOSIS — Z7984 Long term (current) use of oral hypoglycemic drugs: Secondary | ICD-10-CM | POA: Diagnosis not present

## 2017-12-03 DIAGNOSIS — Z79899 Other long term (current) drug therapy: Secondary | ICD-10-CM | POA: Diagnosis not present

## 2017-12-03 DIAGNOSIS — R778 Other specified abnormalities of plasma proteins: Secondary | ICD-10-CM

## 2017-12-03 DIAGNOSIS — H532 Diplopia: Secondary | ICD-10-CM | POA: Insufficient documentation

## 2017-12-03 DIAGNOSIS — E785 Hyperlipidemia, unspecified: Secondary | ICD-10-CM | POA: Insufficient documentation

## 2017-12-03 DIAGNOSIS — E119 Type 2 diabetes mellitus without complications: Secondary | ICD-10-CM | POA: Insufficient documentation

## 2017-12-03 DIAGNOSIS — Z803 Family history of malignant neoplasm of breast: Secondary | ICD-10-CM | POA: Diagnosis not present

## 2017-12-03 DIAGNOSIS — M810 Age-related osteoporosis without current pathological fracture: Secondary | ICD-10-CM | POA: Insufficient documentation

## 2017-12-03 LAB — CBC WITH DIFFERENTIAL (CANCER CENTER ONLY)
Basophils Absolute: 0.1 10*3/uL (ref 0.0–0.1)
Basophils Relative: 0 %
Eosinophils Absolute: 2.7 10*3/uL — ABNORMAL HIGH (ref 0.0–0.5)
Eosinophils Relative: 19 %
HCT: 39.1 % (ref 34.8–46.6)
Hemoglobin: 12.5 g/dL (ref 11.6–15.9)
Lymphocytes Relative: 22 %
Lymphs Abs: 3.2 10*3/uL (ref 0.9–3.3)
MCH: 30.7 pg (ref 26.0–34.0)
MCHC: 32 g/dL (ref 32.0–36.0)
MCV: 96.1 fL (ref 81.0–101.0)
Monocytes Absolute: 1.2 10*3/uL — ABNORMAL HIGH (ref 0.1–0.9)
Monocytes Relative: 8 %
Neutro Abs: 7.3 10*3/uL — ABNORMAL HIGH (ref 1.5–6.5)
Neutrophils Relative %: 51 %
Platelet Count: 320 10*3/uL (ref 145–400)
RBC: 4.07 MIL/uL (ref 3.70–5.32)
RDW: 12.5 % (ref 11.1–15.7)
WBC Count: 14.5 10*3/uL — ABNORMAL HIGH (ref 3.9–10.0)

## 2017-12-03 LAB — CMP (CANCER CENTER ONLY)
ALT: 20 U/L (ref 0–44)
AST: 25 U/L (ref 15–41)
Albumin: 4.2 g/dL (ref 3.5–5.0)
Alkaline Phosphatase: 84 U/L (ref 38–126)
Anion gap: 9 (ref 5–15)
BUN: 23 mg/dL (ref 8–23)
CO2: 32 mmol/L (ref 22–32)
Calcium: 10.6 mg/dL — ABNORMAL HIGH (ref 8.9–10.3)
Chloride: 98 mmol/L (ref 98–111)
Creatinine: 1.04 mg/dL — ABNORMAL HIGH (ref 0.44–1.00)
GFR, Est AFR Am: 57 mL/min — ABNORMAL LOW (ref >60)
GFR, Estimated: 49 mL/min — ABNORMAL LOW (ref >60)
Glucose, Bld: 88 mg/dL (ref 70–99)
Potassium: 4.3 mmol/L (ref 3.5–5.1)
Sodium: 139 mmol/L (ref 135–145)
Total Bilirubin: 0.2 mg/dL — ABNORMAL LOW (ref 0.3–1.2)
Total Protein: 8.2 g/dL — ABNORMAL HIGH (ref 6.5–8.1)

## 2017-12-03 LAB — SAVE SMEAR

## 2017-12-03 LAB — LACTATE DEHYDROGENASE: LDH: 156 U/L (ref 98–192)
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
## 2017-12-04 LAB — PROTEIN ELECTROPHORESIS, SERUM
A/G Ratio: 1.1 (ref 0.7–1.7)
Albumin ELP: 3.9 g/dL (ref 2.9–4.4)
Alpha-1-Globulin: 0.1 g/dL (ref 0.0–0.4)
Alpha-2-Globulin: 0.8 g/dL (ref 0.4–1.0)
Beta Globulin: 1.3 g/dL (ref 0.7–1.3)
Gamma Globulin: 1.5 g/dL (ref 0.4–1.8)
Globulin, Total: 3.7 g/dL (ref 2.2–3.9)
Total Protein ELP: 7.6 g/dL (ref 6.0–8.5)

## 2017-12-04 LAB — IGG, IGA, IGM
IgA: 368 mg/dL (ref 64–422)
IgG (Immunoglobin G), Serum: 1437 mg/dL (ref 700–1600)
IgM (Immunoglobulin M), Srm: 59 mg/dL (ref 26–217)

## 2017-12-04 LAB — KAPPA/LAMBDA LIGHT CHAINS
Kappa free light chain: 36.7 mg/L — ABNORMAL HIGH (ref 3.3–19.4)
Kappa, lambda light chain ratio: 1.68 — ABNORMAL HIGH (ref 0.26–1.65)
Lambda free light chains: 21.8 mg/L (ref 5.7–26.3)

## 2017-12-05 ENCOUNTER — Ambulatory Visit (INDEPENDENT_AMBULATORY_CARE_PROVIDER_SITE_OTHER): Payer: Medicare Other | Admitting: Internal Medicine

## 2017-12-05 ENCOUNTER — Encounter: Payer: Self-pay | Admitting: Internal Medicine

## 2017-12-05 VITALS — BP 126/68 | HR 69 | Temp 98.0°F | Resp 16 | Ht 61.0 in | Wt 145.1 lb

## 2017-12-05 DIAGNOSIS — R21 Rash and other nonspecific skin eruption: Secondary | ICD-10-CM

## 2017-12-05 MED ORDER — KETOCONAZOLE 2 % EX CREA: 1.0000 "application " | TOPICAL_CREAM | Freq: Every day | CUTANEOUS | 0 refills | Status: DC

## 2017-12-05 NOTE — Patient Instructions (Signed)
Apply the cream twice a day for 10 days  Keep the area clean and dry, use powder or talc as needed  Call if not gradually better
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
## 2017-12-05 NOTE — Progress Notes (Signed)
Pre visit review using our clinic review tool, if applicable. No additional management support is needed unless otherwise documented below in the visit note. 

## 2017-12-06 NOTE — Assessment & Plan Note (Signed)
Rash Suspect a fungal infection, recommend to keep the area clean and dry with powder, prescribed ketoconazole, call if not better

## 2017-12-07 ENCOUNTER — Other Ambulatory Visit: Payer: Self-pay | Admitting: Internal Medicine

## 2017-12-10 ENCOUNTER — Other Ambulatory Visit: Payer: Self-pay | Admitting: Internal Medicine

## 2017-12-10 NOTE — Telephone Encounter (Signed)
She got 90 tablets less than a month ago, not appropriate to refill, let her know

## 2017-12-19 ENCOUNTER — Other Ambulatory Visit: Payer: Self-pay

## 2017-12-19 MED ORDER — BACLOFEN 10 MG PO TABS: 10.0000 mg | ORAL_TABLET | Freq: Every day | ORAL | 0 refills | Status: DC | PRN

## 2017-12-27 NOTE — Telephone Encounter (Signed)
Baclofen refilled 9/4 by Vilma Prader, CMA.

## 2018-01-07 DIAGNOSIS — H401131 Primary open-angle glaucoma, bilateral, mild stage: Secondary | ICD-10-CM | POA: Diagnosis not present

## 2018-01-07 DIAGNOSIS — H532 Diplopia: Secondary | ICD-10-CM | POA: Diagnosis not present

## 2018-01-07 DIAGNOSIS — S0231XA Fracture of orbital floor, right side, initial encounter for closed fracture: Secondary | ICD-10-CM | POA: Diagnosis not present

## 2018-01-07 LAB — HM DIABETES EYE EXAM

## 2018-01-10 ENCOUNTER — Ambulatory Visit (INDEPENDENT_AMBULATORY_CARE_PROVIDER_SITE_OTHER): Payer: Medicare Other | Admitting: Orthopaedic Surgery

## 2018-01-15 ENCOUNTER — Other Ambulatory Visit: Payer: Self-pay | Admitting: Internal Medicine

## 2018-01-16 ENCOUNTER — Encounter: Payer: Self-pay | Admitting: Internal Medicine

## 2018-01-16 ENCOUNTER — Ambulatory Visit (INDEPENDENT_AMBULATORY_CARE_PROVIDER_SITE_OTHER): Payer: Medicare Other | Admitting: Internal Medicine

## 2018-01-16 VITALS — BP 152/80 | HR 68 | Temp 97.6°F | Resp 16 | Ht 61.0 in | Wt 145.0 lb

## 2018-01-16 DIAGNOSIS — I10 Essential (primary) hypertension: Secondary | ICD-10-CM

## 2018-01-16 DIAGNOSIS — E785 Hyperlipidemia, unspecified: Secondary | ICD-10-CM | POA: Diagnosis not present

## 2018-01-16 DIAGNOSIS — E119 Type 2 diabetes mellitus without complications: Secondary | ICD-10-CM

## 2018-01-16 LAB — HEMOGLOBIN A1C: Hgb A1c MFr Bld: 7.3 % — ABNORMAL HIGH (ref 4.6–6.5)

## 2018-01-16 MED ORDER — GLUCOSE BLOOD VI STRP: ORAL_STRIP | 12 refills | Status: DC

## 2018-01-16 NOTE — Patient Instructions (Addendum)
GO TO THE LAB : Get the blood work     GO TO THE FRONT DESK Schedule your next appointment for a   Physical exam in 4 months d

## 2018-01-16 NOTE — Progress Notes (Signed)
Pre visit review using our clinic review tool, if applicable. No additional management support is needed unless otherwise documented below in the visit note. 
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
## 2018-01-17 NOTE — Assessment & Plan Note (Signed)
DM: Continue glipizide and metformin, check a A1c, refill strips. HTN: On carvedilol, BP today 152/80, normal ambulatory BPs.  No change for now Hyperlipidemia: Has been intolerant to Lipitor and Crestor, we talk about possibly trying a third medicine and she declined . Pain control: Currently taking ibuprofen sporadically and very rarely tramadol. Osteoporosis: Last T score -1.6 (11-2015).  Used to be follow-up by gynecology, consider repeat a bone density test at some point. Edema: Reports occasional edema on the lower extremities with standing for prolonged periods of time.  No edema on exam today.  Recommend compression stockings RTC 4 months CPX

## 2018-01-21 ENCOUNTER — Ambulatory Visit (INDEPENDENT_AMBULATORY_CARE_PROVIDER_SITE_OTHER): Payer: Medicare Other | Admitting: Orthopaedic Surgery

## 2018-01-21 ENCOUNTER — Encounter (INDEPENDENT_AMBULATORY_CARE_PROVIDER_SITE_OTHER): Payer: Self-pay | Admitting: Orthopaedic Surgery

## 2018-01-21 DIAGNOSIS — S43014D Anterior dislocation of right humerus, subsequent encounter: Secondary | ICD-10-CM | POA: Diagnosis not present

## 2018-01-21 NOTE — Progress Notes (Signed)
The patient is a 84 year old who is now getting close to 4 months status post a traumatic shoulder dislocation.  We performed a closed reduction.  She is doing much better overall but still has weakness in that shoulder.  She is adverse to any type of steroid injection having had her blood glucose go up with steroid injections in the past.  She does report that she is doing better overall.  Her husband is with her.  He feels that she would benefit from physical therapy.  On examination her right shoulder is well located and seems to move better overall but she does have deficiency in the rotator cuff.  I am recommending outpatient physical therapy.  I do feel this would help mobilize her shoulder better.  She is agreeable to this.  I gave her prescription for physical therapy and they will contact them.  Follow-up from my standpoint at this point can be as needed.  All question concerns were answered and addressed.

## 2018-01-22 DIAGNOSIS — S0285XD Fracture of orbit, unspecified, subsequent encounter for fracture with routine healing: Secondary | ICD-10-CM | POA: Diagnosis not present

## 2018-02-01 ENCOUNTER — Other Ambulatory Visit: Payer: Self-pay

## 2018-02-01 DIAGNOSIS — S43084D Other dislocation of right shoulder joint, subsequent encounter: Secondary | ICD-10-CM | POA: Diagnosis not present

## 2018-02-01 NOTE — Patient Outreach (Signed)
[facility]) Care Management  02/01/2018  Patricia Moore [birth year] [id number]   Medication Adherence call to [name] left a message for patient to call back patient is due on Metformin 1000 mg.[name] is showing past due under [place].   [place] Management Direct Dial [phone]  Fax [phone] [email or web address]

## 2018-02-05 DIAGNOSIS — S43084D Other dislocation of right shoulder joint, subsequent encounter: Secondary | ICD-10-CM | POA: Diagnosis not present

## 2018-02-08 DIAGNOSIS — S43084D Other dislocation of right shoulder joint, subsequent encounter: Secondary | ICD-10-CM | POA: Diagnosis not present

## 2018-02-25 ENCOUNTER — Ambulatory Visit: Payer: Medicare Other | Admitting: Neurology

## 2018-02-25 ENCOUNTER — Encounter: Payer: Self-pay | Admitting: Neurology

## 2018-02-25 VITALS — BP 114/66 | HR 68 | Ht 61.0 in | Wt 143.0 lb

## 2018-02-25 DIAGNOSIS — R519 Headache, unspecified: Secondary | ICD-10-CM

## 2018-02-25 DIAGNOSIS — R51 Headache: Secondary | ICD-10-CM | POA: Diagnosis not present

## 2018-02-25 MED ORDER — GABAPENTIN 100 MG PO CAPS: 100.0000 mg | ORAL_CAPSULE | Freq: Every day | ORAL | 3 refills | Status: DC
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
## 2018-02-25 NOTE — Patient Instructions (Signed)
To help with facial pain, start gabapentin 100mg  at bedtime.  Contact me in one month if pain not controlled, and I can increase dose. Follow up in 4 months.

## 2018-03-11 ENCOUNTER — Encounter (INDEPENDENT_AMBULATORY_CARE_PROVIDER_SITE_OTHER): Payer: Self-pay | Admitting: Orthopaedic Surgery

## 2018-03-11 ENCOUNTER — Other Ambulatory Visit (INDEPENDENT_AMBULATORY_CARE_PROVIDER_SITE_OTHER): Payer: Self-pay

## 2018-03-11 ENCOUNTER — Ambulatory Visit (INDEPENDENT_AMBULATORY_CARE_PROVIDER_SITE_OTHER): Payer: Medicare Other | Admitting: Orthopaedic Surgery

## 2018-03-11 DIAGNOSIS — G8929 Other chronic pain: Secondary | ICD-10-CM

## 2018-03-11 DIAGNOSIS — S43014D Anterior dislocation of right humerus, subsequent encounter: Secondary | ICD-10-CM

## 2018-03-11 DIAGNOSIS — M25511 Pain in right shoulder: Principal | ICD-10-CM

## 2018-03-11 NOTE — Progress Notes (Signed)
The patient is continuing to have problems with her right shoulder after mechanical fall with an anterior dislocation that occurred 5 months ago.  She is 84 years old and very active.  This is her dominant side.  She is having significant weakness with overhead activities and this is detrimentally affecting her mobility and her activities daily living and quality of life.  She cannot have a steroid injection due to the detrimental impact this has on her blood glucose.  On exam she still shows significant weakness in the rotator cuff with forward elevation and abduction of her right shoulder.  At this point I would like to send her to physical therapy as an outpatient with Orlene Och outpatient physical therapy to try to improve her right shoulder function.  She agrees with trying this as well.  We will see her back about 2 months to see how she is doing overall.

## 2018-03-18 ENCOUNTER — Ambulatory Visit (INDEPENDENT_AMBULATORY_CARE_PROVIDER_SITE_OTHER): Payer: Medicare Other | Admitting: Orthopaedic Surgery

## 2018-03-25 ENCOUNTER — Ambulatory Visit: Payer: Medicare Other | Attending: Orthopaedic Surgery | Admitting: Physical Therapy

## 2018-03-25 ENCOUNTER — Encounter: Payer: Self-pay | Admitting: Physical Therapy

## 2018-03-25 ENCOUNTER — Other Ambulatory Visit: Payer: Self-pay

## 2018-03-25 DIAGNOSIS — M25611 Stiffness of right shoulder, not elsewhere classified: Secondary | ICD-10-CM | POA: Diagnosis not present

## 2018-03-25 DIAGNOSIS — M25511 Pain in right shoulder: Secondary | ICD-10-CM | POA: Insufficient documentation

## 2018-03-25 DIAGNOSIS — M6281 Muscle weakness (generalized): Secondary | ICD-10-CM | POA: Insufficient documentation

## 2018-03-25 DIAGNOSIS — G8929 Other chronic pain: Secondary | ICD-10-CM | POA: Insufficient documentation

## 2018-03-26 ENCOUNTER — Encounter: Payer: Self-pay | Admitting: Physical Therapy
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
## 2018-04-01 ENCOUNTER — Ambulatory Visit: Payer: Medicare Other | Admitting: Physical Therapy

## 2018-04-01 ENCOUNTER — Encounter: Payer: Self-pay | Admitting: Physical Therapy

## 2018-04-01 DIAGNOSIS — G8929 Other chronic pain: Secondary | ICD-10-CM

## 2018-04-01 DIAGNOSIS — M25511 Pain in right shoulder: Principal | ICD-10-CM

## 2018-04-01 DIAGNOSIS — M25611 Stiffness of right shoulder, not elsewhere classified: Secondary | ICD-10-CM | POA: Diagnosis not present

## 2018-04-01 DIAGNOSIS — M6281 Muscle weakness (generalized): Secondary | ICD-10-CM
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
## 2018-04-01 NOTE — Patient Instructions (Signed)
(  Home) Retraction: Row - Bilateral Engineer, maintenance (IT))    De frente al punto de enganche, brazos extendidos [employer], lleve las manos hacia el [place], apretando los omplatos uno [place]. Repita _10___ veces por rutina. Realice __6-0 __ rutinas por sesin.    MAY STAND    Copyright  VHI. All rights reserved.

## 2018-04-02 ENCOUNTER — Ambulatory Visit (INDEPENDENT_AMBULATORY_CARE_PROVIDER_SITE_OTHER): Payer: Medicare Other | Admitting: Internal Medicine

## 2018-04-02 ENCOUNTER — Encounter: Payer: Self-pay | Admitting: Internal Medicine

## 2018-04-02 VITALS — BP 126/62 | HR 57 | Temp 97.8°F | Resp 16 | Ht 61.0 in | Wt 142.5 lb

## 2018-04-02 DIAGNOSIS — R21 Rash and other nonspecific skin eruption: Secondary | ICD-10-CM | POA: Diagnosis not present

## 2018-04-02 DIAGNOSIS — S0285XD Fracture of orbit, unspecified, subsequent encounter for fracture with routine healing: Secondary | ICD-10-CM | POA: Diagnosis not present

## 2018-04-02 DIAGNOSIS — M545 Low back pain, unspecified: Secondary | ICD-10-CM

## 2018-04-02 DIAGNOSIS — Z79899 Other long term (current) drug therapy: Secondary | ICD-10-CM | POA: Diagnosis not present

## 2018-04-02 DIAGNOSIS — F419 Anxiety disorder, unspecified: Secondary | ICD-10-CM | POA: Diagnosis not present

## 2018-04-02 MED ORDER — GLYBURIDE 2.5 MG PO TABS: 2.5000 mg | ORAL_TABLET | Freq: Two times a day (BID) | ORAL | 5 refills | Status: DC

## 2018-04-02 MED ORDER — GLUCOSE BLOOD VI STRP: ORAL_STRIP | 12 refills | Status: DC

## 2018-04-02 MED ORDER — ALPRAZOLAM 0.25 MG PO TABS: 0.2500 mg | ORAL_TABLET | Freq: Every day | ORAL | 1 refills | Status: DC | PRN

## 2018-04-02 MED ORDER — HYDROCORTISONE 2.5 % EX CREA: TOPICAL_CREAM | Freq: Two times a day (BID) | CUTANEOUS | 0 refills | Status: AC

## 2018-04-02 NOTE — Patient Instructions (Signed)
Please go to the lab and provide a urine sample   Apply the cream twice a day to the back until better   Call if the pain resurface.

## 2018-04-02 NOTE — Progress Notes (Signed)
Subjective:    Patient ID: Patricia Moore, female    DOB: March 16, 1934, 84 y.o.   MRN: [id number]  DOS:  04/02/2018 Type of visit - description : Acute visit A week ago, developed pain in the right side of the thoracic spine immediately after she got out of bed. The area was a slightly TTP.  She also noted that the pain increased with certain movements.  Pain did not radiate. She put a over-the-counter pain patch, it irritated the skin and created some "pimples". This week, the pain has decreased significantly.  I am prescribing Xanax for anxiety, she takes it rarely but likes to have a prescription on hand.  Review of Systems No fever chills. No injury No dysuria or gross hematuria.  Past Medical History:  Diagnosis Date  . Allergy   . Anxiety 12/09/2013  . Arthritis   . Back pain   . Diabetes mellitus   . Diverticulitis    patient states has had three attacks   . Diverticulosis   . DJD (degenerative joint disease) 12/14/2014  . H. pylori infection 2001  . Hx of adenomatous colonic polyps   . Hyperlipidemia   . Hypertension   . Intraductal papilloma    bx nef x 2 in the 70s-90s  . Osteoporosis   . Saccular aneurysm   . SCC (squamous cell carcinoma) 03/2011   sees dermatology    Past Surgical History:  Procedure Laterality Date  . BREAST BIOPSY  '91, '95   INTRADUCTAL PAPILLOMA  . BREAST LUMPECTOMY    . CATARACT EXTRACTION Bilateral 09/2014  . CLOSED REDUCTION ULNAR SHAFT Right 10/06/2017   Procedure: CLOSED REDUCTION DISLOCATED RIGHT SHOULDER;  Surgeon: Kathryne Hitch, MD;  [facility] OR;  Service: Orthopedics;  Laterality: Right;    Social History   Socioeconomic History  . Marital status: Married    Spouse name: Not on file  . Number of children: 1  . Years of education: Not on file  . Highest education level: Not on file  Occupational History  . Occupation: retired     Associate Professor: [employer]  Social Needs  . Financial resource strain: Not on file   . Food insecurity:    Worry: Not on file    Inability: Not on file  . Transportation needs:    Medical: Not on file    Non-medical: Not on file  Tobacco Use  . Smoking status: Never Smoker  . Smokeless tobacco: Never Used  Substance and Sexual Activity  . Alcohol use: Never    Frequency: Never  . Drug use: No  . Sexual activity: Not Currently  Lifestyle  . Physical activity:    Days per week: Not on file    Minutes per session: Not on file  . Stress: Not on file  Relationships  . Social connections:    Talks on phone: Not on file    Gets together: Not on file    Attends religious service: Not on file    Active member of club or organization: Not on file    Attends meetings of clubs or organizations: Not on file    Relationship status: Not on file  . Intimate partner violence:    Fear of current or ex partner: Not on file    Emotionally abused: Not on file    Physically abused: Not on file    Forced sexual activity: Not on file  Other Topics Concern  . Not on file  Social History Narrative  Born in [place], ,moved to the [place] in the 60s, live in [place] then in [place]   Lives w/ husband   H.S. Buyer, retail   Son lives in [place])       Allergies as of 04/02/2018   No Known Allergies     Medication List       Accurate as of April 02, 2018 11:59 PM. Always use your most recent med list.        albuterol 108 (90 Base) MCG/ACT inhaler Commonly known as:  PROVENTIL HFA;VENTOLIN HFA Inhale 2 puffs into the lungs every 6 (six) hours as needed for wheezing or shortness of breath.   ALPRAZolam 0.25 MG tablet Commonly known as:  XANAX Take 1 tablet (0.25 mg total) by mouth daily as needed.   aspirin 81 MG tablet Take 81 mg by mouth daily.   baclofen 10 MG tablet Commonly known as:  LIORESAL Take 1 tablet (10 mg total) by mouth daily as needed for muscle spasms.   carvedilol 6.25 MG tablet Commonly known as:  COREG Take 1 tablet (6.25 mg total) by mouth 2 (two) times  daily with a meal.   cholecalciferol 1000 units tablet Commonly known as:  VITAMIN D Take 1,000 Units by mouth daily.   co-enzyme Q-10 30 MG capsule Take 30 mg by mouth daily.   diazepam 5 MG tablet Commonly known as:  VALIUM Take 5 mg by mouth 4 (four) times daily as needed (dizziness).   docusate sodium 100 MG capsule Commonly known as:  COLACE Take 1 capsule (100 mg total) by mouth 2 (two) times daily.   dorzolamide-timolol 22.3-6.8 MG/ML ophthalmic solution Commonly known as:  COSOPT Place 1 drop into both eyes 2 (two) times daily.   folic acid 1 MG tablet Commonly known as:  FOLVITE Take 1 tablet (1 mg total) by mouth daily.   gabapentin 100 MG capsule Commonly known as:  NEURONTIN Take 1 capsule (100 mg total) by mouth at bedtime.   glucose blood test strip Commonly known as:  ONE TOUCH ULTRA TEST CHECK BLOOD SUGAR NO MORE THAN 2 TIMES DAILY   glyBURIDE 2.5 MG tablet Commonly known as:  DIABETA Take 1 tablet (2.5 mg total) by mouth 2 (two) times daily with a meal.   hydrocortisone 2.5 % cream Apply topically 2 (two) times daily.   ketoconazole 2 % cream Commonly known as:  NIZORAL Apply 1 application topically daily.   metFORMIN 1000 MG tablet Commonly known as:  GLUCOPHAGE TAKE 1 TABLET BY MOUTH WITH BREAKFAST, 1 TABLET WITH LUNCH AND 1/2 TABLET WITH DINNER DAILY   multivitamins with iron Tabs tablet Take 1 tablet by mouth daily.   onetouch ultrasoft lancets CHECK BLOOD SUGAR NO MORE THAN 2 TIMES DAILY   polyethylene glycol packet Commonly known as:  MIRALAX / GLYCOLAX Take 17 g by mouth daily.           Objective:   Physical Exam BP 126/62 (BP Location: Left Arm, Patient Position: Sitting, Cuff Size: Small)   Pulse (!) 57   Temp 97.8 F (36.6 C) (Oral)   Resp 16   Ht 5\' 1"  (1.549 m)   Wt 142 lb 8 oz (64.6 kg)   LMP 04/17/1988   SpO2 98%   BMI 26.93 kg/m  General:   Well developed, NAD, BMI noted.  HEENT:  Normocephalic . Face  symmetric, atraumatic MSK: No TTP at the thoracic spine. Abdomen:  Not distended, soft, non-tender. No rebound or rigidity.  Skin: At the area where she had pain, right from the thoracic spine, she has a nonanatomic rash.  See picture Neurologic:  alert & oriented X3.  Speech normal, gait appropriate for age and unassisted Psych--  Cognition and judgment appear intact.  Cooperative with normal attention span and concentration.  Behavior appropriate. No anxious or depressed appearing.       Assessment & Plan:    Assessment  DM HTN -Losartan d/c d/t hyperkalemia -Amlodipine d/c d/t edema 07-2017 - HCTZ d/c d/t hyperkalemia 08/2017 Hyperlipidemia -- intolerant to lipitor and crestor (severe cramps).  Declined further trial with medications  on 01/16/2018   Anxiety- xanax rx by pcp Vertigo - valium prn GI: --Recurrent diverticulitis (one episode documented by a CT 11-2014) --colonoscopy 10-2013, Dr. Marina Goodell, + polyps, melanosis coli, tics --H. pylori 2001 DJD--back pain, knees, neck pain ( on baclofen for neck) Osteoporosis  SCC, skin cancer Elevated homocysteine Raynaud's phenomena  NEURO --Carotid US 10-2013: No significant plaque, although ICA's are serpentine, bilaterally. 40-59% bilateral ICA stenosis by velocity criteria likely due to tortuosity. Patent vertebral arteries with antegrade flow.Normal subclavian arteries, bilaterally. -- Transinet dizziness, diplopia sx late 2016 , saw neuro >>> Dx w/ TIA/Stroke on clinical grounds  W/u:  Brain  MRI 03-2015 >> 2 mm saccular aneurysm at the right posterior communicating artery origin, saw  Neurology, Rx to recheck MRI 1 year, ok continue ASA per neuro CTA head, neck 05-14-2015: aneurysm confirmed, otherwise (-) --brain aneurysm see above, saw neuro 2018, consider repeat imagine 2020 --Admitted 07-2015, dizziness, MRI negative, DX labyrinthitis   PLAN: Back pain: Likely a MSK issue, essentially resolved.  We talked about possibly  doing an x-ray but she declined.  Will call next week if she continue with symptoms.  X-rays?. Rash: Likely reaction to the patch.  Recommend avoidance, Rx hydrocortisone 2.5%. Anxiety: On Xanax, takes it regularly, UDS and contract today.  Rx sent. Med list reviewed.  Not taking gabapentin or tramadol.

## 2018-04-02 NOTE — Progress Notes (Signed)
Pre visit review using our clinic review tool, if applicable. No additional management support is needed unless otherwise documented below in the visit note. 

## 2018-04-03 NOTE — Assessment & Plan Note (Signed)
Back pain: Likely a MSK issue, essentially resolved.  We talked about possibly doing an x-ray but she declined.  Will call next week if she continue with symptoms.  X-rays?. Rash: Likely reaction to the patch.  Recommend avoidance, Rx hydrocortisone 2.5%. Anxiety: On Xanax, takes it regularly, UDS and contract today.  Rx sent. Med list reviewed.  Not taking gabapentin or tramadol.

## 2018-04-04 LAB — PAIN MGMT, PROFILE 8 W/CONF, U
6 Acetylmorphine: NEGATIVE ng/mL (ref <10)
Alcohol Metabolites: NEGATIVE ng/mL (ref <500)
Alphahydroxyalprazolam: NEGATIVE ng/mL (ref <25)
Alphahydroxymidazolam: NEGATIVE ng/mL (ref <50)
Alphahydroxytriazolam: NEGATIVE ng/mL (ref <50)
Aminoclonazepam: NEGATIVE ng/mL (ref <25)
Amphetamines: NEGATIVE ng/mL (ref <500)
Benzodiazepines: POSITIVE ng/mL — AB (ref <100)
Buprenorphine, Urine: NEGATIVE ng/mL (ref <5)
Cocaine Metabolite: NEGATIVE ng/mL (ref <150)
Creatinine: 119.8 mg/dL
Hydroxyethylflurazepam: NEGATIVE ng/mL (ref <50)
Lorazepam: NEGATIVE ng/mL (ref <50)
MDMA: NEGATIVE ng/mL (ref <500)
Marijuana Metabolite: NEGATIVE ng/mL (ref <20)
Nordiazepam: 58 ng/mL — ABNORMAL HIGH (ref <50)
Opiates: NEGATIVE ng/mL (ref <100)
Oxazepam: 221 ng/mL — ABNORMAL HIGH (ref <50)
Oxidant: NEGATIVE ug/mL (ref <200)
Oxycodone: NEGATIVE ng/mL (ref <100)
Temazepam: 162 ng/mL — ABNORMAL HIGH (ref <50)
pH: 7.09 (ref 4.5–9.0)

## 2018-04-08 ENCOUNTER — Encounter: Payer: Self-pay | Admitting: Physical Therapy

## 2018-04-08 ENCOUNTER — Ambulatory Visit: Payer: Medicare Other | Admitting: Physical Therapy

## 2018-04-08 DIAGNOSIS — M6281 Muscle weakness (generalized): Secondary | ICD-10-CM | POA: Diagnosis not present

## 2018-04-08 DIAGNOSIS — M25611 Stiffness of right shoulder, not elsewhere classified: Secondary | ICD-10-CM | POA: Diagnosis not present

## 2018-04-08 DIAGNOSIS — M25511 Pain in right shoulder: Secondary | ICD-10-CM | POA: Diagnosis not present

## 2018-04-08 DIAGNOSIS — G8929 Other chronic pain: Secondary | ICD-10-CM

## 2018-04-08 NOTE — Patient Instructions (Signed)
        Extension (Isometric)    Sujetar _5___ seconds. Repeat __10__ times. Do __1__ sessions per day.  Internal Rotation (Isometric)   Sujetar___5_ seconds. Repeat _10_ times.  poquito dolor.     Copyright  VHI. All rights reserved.
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
## 2018-04-15 ENCOUNTER — Encounter: Payer: Medicare Other | Admitting: Physical Therapy

## 2018-05-04 ENCOUNTER — Other Ambulatory Visit: Payer: Self-pay | Admitting: Internal Medicine

## 2018-05-08 ENCOUNTER — Ambulatory Visit: Payer: Medicare Other | Admitting: Physical Therapy

## 2018-05-13 ENCOUNTER — Encounter (INDEPENDENT_AMBULATORY_CARE_PROVIDER_SITE_OTHER): Payer: Self-pay | Admitting: Orthopaedic Surgery

## 2018-05-13 ENCOUNTER — Ambulatory Visit (INDEPENDENT_AMBULATORY_CARE_PROVIDER_SITE_OTHER): Payer: Medicare Other | Admitting: Orthopaedic Surgery

## 2018-05-13 DIAGNOSIS — G8929 Other chronic pain: Secondary | ICD-10-CM | POA: Diagnosis not present

## 2018-05-13 DIAGNOSIS — M25511 Pain in right shoulder: Secondary | ICD-10-CM | POA: Diagnosis not present

## 2018-05-13 DIAGNOSIS — S43014D Anterior dislocation of right humerus, subsequent encounter: Secondary | ICD-10-CM
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
## 2018-05-15 ENCOUNTER — Encounter: Payer: Self-pay | Admitting: Physical Therapy

## 2018-05-15 ENCOUNTER — Ambulatory Visit: Payer: Medicare Other | Attending: Orthopaedic Surgery | Admitting: Physical Therapy

## 2018-05-15 DIAGNOSIS — M25511 Pain in right shoulder: Secondary | ICD-10-CM | POA: Diagnosis not present

## 2018-05-15 DIAGNOSIS — G8929 Other chronic pain: Secondary | ICD-10-CM | POA: Diagnosis not present

## 2018-05-15 DIAGNOSIS — M6281 Muscle weakness (generalized): Secondary | ICD-10-CM

## 2018-05-15 DIAGNOSIS — M25611 Stiffness of right shoulder, not elsewhere classified: Secondary | ICD-10-CM | POA: Diagnosis not present
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
## 2018-05-20 DIAGNOSIS — S0231XA Fracture of orbital floor, right side, initial encounter for closed fracture: Secondary | ICD-10-CM | POA: Diagnosis not present

## 2018-05-20 DIAGNOSIS — H26493 Other secondary cataract, bilateral: Secondary | ICD-10-CM | POA: Diagnosis not present

## 2018-05-20 DIAGNOSIS — H401131 Primary open-angle glaucoma, bilateral, mild stage: Secondary | ICD-10-CM | POA: Diagnosis not present

## 2018-05-20 DIAGNOSIS — E119 Type 2 diabetes mellitus without complications: Secondary | ICD-10-CM | POA: Diagnosis not present

## 2018-05-20 DIAGNOSIS — H532 Diplopia: Secondary | ICD-10-CM | POA: Diagnosis not present

## 2018-05-23 ENCOUNTER — Encounter: Payer: Medicare Other | Admitting: Physical Therapy

## 2018-05-24 ENCOUNTER — Telehealth: Payer: Self-pay | Admitting: Neurology

## 2018-05-24 NOTE — Telephone Encounter (Signed)
Patient came by the office today to let Dr. Tomi Likens know that she is doing well on her Gabapentin. Thanks

## 2018-05-27 ENCOUNTER — Ambulatory Visit: Payer: Medicare Other | Admitting: Physical Therapy

## 2018-05-28 ENCOUNTER — Encounter: Payer: Self-pay | Admitting: Internal Medicine

## 2018-05-28 ENCOUNTER — Ambulatory Visit (INDEPENDENT_AMBULATORY_CARE_PROVIDER_SITE_OTHER): Payer: Medicare Other | Admitting: Internal Medicine

## 2018-05-28 VITALS — BP 126/68 | HR 69 | Temp 98.2°F | Resp 16 | Ht 61.0 in | Wt 143.1 lb

## 2018-05-28 DIAGNOSIS — E785 Hyperlipidemia, unspecified: Secondary | ICD-10-CM | POA: Diagnosis not present

## 2018-05-28 DIAGNOSIS — E538 Deficiency of other specified B group vitamins: Secondary | ICD-10-CM | POA: Diagnosis not present

## 2018-05-28 DIAGNOSIS — R7989 Other specified abnormal findings of blood chemistry: Secondary | ICD-10-CM

## 2018-05-28 DIAGNOSIS — Z0000 Encounter for general adult medical examination without abnormal findings: Secondary | ICD-10-CM

## 2018-05-28 DIAGNOSIS — R748 Abnormal levels of other serum enzymes: Secondary | ICD-10-CM | POA: Diagnosis not present

## 2018-05-28 DIAGNOSIS — E119 Type 2 diabetes mellitus without complications: Secondary | ICD-10-CM | POA: Diagnosis not present

## 2018-05-28 DIAGNOSIS — M810 Age-related osteoporosis without current pathological fracture: Secondary | ICD-10-CM

## 2018-05-28 DIAGNOSIS — E569 Vitamin deficiency, unspecified: Secondary | ICD-10-CM | POA: Diagnosis not present

## 2018-05-28 LAB — B12 AND FOLATE PANEL
Folate: >24 ng/mL (ref >5.9)
Vitamin B-12: 1500 pg/mL — ABNORMAL HIGH (ref 211–911)

## 2018-05-28 LAB — CBC WITH DIFFERENTIAL/PLATELET
Basophils Absolute: 0.1 10*3/uL (ref 0.0–0.1)
Basophils Relative: 1.2 % (ref 0.0–3.0)
Eosinophils Absolute: 1.4 10*3/uL — ABNORMAL HIGH (ref 0.0–0.7)
Eosinophils Relative: 15.8 % — ABNORMAL HIGH (ref 0.0–5.0)
HCT: 41.8 % (ref 36.0–46.0)
Hemoglobin: 14.2 g/dL (ref 12.0–15.0)
Lymphocytes Relative: 26.4 % (ref 12.0–46.0)
Lymphs Abs: 2.3 10*3/uL (ref 0.7–4.0)
MCHC: 33.8 g/dL (ref 30.0–36.0)
MCV: 91.8 fl (ref 78.0–100.0)
Monocytes Absolute: 0.4 10*3/uL (ref 0.1–1.0)
Monocytes Relative: 5.1 % (ref 3.0–12.0)
Neutro Abs: 4.5 10*3/uL (ref 1.4–7.7)
Neutrophils Relative %: 51.5 % (ref 43.0–77.0)
Platelets: 343 10*3/uL (ref 150.0–400.0)
RBC: 4.56 Mil/uL (ref 3.87–5.11)
RDW: 12.5 % (ref 11.5–15.5)
WBC: 8.8 10*3/uL (ref 4.0–10.5)

## 2018-05-28 LAB — COMPREHENSIVE METABOLIC PANEL
ALT: 15 U/L (ref 0–35)
AST: 23 U/L (ref 0–37)
Albumin: 4.6 g/dL (ref 3.5–5.2)
Alkaline Phosphatase: 73 U/L (ref 39–117)
BUN: 21 mg/dL (ref 6–23)
CO2: 30 mEq/L (ref 19–32)
Calcium: 10.2 mg/dL (ref 8.4–10.5)
Chloride: 99 mEq/L (ref 96–112)
Creatinine, Ser: 0.88 mg/dL (ref 0.40–1.20)
GFR: 61.46 mL/min (ref >60.00)
Glucose, Bld: 162 mg/dL — ABNORMAL HIGH (ref 70–99)
Potassium: 4.7 mEq/L (ref 3.5–5.1)
Sodium: 137 mEq/L (ref 135–145)
Total Bilirubin: 0.5 mg/dL (ref 0.2–1.2)
Total Protein: 7.8 g/dL (ref 6.0–8.3)

## 2018-05-28 LAB — HIGH SENSITIVITY CRP: CRP, High Sensitivity: 8.12 mg/L — ABNORMAL HIGH (ref 0.000–5.000)

## 2018-05-28 LAB — HEMOGLOBIN A1C: Hgb A1c MFr Bld: 6.8 % — ABNORMAL HIGH (ref 4.6–6.5)

## 2018-05-28 NOTE — Patient Instructions (Addendum)
Please schedule Medicare Wellness with Glenard Haring.   GO TO THE LAB : Get the blood work     GO TO THE FRONT DESK Schedule your next appointment   for a checkup in 6 months    Fall Prevention in the Home, Adult Falls can cause injuries and can affect people from all age groups. There are many simple things that you can do to make your home safe and to help prevent falls. Ask for help when making these changes, if needed. What actions can I take to prevent falls? General instructions  Use good lighting in all rooms. Replace any light bulbs that burn out.  Turn on lights if it is dark. Use night-lights.  Place frequently used items in easy-to-reach places. Lower the shelves around your home if necessary.  Set up furniture so that there are clear paths around it. Avoid moving your furniture around.  Remove throw rugs and other tripping hazards from the floor.  Avoid walking on wet floors.  Fix any uneven floor surfaces.  Add color or contrast paint or tape to grab bars and handrails in your home. Place contrasting color strips on the first and last steps of stairways.  When you use a stepladder, make sure that it is completely opened and that the sides are firmly locked. Have someone hold the ladder while you are using it. Do not climb a closed stepladder.  Be aware of any and all pets. What can I do in the bathroom?      Keep the floor dry. Immediately clean up any water that spills onto the floor.  Remove soap buildup in the tub or shower on a regular basis.  Use non-skid mats or decals on the floor of the tub or shower.  Attach bath mats securely with double-sided, non-slip rug tape.  If you need to sit down while you are in the shower, use a plastic, non-slip stool.  Install grab bars by the toilet and in the tub and shower. Do not use towel bars as grab bars. What can I do in the bedroom?  Make sure that a bedside light is easy to reach.  Do not use oversized bedding  that drapes onto the floor.  Have a firm chair that has side arms to use for getting dressed. What can I do in the kitchen?  Clean up any spills right away.  If you need to reach for something above you, use a sturdy step stool that has a grab bar.  Keep electrical cables out of the way.  Do not use floor polish or wax that makes floors slippery. If you must use wax, make sure that it is non-skid floor wax. What can I do in the stairways?  Do not leave any items on the stairs.  Make sure that you have a light switch at the top of the stairs and the bottom of the stairs. Have them installed if you do not have them.  Make sure that there are handrails on both sides of the stairs. Fix handrails that are broken or loose. Make sure that handrails are as long as the stairways.  Install non-slip stair treads on all stairs in your home.  Avoid having throw rugs at the top or bottom of stairways, or secure the rugs with carpet tape to prevent them from moving.  Choose a carpet design that does not hide the edge of steps on the stairway.  Check any carpeting to make sure that it is firmly  attached to the stairs. Fix any carpet that is loose or worn. What can I do on the outside of my home?  Use bright outdoor lighting.  Regularly repair the edges of walkways and driveways and fix any cracks.  Remove high doorway thresholds.  Trim any shrubbery on the main path into your home.  Regularly check that handrails are securely fastened and in good repair. Both sides of any steps should have handrails.  Install guardrails along the edges of any raised decks or porches.  Clear walkways of debris and clutter, including tools and rocks.  Have leaves, snow, and ice cleared regularly.  Use sand or salt on walkways during winter months.  In the garage, clean up any spills right away, including grease or oil spills. What other actions can I take?  Wear closed-toe shoes that fit well and  support your feet. Wear shoes that have rubber soles or low heels.  Use mobility aids as needed, such as canes, walkers, scooters, and crutches.  Review your medicines with your health care provider. Some medicines can cause dizziness or changes in blood pressure, which increase your risk of falling. Talk with your health care provider about other ways that you can decrease your risk of falls. This may include working with a physical therapist or trainer to improve your strength, balance, and endurance. Where to find more information  Centers for Disease Control and Prevention, STEADI: WebmailGuide.co.za  [employer] on Aging: BrainJudge.co.uk Contact a health care provider if:  You are afraid of falling at home.  You feel weak, drowsy, or dizzy at home.  You fall at home. Summary  There are many simple things that you can do to make your home safe and to help prevent falls.  Ways to make your home safe include removing tripping hazards and installing grab bars in the bathroom.  Ask for help when making these changes in your home. This information is not intended to replace advice given to you by your health care provider. Make sure you discuss any questions you have with your health care provider. Document Released: 03/24/2002 Document Revised: 11/16/2016 Document Reviewed: 11/16/2016 Elsevier Interactive Patient Education  2019 [employer].

## 2018-05-28 NOTE — Progress Notes (Signed)
Subjective:    Patient ID: Patricia Moore, female    DOB: 18-Jun-1933, 85 y.o.   MRN: [id number]  DOS:  05/28/2018 Type of visit - description: CPX Since the last office visit she is doing well.    Review of Systems Continue weight right shoulder decreased range of motion. Still has occasional mild pain at the neck that responds to baclofen and a heating pad. Had severe facial pain: Now better. We review her ambulatory CBGs, medications and labs. Has chronic symptoms at baseline.  Other than above, a 14 point review of systems is negative   Past Medical History:  Diagnosis Date  . Allergy   . Anxiety 12/09/2013  . Arthritis   . Back pain   . Diabetes mellitus   . Diverticulitis    patient states has had three attacks   . Diverticulosis   . DJD (degenerative joint disease) 12/14/2014  . H. pylori infection 2001  . Hx of adenomatous colonic polyps   . Hyperlipidemia   . Hypertension   . Intraductal papilloma    bx nef x 2 in the 70s-90s  . Osteoporosis   . Saccular aneurysm   . SCC (squamous cell carcinoma) 03/2011   sees dermatology    Past Surgical History:  Procedure Laterality Date  . BREAST BIOPSY  '91, '95   INTRADUCTAL PAPILLOMA  . BREAST LUMPECTOMY    . CATARACT EXTRACTION Bilateral 09/2014  . CLOSED REDUCTION ULNAR SHAFT Right 10/06/2017   Procedure: CLOSED REDUCTION DISLOCATED RIGHT SHOULDER;  Surgeon: Kathryne Hitch, MD;  Location: [facility] OR;  Service: Orthopedics;  Laterality: Right;    Social History   Socioeconomic History  . Marital status: Married    Spouse name: Not on file  . Number of children: 1  . Years of education: Not on file  . Highest education level: Not on file  Occupational History  . Occupation: retired     Associate Professor: [employer]  Social Needs  . Financial resource strain: Not on file  . Food insecurity:    Worry: Not on file    Inability: Not on file  . Transportation needs:    Medical: Not on file    Non-medical: Not on  file  Tobacco Use  . Smoking status: Never Smoker  . Smokeless tobacco: Never Used  Substance and Sexual Activity  . Alcohol use: Never    Frequency: Never  . Drug use: No  . Sexual activity: Not Currently  Lifestyle  . Physical activity:    Days per week: Not on file    Minutes per session: Not on file  . Stress: Not on file  Relationships  . Social connections:    Talks on phone: Not on file    Gets together: Not on file    Attends religious service: Not on file    Active member of club or organization: Not on file    Attends meetings of clubs or organizations: Not on file    Relationship status: Not on file  . Intimate partner violence:    Fear of current or ex partner: Not on file    Emotionally abused: Not on file    Physically abused: Not on file    Forced sexual activity: Not on file  Other Topics Concern  . Not on file  Social History Narrative   Born in [place], ,moved to the [place] in the 60s, live in [place] then in [place]   Lives w/ husband   H.S. Buyer, retail  Son lives in [place])      Family History  Adopted: Yes  Problem Relation Age of Onset  . Heart disease Mother 64  . Heart disease Father 36  . Breast cancer Maternal Aunt   . Stroke Maternal Aunt   . Colon cancer Paternal Uncle        7 uncles  . Colon cancer Maternal Grandfather        COLON,, FAMILY HX 7 MEMBERS  . Breast cancer Maternal Aunt   . Cancer Sister        skin  . Hypertension Neg Hx   . Diabetes Neg Hx      Allergies as of 05/28/2018   No Known Allergies     Medication List       Accurate as of May 28, 2018 11:59 PM. Always use your most recent med list.        albuterol 108 (90 Base) MCG/ACT inhaler Commonly known as:  PROVENTIL HFA;VENTOLIN HFA Inhale 2 puffs into the lungs every 6 (six) hours as needed for wheezing or shortness of breath.   ALPRAZolam 0.25 MG tablet Commonly known as:  XANAX Take 1 tablet (0.25 mg total) by mouth daily as needed.   aspirin 81 MG  tablet Take 81 mg by mouth daily.   baclofen 10 MG tablet Commonly known as:  LIORESAL Take 1 tablet (10 mg total) by mouth daily as needed for muscle spasms.   carvedilol 6.25 MG tablet Commonly known as:  COREG Take 1 tablet (6.25 mg total) by mouth 2 (two) times daily with a meal.   cholecalciferol 1000 units tablet Commonly known as:  VITAMIN D Take 1,000 Units by mouth daily.   co-enzyme Q-10 30 MG capsule Take 30 mg by mouth daily.   diazepam 5 MG tablet Commonly known as:  VALIUM Take 5 mg by mouth 4 (four) times daily as needed (dizziness).   docusate sodium 100 MG capsule Commonly known as:  COLACE Take 1 capsule (100 mg total) by mouth 2 (two) times daily.   dorzolamide-timolol 22.3-6.8 MG/ML ophthalmic solution Commonly known as:  COSOPT Place 1 drop into both eyes 2 (two) times daily.   folic acid 1 MG tablet Commonly known as:  FOLVITE Take 1 tablet (1 mg total) by mouth daily.   glucose blood test strip Commonly known as:  ONE TOUCH ULTRA TEST CHECK BLOOD SUGAR NO MORE THAN 2 TIMES DAILY   glyBURIDE 2.5 MG tablet Commonly known as:  DIABETA Take 1 tablet (2.5 mg total) by mouth 2 (two) times daily with a meal.   hydrocortisone 2.5 % cream Apply topically 2 (two) times daily.   metFORMIN 1000 MG tablet Commonly known as:  GLUCOPHAGE TAKE 1 TABLET BY MOUTH WITH BREAKFAST, 1 TABLET WITH LUNCH AND 1/2 TABLET WITH DINNER DAILY   multivitamins with iron Tabs tablet Take 1 tablet by mouth daily.   onetouch ultrasoft lancets CHECK BLOOD SUGAR NO MORE THAN 2 TIMES DAILY   polyethylene glycol packet Commonly known as:  MIRALAX / GLYCOLAX Take 17 g by mouth daily.           Objective:   Physical Exam BP 126/68 (BP Location: Left Arm, Patient Position: Sitting, Cuff Size: Normal)   Pulse 69   Temp 98.2 F (36.8 C) (Oral)   Resp 16   Ht 5\' 1"  (1.549 m)   Wt 143 lb 2 oz (64.9 kg)   LMP 04/17/1988   SpO2 97%   BMI 27.04  kg/m  General: Well  developed, NAD, BMI noted Neck: No  thyromegaly  HEENT:  Normocephalic . Face symmetric, atraumatic Lungs:  CTA B Normal respiratory effort, no intercostal retractions, no accessory muscle use.  Heart: RRR,  no murmur.  No pretibial edema bilaterally  Abdomen:  Not distended, soft, non-tender. No rebound or rigidity.   Skin: Exposed areas without rash. Not pale. Not jaundice MSK: Right shoulder range of motion severely decreased. Neurologic:  alert & oriented X3.  Speech normal, gait appropriate for age and unassisted.  Needs some assist when transferring to the table. Strength symmetric and appropriate for age.  Psych: Cognition and judgment appear intact.  Cooperative with normal attention span and concentration.  Behavior appropriate. No anxious or depressed appearing.     Assessment     Assessment  DM HTN -Losartan d/c d/t hyperkalemia -Amlodipine d/c d/t edema 07-2017 - HCTZ d/c d/t hyperkalemia 08/2017 Hyperlipidemia -- intolerant to lipitor and crestor (severe cramps).  Declined further trial with medications  on 01/16/2018   Anxiety- xanax rx by pcp Vertigo - valium prn GI: --Recurrent diverticulitis (one episode documented by a CT 11-2014) --colonoscopy 10-2013, Dr. Marina Goodell, + polyps, melanosis coli, tics --H. pylori 2001 DJD--back pain, knees, neck pain ( on baclofen for neck) Osteoporosis : Tscore -1.9 on 11-2015 SCC, skin cancer Elevated homocysteine Raynaud's phenomena  NEURO --Carotid US 10-2013: No significant plaque, although ICA's are serpentine, bilaterally. 40-59% bilateral ICA stenosis by velocity criteria likely due to tortuosity. Patent vertebral arteries with antegrade flow.Normal subclavian arteries, bilaterally. -- Transinet dizziness, diplopia sx late 2016 , saw neuro >>> Dx w/ TIA/Stroke on clinical grounds  W/u:  Brain  MRI 03-2015 >> 2 mm saccular aneurysm at the right posterior communicating artery origin, saw  Neurology, Rx to recheck MRI 1 year, ok  continue ASA per neuro CTA head, neck 05-14-2015: aneurysm confirmed, otherwise (-) --brain aneurysm see above, saw neuro 2018, consider repeat imagine 2020 --Admitted 07-2015, dizziness, MRI negative, DX labyrinthitis   PLAN: DM: Currently on glyburide, metformin, check A1c HTN: Reports normal BPs at home, continue carvedilol. Hyperlipidemia: Has declined consistently medications.  Will not check a FLP. DJD: Follow-up by Ortho, neck pain control with baclofen as needed, range of motion of the shoulder decrease, Ortho decide not to pursue surgery. Neurology: Was seen 11/19 which facial pain, took Neurontin temporarily, it helped, she already stop it and symptoms have not come back. Elevated homocysteine: Checking labs Osteoporosis: Check a bone density test RTC 6 months

## 2018-05-28 NOTE — Assessment & Plan Note (Addendum)
--  Td 2019; Pneumonia shot 2014 declined booster d/t h/o poor tolerance; prevnar:01-2015; zostavax: 2011; had a flu shot  CCS: -CCS: Last colonoscopy 10-2013, + polyps,  per GI no f/u d/t age  - Female care : no further PAPs per gyn note; h/o fibroid tumor, no need to treat unless symptomatic  MMG 10/2017   -Counseled: Fall prevention - labs CMP, CBC, A1c, B12, homocystine

## 2018-05-28 NOTE — Progress Notes (Signed)
Pre visit review using our clinic review tool, if applicable. No additional management support is needed unless otherwise documented below in the visit note. 

## 2018-05-29 LAB — HOMOCYSTEINE: Homocysteine: 9.7 umol/L (ref <10.4)

## 2018-05-29 NOTE — Assessment & Plan Note (Signed)
DM: Currently on glyburide, metformin, check A1c HTN: Reports normal BPs at home, continue carvedilol. Hyperlipidemia: Has declined consistently medications.  Will not check a FLP. DJD: Follow-up by Ortho, neck pain control with baclofen as needed, range of motion of the shoulder decrease, Ortho decide not to pursue surgery. Neurology: Was seen 11/19 which facial pain, took Neurontin temporarily, it helped, she already stop it and symptoms have not come back. Elevated homocysteine: Checking labs Osteoporosis: Check a bone density test RTC 6 months

## 2018-06-03 ENCOUNTER — Encounter: Payer: Medicare Other | Admitting: Physical Therapy
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
## 2018-06-07 ENCOUNTER — Ambulatory Visit (INDEPENDENT_AMBULATORY_CARE_PROVIDER_SITE_OTHER): Payer: Medicare Other | Admitting: Neurology

## 2018-06-07 ENCOUNTER — Encounter: Payer: Self-pay | Admitting: Neurology

## 2018-06-07 VITALS — BP 138/80 | HR 83 | Ht 61.0 in | Wt 146.0 lb

## 2018-06-07 DIAGNOSIS — R51 Headache: Secondary | ICD-10-CM | POA: Diagnosis not present

## 2018-06-07 DIAGNOSIS — R519 Headache, unspecified: Secondary | ICD-10-CM

## 2018-06-07 NOTE — Patient Instructions (Addendum)
Follow up with your cardiologist regarding the passing out spell Follow up as needed.

## 2018-06-10 ENCOUNTER — Encounter: Payer: Medicare Other | Admitting: Physical Therapy

## 2018-06-24 ENCOUNTER — Ambulatory Visit (INDEPENDENT_AMBULATORY_CARE_PROVIDER_SITE_OTHER): Payer: Medicare Other | Admitting: Orthopaedic Surgery

## 2018-06-24 ENCOUNTER — Ambulatory Visit (INDEPENDENT_AMBULATORY_CARE_PROVIDER_SITE_OTHER): Payer: Self-pay

## 2018-06-24 DIAGNOSIS — M25511 Pain in right shoulder: Secondary | ICD-10-CM
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
## 2018-06-26 ENCOUNTER — Other Ambulatory Visit: Payer: Self-pay | Admitting: Internal Medicine

## 2018-07-03 ENCOUNTER — Other Ambulatory Visit: Payer: Self-pay | Admitting: Internal Medicine

## 2018-07-05 ENCOUNTER — Telehealth: Payer: Self-pay | Admitting: Cardiology

## 2018-07-05 NOTE — Telephone Encounter (Signed)
   Primary Cardiologist:  Dr Percival Spanish  Patient contacted.  History reviewed.  No symptoms to suggest any unstable cardiac conditions.  Based on discussion, with current pandemic situation, we will be postponing this appointment for Patricia Moore.  If symptoms change, she has been instructed to contact our office.   Routing to C19 CANCEL pool for tracking (P CV DIV CV19 CANCEL) and assigning priority (1 = 4-6 wks, 2 = 6-12 wks, 3 = >12 wks).  Kerin Ransom, [place]  07/05/2018 1:29 PM         .

## 2018-07-09 ENCOUNTER — Encounter

## 2018-07-09 ENCOUNTER — Ambulatory Visit: Payer: Medicare Other | Admitting: Neurology

## 2018-07-10 ENCOUNTER — Ambulatory Visit: Payer: Medicare Other | Admitting: Cardiology

## 2018-07-16 ENCOUNTER — Telehealth: Payer: Self-pay

## 2018-07-16 NOTE — Telephone Encounter (Signed)
TELEPHONE CALL NOTE  Patricia Moore has been deemed a candidate for a follow-up tele-health visit to limit community exposure during the Covid-19 pandemic. I spoke with the patient via phone to ensure availability of phone/video source, confirm preferred email & phone number, and discuss instructions and expectations.  I reminded Patricia Moore to be prepared with any vital sign and/or heart rhythm information that could potentially be obtained via home monitoring, at the time of her visit. I reminded Patricia Moore to expect a phone call at the time of her visit if her visit.  Did the patient verbally acknowledge consent to treatment? Patient provided verbal consent.  Mitzie Na, [place] 07/16/2018 11:13 AM   DOWNLOADING THE WEBEX SOFTWARE TO SMARTPHONE  - If Apple, go to [employer] and type in WebEx in the search bar. [place] [employer], the blue/green circle. The app is free but as with any other app downloads, their phone may require them to verify saved payment information or Apple password. The patient does NOT have to create an account.  - If Android, ask patient to go to [employer] and type in WebEx in the search bar. [place] [employer], the blue/green circle. The app is free but as with any other app downloads, their phone may require them to verify saved payment information or Android password. The patient does NOT have to create an account.   CONSENT FOR TELE-HEALTH VISIT - PLEASE REVIEW  I hereby voluntarily request, consent and authorize [facility] and its employed or contracted physicians, physician assistants, nurse practitioners or other licensed health care professionals (the Practitioner), to provide me with telemedicine health care services (the "Services") as deemed necessary by the treating Practitioner. I acknowledge and consent to receive the Services by the Practitioner via telemedicine. I understand that the telemedicine visit  will involve communicating with the Practitioner through live audiovisual communication technology and the disclosure of certain medical information by electronic transmission. I acknowledge that I have been given the opportunity to request an in-person assessment or other available alternative prior to the telemedicine visit and am voluntarily participating in the telemedicine visit.  I understand that I have the right to withhold or withdraw my consent to the use of telemedicine in the course of my care at any time, without affecting my right to future care or treatment, and that the Practitioner or I may terminate the telemedicine visit at any time. I understand that I have the right to inspect all information obtained and/or recorded in the course of the telemedicine visit and may receive copies of available information for a reasonable fee.  I understand that some of the potential risks of receiving the Services via telemedicine include:  Marland Kitchen Delay or interruption in medical evaluation due to technological equipment failure or disruption; . Information transmitted may not be sufficient (e.g. poor resolution of images) to allow for appropriate medical decision making by the Practitioner; and/or  . In rare instances, security protocols could fail, causing a breach of personal health information.  Furthermore, I acknowledge that it is my responsibility to provide information about my medical history, conditions and care that is complete and accurate to the best of my ability. I acknowledge that Practitioner's advice, recommendations, and/or decision may be based on factors not within their control, such as incomplete or inaccurate data provided by me or distortions of diagnostic images or specimens that may result from electronic transmissions. I understand that the  practice of medicine is not an Chief Strategy Officer and that Practitioner makes no warranties or guarantees regarding treatment outcomes. I acknowledge  that I will receive a copy of this consent concurrently upon execution via email to the email address I last provided but may also request a printed copy by calling the office of [facility].    I understand that my insurance will be billed for this visit.   I have read or had this consent read to me. . I understand the contents of this consent, which adequately explains the benefits and risks of the Services being provided via telemedicine.  . I have been provided ample opportunity to ask questions regarding this consent and the Services and have had my questions answered to my satisfaction. . I give my informed consent for the services to be provided through the use of telemedicine in my medical care  By participating in this telemedicine visit I agree to the above.

## 2018-07-17 ENCOUNTER — Other Ambulatory Visit: Payer: Self-pay

## 2018-07-18 ENCOUNTER — Encounter: Payer: Self-pay | Admitting: Cardiology

## 2018-07-18 ENCOUNTER — Telehealth (INDEPENDENT_AMBULATORY_CARE_PROVIDER_SITE_OTHER): Payer: Medicare Other | Admitting: Cardiology

## 2018-07-18 VITALS — BP 164/87 | HR 63 | Ht 61.0 in | Wt 150.0 lb

## 2018-07-18 DIAGNOSIS — R55 Syncope and collapse: Secondary | ICD-10-CM | POA: Diagnosis not present

## 2018-07-18 DIAGNOSIS — I340 Nonrheumatic mitral (valve) insufficiency: Secondary | ICD-10-CM | POA: Insufficient documentation
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
## 2018-07-18 NOTE — Patient Instructions (Signed)
Medication Instructions:  Continue current medications ' If you need a refill on your cardiac medications before your next appointment, please call your pharmacy.  Labwork: None Ordered   Testing/Procedures: Your physician has requested that you have an echocardiogram in 3 months. Echocardiography is a painless test that uses sound waves to create images of your heart. It provides your doctor with information about the size and shape of your heart and how well your heart's chambers and valves are working. This procedure takes approximately one hour. There are no restrictions for this procedure.  Your physician has requested that you have a carotid duplex in 3 months. This test is an ultrasound of the carotid arteries in your neck. It looks at blood flow through these arteries that supply the brain with blood. Allow one hour for this exam. There are no restrictions or special instructions.   Follow-Up: You will need a follow up appointment in 4 months.  Please call our office 2 months in advance to schedule this appointment.  You may see Minus Breeding, MD or one of the following Advanced Practice Providers on your designated Care Team:   Rosaria Ferries, PA-C . Jory Sims, DNP, ANP      At [facility], you and your health needs are our priority.  As part of our continuing mission to provide you with exceptional heart care, we have created designated Provider Care Teams.  These Care Teams include your primary Cardiologist (physician) and Advanced Practice Providers (APPs -  Physician Assistants and Nurse Practitioners) who all work together to provide you with the care you need, when you need it.  Thank you for choosing [facility] at [facility]!!

## 2018-07-19 ENCOUNTER — Ambulatory Visit (INDEPENDENT_AMBULATORY_CARE_PROVIDER_SITE_OTHER): Payer: Medicare Other | Admitting: Internal Medicine

## 2018-07-19 ENCOUNTER — Other Ambulatory Visit: Payer: Self-pay

## 2018-07-19 ENCOUNTER — Ambulatory Visit: Payer: Self-pay | Admitting: Internal Medicine

## 2018-07-19 DIAGNOSIS — K5732 Diverticulitis of large intestine without perforation or abscess without bleeding: Secondary | ICD-10-CM

## 2018-07-19 MED ORDER — METRONIDAZOLE 500 MG PO TABS: 500.0000 mg | ORAL_TABLET | Freq: Three times a day (TID) | ORAL | 0 refills | Status: DC

## 2018-07-19 MED ORDER — CIPROFLOXACIN HCL 500 MG PO TABS: 500.0000 mg | ORAL_TABLET | Freq: Two times a day (BID) | ORAL | 0 refills | Status: DC

## 2018-07-19 NOTE — Progress Notes (Signed)
Subjective:    Patient ID: Patricia Moore, female    DOB: 10/15/1933, 85 y.o.   MRN: [id number]  DOS:  07/19/2018 Type of visit - description: Virtual Visit via Telephone Note  I connected with@ on 07/22/18 at  2:20 PM EDT by telephone and verified that I am speaking with the correct person using two identifiers.  THIS ENCOUNTER IS A VIRTUAL VISIT DUE TO COVID-19 - PATIENT WAS NOT SEEN IN THE OFFICE. PATIENT HAS CONSENTED TO VIRTUAL VISIT / TELEMEDICINE VISIT   Location of patient: home  Location of provider: office  I discussed the limitations, risks, security and privacy concerns of performing an evaluation and management service by telephone and the availability of in person appointments. I also discussed with the patient that there may be a patient responsible charge related to this service. The patient expressed understanding and agreed to proceed.   History of Present Illness: Acute visit 2 days ago started with a steady, left, lower abdominal discomfort.  Area is slightly TTP today. She reports that this is typically what happened when she starts w/diverticulitis and request antibiotics. The bowel movements have been normal with no diarrhea or blood.  No constipation.   Review of Systems Denies fever chills. No nausea, vomiting, diarrhea.  No blood in the stools. No dysuria or gross hematuria. As far as a coronavirus pandemia: Denies cough, chest pain, difficulty breathing.  Past Medical History:  Diagnosis Date  . Allergy   . Anxiety 12/09/2013  . Arthritis   . Diabetes mellitus   . Diverticulitis    patient states has had three attacks   . Diverticulosis   . DJD (degenerative joint disease) 12/14/2014  . H. pylori infection 2001  . Hx of adenomatous colonic polyps   . Hyperlipidemia   . Hypertension   . Intraductal papilloma    bx nef x 2 in the 70s-90s  . Osteoporosis   . Saccular aneurysm   . SCC (squamous cell carcinoma) 03/2011   sees dermatology    Past  Surgical History:  Procedure Laterality Date  . BREAST BIOPSY  '91, '95   INTRADUCTAL PAPILLOMA  . BREAST LUMPECTOMY    . CATARACT EXTRACTION Bilateral 09/2014  . CLOSED REDUCTION ULNAR SHAFT Right 10/06/2017   Procedure: CLOSED REDUCTION DISLOCATED RIGHT SHOULDER;  Surgeon: Kathryne Hitch, MD;  Location: [facility] OR;  Service: Orthopedics;  Laterality: Right;    Social History   Socioeconomic History  . Marital status: Married    Spouse name: Not on file  . Number of children: 1  . Years of education: Not on file  . Highest education level: Not on file  Occupational History  . Occupation: retired     Associate Professor: [employer]  Social Needs  . Financial resource strain: Not on file  . Food insecurity:    Worry: Not on file    Inability: Not on file  . Transportation needs:    Medical: Not on file    Non-medical: Not on file  Tobacco Use  . Smoking status: Never Smoker  . Smokeless tobacco: Never Used  Substance and Sexual Activity  . Alcohol use: Never    Frequency: Never  . Drug use: No  . Sexual activity: Not Currently  Lifestyle  . Physical activity:    Days per week: Not on file    Minutes per session: Not on file  . Stress: Not on file  Relationships  . Social connections:    Talks on phone: Not on  file    Gets together: Not on file    Attends religious service: Not on file    Active member of club or organization: Not on file    Attends meetings of clubs or organizations: Not on file    Relationship status: Not on file  . Intimate partner violence:    Fear of current or ex partner: Not on file    Emotionally abused: Not on file    Physically abused: Not on file    Forced sexual activity: Not on file  Other Topics Concern  . Not on file  Social History Narrative   Born in [place], ,moved to the [place] in the 60s, live in [place] then in [place]   Lives w/ husband   H.S. Buyer, retail   Son lives in [place])       Allergies as of 07/19/2018   No Known Allergies      Medication List       Accurate as of July 19, 2018 11:59 PM. Always use your most recent med list.        ALPRAZolam 0.25 MG tablet Commonly known as:  XANAX Take 1 tablet (0.25 mg total) by mouth daily as needed.   aspirin 81 MG tablet Take 81 mg by mouth daily.   carvedilol 6.25 MG tablet Commonly known as:  COREG Take 1 tablet (6.25 mg total) by mouth 2 (two) times daily with a meal.   cholecalciferol 1000 units tablet Commonly known as:  VITAMIN D Take 1,000 Units by mouth daily.   ciprofloxacin 500 MG tablet Commonly known as:  CIPRO Take 1 tablet (500 mg total) by mouth 2 (two) times daily.   co-enzyme Q-10 30 MG capsule Take 30 mg by mouth daily.   diazepam 5 MG tablet Commonly known as:  VALIUM Take 5 mg by mouth 4 (four) times daily as needed (dizziness).   docusate sodium 100 MG capsule Commonly known as:  COLACE Take 1 capsule (100 mg total) by mouth 2 (two) times daily.   dorzolamide-timolol 22.3-6.8 MG/ML ophthalmic solution Commonly known as:  COSOPT Place 1 drop into both eyes 2 (two) times daily.   folic acid 1 MG tablet Commonly known as:  FOLVITE Take 1 tablet (1 mg total) by mouth daily.   glucose blood test strip Commonly known as:  ONE TOUCH ULTRA TEST CHECK BLOOD SUGAR NO MORE THAN 2 TIMES DAILY   glyBURIDE 2.5 MG tablet Commonly known as:  DIABETA Take 1 tablet (2.5 mg total) by mouth 2 (two) times daily with a meal.   hydrocortisone 2.5 % cream Apply topically 2 (two) times daily.   metFORMIN 1000 MG tablet Commonly known as:  GLUCOPHAGE TAKE 1 TABLET BY MOUTH WITH BREAKFAST, 1 TABLET WITH LUNCH AND 1/2 TABLET WITH DINNER   metroNIDAZOLE 500 MG tablet Commonly known as:  FLAGYL Take 1 tablet (500 mg total) by mouth 3 (three) times daily.   onetouch ultrasoft lancets CHECK BLOOD SUGAR NO MORE THAN 2 TIMES DAILY           Objective:   Physical Exam LMP 04/17/1988  This is phone visit.  She was in no distress.     Assessment     Assessment  DM HTN -Losartan d/c d/t hyperkalemia -Amlodipine d/c d/t edema 07-2017 - HCTZ d/c d/t hyperkalemia 08/2017 Hyperlipidemia -- intolerant to lipitor and crestor (severe cramps).  Declined further trial with medications  on 01/16/2018   Anxiety- xanax rx by pcp Vertigo - valium  prn GI: --Recurrent diverticulitis (one episode documented by a CT 11-2014) --colonoscopy 10-2013, Dr. Marina Goodell, + polyps, melanosis coli, tics --H. pylori 2001 DJD--back pain, knees, neck pain ( on baclofen for neck) Osteoporosis : Tscore -1.9 on 11-2015 SCC, skin cancer Elevated homocysteine Raynaud's phenomena  NEURO --Carotid US 10-2013: No significant plaque, although ICA's are serpentine, bilaterally. 40-59% bilateral ICA stenosis by velocity criteria likely due to tortuosity. Patent vertebral arteries with antegrade flow.Normal subclavian arteries, bilaterally. -- Transinet dizziness, diplopia sx late 2016 , saw neuro >>> Dx w/ TIA/Stroke on clinical grounds  W/u:  Brain  MRI 03-2015 >> 2 mm saccular aneurysm at the right posterior communicating artery origin, saw  Neurology, Rx to recheck MRI 1 year, ok continue ASA per neuro CTA head, neck 05-14-2015: aneurysm confirmed, otherwise (-) --brain aneurysm see above, saw neuro 2018, consider repeat imagine 2020 --Admitted 07-2015, dizziness, MRI negative, DX labyrinthitis   PLAN: Diverticulitis: Under normal circumstances, I would bring the into the office for an assessment but that is now risky. Her symptoms are quite suggestive and consistent with her history of previous diverticulitis Plan: Start Cipro and Flagyl for 1 week, good hydration, call if not gradually better, ER if symptoms increase. Coronavirus pandemia: She is following all the CDC recommendations regards hygiene and social distancing.  Patient is praised   I discussed the assessment and treatment plan with the patient. The patient was provided an opportunity to ask questions  and all were answered. The patient agreed with the plan and demonstrated an understanding of the instructions.   The patient was advised to call back or seek an in-person evaluation if the symptoms worsen or if the condition fails to improve as anticipated.  I provided 13 minutes of non-face-to-face time during this encounter.

## 2018-07-19 NOTE — Telephone Encounter (Signed)
Virtual visit scheduled.  

## 2018-07-19 NOTE — Telephone Encounter (Signed)
I returned her call.    She is c/o left lower abd pain that started yesterday and is a little worse today.   The day before yesterday she felt some discomfort but not much. She has diverticulitis.   She thinks this may be flaring up from eatig strawberries  I let her know due to the coronavirus pandemic that Dr. Larose Kells would contact her via phone.   She is requesting a phone call visit instead of a video chat.  She is Not sure how to do that.    The best number is [phone].  I let her know someone would be calling her.  She was agreeable to this plan.  Reason for Disposition . Age > 60 years    She has diverticulitis.  Answer Assessment - Initial Assessment Questions 1. LOCATION: "Where does it hurt?"      Started yesterday with pain in my left lower abd.   It's worse today. 2. RADIATION: "Does the pain shoot anywhere else?" (e.g., chest, back)     No 3. ONSET: "When did the pain begin?" (e.g., minutes, hours or days ago)      Yesterday   I felt some discomfort the day before yesterday but today it's worse.    I have diverticulitis. 4. SUDDEN: "Gradual or sudden onset?"     Gradually. 5. PATTERN "Does the pain come and go, or is it constant?"    - If constant: "Is it getting better, staying the same, or worsening?"      (Note: Constant means the pain never goes away completely; most serious pain is constant and it progresses)     - If intermittent: "How long does it last?" "Do you have pain now?"     (Note: Intermittent means the pain goes away completely between bouts)     It hurts all the time. 6. SEVERITY: "How bad is the pain?"  (e.g., Scale 1-10; mild, moderate, or severe)   - MILD (1-3): doesn't interfere with normal activities, abdomen soft and not tender to touch    - MODERATE (4-7): interferes with normal activities or awakens from sleep, tender to touch    - SEVERE (8-10): excruciating pain, doubled over, unable to do any normal activities      Between 4 and 5 on pain scale.    7. RECURRENT SYMPTOM: "Have you ever had this type of abdominal pain before?" If so, ask: "When was the last time?" and "What happened that time?"      Yes.  Last happened about 11 months ago.  I have diverticulitis. 8. CAUSE: "What do you think is causing the abdominal pain?"     The diverticulitis. 9. RELIEVING/AGGRAVATING FACTORS: "What makes it better or worse?" (e.g., movement, antacids, bowel movement)     I ate the strawberries that I'm not supposed to eat so I'm sure that's what has me hurting. 10. OTHER SYMPTOMS: "Has there been any vomiting, diarrhea, constipation, or urine problems?"       No constipation or diarrhea.   No vomiting. 11. PREGNANCY: "Is there any chance you are pregnant?" "When was your last menstrual period?"       Not asked due to age  Protocols used: ABDOMINAL PAIN - [facility]

## 2018-07-22 NOTE — Assessment & Plan Note (Signed)
Diverticulitis: Under normal circumstances, I would bring the into the office for an assessment but that is now risky. Her symptoms are quite suggestive and consistent with her history of previous diverticulitis Plan: Start Cipro and Flagyl for 1 week, good hydration, call if not gradually better, ER if symptoms increase. Coronavirus pandemia: She is following all the CDC recommendations regards hygiene and social distancing.  Patient is praised

## 2018-07-29 ENCOUNTER — Encounter: Payer: Self-pay | Admitting: Internal Medicine

## 2018-08-01 ENCOUNTER — Telehealth: Payer: Self-pay | Admitting: Internal Medicine

## 2018-08-02 NOTE — Telephone Encounter (Signed)
Pt is requesting refill on alprazolam.   Last OV: 07/19/2018 Last Fill: 04/02/2018 #30 and 1RF UDS: 04/02/2018 Low risk

## 2018-08-02 NOTE — Telephone Encounter (Signed)
Sent!

## 2018-08-05 ENCOUNTER — Ambulatory Visit: Payer: Medicare Other | Admitting: Cardiology

## 2018-09-03 ENCOUNTER — Other Ambulatory Visit: Payer: Self-pay | Admitting: Internal Medicine

## 2018-09-25 ENCOUNTER — Other Ambulatory Visit: Payer: Self-pay | Admitting: Internal Medicine

## 2018-10-14 ENCOUNTER — Telehealth (HOSPITAL_COMMUNITY): Payer: Self-pay

## 2018-10-14 NOTE — Telephone Encounter (Signed)

## 2018-10-15 ENCOUNTER — Ambulatory Visit (HOSPITAL_COMMUNITY): Payer: Medicare Other | Attending: Cardiology

## 2018-10-15 ENCOUNTER — Other Ambulatory Visit: Payer: Self-pay

## 2018-10-15 ENCOUNTER — Telehealth: Payer: Self-pay | Admitting: *Deleted

## 2018-10-15 DIAGNOSIS — I340 Nonrheumatic mitral (valve) insufficiency: Secondary | ICD-10-CM | POA: Insufficient documentation

## 2018-10-15 NOTE — Telephone Encounter (Signed)
A message was left,re:follow visit. 

## 2018-10-23 ENCOUNTER — Other Ambulatory Visit: Payer: Self-pay | Admitting: Internal Medicine

## 2018-11-07 ENCOUNTER — Ambulatory Visit (HOSPITAL_COMMUNITY)
Admission: RE | Admit: 2018-11-07 | Discharge: 2018-11-07 | Disposition: A | Discharge status: Home / Self Care | Payer: Medicare Other | Source: Ambulatory Visit | Attending: Cardiology | Admitting: Cardiology

## 2018-11-07 ENCOUNTER — Other Ambulatory Visit: Payer: Self-pay

## 2018-11-07 DIAGNOSIS — R55 Syncope and collapse: Secondary | ICD-10-CM | POA: Diagnosis not present

## 2018-11-08 ENCOUNTER — Telehealth: Payer: Self-pay | Admitting: Internal Medicine

## 2018-11-08 NOTE — Telephone Encounter (Signed)
Sent!

## 2018-11-08 NOTE — Telephone Encounter (Signed)
Alprazolam refill.   Last OV; 07/19/2018 Last Fill: 08/02/2018 #30 and 2RF UDS: 04/02/2018 Low risk

## 2018-11-14 ENCOUNTER — Telehealth: Payer: Self-pay | Admitting: *Deleted

## 2018-11-14 NOTE — Telephone Encounter (Signed)
Advised patient

## 2018-11-14 NOTE — Telephone Encounter (Signed)
Follow up ° ° °Patient is returning your call. Please call. ° ° ° °

## 2018-11-14 NOTE — Telephone Encounter (Signed)
Left message to call back  

## 2018-11-14 NOTE — Telephone Encounter (Signed)
-----   Message from Minus Breeding, MD sent at 11/10/2018  3:45 PM EDT ----- No stenosis. No change in therapy.  No further imaging.  Call Ms. Glace with the results and send results to Colon Branch, MD

## 2018-11-15 DIAGNOSIS — E119 Type 2 diabetes mellitus without complications: Secondary | ICD-10-CM | POA: Diagnosis not present

## 2018-11-15 DIAGNOSIS — H532 Diplopia: Secondary | ICD-10-CM | POA: Diagnosis not present

## 2018-11-15 DIAGNOSIS — H527 Unspecified disorder of refraction: Secondary | ICD-10-CM | POA: Diagnosis not present

## 2018-11-15 DIAGNOSIS — H26493 Other secondary cataract, bilateral: Secondary | ICD-10-CM | POA: Diagnosis not present

## 2018-11-15 DIAGNOSIS — H401131 Primary open-angle glaucoma, bilateral, mild stage: Secondary | ICD-10-CM | POA: Diagnosis not present

## 2018-11-15 DIAGNOSIS — S0231XA Fracture of orbital floor, right side, initial encounter for closed fracture: Secondary | ICD-10-CM | POA: Diagnosis not present

## 2018-11-15 LAB — HM DIABETES EYE EXAM

## 2018-11-26 ENCOUNTER — Ambulatory Visit: Payer: Medicare Other | Admitting: Internal Medicine
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
## 2018-11-28 ENCOUNTER — Telehealth: Payer: Medicare Other | Admitting: Cardiology

## 2018-11-28 NOTE — Progress Notes (Signed)
Cardiology Office Note   Date:  11/29/2018   ID:  Tanishi, Lambo 06/16/1933, MRN [id number]  PCP:  Wanda Plump, MD  Cardiologist:   Rollene Rotunda, MD   No chief complaint on file.     History of Present Illness: Patricia Moore is a 85 y.o. female who presents for follow up of a syncope.   This happened on February 17.  She woke up and she felt dizzy.  She has been treated with meclizine.   She has been treated for labyrinthitis as well.  She had a normal echo.  She did have monitor in 2018 that demonstrated NSVT.   She came to the office yesterday and she was complaining of neck pain and arm pain.    She does show me a picture of swelling which is been intermittent.  There is no edema today.  She has occasional chest discomfort.  This is midsternal.  It seems to be sporadic.  It does not happen with exertion.  There is no radiation.  It somewhat sharp.  Last only few minutes at a time.  There is no associated nausea vomiting diaphoresis.  Not describing any new palpitations.  She has not had any of the syncope that was bothering her previously.  She complains of occasional pain in her legs.  This seems to be more anterior or pretibial rather than calf tenderness.   Past Medical History:  Diagnosis Date  . Allergy   . Anxiety 12/09/2013  . Arthritis   . Diabetes mellitus   . Diverticulitis    patient states has had three attacks   . Diverticulosis   . DJD (degenerative joint disease) 12/14/2014  . H. pylori infection 2001  . Hx of adenomatous colonic polyps   . Hyperlipidemia   . Hypertension   . Intraductal papilloma    bx nef x 2 in the 70s-90s  . Osteoporosis   . Saccular aneurysm   . SCC (squamous cell carcinoma) 03/2011   sees dermatology    Past Surgical History:  Procedure Laterality Date  . BREAST BIOPSY  '91, '95   INTRADUCTAL PAPILLOMA  . BREAST LUMPECTOMY    . CATARACT EXTRACTION Bilateral 09/2014  . CLOSED REDUCTION ULNAR SHAFT Right 10/06/2017   Procedure: CLOSED REDUCTION DISLOCATED RIGHT SHOULDER;  Surgeon: Kathryne Hitch, MD;  Location: [facility];  Service: Orthopedics;  Laterality: Right;     Current Outpatient Medications  Medication Sig Dispense Refill  . ALPRAZolam (XANAX) 0.25 MG tablet TAKE 1 TABLET(0.25 MG) BY MOUTH DAILY AS NEEDED 30 tablet 2  . aspirin 81 MG tablet Take 81 mg by mouth daily.    . carvedilol (COREG) 6.25 MG tablet Take 1 tablet (6.25 mg total) by mouth 2 (two) times daily with a meal. 180 tablet 1  . cholecalciferol (VITAMIN D) 1000 UNITS tablet Take 1,000 Units by mouth daily.    Marland Kitchen co-enzyme Q-10 30 MG capsule Take 30 mg by mouth daily.    Marland Kitchen docusate sodium (COLACE) 100 MG capsule Take 1 capsule (100 mg total) by mouth 2 (two) times daily. 10 capsule 0  . dorzolamide-timolol (COSOPT) 22.3-6.8 MG/ML ophthalmic solution Place 1 drop into both eyes 2 (two) times daily.     . folic acid (FOLVITE) 1 MG tablet Take 1 tablet (1 mg total) by mouth daily. 90 tablet 3  . glucose blood (ONE TOUCH ULTRA TEST) test strip CHECK BLOOD SUGAR NO MORE THAN 2 TIMES DAILY 200 each 12  .  glyBURIDE (DIABETA) 2.5 MG tablet Take 1 tablet (2.5 mg total) by mouth 2 (two) times daily with a meal. 60 tablet 5  . hydrocortisone 2.5 % cream Apply topically 2 (two) times daily. 30 g 0  . Lancets (ONETOUCH ULTRASOFT) lancets CHECK BLOOD SUGAR NO MORE THAN 2 TIMES DAILY 100 each 12  . metFORMIN (GLUCOPHAGE) 1000 MG tablet TAKE 1 TABLET BY MOUTH WITH BREAKFAST, 1 TABLET WITH LUNCH AND 1/2 TABLET WITH DINNER 75 tablet 5  . metroNIDAZOLE (FLAGYL) 500 MG tablet Take 1 tablet (500 mg total) by mouth 3 (three) times daily. 21 tablet 0  . ciprofloxacin (CIPRO) 500 MG tablet Take 1 tablet (500 mg total) by mouth 2 (two) times daily. (Patient not taking: Reported on 11/29/2018) 14 tablet 0  . diazepam (VALIUM) 5 MG tablet Take 5 mg by mouth 4 (four) times daily as needed (dizziness).     No current facility-administered medications for this  visit.     Allergies:   Patient has no known allergies.    ROS:  Please see the history of present illness.   Otherwise, review of systems are positive for none.   All other systems are reviewed and negative.    PHYSICAL EXAM: VS:  BP (!) 154/71   Pulse 73   Temp (!) 96 F (35.6 C)   Ht 5\' 1"  (1.549 m)   Wt 146 lb 6.4 oz (66.4 kg)   LMP 04/17/1988   SpO2 98%   BMI 27.66 kg/m  , BMI Body mass index is 27.66 kg/m. GENERAL:  Well appearing NECK:  No jugular venous distention, waveform within normal limits, carotid upstroke brisk and symmetric, no bruits, no thyromegaly LUNGS:  Clear to auscultation bilaterally CHEST:  Unremarkable HEART:  PMI not displaced or sustained,S1 and S2 within normal limits, no S3, no S4, no clicks, no rubs, no murmurs ABD:  Flat, positive bowel sounds normal in frequency in pitch, no bruits, no rebound, no guarding, no midline pulsatile mass, no hepatomegaly, no splenomegaly EXT:  2 plus pulses throughout, no edema, no cyanosis no clubbing   EKG:  EKG is ordered today. The ekg ordered today demonstrates sinus rhythm, rate 72, axis within normal limits, intervals within normal limits, no acute ST-T wave changes.   Recent Labs: 05/28/2018: ALT 15; BUN 21; Creatinine, Ser 0.88; Hemoglobin 14.2; Platelets 343.0; Potassium 4.7; Sodium 137    Lipid Panel    Component Value Date/Time   CHOL 171 12/26/2016 1253   TRIG 112.0 12/26/2016 1253   HDL 62.00 12/26/2016 1253   CHOLHDL 3 12/26/2016 1253   VLDL 22.4 12/26/2016 1253   LDLCALC 87 12/26/2016 1253   LDLDIRECT 131.9 10/28/2010 1103      Wt Readings from Last 3 Encounters:  11/29/18 146 lb 6.4 oz (66.4 kg)  07/18/18 150 lb (68 kg)  06/07/18 146 lb (66.2 kg)      Other studies Reviewed: Additional studies/ records that were reviewed today include: None. Review of the above records demonstrates:  Please see elsewhere in the note.     ASSESSMENT AND PLAN:    CHEST PAIN: Getting up with  the pain is atypical.  However, she is had a slightly abnormal EKG though she has a normal EKG now.  I am going to screen her with a POET (Plain Old Exercise Treadmill)  SYNCOPE:   She is had no further events.  She had normal carotid Doppler.  No change in therapy.  No further work-up.  She will let  me know if she has further episodes.  HTN: She is going to keep a blood pressure diary.  Her blood pressure is slightly elevated.  I will probably add hydrochlorothiazide in the future.  She had hyperkalemia with an ARB.   Current medicines are reviewed at length with the patient today.  The patient does not have concerns and correct regarding medicines.  The following changes have been made: No change  Labs/ tests ordered today include:  No orders of the defined types were placed in this encounter.    Disposition:   FU with me     Signed, Rollene Rotunda, MD  11/29/2018 10:37 AM    [facility]

## 2018-11-29 ENCOUNTER — Ambulatory Visit: Payer: Medicare Other | Admitting: Internal Medicine

## 2018-11-29 ENCOUNTER — Encounter: Payer: Self-pay | Admitting: Cardiology

## 2018-11-29 ENCOUNTER — Ambulatory Visit (INDEPENDENT_AMBULATORY_CARE_PROVIDER_SITE_OTHER): Payer: Medicare Other | Admitting: Cardiology

## 2018-11-29 ENCOUNTER — Other Ambulatory Visit: Payer: Self-pay

## 2018-11-29 VITALS — BP 154/71 | HR 73 | Temp 96.0°F | Ht 61.0 in | Wt 146.4 lb

## 2018-11-29 DIAGNOSIS — R0789 Other chest pain: Secondary | ICD-10-CM | POA: Diagnosis not present

## 2018-11-29 DIAGNOSIS — Z01812 Encounter for preprocedural laboratory examination: Secondary | ICD-10-CM

## 2018-11-29 NOTE — Patient Instructions (Addendum)
Medication Instructions:  Your physician recommends that you continue on your current medications as directed. Please refer to the Current Medication list given to you today.  If you need a refill on your cardiac medications before your next appointment, please call your pharmacy.   Lab work: COVID 19 SCREENING TEST 3 DAYS PRIOR TO YOUR STRESS TEST THIS WILL BE DONE AT THE [facility] ([place])  If you have labs (blood work) drawn today and your tests are completely normal, you will receive your results only by: Marland Kitchen MyChart Message (if you have MyChart) OR . A paper copy in the mail If you have any lab test that is abnormal or we need to change your treatment, we will call you to review the results.  Testing/Procedures: Your physician has requested that you have an exercise tolerance test. For further information please visit [email or web address]. Please also follow instruction sheet, as given. ON A DAY DR HOCHREIN'S IN OFFICE  HOLD CARVEDILOL MORNING OF TEST   Follow-Up: At [facility], you and your health needs are our priority.  As part of our continuing mission to provide you with exceptional heart care, we have created designated Provider Care Teams.  These Care Teams include your primary Cardiologist (physician) and Advanced Practice Providers (APPs -  Physician Assistants and Nurse Practitioners) who all work together to provide you with the care you need, when you need it. You will need a follow up appointment in 12 months.  Please call our office 2 months in advance to schedule this appointment.  You may see Minus Breeding, MD or one of the following Advanced Practice Providers on your designated Care Team:   Rosaria Ferries, PA-C . Jory Sims, DNP, ANP  Any Other Special Instructions Will Be Listed Below (If Applicable).  MONITOR AND LOG YOUR BLOOD PRESSURE  BRING YOUR LOG AND BLOOD PRESSURE MACHINE WHEN YOU COME FOR YOUR STRESS TEST    Exercise  Stress Test An exercise stress test is a test to check how your heart works during exercise. You will need to walk on a treadmill or ride an exercise bike for this test. An electrocardiogram (ECG) will record your heartbeat when you are at rest and when you are exercising. You may have an ultrasound or nuclear test after the exercise test. The test is done to check for coronary artery disease (CAD). It is also done to:  See how well you can exercise.  Watch for high blood pressure during exercise.  Test how well you can exercise after treatment.  Check the blood flow to your arms and legs. If your test result is not normal, more testing may be needed. What happens before the procedure?  Follow instructions from your doctor about what you cannot eat or drink. ? Do not have any drinks or foods that have caffeine in them for 24 hours before the test, or as told by your doctor. This includes coffee, tea (even decaf tea), sodas, chocolate, and cocoa.  Ask your doctor about changing or stopping your normal medicines. This is important if you: ? Take diabetes medicines. ? Take beta-blocker medicines. ? Wear a nitroglycerin patch.  If you use an inhaler, bring it with you to the test.  Do not put lotions, powders, creams, or oils on your chest before the test.  Wear comfortable shoes and clothing.  Do not use any products that have nicotine or tobacco in them, such as cigarettes and e-cigarettes. Stop using them at least 4 hours  before the test. If you need help quitting, ask your doctor. What happens during the procedure?   Patches (electrodes) will be put on your chest.  Wires will be connected to the patches. The wires will send signals to a machine to record your heartbeat.  Your heart rate will be watched while you are resting and while you are exercising. Your blood pressure will also be watched during the test.  You will walk on a treadmill or use a stationary bike. If you cannot  use these, you may be asked to turn a crank with your hands.  The activity will get harder and will raise your heart rate.  You may be asked to breathe into a tube a few times during the test. This measures the gases that you breathe out.  You will be asked how you are feeling throughout the test.  You will exercise until your heart reaches a target heart rate. You will stop early if: ? You feel dizzy. ? You have chest pain. ? You are out of breath. ? Your blood pressure is too high or too low. ? You have an irregular heartbeat. ? You have pain or aching in your arms or legs. The procedure may vary among doctors and hospitals. What happens after the procedure?  Your blood pressure, heart rate, breathing rate, and blood oxygen level will be watched after the test.  You may return to your normal diet and activities as told by your doctor.  It is up to you to get the results of your test. Ask your doctor, or the department that is doing the test, when your results will be ready. Summary  An exercise stress test is a test to check how your heart works during exercise.  This test is done to check for coronary artery disease.  Your heart rate will be watched while you are resting and while you are exercising.  Follow instructions from your doctor about what you cannot eat or drink before the test. This information is not intended to replace advice given to you by your health care provider. Make sure you discuss any questions you have with your health care provider. Document Released: 09/20/2007 Document Revised: 07/16/2018 Document Reviewed: 07/04/2016 Elsevier Patient Education  2020 [employer].

## 2018-12-04 ENCOUNTER — Encounter: Payer: Self-pay | Admitting: Internal Medicine

## 2018-12-05 ENCOUNTER — Telehealth (HOSPITAL_COMMUNITY): Payer: Self-pay

## 2018-12-05 NOTE — Telephone Encounter (Signed)
Encounter complete. 

## 2018-12-06 ENCOUNTER — Inpatient Hospital Stay (HOSPITAL_COMMUNITY): Admission: RE | Admit: 2018-12-06 | Payer: Medicare Other | Source: Ambulatory Visit

## 2018-12-06 ENCOUNTER — Other Ambulatory Visit: Payer: Self-pay

## 2018-12-06 DIAGNOSIS — Z20822 Contact with and (suspected) exposure to covid-19: Secondary | ICD-10-CM

## 2018-12-07 LAB — NOVEL CORONAVIRUS, NAA: SARS-CoV-2, NAA: NOT DETECTED

## 2018-12-10 ENCOUNTER — Other Ambulatory Visit: Payer: Self-pay

## 2018-12-10 ENCOUNTER — Ambulatory Visit (HOSPITAL_COMMUNITY)
Admission: RE | Admit: 2018-12-10 | Discharge: 2018-12-10 | Disposition: A | Discharge status: Home / Self Care | Payer: Medicare Other | Source: Ambulatory Visit | Attending: Internal Medicine | Admitting: Internal Medicine

## 2018-12-10 DIAGNOSIS — R0789 Other chest pain: Secondary | ICD-10-CM | POA: Diagnosis not present

## 2018-12-11 LAB — EXERCISE TOLERANCE TEST
Estimated workload: 8.2 METS
Exercise duration (min): 6 min
Exercise duration (sec): 49 s
MPHR: 138 {beats}/min
Peak HR: 137 {beats}/min
Percent HR: 99 %
RPE: 17
Rest HR: 67 {beats}/min

## 2018-12-12 DIAGNOSIS — D2261 Melanocytic nevi of right upper limb, including shoulder: Secondary | ICD-10-CM | POA: Diagnosis not present

## 2018-12-12 DIAGNOSIS — D225 Melanocytic nevi of trunk: Secondary | ICD-10-CM | POA: Diagnosis not present

## 2018-12-12 DIAGNOSIS — D2272 Melanocytic nevi of left lower limb, including hip: Secondary | ICD-10-CM | POA: Diagnosis not present

## 2018-12-12 DIAGNOSIS — M6740 Ganglion, unspecified site: Secondary | ICD-10-CM | POA: Diagnosis not present

## 2018-12-12 DIAGNOSIS — Z85828 Personal history of other malignant neoplasm of skin: Secondary | ICD-10-CM | POA: Diagnosis not present

## 2018-12-13 ENCOUNTER — Other Ambulatory Visit (HOSPITAL_BASED_OUTPATIENT_CLINIC_OR_DEPARTMENT_OTHER): Payer: Self-pay | Admitting: Emergency Medicine

## 2018-12-13 ENCOUNTER — Encounter: Payer: Self-pay | Admitting: Internal Medicine

## 2018-12-13 ENCOUNTER — Ambulatory Visit (INDEPENDENT_AMBULATORY_CARE_PROVIDER_SITE_OTHER): Payer: Medicare Other | Admitting: Internal Medicine

## 2018-12-13 ENCOUNTER — Other Ambulatory Visit: Payer: Self-pay

## 2018-12-13 VITALS — BP 143/53 | HR 72 | Temp 97.7°F | Resp 16 | Ht 61.0 in | Wt 145.1 lb

## 2018-12-13 DIAGNOSIS — E119 Type 2 diabetes mellitus without complications: Secondary | ICD-10-CM | POA: Diagnosis not present

## 2018-12-13 DIAGNOSIS — M810 Age-related osteoporosis without current pathological fracture: Secondary | ICD-10-CM | POA: Diagnosis not present

## 2018-12-13 DIAGNOSIS — E785 Hyperlipidemia, unspecified: Secondary | ICD-10-CM

## 2018-12-13 DIAGNOSIS — Z01419 Encounter for gynecological examination (general) (routine) without abnormal findings: Secondary | ICD-10-CM

## 2018-12-13 DIAGNOSIS — I10 Essential (primary) hypertension: Secondary | ICD-10-CM

## 2018-12-13 DIAGNOSIS — Z79899 Other long term (current) drug therapy: Secondary | ICD-10-CM

## 2018-12-13 DIAGNOSIS — F419 Anxiety disorder, unspecified: Secondary | ICD-10-CM

## 2018-12-13 DIAGNOSIS — Z23 Encounter for immunization: Secondary | ICD-10-CM | POA: Diagnosis not present

## 2018-12-13 LAB — MICROALBUMIN / CREATININE URINE RATIO
Creatinine,U: 38.8 mg/dL
Microalb Creat Ratio: 1.8 mg/g (ref 0.0–30.0)
Microalb, Ur: <0.7 mg/dL (ref 0.0–1.9)

## 2018-12-13 LAB — BASIC METABOLIC PANEL
BUN: 20 mg/dL (ref 6–23)
CO2: 30 mEq/L (ref 19–32)
Calcium: 9.9 mg/dL (ref 8.4–10.5)
Chloride: 100 mEq/L (ref 96–112)
Creatinine, Ser: 0.99 mg/dL (ref 0.40–1.20)
GFR: 53.58 mL/min — ABNORMAL LOW (ref >60.00)
Glucose, Bld: 150 mg/dL — ABNORMAL HIGH (ref 70–99)
Potassium: 4.9 mEq/L (ref 3.5–5.1)
Sodium: 137 mEq/L (ref 135–145)

## 2018-12-13 LAB — HEMOGLOBIN A1C: Hgb A1c MFr Bld: 6.7 % — ABNORMAL HIGH (ref 4.6–6.5)

## 2018-12-13 NOTE — Progress Notes (Signed)
Pre visit review using our clinic review tool, if applicable. No additional management support is needed unless otherwise documented below in the visit note. 
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
## 2018-12-13 NOTE — Patient Instructions (Addendum)
Please schedule Medicare Wellness with Glenard Haring.    GO TO THE LAB : Get the blood work     GO TO THE FRONT DESK Schedule your next appointment   for physical exam by 05-2019   STOP BY THE FIRST FLOOR: Schedule a bone density test  Decrease glyburide 2.5 mg to 1 tablet in the morning with breakfast   Check the  blood pressure weekly BP GOAL is between 110/65 and  135/85. If it is consistently higher or lower, let me know
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
## 2018-12-15 LAB — PAIN MGMT, PROFILE 8 W/CONF, U
6 Acetylmorphine: NEGATIVE ng/mL
Alcohol Metabolites: NEGATIVE ng/mL (ref <500)
Alphahydroxyalprazolam: NEGATIVE ng/mL
Alphahydroxymidazolam: NEGATIVE ng/mL
Alphahydroxytriazolam: NEGATIVE ng/mL
Aminoclonazepam: NEGATIVE ng/mL
Amphetamines: NEGATIVE ng/mL
Benzodiazepines: POSITIVE ng/mL
Buprenorphine, Urine: NEGATIVE ng/mL
Cocaine Metabolite: NEGATIVE ng/mL
Creatinine: 35 mg/dL
Hydroxyethylflurazepam: NEGATIVE ng/mL
Lorazepam: NEGATIVE ng/mL
MDMA: NEGATIVE ng/mL
Marijuana Metabolite: NEGATIVE ng/mL
Nordiazepam: NEGATIVE ng/mL
Opiates: NEGATIVE ng/mL
Oxazepam: NEGATIVE ng/mL
Oxidant: NEGATIVE ug/mL
Oxycodone: NEGATIVE ng/mL
Temazepam: 65 ng/mL
pH: 7.2 (ref 4.5–9.0)

## 2019-01-13 ENCOUNTER — Ambulatory Visit (INDEPENDENT_AMBULATORY_CARE_PROVIDER_SITE_OTHER): Payer: Medicare Other | Admitting: Internal Medicine

## 2019-01-13 ENCOUNTER — Encounter: Payer: Self-pay | Admitting: Internal Medicine

## 2019-01-13 ENCOUNTER — Other Ambulatory Visit: Payer: Self-pay

## 2019-01-13 VITALS — BP 162/58 | HR 68 | Temp 98.6°F | Resp 16 | Ht 61.0 in | Wt 142.1 lb

## 2019-01-13 DIAGNOSIS — I10 Essential (primary) hypertension: Secondary | ICD-10-CM

## 2019-01-13 DIAGNOSIS — R1032 Left lower quadrant pain: Secondary | ICD-10-CM

## 2019-01-13 DIAGNOSIS — M549 Dorsalgia, unspecified: Secondary | ICD-10-CM | POA: Diagnosis not present

## 2019-01-13 LAB — POC URINALSYSI DIPSTICK (AUTOMATED)
Bilirubin, UA: NEGATIVE
Blood, UA: NEGATIVE
Glucose, UA: NEGATIVE
Ketones, UA: NEGATIVE
Leukocytes, UA: NEGATIVE
Nitrite, UA: NEGATIVE
Protein, UA: NEGATIVE
Spec Grav, UA: 1.01 (ref 1.010–1.025)
Urobilinogen, UA: 0.2 E.U./dL
pH, UA: 6.5 (ref 5.0–8.0)

## 2019-01-13 NOTE — Progress Notes (Signed)
Pre visit review using our clinic review tool, if applicable. No additional management support is needed unless otherwise documented below in the visit note. 

## 2019-01-13 NOTE — Patient Instructions (Addendum)
Drink plenty fluids  Take Milk of Magnesium for few days  Okay to use glycerin suppository as needed  Call if severe pain, fever, chills, blood in the stools.
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
## 2019-01-16 ENCOUNTER — Ambulatory Visit: Payer: Self-pay | Admitting: Internal Medicine

## 2019-01-16 MED ORDER — CIPROFLOXACIN HCL 500 MG PO TABS: 500.0000 mg | ORAL_TABLET | Freq: Two times a day (BID) | ORAL | 0 refills | Status: DC

## 2019-01-16 MED ORDER — METRONIDAZOLE 500 MG PO TABS: 500.0000 mg | ORAL_TABLET | Freq: Three times a day (TID) | ORAL | 0 refills | Status: DC

## 2019-01-16 NOTE — Telephone Encounter (Signed)
Please advise 
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
## 2019-01-16 NOTE — Telephone Encounter (Signed)
Pt calling to report worsening abdominal pain. Saw Dr. Larose Kells Monday for similar symptoms. Pt reluctant to speak with triage. States ,"I just need my usual medication for this diverticulosis,I've had for years. I don't know why I have to wait." Declines appt, "Just need my medication." Reports LLQ pain worsening since last night "  "Awake all night."  Pt states takes Cipro and Flagyl. Requesting to speak to Dr. Larose Kells directly, informed Dr. Larose Kells is seeing patients at this time; assured pt [place] would route to practice for Dr. Ethel Rana review. Advised ED if symptoms worsen.  Please advise: [phone]  Assisted by interpreter [facility] # [phone]  Reason for Disposition . Age > 60 years  Answer Assessment - Initial Assessment Questions 1. LOCATION: "Where does it hurt?"      LLQ 2. RADIATION: "Does the pain shoot anywhere else?" (e.g., chest, back)     *No Answer* 3. ONSET: "When did the pain begin?" (e.g., minutes, hours or days ago)      Saw Dr. Larose Kells Monday 4. SUDDEN: "Gradual or sudden onset?"     *No Answer* 5. PATTERN "Does the pain come and go, or is it constant?"    - If constant: "Is it getting better, staying the same, or worsening?"      (Note: Constant means the pain never goes away completely; most serious pain is constant and it progresses)     - If intermittent: "How long does it last?" "Do you have pain now?"     (Note: Intermittent means the pain goes away completely between bouts)     *No Answer* 6. SEVERITY: "How bad is the pain?"  (e.g., Scale 1-10; mild, moderate, or severe)   - MILD (1-3): doesn't interfere with normal activities, abdomen soft and not tender to touch    - MODERATE (4-7): interferes with normal activities or awakens from sleep, tender to touch    - SEVERE (8-10): excruciating pain, doubled over, unable to do any normal activities       7. RECURRENT SYMPTOM: "Have you ever had this type of abdominal pain before?" If so, ask: "When was the last time?" and "What happened  that time?"      Yes, diverticulosis. 8. CAUSE: "What do you think is causing the abdominal pain?"      9. RELIEVING/AGGRAVATING FACTORS: "What makes it better or worse?" (e.g., movement, antacids, bowel movement)      10. OTHER SYMPTOMS: "Has there been any vomiting, diarrhea, constipation, or urine problems?"     See OV notes  Protocols used: ABDOMINAL PAIN - [facility]

## 2019-01-16 NOTE — Telephone Encounter (Signed)
Rx's sent to [facility]. Spoke w/ Pt- informed that meds have been sent. Pt verbalized understanding.

## 2019-01-16 NOTE — Telephone Encounter (Signed)
Advise patient: Schedule abdomen pelvis CT, DX diverticulitis rule out Please to schedule before today.

## 2019-01-16 NOTE — Addendum Note (Signed)
Addended by: Damita Dunnings D on: 01/16/2019 12:50 PM   Modules accepted: Orders

## 2019-01-16 NOTE — Telephone Encounter (Signed)
Unfortunately, I am forced to rx antibiotics as a precaution.  I have previously discussed with the patient risks of unnecessary antibiotics. Please send the following: Cipro 500 mg twice daily #14 Flagyl 500 mg 3 times daily #21

## 2019-01-20 ENCOUNTER — Ambulatory Visit: Payer: Self-pay

## 2019-01-20 ENCOUNTER — Ambulatory Visit (INDEPENDENT_AMBULATORY_CARE_PROVIDER_SITE_OTHER): Payer: Medicare Other | Admitting: Orthopaedic Surgery

## 2019-01-20 ENCOUNTER — Encounter: Payer: Self-pay | Admitting: Orthopaedic Surgery

## 2019-01-20 ENCOUNTER — Other Ambulatory Visit: Payer: Self-pay

## 2019-01-20 DIAGNOSIS — R1011 Right upper quadrant pain: Secondary | ICD-10-CM

## 2019-01-20 DIAGNOSIS — R0781 Pleurodynia: Secondary | ICD-10-CM | POA: Diagnosis not present

## 2019-01-20 DIAGNOSIS — M549 Dorsalgia, unspecified: Secondary | ICD-10-CM | POA: Diagnosis not present
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
## 2019-02-21 ENCOUNTER — Ambulatory Visit (INDEPENDENT_AMBULATORY_CARE_PROVIDER_SITE_OTHER): Payer: Medicare Other | Admitting: Obstetrics & Gynecology

## 2019-02-21 ENCOUNTER — Other Ambulatory Visit: Payer: Self-pay

## 2019-02-21 ENCOUNTER — Encounter: Payer: Self-pay | Admitting: Obstetrics & Gynecology

## 2019-02-21 VITALS — BP 140/86 | Ht 61.0 in | Wt 142.0 lb

## 2019-02-21 DIAGNOSIS — M8589 Other specified disorders of bone density and structure, multiple sites: Secondary | ICD-10-CM

## 2019-02-21 DIAGNOSIS — Z780 Asymptomatic menopausal state: Secondary | ICD-10-CM

## 2019-02-21 DIAGNOSIS — Z01419 Encounter for gynecological examination (general) (routine) without abnormal findings: Secondary | ICD-10-CM | POA: Diagnosis not present

## 2019-02-21 NOTE — Progress Notes (Signed)
Patricia Moore 28-Dec-1933 [id number]   History:    85 y.o. G2P1A1L1 Married.  RP:  Established patient presenting for annual gyn exam   HPI: Postmenopause, well on no HRT.  No PMB.  No pelvic pain.  Abstinent.  Urine normal.  Constipation.  Breasts normal.  BMI 26.83.  Health labs with Fam MD.  Past medical history,surgical history, family history and social history were all reviewed and documented in the EPIC chart.  Gynecologic History Patient's last menstrual period was 04/17/1988. Contraception: abstinence and post menopausal status Last Pap: 04/2010. Results were: Negative Last mammogram: 10/2017. Results were: Normal per patient Bone Density: Bone Density 11/2015 Osteopenia.  Schedule repeat BD at [facility] now. Colonoscopy: 2015  Obstetric History OB History  Gravida Para Term Preterm AB Living  2 1     1 1   SAB TAB Ectopic Multiple Live Births  1            # Outcome Date GA Lbr Len/2nd Weight Sex Delivery Anes PTL Lv  2 SAB           1 Para              ROS: A ROS was performed and pertinent positives and negatives are included in the history.  GENERAL: No fevers or chills. HEENT: No change in vision, no earache, sore throat or sinus congestion. NECK: No pain or stiffness. CARDIOVASCULAR: No chest pain or pressure. No palpitations. PULMONARY: No shortness of breath, cough or wheeze. GASTROINTESTINAL: No abdominal pain, nausea, vomiting or diarrhea, melena or bright red blood per rectum. GENITOURINARY: No urinary frequency, urgency, hesitancy or dysuria. MUSCULOSKELETAL: No joint or muscle pain, no back pain, no recent trauma. DERMATOLOGIC: No rash, no itching, no lesions. ENDOCRINE: No polyuria, polydipsia, no heat or cold intolerance. No recent change in weight. HEMATOLOGICAL: No anemia or easy bruising or bleeding. NEUROLOGIC: No headache, seizures, numbness, tingling or weakness. PSYCHIATRIC: No depression, no loss of interest in normal activity or change in sleep pattern.     Exam:   BP 140/86   Ht 5\' 1"  (1.549 m)   Wt 142 lb (64.4 kg)   LMP 04/17/1988   BMI 26.83 kg/m   Body mass index is 26.83 kg/m.  General appearance : Well developed well nourished female. No acute distress HEENT: Eyes: no retinal hemorrhage or exudates,  Neck supple, trachea midline, no carotid bruits, no thyroidmegaly Lungs: Clear to auscultation, no rhonchi or wheezes, or rib retractions  Heart: Regular rate and rhythm, no murmurs or gallops Breast:Examined in sitting and supine position were symmetrical in appearance, no palpable masses or tenderness,  no skin retraction, no nipple inversion, no nipple discharge, no skin discoloration, no axillary or supraclavicular lymphadenopathy Abdomen: no palpable masses or tenderness, no rebound or guarding Extremities: no edema or skin discoloration or tenderness  Pelvic: Vulva: Normal             Vagina: No gross lesions or discharge  Cervix: No gross lesions or discharge  Uterus  AV, normal size, shape and consistency, non-tender and mobile  Adnexa  Without masses or tenderness  Anus: Normal   Assessment/Plan:  85 y.o. female for annual exam   1. Well female exam with routine gynecological exam Normal gynecologic exam in menopause.  No indication for further Pap test.  Breast exam normal.  Will schedule a screening mammogram.  Health labs with family physician.  Colonoscopy 2015.  Body mass index 26.83.  Continue with regular walking and  healthy nutrition.  2. Postmenopause Well on no hormone replacement therapy.  No postmenopausal bleeding.  3. Osteopenia of multiple sites Will schedule a bone density at [facility] now.  Vitamin D supplements, calcium intake of 1200 mg daily and regular weightbearing physical activity is recommended.  [name] MD, 11:12 AM 02/21/2019

## 2019-02-22 ENCOUNTER — Encounter: Payer: Self-pay | Admitting: Obstetrics & Gynecology

## 2019-02-22 NOTE — Patient Instructions (Signed)
1. Well female exam with routine gynecological exam Normal gynecologic exam in menopause.  No indication for further Pap test.  Breast exam normal.  Will schedule a screening mammogram.  Health labs with family physician.  Colonoscopy 2015.  Body mass index 26.83.  Continue with regular walking and healthy nutrition.  2. Postmenopause Well on no hormone replacement therapy.  No postmenopausal bleeding.  3. Osteopenia of multiple sites Will schedule a bone density at [facility] now.  Vitamin D supplements, calcium intake of 1200 mg daily and regular weightbearing physical activity is recommended.  Patricia Moore, it was a pleasure seeing you today!

## 2019-02-26 ENCOUNTER — Other Ambulatory Visit: Payer: Self-pay | Admitting: *Deleted

## 2019-02-26 NOTE — Progress Notes (Signed)
Order sent to [facility] for Screening Mammo and bone density for osteopenia and post menopausal. Asked them to call pt to schedule KW CMA

## 2019-03-05 ENCOUNTER — Ambulatory Visit: Payer: Medicare Other | Admitting: Internal Medicine

## 2019-03-05 ENCOUNTER — Encounter: Payer: Self-pay | Admitting: Internal Medicine

## 2019-03-05 ENCOUNTER — Other Ambulatory Visit: Payer: Self-pay

## 2019-03-05 VITALS — BP 132/80 | HR 68 | Temp 98.3°F | Ht 61.0 in | Wt 145.4 lb

## 2019-03-05 DIAGNOSIS — Z86010 Personal history of colonic polyps: Secondary | ICD-10-CM

## 2019-03-05 DIAGNOSIS — K5790 Diverticulosis of intestine, part unspecified, without perforation or abscess without bleeding: Secondary | ICD-10-CM

## 2019-03-05 DIAGNOSIS — Z800 Family history of malignant neoplasm of digestive organs: Secondary | ICD-10-CM | POA: Diagnosis not present

## 2019-03-05 DIAGNOSIS — Z1159 Encounter for screening for other viral diseases: Secondary | ICD-10-CM | POA: Diagnosis not present

## 2019-03-05 DIAGNOSIS — K5900 Constipation, unspecified: Secondary | ICD-10-CM

## 2019-03-05 DIAGNOSIS — R109 Unspecified abdominal pain: Secondary | ICD-10-CM | POA: Diagnosis not present

## 2019-03-05 MED ORDER — NA SULFATE-K SULFATE-MG SULF 17.5-3.13-1.6 GM/177ML PO SOLN: 1.0000 | Freq: Once | ORAL | 0 refills | Status: AC

## 2019-03-05 NOTE — Patient Instructions (Signed)
You have been scheduled for a colonoscopy. Please follow written instructions given to you at your visit today.  Please pick up your prep supplies at the pharmacy within the next 1-3 days. If you use inhalers (even only as needed), please bring them with you on the day of your procedure.   
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
## 2019-03-05 NOTE — Progress Notes (Signed)
Spoke with [facility] today, they stated they have left the patient a message in Spanish to call and schedule the apts.  Sharrie Rothman CMA

## 2019-03-07 DIAGNOSIS — Z1231 Encounter for screening mammogram for malignant neoplasm of breast: Secondary | ICD-10-CM | POA: Diagnosis not present

## 2019-03-07 DIAGNOSIS — M8589 Other specified disorders of bone density and structure, multiple sites: Secondary | ICD-10-CM | POA: Diagnosis not present

## 2019-03-07 DIAGNOSIS — Z803 Family history of malignant neoplasm of breast: Secondary | ICD-10-CM | POA: Diagnosis not present

## 2019-03-07 LAB — HM MAMMOGRAPHY

## 2019-03-07 LAB — HM DEXA SCAN

## 2019-03-19 ENCOUNTER — Encounter: Payer: Self-pay | Admitting: Internal Medicine

## 2019-03-20 ENCOUNTER — Encounter: Payer: Self-pay | Admitting: Internal Medicine

## 2019-03-26 ENCOUNTER — Encounter: Payer: Self-pay | Admitting: Anesthesiology

## 2019-03-31 ENCOUNTER — Other Ambulatory Visit: Payer: Self-pay | Admitting: Internal Medicine

## 2019-03-31 ENCOUNTER — Ambulatory Visit (INDEPENDENT_AMBULATORY_CARE_PROVIDER_SITE_OTHER): Payer: Medicare Other

## 2019-03-31 DIAGNOSIS — Z1159 Encounter for screening for other viral diseases: Secondary | ICD-10-CM

## 2019-04-01 LAB — SARS CORONAVIRUS 2 (TAT 6-24 HRS): SARS Coronavirus 2: NEGATIVE

## 2019-04-02 ENCOUNTER — Ambulatory Visit (AMBULATORY_SURGERY_CENTER): Payer: Medicare Other | Admitting: Internal Medicine

## 2019-04-02 ENCOUNTER — Other Ambulatory Visit: Payer: Self-pay

## 2019-04-02 ENCOUNTER — Encounter: Payer: Self-pay | Admitting: Internal Medicine

## 2019-04-02 VITALS — BP 146/52 | HR 58 | Temp 97.7°F | Resp 18 | Ht 61.0 in | Wt 145.0 lb

## 2019-04-02 DIAGNOSIS — D120 Benign neoplasm of cecum: Secondary | ICD-10-CM

## 2019-04-02 DIAGNOSIS — D124 Benign neoplasm of descending colon: Secondary | ICD-10-CM | POA: Diagnosis not present

## 2019-04-02 DIAGNOSIS — K5790 Diverticulosis of intestine, part unspecified, without perforation or abscess without bleeding: Secondary | ICD-10-CM | POA: Diagnosis not present

## 2019-04-02 DIAGNOSIS — Z800 Family history of malignant neoplasm of digestive organs: Secondary | ICD-10-CM

## 2019-04-02 DIAGNOSIS — R109 Unspecified abdominal pain: Secondary | ICD-10-CM

## 2019-04-02 DIAGNOSIS — Z86010 Personal history of colonic polyps: Secondary | ICD-10-CM | POA: Diagnosis not present

## 2019-04-02 DIAGNOSIS — K5900 Constipation, unspecified: Secondary | ICD-10-CM

## 2019-04-02 MED ORDER — SODIUM CHLORIDE 0.9 % IV SOLN: 500.0000 mL | Freq: Once | INTRAVENOUS | Status: DC

## 2019-04-02 NOTE — Progress Notes (Signed)
Called to room to assist during endoscopic procedure.  Patient ID and intended procedure confirmed with present staff. Received instructions for my participation in the procedure from the performing physician.  

## 2019-04-02 NOTE — Progress Notes (Signed)
VS-CW Temp-LC

## 2019-04-02 NOTE — Progress Notes (Signed)
A/ox3, pleased with MAC, report to RN 

## 2019-04-02 NOTE — Patient Instructions (Signed)
Handout on polyps and diverticulosis   YOU HAD AN ENDOSCOPIC PROCEDURE TODAY AT [facility]:   Refer to the procedure report that was given to you for any specific questions about what was found during the examination.  If the procedure report does not answer your questions, please call your gastroenterologist to clarify.  If you requested that your care partner not be given the details of your procedure findings, then the procedure report has been included in a sealed envelope for you to review at your convenience later.  YOU SHOULD EXPECT: Some feelings of bloating in the abdomen. Passage of more gas than usual.  Walking can help get rid of the air that was put into your GI tract during the procedure and reduce the bloating. If you had a lower endoscopy (such as a colonoscopy or flexible sigmoidoscopy) you may notice spotting of blood in your stool or on the toilet paper. If you underwent a bowel prep for your procedure, you may not have a normal bowel movement for a few days.  Please Note:  You might notice some irritation and congestion in your nose or some drainage.  This is from the oxygen used during your procedure.  There is no need for concern and it should clear up in a day or so.  SYMPTOMS TO REPORT IMMEDIATELY:   Following lower endoscopy (colonoscopy or flexible sigmoidoscopy):  Excessive amounts of blood in the stool  Significant tenderness or worsening of abdominal pains  Swelling of the abdomen that is new, acute  Fever of 100F or higher   For urgent or emergent issues, a gastroenterologist can be reached at any hour by calling [phone].   DIET:  We do recommend a small meal at first, but then you may proceed to your regular diet.  Drink plenty of fluids but you should avoid alcoholic beverages for 24 hours.  ACTIVITY:  You should plan to take it easy for the rest of today and you should NOT DRIVE or use heavy machinery until tomorrow (because of the  sedation medicines used during the test).    FOLLOW UP: Our staff will call the number listed on your records 48-72 hours following your procedure to check on you and address any questions or concerns that you may have regarding the information given to you following your procedure. If we do not reach you, we will leave a message.  We will attempt to reach you two times.  During this call, we will ask if you have developed any symptoms of COVID 19. If you develop any symptoms (ie: fever, flu-like symptoms, shortness of breath, cough etc.) before then, please call [phone].  If you test positive for Covid 19 in the 2 weeks post procedure, please call and report this information to [place].    If any biopsies were taken you will be contacted by phone or by letter within the next 1-3 weeks.  Please call us at [phone] if you have not heard about the biopsies in 3 weeks.    SIGNATURES/CONFIDENTIALITY: You and/or your care partner have signed paperwork which will be entered into your electronic medical record.  These signatures attest to the fact that that the information above on your After Visit Summary has been reviewed and is understood.  Full responsibility of the confidentiality of this discharge information lies with you and/or your care-partner.

## 2019-04-02 NOTE — Op Note (Signed)
[facility] Patient Name: Patricia Moore Procedure Date: 04/02/2019 12:17 PM MRN: [id number] Endoscopist: Wilhemina Bonito. Marina Goodell , MD Age: 85 Referring MD:  Date of Birth: August 13, 1933 Gender: Female Account #: [id number] Procedure:                Colonoscopy with cold snare polypectomy x 2 Indications:              Abdominal pain, Change in bowel habits,                            Constipation. Also, personal history of adenomatous                            colon polyps. Most recent examinations 2010 in                            2015. Family history of colon cancer in parent and                            cousins Medicines:                Monitored Anesthesia Care Procedure:                Pre-Anesthesia Assessment:                           - Prior to the procedure, a History and Physical                            was performed, and patient medications and                            allergies were reviewed. The patient's tolerance of                            previous anesthesia was also reviewed. The risks                            and benefits of the procedure and the sedation                            options and risks were discussed with the patient.                            All questions were answered, and informed consent                            was obtained. Prior Anticoagulants: The patient has                            taken no previous anticoagulant or antiplatelet                            agents. ASA Grade Assessment: II - A patient with  mild systemic disease. After reviewing the risks                            and benefits, the patient was deemed in                            satisfactory condition to undergo the procedure.                           After obtaining informed consent, the colonoscope                            was passed under direct vision. Throughout the                            procedure, the patient's blood  pressure, pulse, and                            oxygen saturations were monitored continuously. The                            Colonoscope was introduced through the anus and                            advanced to the the cecum, identified by                            appendiceal orifice and ileocecal valve. The                            terminal ileum, ileocecal valve, appendiceal                            orifice, and rectum were photographed. The quality                            of the bowel preparation was excellent. The                            colonoscopy was performed without difficulty. The                            patient tolerated the procedure well. The bowel                            preparation used was SUPREP via split dose                            instruction. Scope In: 12:25:38 PM Scope Out: 12:39:47 PM Scope Withdrawal Time: 0 hours 11 minutes 4 seconds  Total Procedure Duration: 0 hours 14 minutes 9 seconds  Findings:                 Two polyps were found in the descending colon and  cecum. The polyps were 3 to 4 mm in size. These                            polyps were removed with a cold snare. Resection                            and retrieval were complete.                           Multiple diverticula were found in the ascending                            colon and left colon.                           The terminal ileum was normal. The exam was                            otherwise without abnormality on direct and                            retroflexion views. Complications:            No immediate complications. Estimated blood loss:                            None. Estimated Blood Loss:     Estimated blood loss: none. Impression:               - Two 3 to 4 mm polyps in the descending colon and                            in the cecum, removed with a cold snare. Resected                            and retrieved.                            - Diverticulosis in the ascending colon and in the                            left colon. Normal terminal ileum.                           - The examination was otherwise normal on direct                            and retroflexion views. Recommendation:           - Repeat colonoscopy is not recommended for                            surveillance.                           - Patient has a contact number available for  emergencies. The signs and symptoms of potential                            delayed complications were discussed with the                            patient. Return to normal activities tomorrow.                            Written discharge instructions were provided to the                            patient.                           - Resume previous diet.                           - Continue present medications. Continue MiraLAX as                            needed for constipation                           - Await pathology results. Wilhemina Bonito. Marina Goodell, MD 04/02/2019 12:45:58 PM This report has been signed electronically.

## 2019-04-04 ENCOUNTER — Telehealth: Payer: Self-pay

## 2019-04-04 ENCOUNTER — Encounter: Payer: Self-pay | Admitting: Internal Medicine

## 2019-04-04 MED ORDER — ONETOUCH ULTRASOFT LANCETS MISC: 12 refills | Status: DC

## 2019-04-04 NOTE — Telephone Encounter (Signed)
  Follow up Call-  Call back number 04/02/2019  Post procedure Call Back phone  # [phone]  Permission to leave phone message Yes  Some recent data might be hidden     Patient questions:  Do you have a fever, pain , or abdominal swelling? No. Pain Score  0 *  Have you tolerated food without any problems? Yes.    Have you been able to return to your normal activities? Yes.    Do you have any questions about your discharge instructions: Diet   No. Medications  No. Follow up visit  No.  Do you have questions or concerns about your Care? No.  Actions: * If pain score is 4 or above: No action needed, pain <4. 1. Have you developed a fever since your procedure? no  2.   Have you had an respiratory symptoms (SOB or cough) since your procedure? no  3.   Have you tested positive for COVID 19 since your procedure no  4.   Have you had any family members/close contacts diagnosed with the COVID 19 since your procedure?  no   If yes to any of these questions please route to Joylene John, RN and Alphonsa Gin, Therapist, sports.

## 2019-04-07 ENCOUNTER — Other Ambulatory Visit: Payer: Self-pay

## 2019-04-07 ENCOUNTER — Telehealth: Payer: Self-pay | Admitting: Internal Medicine

## 2019-04-07 ENCOUNTER — Encounter: Payer: Self-pay | Admitting: Internal Medicine

## 2019-04-07 MED ORDER — GLUCOSE BLOOD VI STRP: ORAL_STRIP | 12 refills | Status: DC

## 2019-04-07 NOTE — Telephone Encounter (Signed)
Sent!

## 2019-04-07 NOTE — Telephone Encounter (Signed)
Alprazolam refill.   Last OV: 01/13/2019 Last Fill: 11/08/2018 #30 and 2RF Pt sig: 1 tab daily prn UDS: 12/13/2018 Low risk

## 2019-04-09 ENCOUNTER — Encounter: Payer: Self-pay | Admitting: Internal Medicine

## 2019-04-30 ENCOUNTER — Encounter: Payer: Self-pay | Admitting: *Deleted

## 2019-05-01 ENCOUNTER — Ambulatory Visit: Payer: Medicare Other | Attending: Internal Medicine

## 2019-05-01 DIAGNOSIS — Z23 Encounter for immunization: Secondary | ICD-10-CM | POA: Insufficient documentation

## 2019-05-01 NOTE — Progress Notes (Signed)
Covid-19 Vaccination Clinic  Name:  Patricia Moore    MRN: [id number] DOB: 31-Jan-1934  05/01/2019  [name] was observed post Covid-19 immunization for 15 minutes without incidence. She was provided with Vaccine Information Sheet and instruction to access the V-Safe system.   [name] was instructed to call 911 with any severe reactions post vaccine: Marland Kitchen Difficulty breathing  . Swelling of your face and throat  . A fast heartbeat  . A bad rash all over your body  . Dizziness and weakness    Immunizations Administered    Name Date Dose VIS Date Route   Pfizer COVID-19 Vaccine 05/01/2019 11:56 AM 0.3 mL 03/28/2019 Intramuscular   Manufacturer: [employer], [employer]   Lot: [id number]   NDC: [address]-0

## 2019-05-02 ENCOUNTER — Ambulatory Visit: Payer: Medicare Other

## 2019-05-12 DIAGNOSIS — E119 Type 2 diabetes mellitus without complications: Secondary | ICD-10-CM | POA: Diagnosis not present

## 2019-05-12 DIAGNOSIS — H26493 Other secondary cataract, bilateral: Secondary | ICD-10-CM | POA: Diagnosis not present

## 2019-05-12 DIAGNOSIS — S0231XA Fracture of orbital floor, right side, initial encounter for closed fracture: Secondary | ICD-10-CM | POA: Diagnosis not present

## 2019-05-12 DIAGNOSIS — H401131 Primary open-angle glaucoma, bilateral, mild stage: Secondary | ICD-10-CM | POA: Diagnosis not present

## 2019-05-12 DIAGNOSIS — H532 Diplopia: Secondary | ICD-10-CM | POA: Diagnosis not present

## 2019-05-19 ENCOUNTER — Ambulatory Visit: Payer: Medicare Other | Attending: Internal Medicine

## 2019-05-19 ENCOUNTER — Ambulatory Visit: Payer: Medicare Other

## 2019-05-19 DIAGNOSIS — Z23 Encounter for immunization: Secondary | ICD-10-CM | POA: Insufficient documentation

## 2019-05-19 NOTE — Progress Notes (Signed)
Covid-19 Vaccination Clinic  Name:  Patricia Moore    MRN: [id number] DOB: 31-Jul-1933  05/19/2019  Patricia Moore was observed post Covid-19 immunization for 15 minutes without incidence. She was provided with Vaccine Information Sheet and instruction to access the V-Safe system.   Patricia Moore was instructed to call 911 with any severe reactions post vaccine: Marland Kitchen Difficulty breathing  . Swelling of your face and throat  . A fast heartbeat  . A bad rash all over your body  . Dizziness and weakness    Immunizations Administered    Name Date Dose VIS Date Route   Pfizer COVID-19 Vaccine 05/19/2019  8:12 AM 0.3 mL 03/28/2019 Intramuscular   Manufacturer: [employer], [employer]   Lot: [id number]   NDC: [address]-4

## 2019-06-06 ENCOUNTER — Other Ambulatory Visit: Payer: Self-pay

## 2019-06-06 MED ORDER — METFORMIN HCL 1000 MG PO TABS: ORAL_TABLET | ORAL | 5 refills | Status: DC

## 2019-06-12 ENCOUNTER — Other Ambulatory Visit: Payer: Self-pay

## 2019-06-12 ENCOUNTER — Ambulatory Visit (INDEPENDENT_AMBULATORY_CARE_PROVIDER_SITE_OTHER): Payer: Medicare Other | Admitting: Internal Medicine

## 2019-06-12 ENCOUNTER — Encounter: Payer: Self-pay | Admitting: Internal Medicine

## 2019-06-12 VITALS — BP 173/54 | HR 73 | Temp 98.0°F | Resp 18 | Ht 61.0 in | Wt 142.5 lb

## 2019-06-12 DIAGNOSIS — M159 Polyosteoarthritis, unspecified: Secondary | ICD-10-CM

## 2019-06-12 DIAGNOSIS — I10 Essential (primary) hypertension: Secondary | ICD-10-CM

## 2019-06-12 DIAGNOSIS — M8949 Other hypertrophic osteoarthropathy, multiple sites: Secondary | ICD-10-CM

## 2019-06-12 DIAGNOSIS — Z0000 Encounter for general adult medical examination without abnormal findings: Secondary | ICD-10-CM | POA: Diagnosis not present

## 2019-06-12 DIAGNOSIS — R7989 Other specified abnormal findings of blood chemistry: Secondary | ICD-10-CM | POA: Diagnosis not present

## 2019-06-12 DIAGNOSIS — E785 Hyperlipidemia, unspecified: Secondary | ICD-10-CM | POA: Diagnosis not present

## 2019-06-12 DIAGNOSIS — E119 Type 2 diabetes mellitus without complications: Secondary | ICD-10-CM | POA: Diagnosis not present

## 2019-06-12 LAB — CBC WITH DIFFERENTIAL/PLATELET
Basophils Absolute: 0.1 10*3/uL (ref 0.0–0.1)
Basophils Relative: 1.4 % (ref 0.0–3.0)
Eosinophils Absolute: 1.1 10*3/uL — ABNORMAL HIGH (ref 0.0–0.7)
Eosinophils Relative: 12.7 % — ABNORMAL HIGH (ref 0.0–5.0)
HCT: 40.9 % (ref 36.0–46.0)
Hemoglobin: 13.9 g/dL (ref 12.0–15.0)
Lymphocytes Relative: 26.7 % (ref 12.0–46.0)
Lymphs Abs: 2.4 10*3/uL (ref 0.7–4.0)
MCHC: 34 g/dL (ref 30.0–36.0)
MCV: 92.4 fl (ref 78.0–100.0)
Monocytes Absolute: 0.6 10*3/uL (ref 0.1–1.0)
Monocytes Relative: 6.9 % (ref 3.0–12.0)
Neutro Abs: 4.7 10*3/uL (ref 1.4–7.7)
Neutrophils Relative %: 52.3 % (ref 43.0–77.0)
Platelets: 306 10*3/uL (ref 150.0–400.0)
RBC: 4.42 Mil/uL (ref 3.87–5.11)
RDW: 12.6 % (ref 11.5–15.5)
WBC: 8.9 10*3/uL (ref 4.0–10.5)

## 2019-06-12 LAB — COMPREHENSIVE METABOLIC PANEL
ALT: 17 U/L (ref 0–35)
AST: 23 U/L (ref 0–37)
Albumin: 4.3 g/dL (ref 3.5–5.2)
Alkaline Phosphatase: 76 U/L (ref 39–117)
BUN: 29 mg/dL — ABNORMAL HIGH (ref 6–23)
CO2: 32 mEq/L (ref 19–32)
Calcium: 10.1 mg/dL (ref 8.4–10.5)
Chloride: 99 mEq/L (ref 96–112)
Creatinine, Ser: 1.03 mg/dL (ref 0.40–1.20)
GFR: 51.12 mL/min — ABNORMAL LOW (ref >60.00)
Glucose, Bld: 170 mg/dL — ABNORMAL HIGH (ref 70–99)
Potassium: 4.2 mEq/L (ref 3.5–5.1)
Sodium: 136 mEq/L (ref 135–145)
Total Bilirubin: 0.5 mg/dL (ref 0.2–1.2)
Total Protein: 7.9 g/dL (ref 6.0–8.3)

## 2019-06-12 LAB — LIPID PANEL
Cholesterol: 198 mg/dL (ref 0–200)
HDL: 63.1 mg/dL (ref >39.00)
LDL Cholesterol: 113 mg/dL — ABNORMAL HIGH (ref 0–99)
NonHDL: 134.41
Total CHOL/HDL Ratio: 3
Triglycerides: 108 mg/dL (ref 0.0–149.0)
VLDL: 21.6 mg/dL (ref 0.0–40.0)

## 2019-06-12 LAB — HIGH SENSITIVITY CRP: CRP, High Sensitivity: 6 mg/L — ABNORMAL HIGH (ref 0.000–5.000)

## 2019-06-12 MED ORDER — BACLOFEN 10 MG PO TABS: 10.0000 mg | ORAL_TABLET | Freq: Every evening | ORAL | 0 refills | Status: DC | PRN

## 2019-06-12 MED ORDER — CARVEDILOL 12.5 MG PO TABS: 12.5000 mg | ORAL_TABLET | Freq: Two times a day (BID) | ORAL | 6 refills | Status: DC

## 2019-06-12 NOTE — Progress Notes (Signed)
Pre visit review using our clinic review tool, if applicable. No additional management support is needed unless otherwise documented below in the visit note. 

## 2019-06-12 NOTE — Patient Instructions (Addendum)
Please schedule Medicare Wellness with Glenard Haring.   GO TO THE LAB : Get the blood work     GO TO [place] back for a checkup in 3 months, please make an appointment  We are increasing carvedilol to 12.5 mg: 1 tablet twice a day. Please check your blood pressure weekly, call me if he is not at goal. BP GOAL is between 110/65 and  135/85.  For neck pain: Baclofen once a day as needed, watch for excessive drowsiness Okay to take Tylenol Heating pad Call if not better

## 2019-06-12 NOTE — Progress Notes (Signed)
Subjective:    Patient ID: Patricia Moore, female    DOB: 11-14-1933, 86 y.o.   MRN: [id number]  DOS:  06/12/2019 Type of visit - description: CPX  Here for CPX, we also discussed DM, HTN.   I reviewed the chart including colonoscopy, bone DEXA results and the gynecology visit.  Overall she feels really good. 10 days ago developed pain at the left side of the posterior neck, this is not the first time she has this problem, typically baclofen helps. The pain is on and off but seems like arm motion make it worse.  Moving the neck does not affect her much. The pain radiates to the left arm, see graphic. No problems with gait or lower extremity motor deficits or paresthesias  Review of Systems  Other than above, a 14 point review of systems is negative    Past Medical History:  Diagnosis Date  . Anxiety 12/09/2013  . Arthritis   . Cataract   . Diabetes mellitus   . Diverticulitis    patient states has had three attacks   . Diverticulosis   . DJD (degenerative joint disease) 12/14/2014  . Glaucoma   . H. pylori infection 2001  . Heart murmur   . Hx of adenomatous colonic polyps   . Hyperlipidemia   . Hypertension   . Intraductal papilloma    bx nef x 2 in the 70s-90s  . Saccular aneurysm   . SCC (squamous cell carcinoma) 03/2011   sees dermatology    Past Surgical History:  Procedure Laterality Date  . BREAST BIOPSY  '91, '95   INTRADUCTAL PAPILLOMA  . BREAST LUMPECTOMY    . CATARACT EXTRACTION Bilateral 09/2014  . CLOSED REDUCTION ULNAR SHAFT Right 10/06/2017   Procedure: CLOSED REDUCTION DISLOCATED RIGHT SHOULDER;  Surgeon: Kathryne Hitch, MD;  Location: [facility] OR;  Service: Orthopedics;  Laterality: Right;  . COLONOSCOPY     Family History  Adopted: Yes  Problem Relation Age of Onset  . Heart disease Mother 2  . Heart disease Father 64  . Breast cancer Maternal Aunt   . Stroke Maternal Aunt   . Colon cancer Paternal Uncle        7 uncles  . Colon  cancer Maternal Grandfather        COLON,, FAMILY HX 7 MEMBERS  . Breast cancer Maternal Aunt   . Cancer Sister        skin  . Hypertension Neg Hx   . Diabetes Neg Hx   . Esophageal cancer Neg Hx   . Rectal cancer Neg Hx   . Stomach cancer Neg Hx     Allergies as of 06/12/2019   No Known Allergies     Medication List       Accurate as of June 12, 2019 11:59 PM. If you have any questions, ask your nurse or doctor.        STOP taking these medications   ciprofloxacin 500 MG tablet Commonly known as: Cipro Stopped by: Willow Ora, MD   metroNIDAZOLE 500 MG tablet Commonly known as: FLAGYL Stopped by: Willow Ora, MD     TAKE these medications   ALPRAZolam 0.25 MG tablet Commonly known as: XANAX TAKE 1 TABLET(0.25 MG) BY MOUTH DAILY AS NEEDED   baclofen 10 MG tablet Commonly known as: LIORESAL Take 1 tablet (10 mg total) by mouth at bedtime as needed for muscle spasms. Started by: Willow Ora, MD   carvedilol 12.5 MG tablet Commonly known as:  COREG Take 1 tablet (12.5 mg total) by mouth 2 (two) times daily with a meal. What changed:   medication strength  how much to take Changed by: Willow Ora, MD   cholecalciferol 1000 units tablet Commonly known as: VITAMIN D Take 1,000 Units by mouth daily.   co-enzyme Q-10 30 MG capsule Take 30 mg by mouth daily.   dorzolamide-timolol 22.3-6.8 MG/ML ophthalmic solution Commonly known as: COSOPT Place 1 drop into both eyes 2 (two) times daily.   folic acid 1 MG tablet Commonly known as: FOLVITE Take 1 tablet (1 mg total) by mouth daily.   glucose blood test strip Commonly known as: ONE TOUCH ULTRA TEST CHECK BLOOD SUGAR NO MORE THAN 2 TIMES DAILY   glyBURIDE 2.5 MG tablet Commonly known as: DIABETA Take 1 tablet (2.5 mg total) by mouth daily with breakfast.   metFORMIN 1000 MG tablet Commonly known as: GLUCOPHAGE TAKE 1 TABLET BY MOUTH WITH BREAKFAST, 1 TABLET WITH LUNCH AND 1/2 TABLET WITH DINNER   onetouch  ultrasoft lancets CHECK BLOOD SUGAR NO MORE THAN 2 TIMES DAILY   Restasis 0.05 % ophthalmic emulsion Generic drug: cycloSPORINE   vitamin B-12 250 MCG tablet Commonly known as: CYANOCOBALAMIN Take 250 mcg by mouth daily.             Objective:   Physical Exam Musculoskeletal:       Arms:    BP (!) 173/54 (BP Location: Left Arm, Patient Position: Sitting, Cuff Size: Small)   Pulse 73   Temp 98 F (36.7 C) (Temporal)   Resp 18   Ht 5\' 1"  (1.549 m)   Wt 142 lb 8 oz (64.6 kg)   LMP 04/17/1988   SpO2 100%   BMI 26.93 kg/m  General: Well developed, NAD, BMI noted Neck: No TTP, range of motion seemed appropriate. HEENT:  Normocephalic . Face symmetric, atraumatic Lungs:  CTA B Normal respiratory effort, no intercostal retractions, no accessory muscle use. Heart: RRR,  no murmur.  Abdomen:  Not distended, soft, non-tender. No rebound or rigidity.   Lower extremities: no pretibial edema bilaterally  Skin: Exposed areas without rash. Not pale. Not jaundice Neurologic:  alert & oriented X3.  Speech normal, gait appropriate for age and unassisted Strength symmetric and appropriate for age. DTRs slightly decreased symmetrically.  Motor normal. Psych: Cognition and judgment appear intact.  Cooperative with normal attention span and concentration.  Behavior appropriate. No anxious or depressed appearing.     Assessment      Assessment  DM HTN -Losartan d/c d/t hyperkalemia -Amlodipine d/c d/t edema 07-2017 - HCTZ d/c d/t hyperkalemia 08/2017 Hyperlipidemia -- intolerant to lipitor and crestor (severe cramps).  Declined further trial with medications  on 01/16/2018   Anxiety- xanax rx by pcp Vertigo - valium prn GI: --Recurrent diverticulitis (one episode documented by a CT 11-2014) --H. pylori 2001 DJD--back pain, knees, neck pain ( on baclofen for neck) Osteoporosis : Tscore -1.9 on 11-2015 , T score  -1.8 (03/2019); h/o  R ulnar FX 2019 SCC, skin  cancer Elevated homocysteine Raynaud's phenomena  NEURO --Carotid US 10-2013: No significant plaque, although ICA's are serpentine, bilaterally. 40-59% bilateral ICA stenosis by velocity criteria likely due to tortuosity. Patent vertebral arteries with antegrade flow.Normal subclavian arteries, bilaterally. -- Transinet dizziness, diplopia sx late 2016 , saw neuro >>> Dx w/ TIA/Stroke on clinical grounds  W/u:  Brain  MRI 03-2015 >> 2 mm saccular aneurysm at the right posterior communicating artery origin, saw  Neurology, Rx  to recheck MRI 1 year, ok continue ASA per neuro CTA head, neck 05-14-2015: aneurysm confirmed, otherwise (-) --brain aneurysm see above, saw neuro 2018, consider repeat imagine 2020 --Admitted 07-2015, dizziness, MRI negative, DX labyrinthitis   PLAN:  Here for CPX DM: Continue glyburide, Metformin, CBGs typically in the 140s.  Check a A1c HTN: BP today 175/54, at home slightly elevated but never more than 150.  Increase carvedilol to 12.5 mg twice daily, continue monitoring, reassess in 3 months Hyperlipidemia: Declines further medications but likes to know her levels.  Check a FLP at patient request DJD: Flareup of neck pain for the last 10 days, see HPI,  RX baclofen, watch for drowsiness.  Also Tylenol as needed and heating pad. Elevated homocystine: On folic acid and B12, likes her homocystine level and CRP checked.  Will do Osteopenia: T score -1.8 on December 2020.  On calcium and vitamin D. RTC 3 months  In addition to her physical exam we address diabetes, HTN, high cholesterol, DJD, elevated homocystine and osteopenia.  Chart was reviewed.  This visit occurred during the SARS-CoV-2 public health emergency.  Safety protocols were in place, including screening questions prior to the visit, additional usage of staff PPE, and extensive cleaning of exam room while observing appropriate contact time as indicated for disinfecting solutions.

## 2019-06-13 DIAGNOSIS — H26493 Other secondary cataract, bilateral: Secondary | ICD-10-CM | POA: Diagnosis not present

## 2019-06-13 LAB — HEMOGLOBIN A1C: Hgb A1c MFr Bld: 6.7 % — ABNORMAL HIGH (ref 4.6–6.5)

## 2019-06-13 LAB — HOMOCYSTEINE: Homocysteine: 11 umol/L — ABNORMAL HIGH (ref <10.4)

## 2019-06-14 NOTE — Assessment & Plan Note (Signed)
--  Td 2019; Pneumonia shot 2014declined booster d/t h/o poor tolerance; prevnar:01-2015; zostavax:2011 - s/p covid shots x 2  CCS: -CCS: colonoscopy 10-2013, + polyps, colonoscopy 04/02/2019, + polyps, diverticulosis, No follow-up recommended - Female care:  last gyn OV 02/2019;  h/o fibroid tumor, no treatment necessary unless she is symptomatic. MMG11/2020   -Counseled: Diet and exercise. -Labs: CRP, homocystine level, CMP, FLP, CBC, A1c

## 2019-06-14 NOTE — Assessment & Plan Note (Signed)
Here for CPX DM: Continue glyburide, Metformin, CBGs typically in the 140s.  Check a A1c HTN: BP today 175/54, at home slightly elevated but never more than 150.  Increase carvedilol to 12.5 mg twice daily, continue monitoring, reassess in 3 months Hyperlipidemia: Declines further medications but likes to know her levels.  Check a FLP at patient request DJD: Flareup of neck pain for the last 10 days, see HPI,  RX baclofen, watch for drowsiness.  Also Tylenol as needed and heating pad. Elevated homocystine: On folic acid and [id number], likes her homocystine level and CRP checked.  Will do Osteopenia: T score -1.8 on December 2020.  On calcium and vitamin D. RTC 3 months

## 2019-06-18 ENCOUNTER — Other Ambulatory Visit: Payer: Self-pay | Admitting: Internal Medicine

## 2019-06-18 MED ORDER — FOLIC ACID 1 MG PO TABS: 1.0000 mg | ORAL_TABLET | Freq: Every day | ORAL | 3 refills | Status: DC

## 2019-07-04 ENCOUNTER — Telehealth: Payer: Self-pay | Admitting: Neurology

## 2019-07-04 NOTE — Telephone Encounter (Signed)
Patient left msg with after hours about feeling a lump on her head and being dizzy a lot. Has not fallen or hit her head recently. HX of aneurysm. No fever. The on call nurse  With after hours instructed her to go to the ED now. Just FYI. Thanks!

## 2019-07-07 ENCOUNTER — Telehealth: Payer: Self-pay | Admitting: Internal Medicine

## 2019-07-07 ENCOUNTER — Other Ambulatory Visit: Payer: Self-pay

## 2019-07-07 ENCOUNTER — Ambulatory Visit: Payer: Medicare Other | Admitting: Neurology

## 2019-07-07 ENCOUNTER — Encounter: Payer: Self-pay | Admitting: Neurology

## 2019-07-07 VITALS — BP 162/64 | HR 71 | Resp 18 | Ht 61.0 in | Wt 143.0 lb

## 2019-07-07 DIAGNOSIS — I671 Cerebral aneurysm, nonruptured: Secondary | ICD-10-CM | POA: Diagnosis not present

## 2019-07-07 DIAGNOSIS — M4722 Other spondylosis with radiculopathy, cervical region: Secondary | ICD-10-CM | POA: Diagnosis not present

## 2019-07-07 DIAGNOSIS — H8110 Benign paroxysmal vertigo, unspecified ear: Secondary | ICD-10-CM | POA: Diagnosis not present

## 2019-07-07 MED ORDER — CARVEDILOL 25 MG PO TABS: 25.0000 mg | ORAL_TABLET | Freq: Two times a day (BID) | ORAL | 3 refills | Status: DC

## 2019-07-07 MED ORDER — GABAPENTIN 100 MG PO CAPS: 100.0000 mg | ORAL_CAPSULE | Freq: Every day | ORAL | 5 refills | Status: DC

## 2019-07-07 NOTE — Patient Instructions (Addendum)
1. For nerve pain in neck, start gabapentin 100mg  at bedtime 2.  To follow up on aneurysm, will check another MRA of head 3.  Follow up in 4 months.  We have sent a referral to [facility] for your MRA  and they will call you directly to schedule your appointment. They are located at [place]. If you need to contact them directly please call [phone].

## 2019-07-07 NOTE — Telephone Encounter (Signed)
Call the patient, I reviewed the note from neurology, BP was still elevated at 162/64, heart rate 71. Plan: Increase carvedilol to 25 mg twice a day, send a new prescription. Monitor BPs at home. Patient to call with readings in 2 weeks.

## 2019-07-07 NOTE — Telephone Encounter (Signed)
LMOM informing Pt of recommendations. Carvedilol 25mg  bid sent to [facility]. Instructed Pt to call if questions/concerns.
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
## 2019-07-11 DIAGNOSIS — H26492 Other secondary cataract, left eye: Secondary | ICD-10-CM | POA: Diagnosis not present

## 2019-07-11 LAB — HM DIABETES EYE EXAM
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
## 2019-07-28 ENCOUNTER — Other Ambulatory Visit: Payer: Self-pay

## 2019-07-28 ENCOUNTER — Encounter: Payer: Self-pay | Admitting: Internal Medicine

## 2019-07-28 ENCOUNTER — Telehealth: Payer: Self-pay | Admitting: Internal Medicine

## 2019-07-28 ENCOUNTER — Ambulatory Visit (INDEPENDENT_AMBULATORY_CARE_PROVIDER_SITE_OTHER): Payer: Medicare Other | Admitting: Internal Medicine

## 2019-07-28 VITALS — BP 148/58 | HR 61 | Temp 98.2°F | Resp 18 | Ht 61.0 in | Wt 145.5 lb

## 2019-07-28 DIAGNOSIS — R002 Palpitations: Secondary | ICD-10-CM

## 2019-07-28 DIAGNOSIS — R079 Chest pain, unspecified: Secondary | ICD-10-CM | POA: Diagnosis not present

## 2019-07-28 LAB — CBC WITH DIFFERENTIAL/PLATELET
Basophils Absolute: 0.1 10*3/uL (ref 0.0–0.1)
Basophils Relative: 1.4 % (ref 0.0–3.0)
Eosinophils Absolute: 1.1 10*3/uL — ABNORMAL HIGH (ref 0.0–0.7)
Eosinophils Relative: 11.3 % — ABNORMAL HIGH (ref 0.0–5.0)
HCT: 39.8 % (ref 36.0–46.0)
Hemoglobin: 13.4 g/dL (ref 12.0–15.0)
Lymphocytes Relative: 27.3 % (ref 12.0–46.0)
Lymphs Abs: 2.6 10*3/uL (ref 0.7–4.0)
MCHC: 33.7 g/dL (ref 30.0–36.0)
MCV: 92.6 fl (ref 78.0–100.0)
Monocytes Absolute: 0.7 10*3/uL (ref 0.1–1.0)
Monocytes Relative: 7 % (ref 3.0–12.0)
Neutro Abs: 5.1 10*3/uL (ref 1.4–7.7)
Neutrophils Relative %: 53 % (ref 43.0–77.0)
Platelets: 281 10*3/uL (ref 150.0–400.0)
RBC: 4.29 Mil/uL (ref 3.87–5.11)
RDW: 12.8 % (ref 11.5–15.5)
WBC: 9.6 10*3/uL (ref 4.0–10.5)

## 2019-07-28 LAB — BASIC METABOLIC PANEL
BUN: 21 mg/dL (ref 6–23)
CO2: 31 mEq/L (ref 19–32)
Calcium: 10.2 mg/dL (ref 8.4–10.5)
Chloride: 98 mEq/L (ref 96–112)
Creatinine, Ser: 0.96 mg/dL (ref 0.40–1.20)
GFR: 55.43 mL/min — ABNORMAL LOW (ref >60.00)
Glucose, Bld: 133 mg/dL — ABNORMAL HIGH (ref 70–99)
Potassium: 4.5 mEq/L (ref 3.5–5.1)
Sodium: 135 mEq/L (ref 135–145)

## 2019-07-28 NOTE — Progress Notes (Signed)
Subjective:    Patient ID: Patricia Moore, female    DOB: 12-05-1933, 86 y.o.   MRN: [id number]  DOS:  07/28/2019 Type of visit - description: Acute The night of 07/26/2019, she was resting in bed, when suddenly developed palpitations, some shortness of breath and some chest tightness described as "rattling in the chest", no wheezing. She started to boil water and inhale the vapor and gradually felt better after few hours. O2 sat at some point was 84% and heart rate between 60 and 70. The next day she was asymptomatic except for transient lower extremity edema which is now resolved.   Review of Systems Denies fever chills Other than transient lower extremity edema no swelling. Denies nausea, vomiting, diarrhea.  No blood in the stools. No myalgias No recent prolonged car trips, no calf pain. No cough, wheezing, sputum production.   Past Medical History:  Diagnosis Date  . Anxiety 12/09/2013  . Arthritis   . Cataract   . Diabetes mellitus   . Diverticulitis    patient states has had three attacks   . Diverticulosis   . DJD (degenerative joint disease) 12/14/2014  . Glaucoma   . H. pylori infection 2001  . Heart murmur   . Hx of adenomatous colonic polyps   . Hyperlipidemia   . Hypertension   . Intraductal papilloma    bx nef x 2 in the 70s-90s  . Saccular aneurysm   . SCC (squamous cell carcinoma) 03/2011   sees dermatology    Past Surgical History:  Procedure Laterality Date  . BREAST BIOPSY  '91, '95   INTRADUCTAL PAPILLOMA  . BREAST LUMPECTOMY    . CATARACT EXTRACTION Bilateral 09/2014  . CLOSED REDUCTION ULNAR SHAFT Right 10/06/2017   Procedure: CLOSED REDUCTION DISLOCATED RIGHT SHOULDER;  Surgeon: Kathryne Hitch, MD;  Location: [facility] OR;  Service: Orthopedics;  Laterality: Right;  . COLONOSCOPY      Allergies as of 07/28/2019   No Known Allergies     Medication List       Accurate as of July 28, 2019 11:59 PM. If you have any questions, ask your  nurse or doctor.        ALPRAZolam 0.25 MG tablet Commonly known as: XANAX TAKE 1 TABLET(0.25 MG) BY MOUTH DAILY AS NEEDED   baclofen 10 MG tablet Commonly known as: LIORESAL Take 1 tablet (10 mg total) by mouth at bedtime as needed for muscle spasms.   carvedilol 25 MG tablet Commonly known as: COREG Take 1 tablet (25 mg total) by mouth 2 (two) times daily with a meal.   cholecalciferol 1000 units tablet Commonly known as: VITAMIN D Take 1,000 Units by mouth daily.   co-enzyme Q-10 30 MG capsule Take 30 mg by mouth daily.   dorzolamide-timolol 22.3-6.8 MG/ML ophthalmic solution Commonly known as: COSOPT Place 1 drop into both eyes 2 (two) times daily.   folic acid 1 MG tablet Commonly known as: FOLVITE Take 1 tablet (1 mg total) by mouth daily.   gabapentin 100 MG capsule Commonly known as: Neurontin Take 1 capsule (100 mg total) by mouth at bedtime.   glucose blood test strip Commonly known as: ONE TOUCH ULTRA TEST CHECK BLOOD SUGAR NO MORE THAN 2 TIMES DAILY   glyBURIDE 2.5 MG tablet Commonly known as: DIABETA Take 1 tablet (2.5 mg total) by mouth daily with breakfast.   metFORMIN 1000 MG tablet Commonly known as: GLUCOPHAGE TAKE 1 TABLET BY MOUTH WITH BREAKFAST, 1 TABLET WITH LUNCH  AND 1/2 TABLET WITH DINNER   onetouch ultrasoft lancets CHECK BLOOD SUGAR NO MORE THAN 2 TIMES DAILY   Restasis 0.05 % ophthalmic emulsion Generic drug: cycloSPORINE   vitamin B-12 250 MCG tablet Commonly known as: CYANOCOBALAMIN Take 250 mcg by mouth daily.          Objective:   Physical Exam BP (!) 148/58 (BP Location: Left Arm, Patient Position: Sitting, Cuff Size: Small)   Pulse 61   Temp 98.2 F (36.8 C) (Temporal)   Resp 18   Ht 5\' 1"  (1.549 m)   Wt 145 lb 8 oz (66 kg)   LMP 04/17/1988   SpO2 100%   BMI 27.49 kg/m  General:   Well developed, NAD, BMI noted.  HEENT:  Normocephalic . Face symmetric, atraumatic Lungs:  CTA B Normal respiratory effort,  no intercostal retractions, no accessory muscle use. Heart: RRR,  no murmur.  Abdomen:  Not distended, soft, non-tender. No rebound or rigidity.   Skin: Not pale. Not jaundice Lower extremities: no pretibial edema bilaterally  Neurologic:  alert & oriented X3.  Speech normal, gait appropriate for age and unassisted Psych--  Cognition and judgment appear intact.  Cooperative with normal attention span and concentration.  Behavior appropriate. No anxious or depressed appearing.     Assessment       Assessment  DM HTN -Losartan d/c d/t hyperkalemia -Amlodipine d/c d/t edema 07-2017 - HCTZ d/c d/t hyperkalemia 08/2017 Hyperlipidemia -- intolerant to lipitor and crestor (severe cramps).  Declined further trial with medications  on 01/16/2018   Anxiety- xanax rx by pcp Vertigo - valium prn GI: --Recurrent diverticulitis (one episode documented by a CT 11-2014) --H. pylori 2001 DJD--back pain, knees, neck pain ( on baclofen for neck) Osteoporosis : Tscore -1.9 on 11-2015 , T score  -1.8 (03/2019); h/o  R ulnar FX 2019 SCC, skin cancer Elevated homocysteine Raynaud's phenomena  NEURO --Carotid US 10-2013: No significant plaque, although ICA's are serpentine, bilaterally. 40-59% bilateral ICA stenosis by velocity criteria likely due to tortuosity. Patent vertebral arteries with antegrade flow.Normal subclavian arteries, bilaterally. -- Transinet dizziness, diplopia sx late 2016 , saw neuro >>> Dx w/ TIA/Stroke on clinical grounds  W/u:  Brain  MRI 03-2015 >> 2 mm saccular aneurysm at the right posterior communicating artery origin, saw  Neurology, Rx to recheck MRI 1 year, ok continue ASA per neuro CTA head, neck 05-14-2015: aneurysm confirmed, otherwise (-) --brain aneurysm see above, saw neuro 2018, consider repeat imagine 2020 --Admitted 07-2015, dizziness, MRI negative, DX labyrinthitis   PLAN:   Palpitations, chest pain: Single episode of palpitations and chest tightness 2 days ago,  lasted few hours, now asymptomatic.  Chart is reviewed. -H/o  NSVT 2018 on monitor.  -Echo 09/2018: Mild MR. -Saw cardiology 11/29/2018 d/t CP >> (-)  Exercise stress test 12/10/2018 -Recent labs okay. EKG today: NSR Etiology not completely clear, DDx includes tachyarrhythmia. Plan: Repeat basic labs: BMP, CBC, TSH Cardiology referral, my need to repeat echo, Zio patch? ER if symptoms severe DJD: See last visit, since then saw neurosurgery for neck pain, gabapentin restarted, they also recommended brain MRI to follow-up on brain aneurysm. RTC scheduled for 09/09/2019.   This visit occurred during the SARS-CoV-2 public health emergency.  Safety protocols were in place, including screening questions prior to the visit, additional usage of staff PPE, and extensive cleaning of exam room while observing appropriate contact time as indicated for disinfecting solutions.

## 2019-07-28 NOTE — Progress Notes (Signed)
Cardiology Office Note   Date:  07/30/2019   ID:  Patricia Moore, Patricia Moore 05-Dec-1933, MRN [id number]  PCP:  Wanda Plump, MD  Cardiologist:   Rollene Rotunda, MD   Chief Complaint  Patient presents with  . Palpitations      History of Present Illness: Patricia Moore is a 86 y.o. female who presents for follow up of a syncope.   This happened on February 2017.  She woke up and she felt dizzy.  She has been treated with meclizine.   She has been treated for labyrinthitis as well.  She had a normal echo.  She did have monitor in 2018 that demonstrated NSVT.   She came to the office yesterday and she was complaining of neck pain and arm pain.  She had a negative POET (Plain Old Exercise Treadmill)  She presents for follow up.  The other night she had an episode where her oxygen saturation fell.  She was in bed.  She felt like her heart was beating hard.  She felt like there was a "snoring" in her chest.  She felt anxious.  She did want to disturb her husband so she got up and went into another room and inhaled some eucalyptus vapors.  Symptoms persisted for a few hours and then went away.  She was not describing classic PND or orthopnea.  She was having some palpitations.  She was not having any chest pressure, neck or arm discomfort.  She was better the next day.  She had been fine before this.  She has been able to be active.  She went to see Dr. Drue Novel and there were no acute findings.  She has not had any chest pressure, neck or arm discomfort.  She has not had any weight gain or edema.   Past Medical History:  Diagnosis Date  . Anxiety 12/09/2013  . Arthritis   . Cataract   . Diabetes mellitus   . Diverticulitis    patient states has had three attacks   . Diverticulosis   . DJD (degenerative joint disease) 12/14/2014  . Glaucoma   . H. pylori infection 2001  . Heart murmur   . Hx of adenomatous colonic polyps   . Hyperlipidemia   . Hypertension   . Intraductal papilloma    bx nef x 2  in the 70s-90s  . Saccular aneurysm   . SCC (squamous cell carcinoma) 03/2011   sees dermatology    Past Surgical History:  Procedure Laterality Date  . BREAST BIOPSY  '91, '95   INTRADUCTAL PAPILLOMA  . BREAST LUMPECTOMY    . CATARACT EXTRACTION Bilateral 09/2014  . CLOSED REDUCTION ULNAR SHAFT Right 10/06/2017   Procedure: CLOSED REDUCTION DISLOCATED RIGHT SHOULDER;  Surgeon: Kathryne Hitch, MD;  Location: [facility] OR;  Service: Orthopedics;  Laterality: Right;  . COLONOSCOPY       Current Outpatient Medications  Medication Sig Dispense Refill  . ALPRAZolam (XANAX) 0.25 MG tablet TAKE 1 TABLET(0.25 MG) BY MOUTH DAILY AS NEEDED 30 tablet 2  . baclofen (LIORESAL) 10 MG tablet Take 1 tablet (10 mg total) by mouth at bedtime as needed for muscle spasms. 30 each 0  . carvedilol (COREG) 25 MG tablet Take 1 tablet (25 mg total) by mouth 2 (two) times daily with a meal. 60 tablet 3  . cholecalciferol (VITAMIN D) 1000 UNITS tablet Take 1,000 Units by mouth daily.    Marland Kitchen co-enzyme Q-10 30 MG capsule Take 30 mg  by mouth daily.    . dorzolamide-timolol (COSOPT) 22.3-6.8 MG/ML ophthalmic solution Place 1 drop into both eyes 2 (two) times daily.     . folic acid (FOLVITE) 1 MG tablet Take 1 tablet (1 mg total) by mouth daily. 90 tablet 3  . gabapentin (NEURONTIN) 100 MG capsule Take 1 capsule (100 mg total) by mouth at bedtime. 30 capsule 5  . glucose blood (ONE TOUCH ULTRA TEST) test strip CHECK BLOOD SUGAR NO MORE THAN 2 TIMES DAILY 200 each 12  . glyBURIDE (DIABETA) 2.5 MG tablet Take 1 tablet (2.5 mg total) by mouth daily with breakfast.    . Lancets (ONETOUCH ULTRASOFT) lancets CHECK BLOOD SUGAR NO MORE THAN 2 TIMES DAILY 100 each 12  . metFORMIN (GLUCOPHAGE) 1000 MG tablet TAKE 1 TABLET BY MOUTH WITH BREAKFAST, 1 TABLET WITH LUNCH AND 1/2 TABLET WITH DINNER 75 tablet 5  . RESTASIS 0.05 % ophthalmic emulsion     . vitamin B-12 (CYANOCOBALAMIN) 250 MCG tablet Take 250 mcg by mouth daily.      No current facility-administered medications for this visit.    Allergies:   Patient has no known allergies.    ROS:  Please see the history of present illness.   Otherwise, review of systems are positive for none.   All other systems are reviewed and negative.    PHYSICAL EXAM: VS:  BP (!) 142/70   Pulse 66   Temp 98.1 F (36.7 C)   Ht 5\' 1"  (1.549 m)   Wt 145 lb 12.8 oz (66.1 kg)   LMP 04/17/1988   SpO2 97%   BMI 27.55 kg/m  , BMI Body mass index is 27.55 kg/m. GENERAL:  Well appearing NECK:  No jugular venous distention, waveform within normal limits, carotid upstroke brisk and symmetric, no bruits, no thyromegaly LUNGS:  Clear to auscultation bilaterally CHEST:  Unremarkable HEART:  PMI not displaced or sustained,S1 and S2 within normal limits, no S3, no S4, no clicks, no rubs, soft brief systolic murmur heard really best at the left upper sternal border, no diastolic murmurs ABD:  Flat, positive bowel sounds normal in frequency in pitch, no bruits, no rebound, no guarding, no midline pulsatile mass, no hepatomegaly, no splenomegaly EXT:  2 plus pulses throughout, no edema, no cyanosis no clubbing   EKG:  EKG is  ordered today. The ekg ordered today demonstrates sinus rhythm, rate 66, axis within normal limits, intervals within normal limits, no acute ST-T wave changes.   Recent Labs: 06/12/2019: ALT 17 07/28/2019: BUN 21; Creatinine, Ser 0.96; Hemoglobin 13.4; Platelets 281.0; Potassium 4.5; Sodium 135; TSH 0.84    Lipid Panel    Component Value Date/Time   CHOL 198 06/12/2019 1006   TRIG 108.0 06/12/2019 1006   HDL 63.10 06/12/2019 1006   CHOLHDL 3 06/12/2019 1006   VLDL 21.6 06/12/2019 1006   LDLCALC 113 (H) 06/12/2019 1006   LDLDIRECT 131.9 10/28/2010 1103      Wt Readings from Last 3 Encounters:  07/30/19 145 lb 12.8 oz (66.1 kg)  07/28/19 145 lb 8 oz (66 kg)  07/07/19 143 lb (64.9 kg)      Other studies Reviewed: Additional studies/ records that  were reviewed today include: None. Review of the above records demonstrates:  Please see elsewhere in the note.     ASSESSMENT AND PLAN:   PALPITAITONS/SOB:    The patient has had no further chest discomfort.  However, she has had the episode as described above.  I told  her she needs to call EMS the next time that happens.  She had a negative  POET (Plain Old Exercise Treadmill) last year.  At this point I am going to apply a 2-week event monitor to make sure there is no arrhythmias.    SYNCOPE:   She has had no further episodes.  She is seeing a neurologist and she does have a history of a brain aneurysm so an MRI planned.     HTN:    Blood pressures well controlled.  No change in therapy.  CAROTID STENOSIS:   She has no significant stenosis on carotid Doppler.  COVID EDUCATION:   She has received her vaccine.   Current medicines are reviewed at length with the patient today.  The patient does not have concerns and correct regarding medicines.  The following changes have been made: No change  Labs/ tests ordered today include:   Orders Placed This Encounter  Procedures  . LONG TERM MONITOR (3-14 DAYS)  . EKG 12-Lead     Disposition:   FU with me in 1 year unless she has recurrent symptoms   Signed, Rollene Rotunda, MD  07/30/2019 10:46 AM    [facility]

## 2019-07-28 NOTE — Patient Instructions (Addendum)
   Check the  blood pressure 2 or 3 times a week along with your pulse and oxygen BP GOAL is between 110/65 and  135/85 Your heart rate should be between 60 and 99 Your oxygen should be always more than 94%. If it is consistently higher or lower, let me know  Go to the ER if chest pain, your heart to start going fast/palpitations, difficulty breathing   GO TO THE LAB : Get the blood work

## 2019-07-28 NOTE — Progress Notes (Signed)
Pre visit review using our clinic review tool, if applicable. No additional management support is needed unless otherwise documented below in the visit note. 

## 2019-07-28 NOTE — Telephone Encounter (Signed)
Patient attempted to make appt regarding chest pain , patient was transferred to triage nurse regarding symptoms .

## 2019-07-29 LAB — TSH: TSH: 0.84 u[IU]/mL (ref 0.35–4.50)

## 2019-07-29 NOTE — Assessment & Plan Note (Signed)
Palpitations, chest pain: Single episode of palpitations and chest tightness 2 days ago, lasted few hours, now asymptomatic.  Chart is reviewed. -H/o  NSVT 2018 on monitor.  -Echo 09/2018: Mild MR. -Saw cardiology 11/29/2018 d/t CP >> (-)  Exercise stress test 12/10/2018 -Recent labs okay. EKG today: NSR Etiology not completely clear, DDx includes tachyarrhythmia. Plan: Repeat basic labs: BMP, CBC, TSH Cardiology referral, my need to repeat echo, Zio patch? ER if symptoms severe DJD: See last visit, since then saw neurosurgery for neck pain, gabapentin restarted, they also recommended brain MRI to follow-up on brain aneurysm. RTC scheduled for 09/09/2019.

## 2019-07-30 ENCOUNTER — Encounter: Payer: Self-pay | Admitting: Cardiology

## 2019-07-30 ENCOUNTER — Encounter: Payer: Self-pay | Admitting: *Deleted

## 2019-07-30 ENCOUNTER — Ambulatory Visit: Payer: Medicare Other | Admitting: Cardiology

## 2019-07-30 ENCOUNTER — Other Ambulatory Visit: Payer: Self-pay

## 2019-07-30 VITALS — BP 142/70 | HR 66 | Temp 98.1°F | Ht 61.0 in | Wt 145.8 lb

## 2019-07-30 DIAGNOSIS — R072 Precordial pain: Secondary | ICD-10-CM

## 2019-07-30 DIAGNOSIS — Z7189 Other specified counseling: Secondary | ICD-10-CM

## 2019-07-30 DIAGNOSIS — R002 Palpitations: Secondary | ICD-10-CM | POA: Diagnosis not present

## 2019-07-30 DIAGNOSIS — I10 Essential (primary) hypertension: Secondary | ICD-10-CM | POA: Diagnosis not present

## 2019-07-30 DIAGNOSIS — R55 Syncope and collapse: Secondary | ICD-10-CM | POA: Diagnosis not present

## 2019-07-30 NOTE — Progress Notes (Signed)
Patient ID: Patricia Moore, female   DOB: 1933-06-17, 86 y.o.   MRN: [id number] Patient enrolled for [employer] to ship a 14 day ZIO XT long term holter monitor to her home.  Instructions sent to patient via My Chart message and will also be included in the monitor kit.

## 2019-07-30 NOTE — Patient Instructions (Signed)
Medication Instructions:  No changes *If you need a refill on your cardiac medications before your next appointment, please call your pharmacy*  Lab Work: None ordered this visit  Testing/Procedures: Your physician has recommended that you wear an event monitor. Event monitors are medical devices that record the heart's electrical activity. Doctors most often [place] these monitors to diagnose arrhythmias. Arrhythmias are problems with the speed or rhythm of the heartbeat. The monitor is a small, portable device. You can wear one while you do your normal daily activities. This is usually used to diagnose what is causing palpitations/syncope (passing out).  Follow-Up: At [facility], you and your health needs are our priority.  As part of our continuing mission to provide you with exceptional heart care, we have created designated Provider Care Teams.  These Care Teams include your primary Cardiologist (physician) and Advanced Practice Providers (APPs -  Physician Assistants and Nurse Practitioners) who all work together to provide you with the care you need, when you need it.  Your next appointment:   12 month(s)  You will receive a reminder letter in the mail two months in advance. If you don't receive a letter, please call our office to schedule the follow-up appointment.  The format for your next appointment:   In Person  Provider:   Minus Breeding, MD  Other Instructions Your physician has recommended that you wear a 14 DAY ZIO-PATCH monitor. The Zio patch cardiac monitor continuously records heart rhythm data for up to 14 days, this is for patients being evaluated for multiple types heart rhythms. For the first 24 hours post application, please avoid getting the Zio monitor wet in the shower or by excessive sweating during exercise. After that, feel free to carry on with regular activities. Keep soaps and lotions away from the ZIO XT Patch.  This will be mailed to you, please expect 7-10  days to receive.  Can be placed at our [facility] location - [address], [address].

## 2019-08-04 ENCOUNTER — Other Ambulatory Visit: Payer: Self-pay

## 2019-08-04 ENCOUNTER — Encounter: Payer: Self-pay | Admitting: Family Medicine

## 2019-08-04 ENCOUNTER — Ambulatory Visit (INDEPENDENT_AMBULATORY_CARE_PROVIDER_SITE_OTHER): Payer: Medicare Other | Admitting: Family Medicine

## 2019-08-04 VITALS — BP 120/60 | HR 61 | Temp 97.6°F | Resp 18 | Ht 61.0 in | Wt 143.8 lb

## 2019-08-04 DIAGNOSIS — L249 Irritant contact dermatitis, unspecified cause: Secondary | ICD-10-CM

## 2019-08-04 MED ORDER — TRIAMCINOLONE ACETONIDE 0.1 % EX CREA: 1.0000 "application " | TOPICAL_CREAM | Freq: Two times a day (BID) | CUTANEOUS | 0 refills | Status: DC

## 2019-08-04 NOTE — Patient Instructions (Signed)
Contact Dermatitis Dermatitis is redness, soreness, and swelling (inflammation) of the skin. Contact dermatitis is a reaction to certain substances that touch the skin. Many different substances can cause contact dermatitis. There are two types of contact dermatitis:  Irritant contact dermatitis. This type is caused by something that irritates your skin, such as having dry hands from washing them too often with soap. This type does not require previous exposure to the substance for a reaction to occur. This is the most common type.  Allergic contact dermatitis. This type is caused by a substance that you are allergic to, such as poison ivy. This type occurs when you have been exposed to the substance (allergen) and develop a sensitivity to it. Dermatitis may develop soon after your first exposure to the allergen, or it may not develop until the next time you are exposed and every time thereafter. What are the causes? Irritant contact dermatitis is most commonly caused by exposure to:  Makeup.  Soaps.  Detergents.  Bleaches.  Acids.  Metal salts, such as nickel. Allergic contact dermatitis is most commonly caused by exposure to:  Poisonous plants.  Chemicals.  Jewelry.  Latex.  Medicines.  Preservatives in products, such as clothing. What increases the risk? You are more likely to develop this condition if you have:  A job that exposes you to irritants or allergens.  Certain medical conditions, such as asthma or eczema. What are the signs or symptoms? Symptoms of this condition may occur on your body anywhere the irritant has touched you or is touched by you.  Symptoms include: ? Dryness or flaking. ? Redness. ? Cracks. ? Itching. ? Pain or a burning feeling. ? Blisters. ? Drainage of small amounts of blood or clear fluid from skin cracks. With allergic contact dermatitis, there may also be swelling in areas such as the eyelids, mouth, or genitals. How is this  diagnosed? This condition is diagnosed with a medical history and physical exam.  A patch skin test may be performed to help determine the cause.  If the condition is related to your job, you may need to see an occupational medicine specialist. How is this treated? This condition is treated by checking for the cause of the reaction and protecting your skin from further contact. Treatment may also include:  Steroid creams or ointments. Oral steroid medicines may be needed in more severe cases.  Antibiotic medicines or antibacterial ointments, if a skin infection is present.  Antihistamine lotion or an antihistamine taken by mouth to ease itching.  A bandage (dressing). Follow these instructions at home: Skin care  Moisturize your skin as needed.  Apply cool compresses to the affected areas.  Try applying baking soda paste to your skin. Stir water into baking soda until it reaches a paste-like consistency.  Do not scratch your skin, and avoid friction to the affected area.  Avoid the use of soaps, perfumes, and dyes. Medicines  Take or apply over-the-counter and prescription medicines only as told by your health care provider.  If you were prescribed an antibiotic medicine, take or apply the antibiotic as told by your health care provider. Do not stop using the antibiotic even if your condition improves. Bathing  Try taking a bath with: ? Epsom salts. Follow the instructions on the packaging. You can get these at your local pharmacy or grocery store. ? Baking soda. Pour a small amount into the bath as directed by your health care provider. ? Colloidal oatmeal. Follow the instructions on the   packaging. You can get this at your local pharmacy or grocery store.  Bathe less frequently, such as every other day.  Bathe in lukewarm water. Avoid using hot water. Bandage care  If you were given a bandage (dressing), change it as told by your health care provider.  Wash your hands  with soap and water before and after you change your dressing. If soap and water are not available, use hand sanitizer. General instructions  Avoid the substance that caused your reaction. If you do not know what caused it, keep a journal to try to track what caused it. Write down: ? What you eat. ? What cosmetic products you use. ? What you drink. ? What you wear in the affected area. This includes jewelry.  Check the affected areas every day for signs of infection. Check for: ? More redness, swelling, or pain. ? More fluid or blood. ? Warmth. ? Pus or a bad smell.  Keep all follow-up visits as told by your health care provider. This is important. Contact a health care provider if:  Your condition does not improve with treatment.  Your condition gets worse.  You have signs of infection such as swelling, tenderness, redness, soreness, or warmth in the affected area.  You have a fever.  You have new symptoms. Get help right away if:  You have a severe headache, neck pain, or neck stiffness.  You vomit.  You feel very sleepy.  You notice red streaks coming from the affected area.  Your bone or joint underneath the affected area becomes painful after the skin has healed.  The affected area turns darker.  You have difficulty breathing. Summary  Dermatitis is redness, soreness, and swelling (inflammation) of the skin. Contact dermatitis is a reaction to certain substances that touch the skin.  Symptoms of this condition may occur on your body anywhere the irritant has touched you or is touched by you.  This condition is treated by figuring out what caused the reaction and protecting your skin from further contact. Treatment may also include medicines and skin care.  Avoid the substance that caused your reaction. If you do not know what caused it, keep a journal to try to track what caused it.  Contact a health care provider if your condition gets worse or you have signs  of infection such as swelling, tenderness, redness, soreness, or warmth in the affected area. This information is not intended to replace advice given to you by your health care provider. Make sure you discuss any questions you have with your health care provider. Document Revised: 07/24/2018 Document Reviewed: 10/17/2017 Elsevier Patient Education  2020 [employer]  

## 2019-08-05 ENCOUNTER — Other Ambulatory Visit: Payer: Medicare Other

## 2019-08-05 ENCOUNTER — Ambulatory Visit (INDEPENDENT_AMBULATORY_CARE_PROVIDER_SITE_OTHER): Payer: Medicare Other

## 2019-08-05 DIAGNOSIS — R072 Precordial pain: Secondary | ICD-10-CM | POA: Diagnosis not present

## 2019-08-05 DIAGNOSIS — R002 Palpitations: Secondary | ICD-10-CM

## 2019-08-06 NOTE — Progress Notes (Signed)
Patient ID: Patricia Moore, female    DOB: 19-Apr-1933  Age: 86 y.o. MRN: [id number]    Subjective:  Subjective  HPI Patricia Moore presents for rash under both arms    No new lotions, deoderant, soaps etc Rash causes pain and itching No blisters   Review of Systems  Constitutional: Negative for appetite change, diaphoresis, fatigue and unexpected weight change.  Eyes: Negative for pain, redness and visual disturbance.  Respiratory: Negative for cough, chest tightness, shortness of breath and wheezing.   Cardiovascular: Negative for chest pain, palpitations and leg swelling.  Endocrine: Negative for cold intolerance, heat intolerance, polydipsia, polyphagia and polyuria.  Genitourinary: Negative for difficulty urinating, dysuria and frequency.  Skin: Positive for rash.  Neurological: Negative for dizziness, light-headedness, numbness and headaches.    History Past Medical History:  Diagnosis Date  . Anxiety 12/09/2013  . Arthritis   . Cataract   . Diabetes mellitus   . Diverticulitis    patient states has had three attacks   . Diverticulosis   . DJD (degenerative joint disease) 12/14/2014  . Glaucoma   . H. pylori infection 2001  . Heart murmur   . Hx of adenomatous colonic polyps   . Hyperlipidemia   . Hypertension   . Intraductal papilloma    bx nef x 2 in the 70s-90s  . Saccular aneurysm   . SCC (squamous cell carcinoma) 03/2011   sees dermatology    She has a past surgical history that includes Breast biopsy ('91, '95); Breast lumpectomy; Cataract extraction (Bilateral, 09/2014); Closed reduction ulnar shaft (Right, 10/06/2017); and Colonoscopy.   Her family history includes Breast cancer in her maternal aunt and maternal aunt; Cancer in her sister; Colon cancer in her maternal grandfather and paternal uncle; Heart disease (age of onset: 54) in her mother; Heart disease (age of onset: 77) in her father; Stroke in her maternal aunt. She was adopted.She reports that she has  never smoked. She has never used smokeless tobacco. She reports that she does not drink alcohol or use drugs.  Current Outpatient Medications on File Prior to Visit  Medication Sig Dispense Refill  . ALPRAZolam (XANAX) 0.25 MG tablet TAKE 1 TABLET(0.25 MG) BY MOUTH DAILY AS NEEDED 30 tablet 2  . baclofen (LIORESAL) 10 MG tablet Take 1 tablet (10 mg total) by mouth at bedtime as needed for muscle spasms. 30 each 0  . carvedilol (COREG) 25 MG tablet Take 1 tablet (25 mg total) by mouth 2 (two) times daily with a meal. 60 tablet 3  . cholecalciferol (VITAMIN D) 1000 UNITS tablet Take 1,000 Units by mouth daily.    Marland Kitchen co-enzyme Q-10 30 MG capsule Take 30 mg by mouth daily.    . dorzolamide-timolol (COSOPT) 22.3-6.8 MG/ML ophthalmic solution Place 1 drop into both eyes 2 (two) times daily.     . folic acid (FOLVITE) 1 MG tablet Take 1 tablet (1 mg total) by mouth daily. 90 tablet 3  . gabapentin (NEURONTIN) 100 MG capsule Take 1 capsule (100 mg total) by mouth at bedtime. 30 capsule 5  . glucose blood (ONE TOUCH ULTRA TEST) test strip CHECK BLOOD SUGAR NO MORE THAN 2 TIMES DAILY 200 each 12  . glyBURIDE (DIABETA) 2.5 MG tablet Take 1 tablet (2.5 mg total) by mouth daily with breakfast.    . Lancets (ONETOUCH ULTRASOFT) lancets CHECK BLOOD SUGAR NO MORE THAN 2 TIMES DAILY 100 each 12  . metFORMIN (GLUCOPHAGE) 1000 MG tablet TAKE 1 TABLET BY MOUTH  WITH BREAKFAST, 1 TABLET WITH LUNCH AND 1/2 TABLET WITH DINNER 75 tablet 5  . RESTASIS 0.05 % ophthalmic emulsion     . vitamin B-12 (CYANOCOBALAMIN) 250 MCG tablet Take 250 mcg by mouth daily.     No current facility-administered medications on file prior to visit.     Objective:  Objective  Physical Exam Vitals and nursing note reviewed.  Constitutional:      Appearance: She is well-developed.  HENT:     Head: Normocephalic and atraumatic.  Eyes:     Conjunctiva/sclera: Conjunctivae normal.  Neck:     Thyroid: No thyromegaly.     Vascular: No  carotid bruit or JVD.  Cardiovascular:     Rate and Rhythm: Normal rate and regular rhythm.     Heart sounds: Normal heart sounds. No murmur.  Pulmonary:     Effort: Pulmonary effort is normal. No respiratory distress.     Breath sounds: Normal breath sounds. No wheezing or rales.  Chest:     Chest wall: No tenderness.  Musculoskeletal:     Cervical back: Normal range of motion and neck supple.  Skin:    Findings: Erythema and rash present. Rash is macular.       Neurological:     Mental Status: She is alert and oriented to person, place, and time.    BP 120/60 (BP Location: Left Arm, Patient Position: Sitting, Cuff Size: Normal)   Pulse 61   Temp 97.6 F (36.4 C) (Temporal)   Resp 18   Ht 5\' 1"  (1.549 m)   Wt 143 lb 12.8 oz (65.2 kg)   LMP 04/17/1988   SpO2 98%   BMI 27.17 kg/m  Wt Readings from Last 3 Encounters:  08/04/19 143 lb 12.8 oz (65.2 kg)  07/30/19 145 lb 12.8 oz (66.1 kg)  07/28/19 145 lb 8 oz (66 kg)     Lab Results  Component Value Date   WBC 9.6 07/28/2019   HGB 13.4 07/28/2019   HCT 39.8 07/28/2019   PLT 281.0 07/28/2019   GLUCOSE 133 (H) 07/28/2019   CHOL 198 06/12/2019   TRIG 108.0 06/12/2019   HDL 63.10 06/12/2019   LDLDIRECT 131.9 10/28/2010   LDLCALC 113 (H) 06/12/2019   ALT 17 06/12/2019   AST 23 06/12/2019   NA 135 07/28/2019   K 4.5 07/28/2019   CL 98 07/28/2019   CREATININE 0.96 07/28/2019   BUN 21 07/28/2019   CO2 31 07/28/2019   TSH 0.84 07/28/2019   INR 1.07 10/06/2017   HGBA1C 6.7 (H) 06/12/2019   MICROALBUR <0.7 12/13/2018    Exercise Tolerance Test  Result Date: 12/11/2018  There was no ST segment deviation noted during stress.      Assessment & Plan:  Plan  I am having Patricia Moore start on triamcinolone cream. I am also having her maintain her cholecalciferol, co-enzyme Q-10, dorzolamide-timolol, glyBURIDE, Restasis, vitamin B-12, onetouch ultrasoft, ALPRAZolam, glucose blood, metFORMIN, baclofen, folic acid,  gabapentin, and carvedilol.  Meds ordered this encounter  Medications  . triamcinolone cream (KENALOG) 0.1 %    Sig: Apply 1 application topically 2 (two) times daily.    Dispense:  30 g    Refill:  0    Problem List Items Addressed This Visit    None    Visit Diagnoses    Irritant contact dermatitis, unspecified trigger    -  Primary   Relevant Medications   triamcinolone cream (KENALOG) 0.1 %    rto prn  Follow-up: No follow-ups on file.  Donato Schultz, DO

## 2019-08-07 ENCOUNTER — Telehealth: Payer: Self-pay

## 2019-08-07 NOTE — Telephone Encounter (Signed)
Patient called in stating that she is having problems with her medication and would like to speak with the nurse or Dr. Larose Kells please give the patient call at [phone]

## 2019-08-07 NOTE — Telephone Encounter (Signed)
Pt scheduled for appt tomorrow- 08/08/2019. Medications not working for rash.

## 2019-08-08 ENCOUNTER — Ambulatory Visit (INDEPENDENT_AMBULATORY_CARE_PROVIDER_SITE_OTHER): Payer: Medicare Other | Admitting: Internal Medicine

## 2019-08-08 ENCOUNTER — Other Ambulatory Visit: Payer: Self-pay

## 2019-08-08 ENCOUNTER — Encounter: Payer: Self-pay | Admitting: Internal Medicine

## 2019-08-08 VITALS — BP 160/73 | HR 75 | Temp 96.9°F | Resp 18 | Ht 61.0 in | Wt 146.0 lb

## 2019-08-08 DIAGNOSIS — R21 Rash and other nonspecific skin eruption: Secondary | ICD-10-CM | POA: Diagnosis not present

## 2019-08-08 MED ORDER — BETAMETHASONE DIPROPIONATE AUG 0.05 % EX CREA: TOPICAL_CREAM | Freq: Two times a day (BID) | CUTANEOUS | 0 refills | Status: DC

## 2019-08-08 NOTE — Progress Notes (Signed)
Subjective:    Patient ID: Patricia Moore, female    DOB: 05/05/1933, 86 y.o.   MRN: [id number]  DOS:  08/08/2019 Type of visit - description: acute  Symptoms started shortly after the last visit with me 07/28/2019. 08/04/2019, was seen by Dr. Elbert Ewings, rash under both arms, Rx triamcinolone. No better. Rash is on both under arms, worse on the right. Extremely pruritic, minimal pain, no blisters.  Denies the use of new perfume or deodorant. She does shave the area regularly and has not changed soaps No fever chills  BP Readings from Last 3 Encounters:  08/08/19 (!) 160/73  08/04/19 120/60  07/30/19 (!) 142/70     Review of Systems See above   Past Medical History:  Diagnosis Date  . Anxiety 12/09/2013  . Arthritis   . Cataract   . Diabetes mellitus   . Diverticulitis    patient states has had three attacks   . Diverticulosis   . DJD (degenerative joint disease) 12/14/2014  . Glaucoma   . H. pylori infection 2001  . Heart murmur   . Hx of adenomatous colonic polyps   . Hyperlipidemia   . Hypertension   . Intraductal papilloma    bx nef x 2 in the 70s-90s  . Saccular aneurysm   . SCC (squamous cell carcinoma) 03/2011   sees dermatology    Past Surgical History:  Procedure Laterality Date  . BREAST BIOPSY  '91, '95   INTRADUCTAL PAPILLOMA  . BREAST LUMPECTOMY    . CATARACT EXTRACTION Bilateral 09/2014  . CLOSED REDUCTION ULNAR SHAFT Right 10/06/2017   Procedure: CLOSED REDUCTION DISLOCATED RIGHT SHOULDER;  Surgeon: Kathryne Hitch, MD;  Location: [facility] OR;  Service: Orthopedics;  Laterality: Right;  . COLONOSCOPY      Allergies as of 08/08/2019   No Known Allergies     Medication List       Accurate as of August 08, 2019 11:59 PM. If you have any questions, ask your nurse or doctor.        ALPRAZolam 0.25 MG tablet Commonly known as: XANAX TAKE 1 TABLET(0.25 MG) BY MOUTH DAILY AS NEEDED   augmented betamethasone dipropionate 0.05 % cream Commonly known  as: DIPROLENE-AF Apply topically 2 (two) times daily. Started by: Willow Ora, MD   baclofen 10 MG tablet Commonly known as: LIORESAL Take 1 tablet (10 mg total) by mouth at bedtime as needed for muscle spasms.   carvedilol 25 MG tablet Commonly known as: COREG Take 1 tablet (25 mg total) by mouth 2 (two) times daily with a meal.   cholecalciferol 1000 units tablet Commonly known as: VITAMIN D Take 1,000 Units by mouth daily.   co-enzyme Q-10 30 MG capsule Take 30 mg by mouth daily.   dorzolamide-timolol 22.3-6.8 MG/ML ophthalmic solution Commonly known as: COSOPT Place 1 drop into both eyes 2 (two) times daily.   folic acid 1 MG tablet Commonly known as: FOLVITE Take 1 tablet (1 mg total) by mouth daily.   gabapentin 100 MG capsule Commonly known as: Neurontin Take 1 capsule (100 mg total) by mouth at bedtime.   glucose blood test strip Commonly known as: ONE TOUCH ULTRA TEST CHECK BLOOD SUGAR NO MORE THAN 2 TIMES DAILY   glyBURIDE 2.5 MG tablet Commonly known as: DIABETA Take 1 tablet (2.5 mg total) by mouth daily with breakfast.   metFORMIN 1000 MG tablet Commonly known as: GLUCOPHAGE TAKE 1 TABLET BY MOUTH WITH BREAKFAST, 1 TABLET WITH LUNCH AND 1/2  TABLET WITH DINNER   onetouch ultrasoft lancets CHECK BLOOD SUGAR NO MORE THAN 2 TIMES DAILY   Restasis 0.05 % ophthalmic emulsion Generic drug: cycloSPORINE   triamcinolone cream 0.1 % Commonly known as: KENALOG Apply 1 application topically 2 (two) times daily.   vitamin B-12 250 MCG tablet Commonly known as: CYANOCOBALAMIN Take 250 mcg by mouth daily.          Objective:   Physical Exam BP (!) 160/73 (BP Location: Left Arm, Patient Position: Sitting, Cuff Size: Small)   Pulse 75   Temp (!) 96.9 F (36.1 C) (Temporal)   Resp 18   Ht 5\' 1"  (1.549 m)   Wt 146 lb (66.2 kg)   LMP 04/17/1988   SpO2 97%   BMI 27.59 kg/m  General:   Well developed, NAD, BMI noted. HEENT:  Normocephalic . Face  symmetric, atraumatic Skin:  Axillary rash, R>>L, macular, confluent patches, pink in color, no blisters, not scaly. Neurologic:  alert & oriented X3.  Speech normal, gait appropriate for age and unassisted Psych--  Cognition and judgment appear intact.  Cooperative with normal attention span and concentration.  Behavior appropriate. No anxious or depressed appearing.      Assessment    Assessment  DM HTN -Losartan d/c d/t hyperkalemia -Amlodipine d/c d/t edema 07-2017 - HCTZ d/c d/t hyperkalemia 08/2017 Hyperlipidemia -- intolerant to lipitor and crestor (severe cramps).  Declined further trial with medications  on 01/16/2018   Anxiety- xanax rx by pcp Vertigo - valium prn GI: --Recurrent diverticulitis (one episode documented by a CT 11-2014) --H. pylori 2001 DJD--back pain, knees, neck pain ( on baclofen for neck) Osteoporosis : Tscore -1.9 on 11-2015 , T score  -1.8 (03/2019); h/o  R ulnar FX 2019 SCC, skin cancer Elevated homocysteine Raynaud's phenomena  NEURO --Carotid US 10-2013: No significant plaque, although ICA's are serpentine, bilaterally. 40-59% bilateral ICA stenosis by velocity criteria likely due to tortuosity. Patent vertebral arteries with antegrade flow.Normal subclavian arteries, bilaterally. 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 Recommend avoid shaving, deodorants. Change to stronger topical steroids:  Diprolene. Palpitations, chest pain: Saw cardiology, Rx a event monitor.  This visit occurred during the SARS-CoV-2 public health emergency.  Safety protocols were in place, including screening questions prior to the visit, additional usage of staff PPE, and extensive cleaning of exam room while observing appropriate contact time as indicated for disinfecting solutions.

## 2019-08-08 NOTE — Progress Notes (Signed)
Pre visit review using our clinic review tool, if applicable. No additional management support is needed unless otherwise documented below in the visit note. 

## 2019-08-08 NOTE — Patient Instructions (Signed)
Use a new cream called Augmentin betamethasone (Diprolene) twice a day for 1 week  No shaving  No deodorants  Call if not gradually better

## 2019-08-10 NOTE — Assessment & Plan Note (Signed)
Rash: Nonspecific, likely atopic versus contact dermatitis versus simply chemical irritation from shaving. Recommend avoid shaving, deodorants. Change to stronger topical steroids: Diprolene. Palpitations, chest pain: Saw cardiology, Rx a event monitor.

## 2019-08-17 ENCOUNTER — Other Ambulatory Visit: Payer: Self-pay | Admitting: Internal Medicine

## 2019-08-19 ENCOUNTER — Other Ambulatory Visit: Payer: Self-pay

## 2019-08-22 DIAGNOSIS — H401131 Primary open-angle glaucoma, bilateral, mild stage: Secondary | ICD-10-CM | POA: Diagnosis not present

## 2019-08-22 DIAGNOSIS — H02401 Unspecified ptosis of right eyelid: Secondary | ICD-10-CM | POA: Diagnosis not present

## 2019-08-22 DIAGNOSIS — H26492 Other secondary cataract, left eye: Secondary | ICD-10-CM | POA: Diagnosis not present

## 2019-08-22 DIAGNOSIS — H02831 Dermatochalasis of right upper eyelid: Secondary | ICD-10-CM | POA: Diagnosis not present

## 2019-08-25 ENCOUNTER — Ambulatory Visit
Admission: RE | Admit: 2019-08-25 | Discharge: 2019-08-25 | Disposition: A | Discharge status: Home / Self Care | Payer: Medicare Other | Source: Ambulatory Visit | Attending: Neurology | Admitting: Neurology

## 2019-08-25 ENCOUNTER — Other Ambulatory Visit: Payer: Self-pay

## 2019-08-25 DIAGNOSIS — M4722 Other spondylosis with radiculopathy, cervical region: Secondary | ICD-10-CM

## 2019-08-25 DIAGNOSIS — I671 Cerebral aneurysm, nonruptured: Secondary | ICD-10-CM | POA: Diagnosis not present

## 2019-08-25 DIAGNOSIS — H8110 Benign paroxysmal vertigo, unspecified ear: Secondary | ICD-10-CM

## 2019-09-04 DIAGNOSIS — R002 Palpitations: Secondary | ICD-10-CM | POA: Diagnosis not present

## 2019-09-08 ENCOUNTER — Telehealth: Payer: Self-pay | Admitting: Internal Medicine

## 2019-09-08 NOTE — Progress Notes (Signed)
Chronic Care Management   Note  09/08/2019 Name: Patricia Moore MRN: [id number] DOB: [birth year]  Patricia Moore is a 86 y.o. year old female who is a primary care patient of Drue Novel, Nolon Rod, MD. I reached out to Lynford Humphrey by phone today in response to a referral sent by Ms. Patricia Moore's PCP, Wanda Plump, MD.   Patricia Moore was given information about Chronic Care Management services today including:  1. CCM service includes personalized support from designated clinical staff supervised by her physician, including individualized plan of care and coordination with other care providers 2. 24/7 contact phone numbers for assistance for urgent and routine care needs. 3. Service will only be billed when office clinical staff spend 20 minutes or more in a month to coordinate care. 4. Only one practitioner may furnish and bill the service in a calendar month. 5. The patient may stop CCM services at any time (effective at the end of the month) by phone call to the office staff.   Patient agreed to services and verbal consent obtained.  This note is not being shared with the patient for the following reason: To respect privacy (The patient or proxy has requested that the information not be shared). Follow up plan:   Lynnae January Upstream Scheduler

## 2019-09-08 NOTE — Progress Notes (Signed)
Subjective:   Patricia Moore is a 86 y.o. female who presents for Medicare Annual preventive examination.  Review of Systems:  Cardiac Risk Factors include: advanced age (>99men, >32 women);diabetes mellitus;hypertension Home Safety/Smoke Alarms: Feels safe in home. Smoke alarms in place.  Lives w/ husband. 1 level.   Female:        Mammo- 03/07/19      Dexa scan-03/26/19             Objective:     Vitals: BP 132/80 (BP Location: Left Arm, Patient Position: Sitting, Cuff Size: Normal)   Pulse 79   Temp (!) 97 F (36.1 C) (Temporal)   Ht 5\' 1"  (1.549 m)   Wt 136 lb 12.8 oz (62.1 kg)   LMP 04/17/1988   SpO2 98%   BMI 25.85 kg/m   Body mass index is 25.85 kg/m.  Advanced Directives 09/09/2019 07/07/2019 03/25/2018 12/03/2017 10/08/2017 07/30/2015 10/02/2013  Does Patient Have a Medical Advance Directive? No;Yes Yes Yes Yes Yes No Patient has advance directive, copy not in chart  Type of Advance Directive [place];Living will - [place];Living will Living will;Healthcare Power of Attorney Living will - Living will;Healthcare Power of Attorney  Does patient want to make changes to medical advance directive? No - Patient declined - - No - Patient declined No - Patient declined - No change requested  Copy of [place] in Chart? No - copy requested - No - copy requested No - copy requested - - -  Would patient like information on creating a medical advance directive? - - - - - No - patient declined information -  Pre-existing out of facility DNR order (yellow form or pink MOST form) - - - - - - No    Tobacco Social History   Tobacco Use  Smoking Status Never Smoker  Smokeless Tobacco Never Used     Counseling given: Not Answered   Clinical Intake: Pain : No/denies pain     Past Medical History:  Diagnosis Date  . Anxiety 12/09/2013  . Arthritis   . Cataract   . Diabetes mellitus   . Diverticulitis    patient  states has had three attacks   . Diverticulosis   . DJD (degenerative joint disease) 12/14/2014  . Glaucoma   . H. pylori infection 2001  . Heart murmur   . Hx of adenomatous colonic polyps   . Hyperlipidemia   . Hypertension   . Intraductal papilloma    bx nef x 2 in the 70s-90s  . Saccular aneurysm   . SCC (squamous cell carcinoma) 03/2011   sees dermatology   Past Surgical History:  Procedure Laterality Date  . BREAST BIOPSY  '91, '95   INTRADUCTAL PAPILLOMA  . BREAST LUMPECTOMY    . CATARACT EXTRACTION Bilateral 09/2014  . CLOSED REDUCTION ULNAR SHAFT Right 10/06/2017   Procedure: CLOSED REDUCTION DISLOCATED RIGHT SHOULDER;  Surgeon: Mcarthur Rossetti, MD;  Location: [facility];  Service: Orthopedics;  Laterality: Right;  . COLONOSCOPY     Family History  Adopted: Yes  Problem Relation Age of Onset  . Heart disease Mother 62  . Heart disease Father 61  . Breast cancer Maternal Aunt   . Stroke Maternal Aunt   . Colon cancer Paternal Uncle        7 uncles  . Colon cancer Maternal Grandfather        COLON,, FAMILY HX 7 MEMBERS  . Breast cancer  Maternal Aunt   . Cancer Sister        skin  . Hypertension Neg Hx   . Diabetes Neg Hx   . Esophageal cancer Neg Hx   . Rectal cancer Neg Hx   . Stomach cancer Neg Hx    Social History   Socioeconomic History  . Marital status: Married    Spouse name: Not on file  . Number of children: 1  . Years of education: Not on file  . Highest education level: Not on file  Occupational History  . 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 Smoking status: Never Smoker  . Smokeless tobacco: Never Used  Substance and Sexual Activity  . Alcohol use: Never  . Drug use: No  . Sexual activity: Not Currently  Other Topics Concern  . 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 Difficulty of Paying Living Expenses: Not hard at all  Food Insecurity: No Food Insecurity  . Worried About Charity fundraiser in the Last Year: Never true  . Ran Out of Food in the Last Year: Never true  Transportation Needs: No Transportation Needs  . Lack of Transportation (Medical): No  . Lack of Transportation (Non-Medical): No  Physical Activity:   . Days of Exercise per Week:   . Minutes of Exercise per Session:   Stress:   . Feeling of Stress :   Social Connections:   . Frequency of Communication with Friends and Family:   . Frequency of Social Gatherings with Friends and Family:   . Attends Religious Services:   . Active Member of Clubs or Organizations:   . Attends Archivist Meetings:   Marland Kitchen Marital Status:     Outpatient Encounter Medications as of 09/09/2019  Medication Sig  . ALPRAZolam (XANAX) 0.25 MG tablet TAKE 1 TABLET(0.25 MG) BY MOUTH DAILY AS NEEDED  . augmented betamethasone dipropionate (DIPROLENE-AF) 0.05 % cream Apply topically 2 (two) times daily.  . baclofen (LIORESAL) 10 MG tablet Take 1 tablet (10 mg total) by mouth at bedtime as needed for muscle spasms.  . carvedilol (COREG) 25 MG tablet Take 1 tablet (25 mg total) by mouth 2 (two) times daily with a meal.  . cholecalciferol (VITAMIN D) 1000 UNITS tablet Take 1,000 Units by mouth daily.  Marland Kitchen co-enzyme Q-10 30 MG capsule Take 30 mg by mouth daily.  . dorzolamide-timolol (COSOPT) 22.3-6.8 MG/ML ophthalmic solution Place 1 drop into both eyes 2 (two) times daily.   . folic acid (FOLVITE) 1 MG tablet Take 1 tablet (1 mg total) by mouth daily.  Marland Kitchen gabapentin (NEURONTIN) 100 MG capsule Take 1 capsule (100 mg total) by mouth at bedtime.  Marland Kitchen glucose blood (ONE TOUCH ULTRA TEST) test strip CHECK BLOOD SUGAR NO MORE THAN 2 TIMES DAILY  . glyBURIDE (DIABETA) 2.5 MG tablet Take 1 tablet (2.5 mg total) by mouth daily with breakfast.  . Lancets  (ONETOUCH ULTRASOFT) lancets CHECK BLOOD SUGAR NO MORE THAN 2 TIMES DAILY  . metFORMIN (GLUCOPHAGE) 1000 MG tablet TAKE 1 TABLET BY MOUTH WITH BREAKFAST, 1 TABLET WITH LUNCH AND 1/2 TABLET WITH DINNER  . RESTASIS 0.05 % ophthalmic emulsion   .  triamcinolone cream (KENALOG) 0.1 % Apply 1 application topically 2 (two) times daily.  . vitamin B-12 (CYANOCOBALAMIN) 250 MCG tablet Take 250 mcg by mouth daily.   No facility-administered encounter medications on file as of 09/09/2019.    Activities of Daily Living In your present state of health, do you have any difficulty performing the following activities: 09/09/2019 12/13/2018  Hearing? N N  Vision? N N  Difficulty concentrating or making decisions? N N  Walking or climbing stairs? N N  Dressing or bathing? N N  Doing errands, shopping? N N  Preparing Food and eating ? N -  Using the Toilet? N -  In the past six months, have you accidently leaked urine? N -  Do you have problems with loss of bowel control? N -  Managing your Medications? N -  Managing your Finances? N -  Housekeeping or managing your Housekeeping? N -  Some recent data might be hidden    Patient Care Team: Colon Branch, MD as PCP - General (Internal Medicine) Minus Breeding, MD as PCP - Cardiology (Cardiology) Danella Sensing, MD as Consulting Physician (Dermatology) Suella Broad, MD as Consulting Physician (Physical Medicine and Rehabilitation) Pieter Partridge, DO as Consulting Physician (Neurology) Princess Bruins, MD as Consulting Physician (Obstetrics and Gynecology) Irene Shipper, MD as Consulting Physician (Gastroenterology) Leonia Corona, MD as Referring Physician (Ophthalmology) Day, Melvenia Beam, [facility] as Pharmacist (Pharmacist)    Assessment:   This is a routine wellness examination for Glessie. Physical assessment deferred to PCP.  Exercise Activities and Dietary recommendations Current Exercise Habits: Home exercise routine, Type of exercise: walking, Time  (Minutes): 60, Frequency (Times/Week): 7, Weekly Exercise (Minutes/Week): 420, Intensity: Mild, Exercise limited by: None identified Diet (meal preparation, eat out, water intake, caffeinated beverages, dairy products, fruits and vegetables): in general, a "healthy" diet  , well balanced   Goals    . Maintain healthy active lifestyle.       Fall Risk Fall Risk  09/09/2019 07/07/2019 12/13/2018 06/07/2018 02/25/2018  Falls in the past year? 0 0 1 1 0  Number falls in past yr: 0 0 0 0 0  Injury with Fall? - 0 0 0 0  Follow up Education provided;Falls prevention discussed - Falls evaluation completed Falls evaluation completed Falls evaluation completed   Depression Screen PHQ 2/9 Scores 09/09/2019 12/13/2018 05/28/2018 05/15/2017  PHQ - 2 Score 0 0 0 0     Cognitive Function Ad8 score reviewed for issues:  Issues making decisions:no  Less interest in hobbies / activities:no  Repeats questions, stories (family complaining):no  Trouble using ordinary gadgets (microwave, computer, phone):no  Forgets the month or year: no  Mismanaging finances: no  Remembering appts:no  Daily problems with thinking and/or memory:no Ad8 score is=0          Immunization History  Administered Date(s) Administered  . Fluad Quad(high Dose 65+) 12/13/2018  . Influenza Split 01/27/2011, 03/04/2012, 05/14/2014  . Influenza Whole 01/16/2008, 03/18/2009  . Influenza, High Dose Seasonal PF 12/29/2015, 12/25/2016, 01/11/2018  . Influenza,inj,Quad PF,6+ Mos 01/08/2013, 12/15/2014  . PFIZER SARS-COV-2 Vaccination 05/01/2019, 05/19/2019  . Pneumococcal Conjugate-13 02/15/2015  . Pneumococcal Polysaccharide-23 03/17/2005  . Td 03/17/2006  . Tdap 10/06/2017  . Zoster 02/01/2010     Screening Tests Health Maintenance  Topic Date Due  . INFLUENZA VACCINE  11/16/2019  . HEMOGLOBIN A1C  12/10/2019  . FOOT EXAM  12/13/2019  . URINE MICROALBUMIN  12/13/2019  . OPHTHALMOLOGY EXAM  07/10/2020  .  TETANUS/TDAP  10/07/2027  . DEXA SCAN  Completed  . COVID-19 Vaccine  Completed  . PNA vac Low Risk Adult  Completed      Plan:    Please schedule your next medicare wellness visit with me in 1 yr.  Continue to eat heart healthy diet (full of fruits, vegetables, whole grains, lean protein, water--limit salt, fat, and sugar intake) and increase physical activity as tolerated.  Continue doing brain stimulating activities (puzzles, reading, adult coloring books, staying active) to keep memory sharp.   Bring a copy of your living will and/or healthcare power of attorney to your next office visit.   I have personally reviewed and noted the following in the patient's chart:   . Medical and social history . Use of alcohol, tobacco or illicit drugs  . Current medications and supplements . Functional ability and status . Nutritional status . Physical activity . Advanced directives . List of other physicians . Hospitalizations, surgeries, and ER visits in previous 12 months . Vitals . Screenings to include cognitive, depression, and falls . Referrals and appointments  In addition, I have reviewed and discussed with patient certain preventive protocols, quality metrics, and best practice recommendations. A written personalized care plan for preventive services as well as general preventive health recommendations were provided to patient.     Naaman Plummer [place], [place]  09/09/2019

## 2019-09-09 ENCOUNTER — Encounter: Payer: Self-pay | Admitting: Internal Medicine

## 2019-09-09 ENCOUNTER — Encounter: Payer: Self-pay | Admitting: *Deleted

## 2019-09-09 ENCOUNTER — Ambulatory Visit (INDEPENDENT_AMBULATORY_CARE_PROVIDER_SITE_OTHER): Payer: Medicare Other | Admitting: Internal Medicine

## 2019-09-09 ENCOUNTER — Ambulatory Visit (INDEPENDENT_AMBULATORY_CARE_PROVIDER_SITE_OTHER): Payer: Medicare Other | Admitting: *Deleted

## 2019-09-09 ENCOUNTER — Other Ambulatory Visit: Payer: Self-pay

## 2019-09-09 VITALS — BP 132/80 | HR 79 | Temp 97.0°F | Ht 61.0 in | Wt 132.8 lb

## 2019-09-09 VITALS — BP 132/80 | HR 79 | Temp 97.0°F | Ht 61.0 in | Wt 136.8 lb

## 2019-09-09 DIAGNOSIS — R002 Palpitations: Secondary | ICD-10-CM | POA: Diagnosis not present

## 2019-09-09 DIAGNOSIS — Z0000 Encounter for general adult medical examination without abnormal findings: Secondary | ICD-10-CM | POA: Diagnosis not present

## 2019-09-09 DIAGNOSIS — E119 Type 2 diabetes mellitus without complications: Secondary | ICD-10-CM

## 2019-09-09 DIAGNOSIS — M8949 Other hypertrophic osteoarthropathy, multiple sites: Secondary | ICD-10-CM | POA: Diagnosis not present

## 2019-09-09 DIAGNOSIS — M159 Polyosteoarthritis, unspecified: Secondary | ICD-10-CM

## 2019-09-09 MED ORDER — GLYBURIDE 2.5 MG PO TABS: 2.5000 mg | ORAL_TABLET | Freq: Every day | ORAL | 1 refills | Status: DC

## 2019-09-09 MED ORDER — BACLOFEN 10 MG PO TABS: 10.0000 mg | ORAL_TABLET | Freq: Every evening | ORAL | 3 refills | Status: DC | PRN

## 2019-09-09 NOTE — Progress Notes (Signed)
Subjective:    Patient ID: Patricia Moore, female    DOB: February 19, 1934, 86 y.o.   MRN: [id number]  DOS:  09/09/2019 Type of visit - description: rov Since the last office visit she is doing well. Currently with no chest pain, difficulty breathing or palpitations. We also talk about DJD and diabetes.    Review of Systems See above   Past Medical History:  Diagnosis Date  . Anxiety 12/09/2013  . Arthritis   . Cataract   . Diabetes mellitus   . Diverticulitis    patient states has had three attacks   . Diverticulosis   . DJD (degenerative joint disease) 12/14/2014  . Glaucoma   . H. pylori infection 2001  . Heart murmur   . Hx of adenomatous colonic polyps   . Hyperlipidemia   . Hypertension   . Intraductal papilloma    bx nef x 2 in the 70s-90s  . Saccular aneurysm   . SCC (squamous cell carcinoma) 03/2011   sees dermatology    Past Surgical History:  Procedure Laterality Date  . BREAST BIOPSY  '91, '95   INTRADUCTAL PAPILLOMA  . BREAST LUMPECTOMY    . CATARACT EXTRACTION Bilateral 09/2014  . CLOSED REDUCTION ULNAR SHAFT Right 10/06/2017   Procedure: CLOSED REDUCTION DISLOCATED RIGHT SHOULDER;  Surgeon: Kathryne Hitch, MD;  Location: [facility];  Service: Orthopedics;  Laterality: Right;  . COLONOSCOPY      Allergies as of 09/09/2019   No Known Allergies     Medication List       Accurate as of Sep 09, 2019  2:09 PM. If you have any questions, ask your nurse or doctor.        ALPRAZolam 0.25 MG tablet Commonly known as: XANAX TAKE 1 TABLET(0.25 MG) BY MOUTH DAILY AS NEEDED   augmented betamethasone dipropionate 0.05 % cream Commonly known as: DIPROLENE-AF Apply topically 2 (two) times daily.   baclofen 10 MG tablet Commonly known as: LIORESAL Take 1 tablet (10 mg total) by mouth at bedtime as needed for muscle spasms.   carvedilol 25 MG tablet Commonly known as: COREG Take 1 tablet (25 mg total) by mouth 2 (two) times daily with a meal.     cholecalciferol 1000 units tablet Commonly known as: VITAMIN D Take 1,000 Units by mouth daily.   co-enzyme Q-10 30 MG capsule Take 30 mg by mouth daily.   dorzolamide-timolol 22.3-6.8 MG/ML ophthalmic solution Commonly known as: COSOPT Place 1 drop into both eyes 2 (two) times daily.   folic acid 1 MG tablet Commonly known as: FOLVITE Take 1 tablet (1 mg total) by mouth daily.   gabapentin 100 MG capsule Commonly known as: Neurontin Take 1 capsule (100 mg total) by mouth at bedtime.   glucose blood test strip Commonly known as: ONE TOUCH ULTRA TEST CHECK BLOOD SUGAR NO MORE THAN 2 TIMES DAILY   glyBURIDE 2.5 MG tablet Commonly known as: DIABETA Take 1 tablet (2.5 mg total) by mouth daily with breakfast.   metFORMIN 1000 MG tablet Commonly known as: GLUCOPHAGE TAKE 1 TABLET BY MOUTH WITH BREAKFAST, 1 TABLET WITH LUNCH AND 1/2 TABLET WITH DINNER   onetouch ultrasoft lancets CHECK BLOOD SUGAR NO MORE THAN 2 TIMES DAILY   Restasis 0.05 % ophthalmic emulsion Generic drug: cycloSPORINE   triamcinolone cream 0.1 % Commonly known as: KENALOG Apply 1 application topically 2 (two) times daily.   vitamin B-12 250 MCG tablet Commonly known as: CYANOCOBALAMIN Take 250 mcg by mouth  daily.          Objective:   Physical Exam BP 132/80 (BP Location: Left Arm, Patient Position: Sitting, Cuff Size: Normal)   Pulse 79   Temp (!) 97 F (36.1 C) (Temporal)   Ht 5\' 1"  (1.549 m)   Wt 132 lb 12 oz (60.2 kg)   LMP 04/17/1988   SpO2 98%   BMI 25.08 kg/m  General:   Well developed, NAD, BMI noted. HEENT:  Normocephalic . Face symmetric, atraumatic Lungs:  CTA B Normal respiratory effort, no intercostal retractions, no accessory muscle use. Heart: RRR,  no murmur.  Lower extremities: no pretibial edema bilaterally  Skin: Not pale. Not jaundice Neurologic:  alert & oriented X3.  Speech normal, gait appropriate for age and unassisted Psych--  Cognition and judgment  appear intact.  Cooperative with normal attention span and concentration.  Behavior appropriate. No anxious or depressed appearing.      Assessment     Assessment  DM HTN -Losartan d/c d/t hyperkalemia -Amlodipine d/c d/t edema 07-2017 - HCTZ d/c d/t hyperkalemia 08/2017 Hyperlipidemia -- intolerant to lipitor and crestor (severe cramps).  Declined further trial with medications  on 01/16/2018   Anxiety- xanax rx by pcp Vertigo - valium prn GI: --Recurrent diverticulitis (one episode documented by a CT 11-2014) --H. pylori 2001 DJD--back pain, knees, neck pain ( on baclofen for neck) Osteoporosis : Tscore -1.9 on 11-2015 , T score  -1.8 (03/2019); h/o  R ulnar FX 2019 SCC, skin cancer Elevated homocysteine Raynaud's phenomena  NEURO --Carotid US 10-2013: No significant plaque, although ICA's are serpentine, bilaterally. 40-59% bilateral ICA stenosis by velocity criteria likely due to tortuosity. Patent vertebral arteries with antegrade flow.Normal subclavian arteries, bilaterally. 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  Currently asymptomatic. DM: Currently on Metformin, glyburide.  She was off glyburide for a while, CBGs went up to the 200s. Slightly better now.  Check a A1c. DJD: Has occasionally neck and back pain, request a baclofen refill.  Will do. Elevated homocystine: Good compliance with folic acid, recheck levels on RTC RTC 3 to 4  months   This visit occurred during the SARS-CoV-2 public health emergency.  Safety protocols were in place, including screening questions prior to the visit, additional usage of staff PPE, and extensive cleaning of exam room while observing appropriate contact time as indicated for disinfecting solutions.

## 2019-09-09 NOTE — Patient Instructions (Signed)
Please schedule your next medicare wellness visit with me in 1 yr.  Continue to eat heart healthy diet (full of fruits, vegetables, whole grains, lean protein, water--limit salt, fat, and sugar intake) and increase physical activity as tolerated.  Continue doing brain stimulating activities (puzzles, reading, adult coloring books, staying active) to keep memory sharp.   Bring a copy of your living will and/or healthcare power of attorney to your next office visit.   Patricia Moore , Thank you for taking time to come for your Medicare Wellness Visit. I appreciate your ongoing commitment to your health goals. Please review the following plan we discussed and let me know if I can assist you in the future.   These are the goals we discussed: Goals    . Maintain healthy active lifestyle.       This is a list of the screening recommended for you and due dates:  Health Maintenance  Topic Date Due  . Flu Shot  11/16/2019  . Hemoglobin A1C  12/10/2019  . Complete foot exam   12/13/2019  . Urine Protein Check  12/13/2019  . Eye exam for diabetics  07/10/2020  . Tetanus Vaccine  10/07/2027  . DEXA scan (bone density measurement)  Completed  . COVID-19 Vaccine  Completed  . Pneumonia vaccines  Completed    Preventive Care 50 Years and Older, Female Preventive care refers to lifestyle choices and visits with your health care provider that can promote health and wellness. This includes:  A yearly physical exam. This is also called an annual well check.  Regular dental and eye exams.  Immunizations.  Screening for certain conditions.  Healthy lifestyle choices, such as diet and exercise. What can I expect for my preventive care visit? Physical exam Your health care provider will check:  Height and weight. These may be used to calculate body mass index (BMI), which is a measurement that tells if you are at a healthy weight.  Heart rate and blood pressure.  Your skin for abnormal  spots. Counseling Your health care provider may ask you questions about:  Alcohol, tobacco, and drug use.  Emotional well-being.  Home and relationship well-being.  Sexual activity.  Eating habits.  History of falls.  Memory and ability to understand (cognition).  Work and work Statistician.  Pregnancy and menstrual history. What immunizations do I need?  Influenza (flu) vaccine  This is recommended every year. Tetanus, diphtheria, and pertussis (Tdap) vaccine  You may need a Td booster every 10 years. Varicella (chickenpox) vaccine  You may need this vaccine if you have not already been vaccinated. Zoster (shingles) vaccine  You may need this after age 58. Pneumococcal conjugate (PCV13) vaccine  One dose is recommended after age 85. Pneumococcal polysaccharide (PPSV23) vaccine  One dose is recommended after age 16. Measles, mumps, and rubella (MMR) vaccine  You may need at least one dose of MMR if you were born in 1957 or later. You may also need a second dose. Meningococcal conjugate (MenACWY) vaccine  You may need this if you have certain conditions. Hepatitis A vaccine  You may need this if you have certain conditions or if you travel or work in places where you may be exposed to hepatitis A. Hepatitis B vaccine  You may need this if you have certain conditions or if you travel or work in places where you may be exposed to hepatitis B. Haemophilus influenzae type b (Hib) vaccine  You may need this if you have certain conditions.  You may receive vaccines as individual doses or as more than one vaccine together in one shot (combination vaccines). Talk with your health care provider about the risks and benefits of combination vaccines. What tests do I need? Blood tests  Lipid and cholesterol levels. These may be checked every 5 years, or more frequently depending on your overall health.  Hepatitis C test.  Hepatitis B test. Screening  Lung cancer  screening. You may have this screening every year starting at age 58 if you have a 30-pack-year history of smoking and currently smoke or have quit within the past 15 years.  Colorectal cancer screening. All adults should have this screening starting at age 36 and continuing until age 89. Your health care provider may recommend screening at age 75 if you are at increased risk. You will have tests every 1-10 years, depending on your results and the type of screening test.  Diabetes screening. This is done by checking your blood sugar (glucose) after you have not eaten for a while (fasting). You may have this done every 1-3 years.  Mammogram. This may be done every 1-2 years. Talk with your health care provider about how often you should have regular mammograms.  BRCA-related cancer screening. This may be done if you have a family history of breast, ovarian, tubal, or peritoneal cancers. Other tests  Sexually transmitted disease (STD) testing.  Bone density scan. This is done to screen for osteoporosis. You may have this done starting at age 41. Follow these instructions at home: Eating and drinking  Eat a diet that includes fresh fruits and vegetables, whole grains, lean protein, and low-fat dairy products. Limit your intake of foods with high amounts of sugar, saturated fats, and salt.  Take vitamin and mineral supplements as recommended by your health care provider.  Do not drink alcohol if your health care provider tells you not to drink.  If you drink alcohol: ? Limit how much you have to 0-1 drink a day. ? Be aware of how much alcohol is in your drink. In the [place] one drink equals one 12 oz bottle of beer (355 mL), one 5 oz glass of wine (148 mL), or one 1 oz glass of hard liquor (44 mL). Lifestyle  Take daily care of your teeth and gums.  Stay active. Exercise for at least 30 minutes on 5 or more days each week.  Do not use any products that contain nicotine or tobacco, such as  cigarettes, e-cigarettes, and chewing tobacco. If you need help quitting, ask your health care provider.  If you are sexually active, practice safe sex. Use a condom or other form of protection in order to prevent STIs (sexually transmitted infections).  Talk with your health care provider about taking a low-dose aspirin or statin. What's next?  Go to your health care provider once a year for a well check visit.  Ask your health care provider how often you should have your eyes and teeth checked.  Stay up to date on all vaccines. This information is not intended to replace advice given to you by your health care provider. Make sure you discuss any questions you have with your health care provider. Document Revised: 03/28/2018 Document Reviewed: 03/28/2018 Elsevier Patient Education  2020 [employer].

## 2019-09-09 NOTE — Patient Instructions (Addendum)
Continue checking your blood pressures BP GOAL is between 110/65 and  135/85. If it is consistently higher or lower, let me know    GO TO THE LAB : Get the blood work     [place], [place] back for   a checkup in 3 to 4 months

## 2019-09-09 NOTE — Progress Notes (Signed)
Pre visit review using our clinic review tool, if applicable. No additional management support is needed unless otherwise documented below in the visit note. 

## 2019-09-10 LAB — HEMOGLOBIN A1C: Hgb A1c MFr Bld: 6.9 % — ABNORMAL HIGH (ref 4.6–6.5)

## 2019-09-10 NOTE — Assessment & Plan Note (Signed)
Palpitations, chest pain: Had a 2-week monitor, it showed rare PVCs, cardiology recommended no treatment.  Currently asymptomatic. DM: Currently on Metformin, glyburide.  She was off glyburide for a while, CBGs went up to the 200s. Slightly better now.  Check a A1c. DJD: Has occasionally neck and back pain, request a baclofen refill.  Will do. Elevated homocystine: Good compliance with folic acid, recheck levels on RTC RTC 3 to 4 months

## 2019-09-11 ENCOUNTER — Ambulatory Visit (HOSPITAL_BASED_OUTPATIENT_CLINIC_OR_DEPARTMENT_OTHER)
Admission: RE | Admit: 2019-09-11 | Discharge: 2019-09-11 | Disposition: A | Discharge status: Home / Self Care | Payer: Medicare Other | Source: Ambulatory Visit | Attending: Internal Medicine | Admitting: Internal Medicine

## 2019-09-11 ENCOUNTER — Encounter: Payer: Self-pay | Admitting: Internal Medicine

## 2019-09-11 ENCOUNTER — Ambulatory Visit (INDEPENDENT_AMBULATORY_CARE_PROVIDER_SITE_OTHER): Payer: Medicare Other | Admitting: Internal Medicine

## 2019-09-11 ENCOUNTER — Other Ambulatory Visit: Payer: Self-pay

## 2019-09-11 VITALS — BP 175/54 | HR 72 | Temp 96.6°F | Resp 18 | Ht 61.0 in | Wt 145.0 lb

## 2019-09-11 DIAGNOSIS — M79605 Pain in left leg: Secondary | ICD-10-CM | POA: Diagnosis not present

## 2019-09-11 DIAGNOSIS — R600 Localized edema: Secondary | ICD-10-CM | POA: Diagnosis not present

## 2019-09-11 DIAGNOSIS — M79604 Pain in right leg: Secondary | ICD-10-CM | POA: Insufficient documentation

## 2019-09-11 NOTE — Progress Notes (Signed)
Subjective:    Patient ID: Patricia Moore, female    DOB: Dec 01, 1933, 86 y.o.   MRN: [id number]  DOS:  09/11/2019 Type of visit - description: Acute Was seen here 2 days ago she was doing well, however that night, she developed a burning feeling on the plantar area that went up to the hips.  Worse on the left leg. Her legs felt heavy and weak but unclear if she had truly a motor deficit. Symptoms lasted about 8 hours. She is feeling back to normal  BP Readings from Last 3 Encounters:  09/11/19 (!) 175/54  09/09/19 132/80  09/09/19 132/80     Review of Systems Denies fever chills No rash No back pain per se No fall No bladder or bowel incontinence  Past Medical History:  Diagnosis Date  . Anxiety 12/09/2013  . Arthritis   . Cataract   . Diabetes mellitus   . Diverticulitis    patient states has had three attacks   . Diverticulosis   . DJD (degenerative joint disease) 12/14/2014  . Glaucoma   . H. pylori infection 2001  . Heart murmur   . Hx of adenomatous colonic polyps   . Hyperlipidemia   . Hypertension   . Intraductal papilloma    bx nef x 2 in the 70s-90s  . Saccular aneurysm   . SCC (squamous cell carcinoma) 03/2011   sees dermatology    Past Surgical History:  Procedure Laterality Date  . BREAST BIOPSY  '91, '95   INTRADUCTAL PAPILLOMA  . BREAST LUMPECTOMY    . CATARACT EXTRACTION Bilateral 09/2014  . CLOSED REDUCTION ULNAR SHAFT Right 10/06/2017   Procedure: CLOSED REDUCTION DISLOCATED RIGHT SHOULDER;  Surgeon: Kathryne Hitch, MD;  Location: [facility] OR;  Service: Orthopedics;  Laterality: Right;  . COLONOSCOPY      Allergies as of 09/11/2019   No Known Allergies     Medication List       Accurate as of Sep 11, 2019 11:59 PM. If you have any questions, ask your nurse or doctor.        ALPRAZolam 0.25 MG tablet Commonly known as: XANAX TAKE 1 TABLET(0.25 MG) BY MOUTH DAILY AS NEEDED   augmented betamethasone dipropionate 0.05 %  cream Commonly known as: DIPROLENE-AF Apply topically 2 (two) times daily.   baclofen 10 MG tablet Commonly known as: LIORESAL Take 1 tablet (10 mg total) by mouth at bedtime as needed for muscle spasms.   carvedilol 25 MG tablet Commonly known as: COREG Take 1 tablet (25 mg total) by mouth 2 (two) times daily with a meal.   cholecalciferol 1000 units tablet Commonly known as: VITAMIN D Take 1,000 Units by mouth daily.   co-enzyme Q-10 30 MG capsule Take 30 mg by mouth daily.   dorzolamide-timolol 22.3-6.8 MG/ML ophthalmic solution Commonly known as: COSOPT Place 1 drop into both eyes 2 (two) times daily.   folic acid 1 MG tablet Commonly known as: FOLVITE Take 1 tablet (1 mg total) by mouth daily.   gabapentin 100 MG capsule Commonly known as: Neurontin Take 1 capsule (100 mg total) by mouth at bedtime.   glucose blood test strip Commonly known as: ONE TOUCH ULTRA TEST CHECK BLOOD SUGAR NO MORE THAN 2 TIMES DAILY   glyBURIDE 2.5 MG tablet Commonly known as: DIABETA Take 1 tablet (2.5 mg total) by mouth daily with breakfast.   metFORMIN 1000 MG tablet Commonly known as: GLUCOPHAGE TAKE 1 TABLET BY MOUTH WITH BREAKFAST, 1 TABLET  WITH LUNCH AND 1/2 TABLET WITH DINNER   onetouch ultrasoft lancets CHECK BLOOD SUGAR NO MORE THAN 2 TIMES DAILY   Restasis 0.05 % ophthalmic emulsion Generic drug: cycloSPORINE   triamcinolone cream 0.1 % Commonly known as: KENALOG Apply 1 application topically 2 (two) times daily.   vitamin B-12 250 MCG tablet Commonly known as: CYANOCOBALAMIN Take 250 mcg by mouth daily.          Objective:   Physical Exam BP (!) 175/54 (BP Location: Left Arm, Patient Position: Sitting, Cuff Size: Small)   Pulse 72   Temp (!) 96.6 F (35.9 C) (Temporal)   Resp 18   Ht 5\' 1"  (1.549 m)   Wt 145 lb (65.8 kg)   LMP 04/17/1988   SpO2 99%   BMI 27.40 kg/m   General:   Well developed, NAD, BMI noted. HEENT:  Normocephalic . Face  symmetric, atraumatic MSK: No TTP of the lumbar spine, hip rotation normal, knee flexion extension normal. Lower extremities: no pretibial edema bilaterally.  Normal pedal pulses Skin: No rash anywhere on the back or lower extremities Neurologic:  alert & oriented X3.  Speech normal, gait appropriate for age and unassisted Motor, DTRs lower extremities: Symmetric, slightly decreased at both ankles. Psych--  Cognition and judgment appear intact.  Cooperative with normal attention span and concentration.  Behavior appropriate. No anxious or depressed appearing.      Assessment     Assessment  DM HTN -Losartan d/c d/t hyperkalemia -Amlodipine d/c d/t edema 07-2017 - HCTZ d/c d/t hyperkalemia 08/2017 Hyperlipidemia -- intolerant to lipitor and crestor (severe cramps).  Declined further trial with medications  on 01/16/2018   Anxiety- xanax rx by pcp Vertigo - valium prn GI: --Recurrent diverticulitis (one episode documented by a CT 11-2014) --H. pylori 2001 DJD--back pain, knees, neck pain ( on baclofen for neck) Osteoporosis : Tscore -1.9 on 11-2015 , T score  -1.8 (03/2019); h/o  R ulnar FX 2019 SCC, skin cancer Elevated homocysteine Raynaud's phenomena  NEURO --Carotid US 10-2013: No significant plaque, although ICA's are serpentine, bilaterally. 40-59% bilateral ICA stenosis by velocity criteria likely due to tortuosity. Patent vertebral arteries with antegrade flow.Normal subclavian arteries, bilaterally. -- Transinet dizziness, diplopia sx late 2016 , saw neuro >>> Dx w/ TIA/Stroke on clinical grounds  W/u:  Brain  MRI 03-2015 >> 2 mm saccular aneurysm at the right posterior communicating artery origin, saw  Neurology, Rx to recheck MRI 1 year, ok continue ASA per neuro CTA head, neck 05-14-2015: aneurysm confirmed, otherwise (-) --brain aneurysm see above, saw neuro 2018, consider repeat imagine 2020 --Admitted 07-2015, dizziness, MRI negative, DX labyrinthitis   PLAN:  Leg  pain: As described above, symptoms lasted 8 hours, etiology not clear, exam is benign with no rash, DTR symmetric. No edema. Recommend observation. Patient adamant about doing further evaluation, request ultrasound, explained that DVT is unlikely, again she states needs  to be 100% sure is not a clot.  Will schedule ultrasound HTN, BP slightly elevated today but typically okay   This visit occurred during the SARS-CoV-2 public health emergency.  Safety protocols were in place, including screening questions prior to the visit, additional usage of staff PPE, and extensive cleaning of exam room while observing appropriate contact time as indicated for disinfecting solutions.

## 2019-09-11 NOTE — Progress Notes (Signed)
Pre visit review using our clinic review tool, if applicable. No additional management support is needed unless otherwise documented below in the visit note. 

## 2019-09-11 NOTE — Patient Instructions (Signed)
Please come back if the symptoms return

## 2019-09-14 NOTE — Assessment & Plan Note (Signed)
Leg pain: As described above, symptoms lasted 8 hours, etiology not clear, exam is benign with no rash, DTR symmetric. No edema. Recommend observation. Patient adamant about doing further evaluation, request ultrasound, explained that DVT is unlikely, again she states needs  to be 100% sure is not a clot.  Will schedule ultrasound HTN, BP slightly elevated today but typically okay

## 2019-10-15 ENCOUNTER — Other Ambulatory Visit: Payer: Self-pay

## 2019-10-15 ENCOUNTER — Encounter: Payer: Self-pay | Admitting: Internal Medicine

## 2019-10-15 ENCOUNTER — Ambulatory Visit (INDEPENDENT_AMBULATORY_CARE_PROVIDER_SITE_OTHER): Payer: Medicare Other | Admitting: Internal Medicine

## 2019-10-15 VITALS — BP 166/71 | HR 61 | Temp 96.9°F | Resp 18 | Ht 61.0 in | Wt 141.2 lb

## 2019-10-15 DIAGNOSIS — I10 Essential (primary) hypertension: Secondary | ICD-10-CM | POA: Diagnosis not present

## 2019-10-15 DIAGNOSIS — E119 Type 2 diabetes mellitus without complications: Secondary | ICD-10-CM

## 2019-10-15 DIAGNOSIS — R109 Unspecified abdominal pain: Secondary | ICD-10-CM

## 2019-10-15 DIAGNOSIS — R195 Other fecal abnormalities: Secondary | ICD-10-CM

## 2019-10-15 LAB — COMPREHENSIVE METABOLIC PANEL
ALT: 16 U/L (ref 0–35)
AST: 24 U/L (ref 0–37)
Albumin: 4.4 g/dL (ref 3.5–5.2)
Alkaline Phosphatase: 75 U/L (ref 39–117)
BUN: 21 mg/dL (ref 6–23)
CO2: 30 mEq/L (ref 19–32)
Calcium: 10.2 mg/dL (ref 8.4–10.5)
Chloride: 97 mEq/L (ref 96–112)
Creatinine, Ser: 1.02 mg/dL (ref 0.40–1.20)
GFR: 51.66 mL/min — ABNORMAL LOW (ref >60.00)
Glucose, Bld: 143 mg/dL — ABNORMAL HIGH (ref 70–99)
Potassium: 4.3 mEq/L (ref 3.5–5.1)
Sodium: 133 mEq/L — ABNORMAL LOW (ref 135–145)
Total Bilirubin: 0.5 mg/dL (ref 0.2–1.2)
Total Protein: 8.2 g/dL (ref 6.0–8.3)

## 2019-10-15 LAB — HEMOCCULT GUIAC POC 1CARD (OFFICE): Fecal Occult Blood, POC: NEGATIVE

## 2019-10-15 LAB — CBC WITH DIFFERENTIAL/PLATELET
Basophils Absolute: 0.1 10*3/uL (ref 0.0–0.1)
Basophils Relative: 1.4 % (ref 0.0–3.0)
Eosinophils Absolute: 0.9 10*3/uL — ABNORMAL HIGH (ref 0.0–0.7)
Eosinophils Relative: 9.1 % — ABNORMAL HIGH (ref 0.0–5.0)
HCT: 39.3 % (ref 36.0–46.0)
Hemoglobin: 13.6 g/dL (ref 12.0–15.0)
Lymphocytes Relative: 26.6 % (ref 12.0–46.0)
Lymphs Abs: 2.5 10*3/uL (ref 0.7–4.0)
MCHC: 34.5 g/dL (ref 30.0–36.0)
MCV: 91.3 fl (ref 78.0–100.0)
Monocytes Absolute: 0.7 10*3/uL (ref 0.1–1.0)
Monocytes Relative: 7.7 % (ref 3.0–12.0)
Neutro Abs: 5.3 10*3/uL (ref 1.4–7.7)
Neutrophils Relative %: 55.2 % (ref 43.0–77.0)
Platelets: 278 10*3/uL (ref 150.0–400.0)
RBC: 4.31 Mil/uL (ref 3.87–5.11)
RDW: 12.3 % (ref 11.5–15.5)
WBC: 9.5 10*3/uL (ref 4.0–10.5)

## 2019-10-15 LAB — POC URINALSYSI DIPSTICK (AUTOMATED)
Bilirubin, UA: NEGATIVE
Blood, UA: NEGATIVE
Glucose, UA: NEGATIVE
Ketones, UA: NEGATIVE
Leukocytes, UA: NEGATIVE
Nitrite, UA: NEGATIVE
Protein, UA: POSITIVE — AB
Spec Grav, UA: 1.01 (ref 1.010–1.025)
Urobilinogen, UA: 0.2 E.U./dL
pH, UA: 8.5 — AB (ref 5.0–8.0)

## 2019-10-15 MED ORDER — GLYBURIDE 2.5 MG PO TABS: 2.5000 mg | ORAL_TABLET | Freq: Every day | ORAL | 1 refills | Status: DC

## 2019-10-15 NOTE — Progress Notes (Signed)
Subjective:    Patient ID: Patricia Moore, female    DOB: 22-May-1933, 86 y.o.   MRN: [id number]  DOS:  10/15/2019 Type of visit - description: Acute Yesterday, developed moderate left lower abdominal pain, lasted all day, no radiation, it is better today. Yesterday follow a bland diet w/o increased pain sxs associated with nausea and generalized feeling of bloating. She saw dark stools but no blood in them.  BP Readings from Last 3 Encounters:  10/15/19 (!) 166/71  09/11/19 (!) 175/54  09/09/19 132/80    Review of Systems No fever chills Last bowel movement yesterday, a small amount, slightly loose. No vomiting, no diarrhea per se, again no blood in the stools. No dysuria, gross hematuria or difficulty urinating.   Past Medical History:  Diagnosis Date  . Anxiety 12/09/2013  . Arthritis   . Cataract   . Diabetes mellitus   . Diverticulitis    patient states has had three attacks   . Diverticulosis   . DJD (degenerative joint disease) 12/14/2014  . Glaucoma   . H. pylori infection 2001  . Heart murmur   . Hx of adenomatous colonic polyps   . Hyperlipidemia   . Hypertension   . Intraductal papilloma    bx nef x 2 in the 70s-90s  . Saccular aneurysm   . SCC (squamous cell carcinoma) 03/2011   sees dermatology    Past Surgical History:  Procedure Laterality Date  . BREAST BIOPSY  '91, '95   INTRADUCTAL PAPILLOMA  . BREAST LUMPECTOMY    . CATARACT EXTRACTION Bilateral 09/2014  . CLOSED REDUCTION ULNAR SHAFT Right 10/06/2017   Procedure: CLOSED REDUCTION DISLOCATED RIGHT SHOULDER;  Surgeon: Kathryne Hitch, MD;  Location: [facility] OR;  Service: Orthopedics;  Laterality: Right;  . COLONOSCOPY      Allergies as of 10/15/2019   No Known Allergies     Medication List       Accurate as of October 15, 2019  9:15 PM. If you have any questions, ask your nurse or doctor.        ALPRAZolam 0.25 MG tablet Commonly known as: XANAX TAKE 1 TABLET(0.25 MG) BY MOUTH DAILY  AS NEEDED   augmented betamethasone dipropionate 0.05 % cream Commonly known as: DIPROLENE-AF Apply topically 2 (two) times daily.   baclofen 10 MG tablet Commonly known as: LIORESAL Take 1 tablet (10 mg total) by mouth at bedtime as needed for muscle spasms.   carvedilol 25 MG tablet Commonly known as: COREG Take 1 tablet (25 mg total) by mouth 2 (two) times daily with a meal.   cholecalciferol 1000 units tablet Commonly known as: VITAMIN D Take 1,000 Units by mouth daily.   co-enzyme Q-10 30 MG capsule Take 30 mg by mouth daily.   dorzolamide-timolol 22.3-6.8 MG/ML ophthalmic solution Commonly known as: COSOPT Place 1 drop into both eyes 2 (two) times daily.   folic acid 1 MG tablet Commonly known as: FOLVITE Take 1 tablet (1 mg total) by mouth daily.   gabapentin 100 MG capsule Commonly known as: Neurontin Take 1 capsule (100 mg total) by mouth at bedtime.   glucose blood test strip Commonly known as: ONE TOUCH ULTRA TEST CHECK BLOOD SUGAR NO MORE THAN 2 TIMES DAILY   glyBURIDE 2.5 MG tablet Commonly known as: DIABETA Take 1 tablet (2.5 mg total) by mouth daily with breakfast.   metFORMIN 1000 MG tablet Commonly known as: GLUCOPHAGE TAKE 1 TABLET BY MOUTH WITH BREAKFAST, 1 TABLET WITH LUNCH  AND 1/2 TABLET WITH DINNER   onetouch ultrasoft lancets CHECK BLOOD SUGAR NO MORE THAN 2 TIMES DAILY   Restasis 0.05 % ophthalmic emulsion Generic drug: cycloSPORINE   triamcinolone cream 0.1 % Commonly known as: KENALOG Apply 1 application topically 2 (two) times daily.   vitamin B-12 250 MCG tablet Commonly known as: CYANOCOBALAMIN Take 250 mcg by mouth daily.          Objective:   Physical Exam BP (!) 166/71 (BP Location: Left Arm, Patient Position: Sitting, Cuff Size: Small)   Pulse 61   Temp (!) 96.9 F (36.1 C) (Temporal)   Resp 18   Ht 5\' 1"  (1.549 m)   Wt 141 lb 4 oz (64.1 kg)   LMP 04/17/1988   SpO2 100%   BMI 26.69 kg/m  General:   Well  developed, NAD, BMI noted.  HEENT:  Normocephalic . Face symmetric, atraumatic Lungs:  CTA B Normal respiratory effort, no intercostal retractions, no accessory muscle use. Heart: RRR,  no murmur.  Abdomen:  Not distended, soft, slightly TTP at the left lower quadrant without mass or rebound DRE: No mass, brown stools, Hemoccult negative Skin: Not pale. Hands: Jaundice? Lower extremities: no pretibial edema bilaterally  Neurologic:  alert & oriented X3.  Speech normal, gait appropriate for age and unassisted Psych--  Cognition and judgment appear intact.  Cooperative with normal attention span and concentration.  Behavior appropriate. No anxious or depressed appearing.     Assessment     Assessment  DM HTN -Losartan d/c d/t hyperkalemia -Amlodipine d/c d/t edema 07-2017 - HCTZ d/c d/t hyperkalemia 08/2017 Hyperlipidemia -- intolerant to lipitor and crestor (severe cramps).  Declined further trial with medications  on 01/16/2018   Anxiety- xanax rx by pcp Vertigo - valium prn GI: --Recurrent diverticulitis (one episode documented by a CT 11-2014) --H. pylori 2001 DJD--back pain, knees, neck pain ( on baclofen for neck) Osteoporosis : Tscore -1.9 on 11-2015 , T score  -1.8 (03/2019); h/o  R ulnar FX 2019 SCC, skin cancer Elevated homocysteine Raynaud's phenomena  NEURO --Carotid US 10-2013: No significant plaque, although ICA's are serpentine, bilaterally. 40-59% bilateral ICA stenosis by velocity criteria likely due to tortuosity. Patent vertebral arteries with antegrade flow.Normal subclavian arteries, bilaterally. 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 Although she is tender, I do not think she needs empiric antibiotics, will wait for results. Question of jaundice: Labs HTN: Elevated today, consistently normal at home. No change DM: RF glyburide   This visit occurred during the SARS-CoV-2 public health emergency.  Safety protocols were in place, including screening questions prior to the visit, additional usage of staff PPE, and extensive cleaning of exam room while observing appropriate contact time as indicated for disinfecting solutions.

## 2019-10-15 NOTE — Assessment & Plan Note (Signed)
LLQ abdominal pain: She has a history of recurrent diverticulitis, last CT was 2016, she has been treated empirically.Udip no leukocytes-blood  Plan: Stat CMP CBC, follow-up by CT. Although she is tender, I do not think she needs empiric antibiotics, will wait for results. Question of jaundice: Labs HTN: Elevated today, consistently normal at home. No change DM: RF glyburide

## 2019-10-15 NOTE — Patient Instructions (Addendum)
Check the  blood pressure 4-5 times a week BP GOAL is between 110/65 and  135/85. If it is consistently higher or lower, let me know   GO TO THE LAB : Get the blood work     Drink plenty fluids, call anytime if you have severe abdominal pain, fever or chills.

## 2019-10-15 NOTE — Progress Notes (Signed)
Pre visit review using our clinic review tool, if applicable. No additional management support is needed unless otherwise documented below in the visit note. 

## 2019-10-17 ENCOUNTER — Other Ambulatory Visit: Payer: Self-pay

## 2019-10-17 ENCOUNTER — Ambulatory Visit (HOSPITAL_BASED_OUTPATIENT_CLINIC_OR_DEPARTMENT_OTHER)
Admission: RE | Admit: 2019-10-17 | Discharge: 2019-10-17 | Disposition: A | Discharge status: Home / Self Care | Payer: Medicare Other | Source: Ambulatory Visit | Attending: Internal Medicine | Admitting: Internal Medicine

## 2019-10-17 ENCOUNTER — Telehealth: Payer: Self-pay

## 2019-10-17 DIAGNOSIS — R109 Unspecified abdominal pain: Secondary | ICD-10-CM | POA: Insufficient documentation

## 2019-10-17 DIAGNOSIS — I700 Atherosclerosis of aorta: Secondary | ICD-10-CM | POA: Diagnosis not present

## 2019-10-17 DIAGNOSIS — E785 Hyperlipidemia, unspecified: Secondary | ICD-10-CM

## 2019-10-17 DIAGNOSIS — E119 Type 2 diabetes mellitus without complications: Secondary | ICD-10-CM

## 2019-10-17 DIAGNOSIS — I10 Essential (primary) hypertension: Secondary | ICD-10-CM

## 2019-10-17 DIAGNOSIS — K449 Diaphragmatic hernia without obstruction or gangrene: Secondary | ICD-10-CM | POA: Diagnosis not present

## 2019-10-17 DIAGNOSIS — K5790 Diverticulosis of intestine, part unspecified, without perforation or abscess without bleeding: Secondary | ICD-10-CM | POA: Diagnosis not present

## 2019-10-17 MED ORDER — GLYBURIDE 2.5 MG PO TABS: 2.5000 mg | ORAL_TABLET | Freq: Two times a day (BID) | ORAL | 1 refills | Status: DC

## 2019-10-17 MED ORDER — IOHEXOL 300 MG/ML  SOLN
100.0000 mL | Freq: Once | INTRAMUSCULAR | Status: AC | PRN
  Administered 2019-10-17: 100 mL via INTRAVENOUS

## 2019-10-17 NOTE — Telephone Encounter (Signed)
I reviewed the chart, we have been refilling glyburide 1 tablet daily, last A1c satisfactory. If she has been consistently taking 2 glyburide once a day then is okay to refill, please amend or edit the medication list

## 2019-10-17 NOTE — Telephone Encounter (Signed)
[facility] called regarding Pt's glyburide which is written once daily- Pt is informing them she was told to take twice daily. Please advise?

## 2019-10-17 NOTE — Telephone Encounter (Signed)
Rx sent for glyburide 2.5mg  bid.

## 2019-10-24 ENCOUNTER — Telehealth: Payer: Medicare Other

## 2019-10-24 ENCOUNTER — Ambulatory Visit: Payer: Self-pay | Admitting: Pharmacist

## 2019-10-24 NOTE — Chronic Care Management (AMB) (Signed)
Chronic Care Management   Outreach Note  10/24/2019 Name: Patricia Moore MRN: [id number] DOB: [birth year]  Referred by: Wanda Plump, MD Reason for referral : No chief complaint on file.   A second unsuccessful telephone outreach was attempted today. The patient was referred to pharmacist for assistance with care management and care coordination.   Called at 11:07 am (left VM) and 11:20am with no answer.  Follow Up Plan:  -Will plan to reschedule pt before end of the month  Dannielle Karvonen Xiao Graul, PharmD Clinical Pharmacist [facility] [phone]

## 2019-10-24 NOTE — Chronic Care Management (AMB) (Deleted)
Chronic Care Management Pharmacy  Name: Patricia Moore  MRN: [id number] DOB: [birth year]  Chief Complaint/ HPI  Patricia Moore,  86 y.o. , female presents for their Initial CCM visit with the clinical pharmacist via telephone due to COVID-19 Pandemic.  PCP : Wanda Plump, MD  Their chronic conditions include: {CHL AMB CHRONIC MEDICAL CONDITIONS:[phone]}  Office Visits: 10/15/19: Visit w/ Dr. Drue Novel - LLQ pain. No need for empiric abx. Will wait for results. No med changes noted.   09/11/19: Visit w/ Dr. Drue Novel - Leg pain. Recommend observation, but patient would like ultrasound for DVT. No med changes noted.   09/09/19: Visit w/ Dr. Drue Novel - Palpitations - pt wore 2 week monitor, rare PVC, cardio recommend no tx. No med changes noted.   08/08/19: Visit w/ Dr. Drue Novel - Rash - recommend avoid shaving and deordorants. Change steroid to diprolene  Consult Visit: 07/30/19: Cardio visit w/ Dr. Antoine Poche - Palpitations - 2 week monitor placed  07/07/19: Neuro visit w/ Dr. Everlena Cooper - Cervical spondylosis - recommend PT, but pt would like to avoid due to covid. Retrial of gabapentin 100mg  HS  Medications: Outpatient Encounter Medications as of 10/24/2019  Medication Sig  . ALPRAZolam (XANAX) 0.25 MG tablet TAKE 1 TABLET(0.25 MG) BY MOUTH DAILY AS NEEDED  . augmented betamethasone dipropionate (DIPROLENE-AF) 0.05 % cream Apply topically 2 (two) times daily. (Patient not taking: Reported on 10/15/2019)  . baclofen (LIORESAL) 10 MG tablet Take 1 tablet (10 mg total) by mouth at bedtime as needed for muscle spasms.  . carvedilol (COREG) 25 MG tablet Take 1 tablet (25 mg total) by mouth 2 (two) times daily with a meal.  . cholecalciferol (VITAMIN D) 1000 UNITS tablet Take 1,000 Units by mouth daily.  Marland Kitchen co-enzyme Q-10 30 MG capsule Take 30 mg by mouth daily.  . dorzolamide-timolol (COSOPT) 22.3-6.8 MG/ML ophthalmic solution Place 1 drop into both eyes 2 (two) times daily.   . folic acid (FOLVITE) 1 MG tablet Take 1  tablet (1 mg total) by mouth daily.  Marland Kitchen gabapentin (NEURONTIN) 100 MG capsule Take 1 capsule (100 mg total) by mouth at bedtime.  Marland Kitchen glucose blood (ONE TOUCH ULTRA TEST) test strip CHECK BLOOD SUGAR NO MORE THAN 2 TIMES DAILY  . glyBURIDE (DIABETA) 2.5 MG tablet Take 1 tablet (2.5 mg total) by mouth 2 (two) times daily with a meal.  . Lancets (ONETOUCH ULTRASOFT) lancets CHECK BLOOD SUGAR NO MORE THAN 2 TIMES DAILY  . metFORMIN (GLUCOPHAGE) 1000 MG tablet TAKE 1 TABLET BY MOUTH WITH BREAKFAST, 1 TABLET WITH LUNCH AND 1/2 TABLET WITH DINNER  . RESTASIS 0.05 % ophthalmic emulsion   . triamcinolone cream (KENALOG) 0.1 % Apply 1 application topically 2 (two) times daily. (Patient not taking: Reported on 10/15/2019)  . vitamin B-12 (CYANOCOBALAMIN) 250 MCG tablet Take 250 mcg by mouth daily.   No facility-administered encounter medications on file as of 10/24/2019.     Current Diagnosis/Assessment:  Goals Addressed   None     Diabetes   Recent Relevant Labs: Lab Results  Component Value Date/Time   HGBA1C 6.9 (H) 09/09/2019 02:28 PM   HGBA1C 6.7 (H) 06/12/2019 10:06 AM   MICROALBUR <0.7 12/13/2018 09:59 AM   MICROALBUR <0.7 08/30/2017 08:26 AM     Checking BG: {CHL HP Blood Glucose Monitoring [id number]  Recent FBG Readings: Recent pre-meal BG readings: *** Recent 2hr PP BG readings:  *** Recent HS BG readings: *** Patient has failed these meds in past: ***  Patient is currently {CHL Controlled/Uncontrolled:[phone]} on the following medications: ***  Last diabetic Foot exam:  Lab Results  Component Value Date/Time   HMDIABEYEEXA No Retinopathy 11/15/2018 12:00 AM    Last diabetic Eye exam:  Lab Results  Component Value Date/Time   HMDIABFOOTEX Done/Normal 12/14/2014 12:00 AM     We discussed: {CHL HP Upstream Pharmacy discussion:[phone]}  Plan  Continue {CHL HP Upstream Pharmacy Plans:[phone]}   Hypertension   BP today is:  {CHL HP UPSTREAM  Pharmacist BP [phone]}  Office blood pressures are  BP Readings from Last 3 Encounters:  10/15/19 (!) 166/71  09/11/19 (!) 175/54  09/09/19 132/80    Patient has failed these meds in the past: losartan (hyperkalemia), amlodipine (edema), hctz (hyperkalemia) Patient is currently {CHL Controlled/Uncontrolled:[phone]} on the following medications: ***   Patient checks BP at home {CHL HP BP Monitoring Frequency:[phone]}  Patient home BP readings are ranging: ***  We discussed {CHL HP Upstream Pharmacy discussion:[phone]}  Plan  Continue {CHL HP Upstream Pharmacy Plans:[phone]}   Hyperlipidemia   LDL goal < ***  Lipid Panel     Component Value Date/Time   CHOL 198 06/12/2019 1006   TRIG 108.0 06/12/2019 1006   HDL 63.10 06/12/2019 1006   LDLCALC 113 (H) 06/12/2019 1006   LDLDIRECT 131.9 10/28/2010 1103    Hepatic Function Latest Ref Rng & Units 10/15/2019 06/12/2019 05/28/2018  Total Protein 6.0 - 8.3 g/dL 8.2 7.9 7.8  Albumin 3.5 - 5.2 g/dL 4.4 4.3 4.6  AST 0 - 37 U/L 24 23 23   ALT 0 - 35 U/L 16 17 15   Alk Phosphatase 39 - 117 U/L 75 76 73  Total Bilirubin 0.2 - 1.2 mg/dL 0.5 0.5 0.5  Bilirubin, Direct 0.0 - 0.3 mg/dL - - -     The ASCVD Risk score Denman George DC Jr., et al., 2013) failed to calculate for the following reasons:   The 2013 ASCVD risk score is only valid for ages 56 to 71   Patient has failed these meds in past: atorvastatin, rousvastatin (severe cramps). Noted decline of further trial with meds on 01/16/18. Patient is currently {CHL Controlled/Uncontrolled:[phone]} on the following medications:  . ***  We discussed:  {CHL HP Upstream Pharmacy discussion:[phone]}  Plan  Continue {CHL HP Upstream Pharmacy Plans:[phone]}  Other Diagnosis:***    Patient has failed these meds in past: *** Patient is currently {CHL Controlled/Uncontrolled:[phone]} on the following medications: ***  We discussed:  {CHL HP Upstream Pharmacy  discussion:[phone]}  Plan  Continue {CHL HP Upstream Pharmacy ZOXWR:[id number]}

## 2019-10-28 ENCOUNTER — Telehealth: Payer: Self-pay | Admitting: Internal Medicine

## 2019-10-28 NOTE — Progress Notes (Signed)
Chronic Care Management   Outreach Note  10/28/2019 Name: Patricia Moore MRN: [id number] DOB: [birth year]  Referred by: Wanda Plump, MD Reason for referral : No chief complaint on file.   An unsuccessful telephone outreach was attempted today. The patient was referred to the pharmacist for assistance with care management and care coordination.   Follow Up Plan:   Lynnae January Upstream Scheduler

## 2019-10-31 ENCOUNTER — Telehealth: Payer: Self-pay | Admitting: Internal Medicine

## 2019-10-31 NOTE — Progress Notes (Signed)
Chronic Care Management   Outreach Note  10/31/2019 Name: Patricia Moore MRN: [id number] DOB: 1934/01/27  Referred by: Wanda Plump, MD Reason for referral : No chief complaint on file.   An unsuccessful telephone outreach was attempted today. The patient was referred to the pharmacist for assistance with care management and care coordination.   Follow Up Plan:   Lynnae January Upstream Scheduler

## 2019-11-10 DIAGNOSIS — H401131 Primary open-angle glaucoma, bilateral, mild stage: Secondary | ICD-10-CM | POA: Diagnosis not present

## 2019-11-10 DIAGNOSIS — H527 Unspecified disorder of refraction: Secondary | ICD-10-CM | POA: Diagnosis not present

## 2019-11-17 ENCOUNTER — Telehealth: Payer: Self-pay | Admitting: Internal Medicine

## 2019-11-17 ENCOUNTER — Other Ambulatory Visit: Payer: Self-pay

## 2019-11-17 MED ORDER — AMOXICILLIN-POT CLAVULANATE 875-125 MG PO TABS
1.0000 | ORAL_TABLET | Freq: Two times a day (BID) | ORAL | 0 refills | Status: DC
Start: 2019-11-17 — End: 2019-11-18

## 2019-11-17 NOTE — Telephone Encounter (Signed)
1.  Prescribe Augmentin 875 mg twice daily for 1 week 2.  Low residue diet 3.  Follow-up with advanced practitioner, next available. 4.  If she were to develop severe pain prior to her scheduled appointment, presented to the emergency room for assessment

## 2019-11-17 NOTE — Telephone Encounter (Signed)
Pt states she started having LLQ pain Thursday and it has gotten worse each day. She states she does not have a fever. She is requesting antibiotics be called in for her as we do not have an available appt. States she does not want to take Cipro anymore as it was very rough on her stomach. Please advise.

## 2019-11-17 NOTE — Telephone Encounter (Signed)
Pt aware, script sent to pharmacy, pt already has OV scheduled and knows to keep the appt.

## 2019-11-18 ENCOUNTER — Ambulatory Visit: Payer: Medicare Other | Admitting: Obstetrics & Gynecology

## 2019-11-18 ENCOUNTER — Ambulatory Visit (INDEPENDENT_AMBULATORY_CARE_PROVIDER_SITE_OTHER): Payer: Medicare Other | Admitting: Internal Medicine

## 2019-11-18 ENCOUNTER — Encounter: Payer: Self-pay | Admitting: Internal Medicine

## 2019-11-18 ENCOUNTER — Other Ambulatory Visit: Payer: Self-pay

## 2019-11-18 VITALS — BP 164/73 | HR 77 | Temp 98.3°F | Resp 16 | Ht 61.0 in | Wt 145.4 lb

## 2019-11-18 DIAGNOSIS — R21 Rash and other nonspecific skin eruption: Secondary | ICD-10-CM

## 2019-11-18 DIAGNOSIS — R1032 Left lower quadrant pain: Secondary | ICD-10-CM

## 2019-11-18 DIAGNOSIS — R109 Unspecified abdominal pain: Secondary | ICD-10-CM | POA: Diagnosis not present

## 2019-11-18 LAB — POC URINALSYSI DIPSTICK (AUTOMATED)
Bilirubin, UA: NEGATIVE
Blood, UA: NEGATIVE
Glucose, UA: NEGATIVE
Ketones, UA: NEGATIVE
Leukocytes, UA: NEGATIVE
Nitrite, UA: NEGATIVE
Protein, UA: NEGATIVE
Spec Grav, UA: 1.01 (ref 1.010–1.025)
Urobilinogen, UA: 0.2 E.U./dL
pH, UA: 7 (ref 5.0–8.0)

## 2019-11-18 MED ORDER — BETAMETHASONE DIPROPIONATE AUG 0.05 % EX CREA: TOPICAL_CREAM | Freq: Two times a day (BID) | CUTANEOUS | 0 refills | Status: DC

## 2019-11-18 MED ORDER — HYOSCYAMINE SULFATE 0.125 MG PO TABS: 0.1250 mg | ORAL_TABLET | ORAL | 0 refills | Status: DC | PRN

## 2019-11-18 NOTE — Progress Notes (Signed)
Pre visit review using our clinic review tool, if applicable. No additional management support is needed unless otherwise documented below in the visit note. 

## 2019-11-18 NOTE — Patient Instructions (Addendum)
Stop antibiotics  Good hydration  Follow-up bland diet  Start hyoscyamine  See your gynecologist  Call or go to the ER if: Fever, chills, severe symptoms, not gradually better in the next few days.

## 2019-11-18 NOTE — Progress Notes (Signed)
Subjective:    Patient ID: DIM CARDI, female    DOB: April 17, 1934, 86 y.o.   MRN: [id number]  DOS:  11/18/2019 Type of visit - description: Acute Reports left lower quadrant abdominal pain on and off for the last   5 days, pain is described as crampy. Yesterday, she called GI, was recommended Augmentin which she is doing. Since then, symptoms are subjectively better, she has developed some nausea she believes related to the antibiotics.  Denies fever chills Appetite is slightly decreased but good p.o. tolerance. No vomiting, diarrhea or blood in the stools No dysuria, gross hematuria or difficulty urinating.   Review of Systems See above   Past Medical History:  Diagnosis Date  . Anxiety 12/09/2013  . Arthritis   . Cataract   . Diabetes mellitus   . Diverticulitis    patient states has had three attacks   . Diverticulosis   . DJD (degenerative joint disease) 12/14/2014  . Glaucoma   . H. pylori infection 2001  . Heart murmur   . Hx of adenomatous colonic polyps   . Hyperlipidemia   . Hypertension   . Intraductal papilloma    bx nef x 2 in the 70s-90s  . Saccular aneurysm   . SCC (squamous cell carcinoma) 03/2011   sees dermatology    Past Surgical History:  Procedure Laterality Date  . BREAST BIOPSY  '91, '95   INTRADUCTAL PAPILLOMA  . BREAST LUMPECTOMY    . CATARACT EXTRACTION Bilateral 09/2014  . CLOSED REDUCTION ULNAR SHAFT Right 10/06/2017   Procedure: CLOSED REDUCTION DISLOCATED RIGHT SHOULDER;  Surgeon: Kathryne Hitch, MD;  Location: [facility] OR;  Service: Orthopedics;  Laterality: Right;  . COLONOSCOPY      Allergies as of 11/18/2019   No Known Allergies     Medication List       Accurate as of November 18, 2019 11:59 PM. If you have any questions, ask your nurse or doctor.        STOP taking these medications   amoxicillin-clavulanate 875-125 MG tablet Commonly known as: AUGMENTIN Stopped by: Willow Ora, MD   triamcinolone cream 0.1 % Commonly  known as: KENALOG Stopped by: Willow Ora, MD     TAKE these medications   ALPRAZolam 0.25 MG tablet Commonly known as: XANAX TAKE 1 TABLET(0.25 MG) BY MOUTH DAILY AS NEEDED   augmented betamethasone dipropionate 0.05 % cream Commonly known as: DIPROLENE-AF Apply topically 2 (two) times daily.   baclofen 10 MG tablet Commonly known as: LIORESAL Take 1 tablet (10 mg total) by mouth at bedtime as needed for muscle spasms.   carvedilol 25 MG tablet Commonly known as: COREG Take 1 tablet (25 mg total) by mouth 2 (two) times daily with a meal.   cholecalciferol 1000 units tablet Commonly known as: VITAMIN D Take 1,000 Units by mouth daily.   co-enzyme Q-10 30 MG capsule Take 30 mg by mouth daily.   dorzolamide-timolol 22.3-6.8 MG/ML ophthalmic solution Commonly known as: COSOPT Place 1 drop into both eyes 2 (two) times daily.   folic acid 1 MG tablet Commonly known as: FOLVITE Take 1 tablet (1 mg total) by mouth daily.   gabapentin 100 MG capsule Commonly known as: Neurontin Take 1 capsule (100 mg total) by mouth at bedtime.   glucose blood test strip Commonly known as: ONE TOUCH ULTRA TEST CHECK BLOOD SUGAR NO MORE THAN 2 TIMES DAILY   glyBURIDE 2.5 MG tablet Commonly known as: DIABETA Take 1 tablet (2.5  mg total) by mouth 2 (two) times daily with a meal.   hyoscyamine 0.125 MG tablet Commonly known as: LEVSIN Take 1 tablet (0.125 mg total) by mouth every 4 (four) hours as needed. Started by: Willow Ora, MD   metFORMIN 1000 MG tablet Commonly known as: GLUCOPHAGE TAKE 1 TABLET BY MOUTH WITH BREAKFAST, 1 TABLET WITH LUNCH AND 1/2 TABLET WITH DINNER   onetouch ultrasoft lancets CHECK BLOOD SUGAR NO MORE THAN 2 TIMES DAILY   Restasis 0.05 % ophthalmic emulsion Generic drug: cycloSPORINE   vitamin B-12 250 MCG tablet Commonly known as: CYANOCOBALAMIN Take 250 mcg by mouth daily.          Objective:   Physical Exam Abdominal:      BP (!) 164/73 (BP  Location: Left Arm, Patient Position: Sitting, Cuff Size: Small)   Pulse 77   Temp 98.3 F (36.8 C) (Oral)   Resp 16   Ht 5\' 1"  (1.549 m)   Wt 145 lb 6 oz (65.9 kg)   LMP 04/17/1988   SpO2 99%   BMI 27.47 kg/m  General:   Well developed, NAD, BMI noted.  HEENT:  Normocephalic . Face symmetric, atraumatic Lungs:  CTA B Normal respiratory effort, no intercostal retractions, no accessory muscle use. Heart: RRR,  no murmur.  Abdomen:  Not distended, soft, mildly TTP without mass or rebound the left lower quadrant.  See graphic Skin: Not pale. Not jaundice Lower extremities: no pretibial edema bilaterally  Neurologic:  alert & oriented X3.  Speech normal, gait appropriate for age and unassisted Psych--  Cognition and judgment appear intact.  Cooperative with normal attention span and concentration.  Behavior appropriate. No anxious or depressed appearing.     Assessment    Assessment  DM HTN -Losartan d/c d/t hyperkalemia -Amlodipine d/c d/t edema 07-2017 - HCTZ d/c d/t hyperkalemia 08/2017 Hyperlipidemia -- intolerant to lipitor and crestor (severe cramps).  Declined further trial with medications  on 01/16/2018   Anxiety- xanax rx by pcp Vertigo - valium prn GI: --Recurrent diverticulitis (one episode documented by a CT 11-2014) --H. pylori 2001 DJD--back pain, knees, neck pain ( on baclofen for neck) Osteoporosis : Tscore -1.9 on 11-2015 , T score  -1.8 (03/2019); h/o  R ulnar FX 2019 SCC, skin cancer Elevated homocysteine Raynaud's phenomena  NEURO --Carotid US 10-2013: No significant plaque, although ICA's are serpentine, bilaterally. 40-59% bilateral ICA stenosis by velocity criteria likely due to tortuosity. Patent vertebral arteries with antegrade flow.Normal subclavian arteries, bilaterally. -- Transinet dizziness, diplopia sx late 2016 , saw neuro >>> Dx w/ TIA/Stroke on clinical grounds  W/u:  Brain  MRI 03-2015 >> 2 mm saccular aneurysm at the right posterior  communicating artery origin, saw  Neurology, Rx to recheck MRI 1 year, ok continue ASA per neuro CTA head, neck 05-14-2015: aneurysm confirmed, otherwise (-) --brain aneurysm see above, saw neuro 2018, consider repeat imagine 2020 --Admitted 07-2015, dizziness, MRI negative, DX labyrinthitis   PLAN:  LLQ abdominal pain: Was seen at this office 10/17/2019, complaint of LLQ abdominal pain, CBC was normal, CT abdomen showed no acute process. Yesterday, she called her GI complaining of LLQ abdominal pain as described above, was Rx Augmentin, today she feels a slightly better but reports nausea, she thinks due to Augmentin. U dip today >> Normal At this point, without a h/o fever, good p.o. tolerance, no constipation, and with recent CT of the abdomen negative with similar symptoms (sxs self resolved w/o abx)  I have to doubt  the Dx of acute diverticulitis. Plan: Hold Augmentin, bland diet, will see gynecology tomorrow, trial with  spasmotis = hyoscyamine b/c sxs are crampy. Risk of frequent  ABX use d/w pt ( C. Difficile) "Recurrent" diverticulitis: See above  Rash: Also use it tape at the left shoulder for pain and develop a rash, request a refill on topical steroids.  Send.  Time spent with the patient 32 minutes, chart is reviewed, I explained her in detail why I do not think antibiotics are needed at this point and the risks of frequent antibiotics.  This visit occurred during the SARS-CoV-2 public health emergency.  Safety protocols were in place, including screening questions prior to the visit, additional usage of staff PPE, and extensive cleaning of exam room while observing appropriate contact time as indicated for disinfecting solutions.

## 2019-11-19 ENCOUNTER — Ambulatory Visit (INDEPENDENT_AMBULATORY_CARE_PROVIDER_SITE_OTHER): Payer: Medicare Other | Admitting: Obstetrics & Gynecology

## 2019-11-19 ENCOUNTER — Encounter: Payer: Self-pay | Admitting: Obstetrics & Gynecology

## 2019-11-19 VITALS — BP 134/88

## 2019-11-19 DIAGNOSIS — B372 Candidiasis of skin and nail: Secondary | ICD-10-CM | POA: Diagnosis not present

## 2019-11-19 DIAGNOSIS — N6324 Unspecified lump in the left breast, lower inner quadrant: Secondary | ICD-10-CM | POA: Diagnosis not present

## 2019-11-19 LAB — URINALYSIS, ROUTINE W REFLEX MICROSCOPIC
Bilirubin Urine: NEGATIVE
Hgb urine dipstick: NEGATIVE
Ketones, ur: NEGATIVE
Leukocytes,Ua: NEGATIVE
Nitrite: NEGATIVE
RBC / HPF: NONE SEEN (ref 0–?)
Specific Gravity, Urine: 1.01 (ref 1.000–1.030)
Total Protein, Urine: NEGATIVE
Urine Glucose: NEGATIVE
Urobilinogen, UA: 0.2 (ref 0.0–1.0)
WBC, UA: NONE SEEN (ref 0–?)
pH: 7 (ref 5.0–8.0)

## 2019-11-19 MED ORDER — NYSTATIN 100000 UNIT/GM EX POWD
1.0000 | Freq: Two times a day (BID) | CUTANEOUS | 3 refills | Status: DC
Start: 2019-11-19 — End: 2020-06-29

## 2019-11-19 NOTE — Progress Notes (Signed)
Patricia Moore 03-30-1934 [id number]        86 y.o.  G2P0011   RP: Left breast lump x 1 week  HPI: Very small slightly tender lump at 8 O'Clock at the periphery of the left breast.  No change in skin.  No nipple discharge.  C/O bilateral rashes and itching under the breasts.  Had an episode of LLQ pain which lasted 2 days last week.  Resolved spontaneously.  No UTI Sx.  BMs currently normal.  No fever.   OB History  Gravida Para Term Preterm AB Living  2 1     1 1   SAB TAB Ectopic Multiple Live Births  1            # Outcome Date GA Lbr Len/2nd Weight Sex Delivery Anes PTL Lv  2 SAB           1 Para             Past medical history,surgical history, problem list, medications, allergies, family history and social history were all reviewed and documented in the EPIC chart.   Directed ROS with pertinent positives and negatives documented in the history of present illness/assessment and plan.  Exam:  Vitals:   11/19/19 1218  BP: 134/88   General appearance:  Normal  Breast exam:  Rt normal.  Lt very small <1 cm mobile nodule at 8 O'clock at the periphery.  Slightly tender.  No axillary node felt bilaterally.  Mild bilateral rash under the breasts.  Gyn exam:  Vulva normal.  Bimanual exam:  Uterus small, AV, mobile, NT.  No adnexal mass felt, NT.  No blood.  No vaginal d/c.   Assessment/Plan:  86 y.o. G2P0011   1. Breast lump on left side at 8 o'clock position Very small mobile Lt breast nodule at 8 O'Clock at the periphery.  No axillary LN felt.  Will proceed with a Lt Dx Mammo/US.  Patient agrees with the plan.  2. Yeast infection of the skin Yeast of the skin under the breasts.  Will treat with Nystatin powder.  Usage reviewed and prescription sent to pharmacy.  Other orders - nystatin (MYCOSTATIN/NYSTOP) powder; Apply 1 application topically 2 (two) times daily.  [name] MD, 12:56 PM 11/19/2019

## 2019-11-20 LAB — URINE CULTURE
MICRO NUMBER:: 10781490
Result:: NO GROWTH
SPECIMEN QUALITY:: ADEQUATE

## 2019-11-20 NOTE — Assessment & Plan Note (Signed)
LLQ abdominal pain: Was seen at this office 10/17/2019, complaint of LLQ abdominal pain, CBC was normal, CT abdomen showed no acute process. Yesterday, she called her GI complaining of LLQ abdominal pain as described above, was Rx Augmentin, today she feels a slightly better but reports nausea, she thinks due to Augmentin. U dip today >> Normal At this point, without a h/o fever, good p.o. tolerance, no constipation, and with recent CT of the abdomen negative with similar symptoms (sxs self resolved w/o abx)  I have to doubt the Dx of acute diverticulitis. Plan: Hold Augmentin, bland diet, will see gynecology tomorrow, trial with  spasmotis = hyoscyamine b/c sxs are crampy. Risk of frequent  ABX use d/w pt ( C. Difficile) "Recurrent" diverticulitis: See above  Rash: Also use it tape at the left shoulder for pain and develop a rash, request a refill on topical steroids.  Send.

## 2019-11-21 ENCOUNTER — Telehealth: Payer: Self-pay | Admitting: *Deleted

## 2019-11-21 NOTE — Telephone Encounter (Signed)
Patient scheduled at [facility] 01/06/20 @ 1:30pm for breast imaging, order faxed. This is the earliest appointment they have for patient, however she can call then daily for any cancellations. I will route to [place] to relay in [place].

## 2019-11-21 NOTE — Telephone Encounter (Signed)
-----   Message from Princess Bruins, MD sent at 11/19/2019 12:54 PM EDT ----- Regarding: Schedule Left Dx mammo/US at [facility] Left <1 cm nodule at the left Breast at 8 O'Clock external.  Mildly tender.

## 2019-11-21 NOTE — Telephone Encounter (Signed)
Patient informed. 

## 2019-11-25 ENCOUNTER — Other Ambulatory Visit: Payer: Self-pay | Admitting: Internal Medicine

## 2019-12-15 DIAGNOSIS — D2272 Melanocytic nevi of left lower limb, including hip: Secondary | ICD-10-CM | POA: Diagnosis not present

## 2019-12-15 DIAGNOSIS — L309 Dermatitis, unspecified: Secondary | ICD-10-CM | POA: Diagnosis not present

## 2019-12-15 DIAGNOSIS — L603 Nail dystrophy: Secondary | ICD-10-CM | POA: Diagnosis not present

## 2019-12-15 DIAGNOSIS — L820 Inflamed seborrheic keratosis: Secondary | ICD-10-CM | POA: Diagnosis not present

## 2019-12-15 DIAGNOSIS — Z85828 Personal history of other malignant neoplasm of skin: Secondary | ICD-10-CM | POA: Diagnosis not present

## 2019-12-26 ENCOUNTER — Ambulatory Visit: Payer: Medicare Other | Admitting: Physician Assistant

## 2019-12-29 DIAGNOSIS — L309 Dermatitis, unspecified: Secondary | ICD-10-CM | POA: Diagnosis not present

## 2020-01-06 ENCOUNTER — Encounter: Payer: Self-pay | Admitting: Obstetrics & Gynecology

## 2020-01-06 DIAGNOSIS — R922 Inconclusive mammogram: Secondary | ICD-10-CM | POA: Diagnosis not present

## 2020-01-06 DIAGNOSIS — N6489 Other specified disorders of breast: Secondary | ICD-10-CM | POA: Diagnosis not present

## 2020-01-06 LAB — HM MAMMOGRAPHY

## 2020-01-07 ENCOUNTER — Ambulatory Visit: Payer: Medicare Other | Admitting: Internal Medicine

## 2020-01-07 ENCOUNTER — Encounter: Payer: Self-pay | Admitting: Internal Medicine

## 2020-01-08 ENCOUNTER — Telehealth: Payer: Self-pay | Admitting: Internal Medicine

## 2020-01-09 ENCOUNTER — Ambulatory Visit: Payer: Medicare Other | Admitting: Internal Medicine

## 2020-01-09 NOTE — Telephone Encounter (Signed)
Alprazolam refill.   Last OV: 11/18/2019 Last Fill: 04/07/2019 #30 and 2RF Pt sig: 1 tab daily prn UDS: 12/13/2018 Low risk

## 2020-01-09 NOTE — Telephone Encounter (Signed)
PDMP okay, Rx sent 

## 2020-01-12 DIAGNOSIS — L820 Inflamed seborrheic keratosis: Secondary | ICD-10-CM | POA: Diagnosis not present

## 2020-01-12 DIAGNOSIS — L309 Dermatitis, unspecified: Secondary | ICD-10-CM | POA: Diagnosis not present

## 2020-01-12 DIAGNOSIS — L821 Other seborrheic keratosis: Secondary | ICD-10-CM | POA: Diagnosis not present

## 2020-01-21 ENCOUNTER — Encounter: Payer: Self-pay | Admitting: Internal Medicine

## 2020-01-21 ENCOUNTER — Ambulatory Visit (INDEPENDENT_AMBULATORY_CARE_PROVIDER_SITE_OTHER): Payer: Medicare Other | Admitting: Internal Medicine

## 2020-01-21 ENCOUNTER — Other Ambulatory Visit: Payer: Self-pay

## 2020-01-21 VITALS — BP 153/73 | HR 63 | Temp 98.1°F | Resp 16 | Ht 61.0 in | Wt 145.0 lb

## 2020-01-21 DIAGNOSIS — F419 Anxiety disorder, unspecified: Secondary | ICD-10-CM | POA: Diagnosis not present

## 2020-01-21 DIAGNOSIS — R7989 Other specified abnormal findings of blood chemistry: Secondary | ICD-10-CM | POA: Diagnosis not present

## 2020-01-21 DIAGNOSIS — Z79899 Other long term (current) drug therapy: Secondary | ICD-10-CM

## 2020-01-21 DIAGNOSIS — E119 Type 2 diabetes mellitus without complications: Secondary | ICD-10-CM | POA: Diagnosis not present

## 2020-01-21 MED ORDER — SITAGLIPTIN PHOSPHATE 100 MG PO TABS
100.0000 mg | ORAL_TABLET | Freq: Every day | ORAL | 3 refills | Status: DC
Start: 2020-01-21 — End: 2020-06-02

## 2020-01-21 NOTE — Progress Notes (Signed)
Subjective:    Patient ID: Patricia Moore, female    DOB: 1934-02-17, 86 y.o.   MRN: [id number]  DOS:  01/21/2020 Type of visit - description: Follow-up  Here for management of diabetes, CBGs ranged from the 50s to 200s.  Ambulatory BPs okay.  Concerned about her levels of folic acid and homocysteine.  BP Readings from Last 3 Encounters:  01/21/20 (!) 153/73  11/19/19 134/88  11/18/19 (!) 164/73    Review of Systems See above   Past Medical History:  Diagnosis Date  . Anxiety 12/09/2013  . Arthritis   . Cataract   . Diabetes mellitus   . Diverticulitis    patient states has had three attacks   . Diverticulosis   . DJD (degenerative joint disease) 12/14/2014  . Glaucoma   . H. pylori infection 2001  . Heart murmur   . Hx of adenomatous colonic polyps   . Hyperlipidemia   . Hypertension   . Intraductal papilloma    bx nef x 2 in the 70s-90s  . Saccular aneurysm   . SCC (squamous cell carcinoma) 03/2011   sees dermatology    Past Surgical History:  Procedure Laterality Date  . BREAST BIOPSY  '91, '95   INTRADUCTAL PAPILLOMA  . BREAST LUMPECTOMY    . CATARACT EXTRACTION Bilateral 09/2014  . CLOSED REDUCTION ULNAR SHAFT Right 10/06/2017   Procedure: CLOSED REDUCTION DISLOCATED RIGHT SHOULDER;  Surgeon: Kathryne Hitch, MD;  Location: [facility] OR;  Service: Orthopedics;  Laterality: Right;  . COLONOSCOPY      Allergies as of 01/21/2020      Reactions   Ciprofloxacin Nausea And Vomiting      Medication List       Accurate as of January 21, 2020  9:33 PM. If you have any questions, ask your nurse or doctor.        ALPRAZolam 0.25 MG tablet Commonly known as: XANAX TAKE 1 TABLET(0.25 MG) BY MOUTH DAILY AS NEEDED   augmented betamethasone dipropionate 0.05 % cream Commonly known as: DIPROLENE-AF Apply topically 2 (two) times daily.   baclofen 10 MG tablet Commonly known as: LIORESAL Take 1 tablet (10 mg total) by mouth at bedtime as needed for muscle  spasms.   carvedilol 25 MG tablet Commonly known as: COREG TAKE 1 TABLET(25 MG) BY MOUTH TWICE DAILY WITH A MEAL   cholecalciferol 1000 units tablet Commonly known as: VITAMIN D Take 1,000 Units by mouth daily.   co-enzyme Q-10 30 MG capsule Take 30 mg by mouth daily.   dorzolamide-timolol 22.3-6.8 MG/ML ophthalmic solution Commonly known as: COSOPT Place 1 drop into both eyes 2 (two) times daily.   folic acid 1 MG tablet Commonly known as: FOLVITE Take 1 tablet (1 mg total) by mouth daily.   gabapentin 100 MG capsule Commonly known as: Neurontin Take 1 capsule (100 mg total) by mouth at bedtime.   glucose blood test strip Commonly known as: ONE TOUCH ULTRA TEST CHECK BLOOD SUGAR NO MORE THAN 2 TIMES DAILY   glyBURIDE 2.5 MG tablet Commonly known as: DIABETA Take 1 tablet (2.5 mg total) by mouth daily with breakfast. What changed: when to take this Changed by: Willow Ora, MD   hyoscyamine 0.125 MG tablet Commonly known as: LEVSIN Take 1 tablet (0.125 mg total) by mouth every 4 (four) hours as needed.   metFORMIN 1000 MG tablet Commonly known as: GLUCOPHAGE TAKE 1 TABLET BY MOUTH WITH BREAKFAST, 1 TABLET WITH LUNCH AND 1/2 TABLET WITH DINNER  nystatin powder Commonly known as: MYCOSTATIN/NYSTOP Apply 1 application topically 2 (two) times daily.   onetouch ultrasoft lancets CHECK BLOOD SUGAR NO MORE THAN 2 TIMES DAILY   Restasis 0.05 % ophthalmic emulsion Generic drug: cycloSPORINE   sitaGLIPtin 100 MG tablet Commonly known as: Januvia Take 1 tablet (100 mg total) by mouth daily. Started by: Willow Ora, MD   vitamin B-12 250 MCG tablet Commonly known as: CYANOCOBALAMIN Take 250 mcg by mouth daily.          Objective:   Physical Exam BP (!) 153/73 (BP Location: Left Arm, Patient Position: Sitting, Cuff Size: Small)   Pulse 63   Temp 98.1 F (36.7 C) (Oral)   Resp 16   Ht 5\' 1"  (1.549 m)   Wt 145 lb (65.8 kg)   LMP 04/17/1988   SpO2 99%   BMI  27.40 kg/m  General:   Well developed, NAD, BMI noted. HEENT:  Normocephalic . Face symmetric, atraumatic Lungs:  CTA B Normal respiratory effort, no intercostal retractions, no accessory muscle use. Heart: RRR,  no murmur.  Lower extremities: no pretibial edema bilaterally  Skin: Jaundice hands?  Her hands to look slightly yellowish but conjunctiva's are normal. Neurologic:  alert & oriented X3.  Speech normal, gait appropriate for age and unassisted Psych--  Cognition and judgment appear intact.  Cooperative with normal attention span and concentration.  Behavior appropriate. No anxious or depressed appearing.      Assessment     Assessment  DM HTN -Losartan d/c d/t hyperkalemia -Amlodipine d/c d/t edema 07-2017 - HCTZ d/c d/t hyperkalemia 08/2017 Hyperlipidemia -- intolerant to lipitor and crestor (severe cramps).  Declined further trial with medications  on 01/16/2018   Anxiety- xanax rx by pcp Vertigo - valium prn GI: --Recurrent diverticulitis (one episode documented by a CT 11-2014) --H. pylori 2001 DJD--back pain, knees, neck pain ( on baclofen for neck) Osteoporosis : Tscore -1.9 on 11-2015 , T score  -1.8 (03/2019); h/o  R ulnar FX 2019 SCC, skin cancer Elevated homocysteine Raynaud's phenomena  NEURO --Carotid US 10-2013: No significant plaque, although ICA's are serpentine, bilaterally. 40-59% bilateral ICA stenosis by velocity criteria likely due to tortuosity. Patent vertebral arteries with antegrade flow.Normal subclavian arteries, bilaterally. -- Transinet dizziness, diplopia sx late 2016 , saw neuro >>> Dx w/ TIA/Stroke on clinical grounds  W/u:  Brain  MRI 03-2015 >> 2 mm saccular aneurysm at the right posterior communicating artery origin, saw  Neurology, Rx to recheck MRI 1 year, ok continue ASA per neuro CTA head, neck 05-14-2015: aneurysm confirmed, otherwise (-) --brain aneurysm see above, saw neuro 2018.  MRA head 08/2019 stable, recheck 5 years per  neuro --Admitted 07-2015, dizziness, MRI negative, DX labyrinthitis   PLAN: DM: Currently on Metformin and glyburide, CBGs ranged from 50-200.  Would like further dietary advice and avoid great variation of CBGs. Plan: Check a A1c, micro, decrease glyburide to 1 time a day, continue Metformin, add Januvia, referred to a nutritionist. HTN: Currently on carvedilol, BP slightly elevated today, recommend ambulatory BPs. Anxiety: Xanax as needed, check UDS Aspirin: pt sttopped aspirin last year d/t excessive bleeding, no takes it "sometimes". H/o  question of TIA in 2016, she also has a brain aneurysm, d/w pt  pro cons of aspirin, we agreed to stop it. Elevated homocystine: Check a folic acid and homocystine levels Had a flu shot, recommend a Covid booster. RTC 3 months   This visit occurred during the SARS-CoV-2 public health emergency.  Safety  protocols were in place, including screening questions prior to the visit, additional usage of staff PPE, and extensive cleaning of exam room while observing appropriate contact time as indicated for disinfecting solutions.

## 2020-01-21 NOTE — Patient Instructions (Addendum)
Check the  blood pressure regularly BP GOAL is between 110/65 and  135/85. If it is consistently higher or lower, let me know  Diabetes: Take only 1 glyburide a day Continue Metformin Start Januvia 1 tablet daily We are referring you to a nutritionist  Proceed with a Covid booster at your convenience   GO TO THE LAB : Get the blood work     [place], [place] back for a checkup in 3 months

## 2020-01-21 NOTE — Progress Notes (Signed)
Pre visit review using our clinic review tool, if applicable. No additional management support is needed unless otherwise documented below in the visit note. 

## 2020-01-21 NOTE — Assessment & Plan Note (Signed)
DM: Currently on Metformin and glyburide, CBGs ranged from 50-200.  Would like further dietary advice and avoid great variation of CBGs. Plan: Check a A1c, micro, decrease glyburide to 1 time a day, continue Metformin, add Januvia, referred to a nutritionist. HTN: Currently on carvedilol, BP slightly elevated today, recommend ambulatory BPs. Anxiety: Xanax as needed, check UDS Aspirin: pt sttopped aspirin last year d/t excessive bleeding, no takes it "sometimes". H/o  question of TIA in 2016, she also has a brain aneurysm, d/w pt  pro cons of aspirin, we agreed to stop it. Elevated homocystine: Check a folic acid and homocystine levels Had a flu shot, recommend a Covid booster. RTC 3 months

## 2020-01-22 LAB — DRUG MONITORING, PANEL 8 WITH CONFIRMATION, URINE
6 Acetylmorphine: NEGATIVE ng/mL (ref <10)
Alcohol Metabolites: NEGATIVE ng/mL
Amphetamines: NEGATIVE ng/mL (ref <500)
Benzodiazepines: NEGATIVE ng/mL (ref <100)
Buprenorphine, Urine: NEGATIVE ng/mL (ref <5)
Cocaine Metabolite: NEGATIVE ng/mL (ref <150)
Creatinine: 58.5 mg/dL
MDMA: NEGATIVE ng/mL (ref <500)
Marijuana Metabolite: NEGATIVE ng/mL (ref <20)
Opiates: NEGATIVE ng/mL (ref <100)
Oxidant: NEGATIVE ug/mL
Oxycodone: NEGATIVE ng/mL (ref <100)
pH: 5.9 (ref 4.5–9.0)

## 2020-01-22 LAB — DM TEMPLATE

## 2020-01-23 LAB — HEMOGLOBIN A1C
Hgb A1c MFr Bld: 7.2 % of total Hgb — ABNORMAL HIGH (ref <5.7)
Mean Plasma Glucose: 160 (calc)
eAG (mmol/L): 8.9 (calc)

## 2020-01-23 LAB — HOMOCYSTEINE: Homocysteine: 12.4 umol/L — ABNORMAL HIGH (ref <10.4)

## 2020-01-23 LAB — MICROALBUMIN / CREATININE URINE RATIO
Creatinine, Urine: 51 mg/dL (ref 20–275)
Microalb Creat Ratio: 20 mcg/mg creat (ref <30)
Microalb, Ur: 1 mg/dL

## 2020-01-23 LAB — FOLATE: Folate: >24 ng/mL

## 2020-01-26 ENCOUNTER — Encounter: Payer: Self-pay | Admitting: Anesthesiology

## 2020-01-26 DIAGNOSIS — L309 Dermatitis, unspecified: Secondary | ICD-10-CM | POA: Diagnosis not present

## 2020-02-09 DIAGNOSIS — H0011 Chalazion right upper eyelid: Secondary | ICD-10-CM | POA: Diagnosis not present

## 2020-02-09 DIAGNOSIS — H0100A Unspecified blepharitis right eye, upper and lower eyelids: Secondary | ICD-10-CM | POA: Diagnosis not present

## 2020-02-09 DIAGNOSIS — H401131 Primary open-angle glaucoma, bilateral, mild stage: Secondary | ICD-10-CM | POA: Diagnosis not present

## 2020-02-10 ENCOUNTER — Ambulatory Visit (INDEPENDENT_AMBULATORY_CARE_PROVIDER_SITE_OTHER): Payer: Medicare Other | Admitting: Internal Medicine

## 2020-02-10 ENCOUNTER — Other Ambulatory Visit: Payer: Self-pay

## 2020-02-10 ENCOUNTER — Encounter: Payer: Self-pay | Admitting: Internal Medicine

## 2020-02-10 VITALS — BP 155/75 | HR 69 | Temp 97.5°F | Resp 16 | Ht 61.0 in | Wt 145.5 lb

## 2020-02-10 DIAGNOSIS — R7989 Other specified abnormal findings of blood chemistry: Secondary | ICD-10-CM | POA: Diagnosis not present

## 2020-02-10 DIAGNOSIS — I10 Essential (primary) hypertension: Secondary | ICD-10-CM | POA: Diagnosis not present

## 2020-02-10 DIAGNOSIS — R5383 Other fatigue: Secondary | ICD-10-CM | POA: Diagnosis not present

## 2020-02-10 NOTE — Progress Notes (Signed)
Pre visit review using our clinic review tool, if applicable. No additional management support is needed unless otherwise documented below in the visit note. 

## 2020-02-10 NOTE — Patient Instructions (Signed)
Check the  blood pressure regularly BP GOAL is between 110/65 and  135/85. If it is consistently higher or lower, let me know   GO TO THE LAB : Get the blood work

## 2020-02-10 NOTE — Progress Notes (Signed)
Subjective:    Patient ID: Patricia Moore, female    DOB: [birth year], 86 y.o.   MRN: [id number]  DOS:  02/10/2020 Type of visit - description: Acute  Her main concern is fatigue. Reports that for the last 2 months she has been tired. Denies chest pain no difficulty breathing. No DOE, she actually starts to do things at home or walk and she simply feels tired.  Also worries about her vitamin levels  Wt Readings from Last 3 Encounters:  02/10/20 145 lb 8 oz (66 kg)  01/21/20 145 lb (65.8 kg)  11/18/19 145 lb 6 oz (65.9 kg)      Review of Systems Denies fever chills No headaches No depression Sleeps well No dizziness No syncope, still has rarely palpitations. Occasionally has lower extremity edema  Past Medical History:  Diagnosis Date  . Anxiety 12/09/2013  . Arthritis   . Cataract   . Diabetes mellitus   . Diverticulitis    patient states has had three attacks   . Diverticulosis   . DJD (degenerative joint disease) 12/14/2014  . Glaucoma   . H. pylori infection 2001  . Heart murmur   . Hx of adenomatous colonic polyps   . Hyperlipidemia   . Hypertension   . Intraductal papilloma    bx nef x 2 in the 70s-90s  . Saccular aneurysm   . SCC (squamous cell carcinoma) 03/2011   sees dermatology    Past Surgical History:  Procedure Laterality Date  . BREAST BIOPSY  '91, '95   INTRADUCTAL PAPILLOMA  . BREAST LUMPECTOMY    . CATARACT EXTRACTION Bilateral 09/2014  . CLOSED REDUCTION ULNAR SHAFT Right 10/06/2017   Procedure: CLOSED REDUCTION DISLOCATED RIGHT SHOULDER;  Surgeon: Kathryne Hitch, MD;  Location: [facility] OR;  Service: Orthopedics;  Laterality: Right;  . COLONOSCOPY      Allergies as of 02/10/2020      Reactions   Ciprofloxacin Nausea And Vomiting      Medication List       Accurate as of February 10, 2020 11:59 PM. If you have any questions, ask your nurse or doctor.        ALPRAZolam 0.25 MG tablet Commonly known as: XANAX TAKE 1  TABLET(0.25 MG) BY MOUTH DAILY AS NEEDED   augmented betamethasone dipropionate 0.05 % cream Commonly known as: DIPROLENE-AF Apply topically 2 (two) times daily.   baclofen 10 MG tablet Commonly known as: LIORESAL Take 1 tablet (10 mg total) by mouth at bedtime as needed for muscle spasms.   carvedilol 25 MG tablet Commonly known as: COREG TAKE 1 TABLET(25 MG) BY MOUTH TWICE DAILY WITH A MEAL   cholecalciferol 1000 units tablet Commonly known as: VITAMIN D Take 1,000 Units by mouth daily.   co-enzyme Q-10 30 MG capsule Take 30 mg by mouth daily.   dorzolamide-timolol 22.3-6.8 MG/ML ophthalmic solution Commonly known as: COSOPT Place 1 drop into both eyes 2 (two) times daily.   folic acid 1 MG tablet Commonly known as: FOLVITE Take 1 tablet (1 mg total) by mouth daily.   gabapentin 100 MG capsule Commonly known as: Neurontin Take 1 capsule (100 mg total) by mouth at bedtime.   glucose blood test strip Commonly known as: ONE TOUCH ULTRA TEST CHECK BLOOD SUGAR NO MORE THAN 2 TIMES DAILY   glyBURIDE 2.5 MG tablet Commonly known as: DIABETA Take 1 tablet (2.5 mg total) by mouth daily with breakfast.   hyoscyamine 0.125 MG tablet Commonly known as: LEVSIN  Take 1 tablet (0.125 mg total) by mouth every 4 (four) hours as needed.   metFORMIN 1000 MG tablet Commonly known as: GLUCOPHAGE TAKE 1 TABLET BY MOUTH WITH BREAKFAST, 1 TABLET WITH LUNCH AND 1/2 TABLET WITH DINNER   nystatin powder Commonly known as: MYCOSTATIN/NYSTOP Apply 1 application topically 2 (two) times daily.   onetouch ultrasoft lancets CHECK BLOOD SUGAR NO MORE THAN 2 TIMES DAILY   Restasis 0.05 % ophthalmic emulsion Generic drug: cycloSPORINE   sitaGLIPtin 100 MG tablet Commonly known as: Januvia Take 1 tablet (100 mg total) by mouth daily.   vitamin B-12 250 MCG tablet Commonly known as: CYANOCOBALAMIN Take 250 mcg by mouth daily.          Objective:   Physical Exam BP (!) 155/75 (BP  Location: Right Arm, Patient Position: Sitting, Cuff Size: Small)   Pulse 69   Temp (!) 97.5 F (36.4 C) (Oral)   Resp 16   Ht 5\' 1"  (1.549 m)   Wt 145 lb 8 oz (66 kg)   LMP 04/17/1988   SpO2 99%   BMI 27.49 kg/m   General:   Well developed, NAD, BMI noted.  HEENT:  Normocephalic . Face symmetric, atraumatic Neck: No JVD Lungs:  CTA B Normal respiratory effort, no intercostal retractions, no accessory muscle use. Heart: RRR,  no murmur.  Abdomen:  Not distended, soft, non-tender. No rebound or rigidity.   Skin: Not pale. Not jaundice Lower extremities: no pretibial edema bilaterally  Neurologic:  alert & oriented X3.  Speech normal, gait appropriate for age and unassisted Psych--  Cognition and judgment appear intact.  Cooperative with normal attention span and concentration.  Behavior appropriate. No anxious or depressed appearing.     Assessment      Assessment  DM HTN -Losartan d/c d/t hyperkalemia -Amlodipine d/c d/t edema 07-2017 - HCTZ d/c d/t hyperkalemia 08/2017 Hyperlipidemia -- intolerant to lipitor and crestor (severe cramps).  Declined further trial with medications  on 01/16/2018   Anxiety- xanax rx by pcp Vertigo - valium prn GI: --Recurrent diverticulitis (one episode documented by a CT 11-2014) --H. pylori 2001 DJD--back pain, knees, neck pain ( on baclofen for neck) Osteoporosis : Tscore -1.9 on 11-2015 , T score  -1.8 (03/2019); h/o  R ulnar FX 2019 SCC, skin cancer Elevated homocysteine Raynaud's phenomena  NEURO --Carotid US 10-2013: No significant plaque, although ICA's are serpentine, bilaterally. 40-59% bilateral ICA stenosis by velocity criteria likely due to tortuosity. Patent vertebral arteries with antegrade flow.Normal subclavian arteries, bilaterally. -- Transinet dizziness, diplopia sx late 2016 , saw neuro >>> Dx w/ TIA/Stroke on clinical grounds  W/u:  Brain  MRI 03-2015 >> 2 mm saccular aneurysm at the right posterior communicating  artery origin, saw  Neurology, Rx to recheck MRI 1 year, ok continue ASA per neuro CTA head, neck 05-14-2015: aneurysm confirmed, otherwise (-) --brain aneurysm see above, saw neuro 2018.  MRA head 08/2019 stable, recheck 5 years per neuro --Admitted 07-2015, dizziness, MRI negative, DX labyrinthitis   PLAN: Fatigue: As described above, no chest pain or DOE simply tired.  Chart review showing normal labs recently,In the recent past saw cardiology, mostly for palpitation and a history of syncope, work-up unrevealing. Recommend observation, encouraged to continue to stay active but a slower pace. HTN: BP slightly elevated today at home always in the 120s/80s.  Check a CMP, CBC Elevated homocystine: She is worried about her vitamin levels, both a folic acid and B12 are above normal which is not  deleterious.  Homocystine level is a slightly elevated, recommend no change   Time is spent with the patient today 32 minutes, extensive discussion about fatigue, it is likely multifactorial, she was recommended to stay active. We also discussed her recent B12 and folic acid level.  This visit occurred during the SARS-CoV-2 public health emergency.  Safety protocols were in place, including screening questions prior to the visit, additional usage of staff PPE, and extensive cleaning of exam room while observing appropriate contact time as indicated for disinfecting solutions.

## 2020-02-11 LAB — CBC WITH DIFFERENTIAL/PLATELET
Absolute Monocytes: 761 cells/uL (ref 200–950)
Basophils Absolute: 75 cells/uL (ref 0–200)
Basophils Relative: 0.8 %
Eosinophils Absolute: 1062 cells/uL — ABNORMAL HIGH (ref 15–500)
Eosinophils Relative: 11.3 %
HCT: 40.4 % (ref 35.0–45.0)
Hemoglobin: 13.7 g/dL (ref 11.7–15.5)
Lymphs Abs: 2735 cells/uL (ref 850–3900)
MCH: 31.2 pg (ref 27.0–33.0)
MCHC: 33.9 g/dL (ref 32.0–36.0)
MCV: 92 fL (ref 80.0–100.0)
MPV: 11.1 fL (ref 7.5–12.5)
Monocytes Relative: 8.1 %
Neutro Abs: 4766 cells/uL (ref 1500–7800)
Neutrophils Relative %: 50.7 %
Platelets: 306 10*3/uL (ref 140–400)
RBC: 4.39 10*6/uL (ref 3.80–5.10)
RDW: 11.5 % (ref 11.0–15.0)
Total Lymphocyte: 29.1 %
WBC: 9.4 10*3/uL (ref 3.8–10.8)

## 2020-02-11 LAB — BASIC METABOLIC PANEL
BUN/Creatinine Ratio: 26 (calc) — ABNORMAL HIGH (ref 6–22)
BUN: 32 mg/dL — ABNORMAL HIGH (ref 7–25)
CO2: 30 mmol/L (ref 20–32)
Calcium: 9.9 mg/dL (ref 8.6–10.4)
Chloride: 98 mmol/L (ref 98–110)
Creat: 1.25 mg/dL — ABNORMAL HIGH (ref 0.60–0.88)
Glucose, Bld: 95 mg/dL (ref 65–99)
Potassium: 4.7 mmol/L (ref 3.5–5.3)
Sodium: 138 mmol/L (ref 135–146)

## 2020-02-12 NOTE — Assessment & Plan Note (Signed)
Fatigue: As described above, no chest pain or DOE simply tired.  Chart review showing normal labs recently,In the recent past saw cardiology, mostly for palpitation and a history of syncope, work-up unrevealing. Recommend observation, encouraged to continue to stay active but a slower pace. HTN: BP slightly elevated today at home always in the 120s/80s.  Check a CMP, CBC Elevated homocystine: She is worried about her vitamin levels, both a folic acid and U83 are above normal which is not deleterious.  Homocystine level is a slightly elevated, recommend no change

## 2020-02-14 ENCOUNTER — Ambulatory Visit: Payer: Medicare Other

## 2020-02-16 ENCOUNTER — Ambulatory Visit: Payer: Medicare Other | Admitting: Registered"

## 2020-02-20 ENCOUNTER — Ambulatory Visit: Payer: Medicare Other | Admitting: Internal Medicine

## 2020-03-17 ENCOUNTER — Ambulatory Visit: Payer: Medicare Other | Admitting: Registered"

## 2020-03-28 ENCOUNTER — Other Ambulatory Visit: Payer: Self-pay | Admitting: Internal Medicine

## 2020-03-30 ENCOUNTER — Ambulatory Visit: Payer: Medicare Other | Admitting: Dietician

## 2020-04-23 ENCOUNTER — Encounter: Payer: Self-pay | Admitting: Internal Medicine

## 2020-04-23 ENCOUNTER — Ambulatory Visit (INDEPENDENT_AMBULATORY_CARE_PROVIDER_SITE_OTHER): Payer: Medicare Other | Admitting: Internal Medicine

## 2020-04-23 ENCOUNTER — Telehealth: Payer: Self-pay

## 2020-04-23 ENCOUNTER — Other Ambulatory Visit: Payer: Self-pay

## 2020-04-23 VITALS — BP 185/84 | HR 63 | Temp 98.0°F | Resp 16 | Ht 61.0 in | Wt 145.1 lb

## 2020-04-23 DIAGNOSIS — I10 Essential (primary) hypertension: Secondary | ICD-10-CM | POA: Diagnosis not present

## 2020-04-23 DIAGNOSIS — E119 Type 2 diabetes mellitus without complications: Secondary | ICD-10-CM | POA: Diagnosis not present

## 2020-04-23 MED ORDER — INSULIN PEN NEEDLE 31G X 5 MM MISC: 2 refills | Status: DC

## 2020-04-23 MED ORDER — VICTOZA 18 MG/3ML ~~LOC~~ SOPN
PEN_INJECTOR | SUBCUTANEOUS | 3 refills | Status: DC
Start: 2020-04-23 — End: 2020-04-23

## 2020-04-23 MED ORDER — OZEMPIC (0.25 OR 0.5 MG/DOSE) 2 MG/1.5ML ~~LOC~~ SOPN: PEN_INJECTOR | SUBCUTANEOUS | 3 refills | Status: DC

## 2020-04-23 NOTE — Telephone Encounter (Signed)
PA initiated via Covermymeds; KEY: [id number]. PA cancelled by plan.   This medication or product is on your plan's list of covered drugs. Prior authorization is not required at this time. If your pharmacy has questions regarding the processing of your prescription, please have them call the [facility] pharmacy help desk at [phone]. **Please note: Formulary lowering, tiering exception, cost reduction and/or pre-benefit determination review (including prospective Medicare hospice reviews) requests cannot be requested using this method of submission. Please contact us at [phone] instead.

## 2020-04-23 NOTE — Progress Notes (Signed)
Pre visit review using our clinic review tool, if applicable. No additional management support is needed unless otherwise documented below in the visit note. 

## 2020-04-23 NOTE — Progress Notes (Signed)
Subjective:    Patient ID: Patricia Moore, female    DOB: Feb 20, 1934, 87 y.o.   MRN: [id number]  DOS:  04/23/2020 Type of visit - description: Follow-up In general feels well. DM: Blood sugars were elevated, she started to eat better and they have decreased.  Would like to try Victoza. Ambulatory BPs: Typically well controlled.  Review of Systems Still has occasional dizziness, palpitations.  No new symptoms.  Past Medical History:  Diagnosis Date  . Anxiety 12/09/2013  . Arthritis   . Cataract   . Diabetes mellitus   . Diverticulitis    patient states has had three attacks   . Diverticulosis   . DJD (degenerative joint disease) 12/14/2014  . Glaucoma   . H. pylori infection 2001  . Heart murmur   . Hx of adenomatous colonic polyps   . Hyperlipidemia   . Hypertension   . Intraductal papilloma    bx nef x 2 in the 70s-90s  . Saccular aneurysm   . SCC (squamous cell carcinoma) 03/2011   sees dermatology    Past Surgical History:  Procedure Laterality Date  . BREAST BIOPSY  '91, '95   INTRADUCTAL PAPILLOMA  . BREAST LUMPECTOMY    . CATARACT EXTRACTION Bilateral 09/2014  . CLOSED REDUCTION ULNAR SHAFT Right 10/06/2017   Procedure: CLOSED REDUCTION DISLOCATED RIGHT SHOULDER;  Surgeon: Kathryne Hitch, MD;  Location: [facility] OR;  Service: Orthopedics;  Laterality: Right;  . COLONOSCOPY      Allergies as of 04/23/2020      Reactions   Ciprofloxacin Nausea And Vomiting      Medication List       Accurate as of April 23, 2020 11:59 PM. If you have any questions, ask your nurse or doctor.        ALPRAZolam 0.25 MG tablet Commonly known as: XANAX TAKE 1 TABLET(0.25 MG) BY MOUTH DAILY AS NEEDED   augmented betamethasone dipropionate 0.05 % cream Commonly known as: DIPROLENE-AF Apply topically 2 (two) times daily.   baclofen 10 MG tablet Commonly known as: LIORESAL Take 1 tablet (10 mg total) by mouth at bedtime as needed for muscle spasms.   carvedilol 25 MG  tablet Commonly known as: COREG Take 1 tablet (25 mg total) by mouth 2 (two) times daily with a meal.   cholecalciferol 1000 units tablet Commonly known as: VITAMIN D Take 1,000 Units by mouth daily.   co-enzyme Q-10 30 MG capsule Take 30 mg by mouth daily.   dorzolamide-timolol 22.3-6.8 MG/ML ophthalmic solution Commonly known as: COSOPT Place 1 drop into both eyes 2 (two) times daily.   folic acid 1 MG tablet Commonly known as: FOLVITE Take 1 tablet (1 mg total) by mouth daily.   gabapentin 100 MG capsule Commonly known as: Neurontin Take 1 capsule (100 mg total) by mouth at bedtime.   glucose blood test strip Commonly known as: ONE TOUCH ULTRA TEST CHECK BLOOD SUGAR NO MORE THAN 2 TIMES DAILY   glyBURIDE 2.5 MG tablet Commonly known as: DIABETA Take 1 tablet (2.5 mg total) by mouth daily with breakfast.   hyoscyamine 0.125 MG tablet Commonly known as: LEVSIN Take 1 tablet (0.125 mg total) by mouth every 4 (four) hours as needed.   Insulin Pen Needle 31G X 5 MM Misc To use w/ Victoza Started by: Willow Ora, MD   metFORMIN 1000 MG tablet Commonly known as: GLUCOPHAGE TAKE 1 TABLET BY MOUTH WITH BREAKFAST, 1 TABLET WITH LUNCH AND 1/2 TABLET WITH DINNER  nystatin powder Commonly known as: MYCOSTATIN/NYSTOP Apply 1 application topically 2 (two) times daily.   onetouch ultrasoft lancets CHECK BLOOD SUGAR NO MORE THAN 2 TIMES DAILY   Ozempic (0.25 or 0.5 MG/DOSE) 2 MG/1.5ML Sopn Generic drug: Semaglutide(0.25 or 0.5MG /DOS) Inject 0.25mg  into skin once weekly for 4 weeks, then increase to 0.50mg  thereafter Started by: Willow Ora, MD   Restasis 0.05 % ophthalmic emulsion Generic drug: cycloSPORINE   sitaGLIPtin 100 MG tablet Commonly known as: Januvia Take 1 tablet (100 mg total) by mouth daily.   vitamin B-12 250 MCG tablet Commonly known as: CYANOCOBALAMIN Take 250 mcg by mouth daily.          Objective:   Physical Exam BP (!) 185/84 (BP Location: Left  Arm, Patient Position: Sitting, Cuff Size: Small)   Pulse 63   Temp 98 F (36.7 C) (Oral)   Resp 16   Ht 5\' 1"  (1.549 m)   Wt 145 lb 2 oz (65.8 kg)   LMP 04/17/1988   SpO2 98%   BMI 27.42 kg/m  General:   Well developed, NAD, BMI noted. HEENT:  Normocephalic . Face symmetric, atraumatic Lungs:  CTA B Normal respiratory effort, no intercostal retractions, no accessory muscle use. Heart: RRR,  no murmur.  Lower extremities: no pretibial edema bilaterally  Skin: Not pale. Not jaundice Neurologic:  alert & oriented X3.  Speech normal, gait appropriate for age and unassisted Psych--  Cognition and judgment appear intact.  Cooperative with normal attention span and concentration.  Behavior appropriate. No anxious or depressed appearing.      Assessment     Assessment  DM HTN -Losartan d/c d/t hyperkalemia -Amlodipine d/c d/t edema 07-2017 - HCTZ d/c d/t hyperkalemia 08/2017 Hyperlipidemia -- intolerant to lipitor and crestor (severe cramps).  Declined further trial with medications  on 01/16/2018   Anxiety- xanax rx by pcp [facility] - valium prn GI: --Recurrent diverticulitis (one episode documented by a CT 11-2014) --H. pylori 2001 DJD--back pain, knees, neck pain ( on baclofen for neck) Osteoporosis : Tscore -1.9 on 11-2015 , T score  -1.8 (03/2019); h/o  R ulnar FX 2019 SCC, skin cancer Elevated homocysteine Raynaud's phenomena  NEURO --Carotid US 10-2013: No significant plaque, although ICA's are serpentine, bilaterally. 40-59% bilateral ICA stenosis by velocity criteria likely due to tortuosity. Patent vertebral arteries with antegrade flow.Normal subclavian arteries, bilaterally. -- Transinet dizziness, diplopia sx late 2016 , saw neuro >>> Dx w/ TIA/Stroke on clinical grounds  W/u:  Brain  MRI 03-2015 >> 2 mm saccular aneurysm at the right posterior communicating artery origin, saw  Neurology, Rx to recheck MRI 1 year, ok continue ASA per neuro CTA head, neck  05-14-2015: aneurysm confirmed, otherwise (-) --brain aneurysm see above, saw neuro 2018.  MRA head 08/2019 stable, recheck 5 years per neuro --Admitted 07-2015, dizziness, MRI negative, DX labyrinthitis   PLAN: HTN: BP slightly elevated, at home typically okay, continue carvedilol, check a CMP. DM: Currently on glyburide, metformin, Januvia.  Last A1c 7.17 January 2020. CBGs were running up to 300 in the morning, has changed her diet, this morning CBG was 129. Strongly requests to transfer to  Ozempic, she likes the benefit of weight loss. Prescription sent, Ozempic, 0.25 once a week x4, then 0.5 mg once weekly. if it is covered by her insurance, okay to start.  See AVS.  She will have to stop Januvia and glyburide, she verbalized understanding Check A1c.  This visit occurred during the SARS-CoV-2 public health emergency.  Safety protocols were in place, including screening questions prior to the visit, additional usage of staff PPE, and extensive cleaning of exam room while observing appropriate contact time as indicated for disinfecting solutions.

## 2020-04-23 NOTE — Patient Instructions (Addendum)
  GO TO THE LAB : Get the blood work     Once you get the actual Victoza injection, please set up appointment to see my nurse to be taught how to use the injection.  Once you start Victoza you need to stop Januvia and Glyburide.  Watch for Victoza side effects, watch your blood sugars to be sure they remain very good.

## 2020-04-24 LAB — COMPREHENSIVE METABOLIC PANEL
AG Ratio: 1.3 (calc) (ref 1.0–2.5)
ALT: 20 U/L (ref 6–29)
AST: 26 U/L (ref 10–35)
Albumin: 4.7 g/dL (ref 3.6–5.1)
Alkaline phosphatase (APISO): 73 U/L (ref 37–153)
BUN/Creatinine Ratio: 25 (calc) — ABNORMAL HIGH (ref 6–22)
BUN: 25 mg/dL (ref 7–25)
CO2: 28 mmol/L (ref 20–32)
Calcium: 10.4 mg/dL (ref 8.6–10.4)
Chloride: 98 mmol/L (ref 98–110)
Creat: 1 mg/dL — ABNORMAL HIGH (ref 0.60–0.88)
Globulin: 3.5 g/dL (calc) (ref 1.9–3.7)
Glucose, Bld: 119 mg/dL — ABNORMAL HIGH (ref 65–99)
Potassium: 5.2 mmol/L (ref 3.5–5.3)
Sodium: 134 mmol/L — ABNORMAL LOW (ref 135–146)
Total Bilirubin: 0.4 mg/dL (ref 0.2–1.2)
Total Protein: 8.2 g/dL — ABNORMAL HIGH (ref 6.1–8.1)

## 2020-04-24 LAB — HEMOGLOBIN A1C
Hgb A1c MFr Bld: 7.3 % of total Hgb — ABNORMAL HIGH (ref <5.7)
Mean Plasma Glucose: 163 mg/dL
eAG (mmol/L): 9 mmol/L

## 2020-04-25 NOTE — Assessment & Plan Note (Addendum)
HTN: BP slightly elevated, at home typically okay, continue carvedilol, check a CMP. DM: Currently on glyburide, metformin, Januvia.  Last A1c 7.17 January 2020. CBGs were running up to 300 in the morning, has changed her diet, this morning CBG was 129. Strongly requests to transfer to  [place], she likes the benefit of weight loss. Prescription sent, Ozempic, 0.25 once a week x4, then 0.5 mg once weekly. if it is covered by her insurance, okay to start.  See AVS.  She will have to stop Januvia and glyburide, she verbalized understanding Check A1c.

## 2020-04-28 ENCOUNTER — Other Ambulatory Visit: Payer: Self-pay | Admitting: Internal Medicine

## 2020-04-28 DIAGNOSIS — D485 Neoplasm of uncertain behavior of skin: Secondary | ICD-10-CM | POA: Diagnosis not present

## 2020-04-28 DIAGNOSIS — Z85828 Personal history of other malignant neoplasm of skin: Secondary | ICD-10-CM | POA: Diagnosis not present

## 2020-04-28 DIAGNOSIS — L820 Inflamed seborrheic keratosis: Secondary | ICD-10-CM | POA: Diagnosis not present

## 2020-04-28 DIAGNOSIS — L309 Dermatitis, unspecified: Secondary | ICD-10-CM | POA: Diagnosis not present

## 2020-04-29 ENCOUNTER — Other Ambulatory Visit: Payer: Self-pay | Admitting: Internal Medicine

## 2020-05-02 ENCOUNTER — Other Ambulatory Visit: Payer: Self-pay | Admitting: Obstetrics & Gynecology

## 2020-05-04 ENCOUNTER — Other Ambulatory Visit: Payer: Self-pay

## 2020-05-04 ENCOUNTER — Ambulatory Visit (INDEPENDENT_AMBULATORY_CARE_PROVIDER_SITE_OTHER): Payer: Medicare Other

## 2020-05-04 DIAGNOSIS — E119 Type 2 diabetes mellitus without complications: Secondary | ICD-10-CM | POA: Diagnosis not present

## 2020-05-04 NOTE — Telephone Encounter (Signed)
Spoke with patient regarding refill for nystatin powder. Patient states she does not need a refill at this time, will call if needed. Rx  Denied.   Last AEX 02/21/19, requesting to schedule next AEX. AEX scheduled for 02/23/21 at 11am with Dr. Dellis Filbert. Patient verbalizes understanding and is agreeable.   Encounter closed.

## 2020-05-04 NOTE — Progress Notes (Signed)
Patient here for diabetic teaching for use of her ozempic. Sig states inject 0.25 mg one a week for 4 weeks. Patient is to increase up to 0.5 mg weekly after 4 weeks are up. Gave patient first dose in office. Patient tolerated well.

## 2020-05-28 ENCOUNTER — Telehealth: Payer: Self-pay | Admitting: Cardiology

## 2020-05-28 NOTE — Telephone Encounter (Signed)
Pt c/o of Chest Pain: STAT if CP now or developed within 24 hours  1. Are you having CP right now? no  2. Are you experiencing any other symptoms (ex. SOB, nausea, vomiting, sweating)? Snoring, nausea, pain in back and a little bit dizziness, nose bleeds, but is not having any symptoms now  3. How long have you been experiencing CP? 2 and a half weeks   4. Is your CP continuous or coming and going? Comes and goes  5. Have you taken Nitroglycerin? no   Patient states she has been having chest tightness for about 2 and a half weeks. She states it comes and goes and is not having symptoms now. She states she has also noticed she made snoring noises at night, has had a little bit of dizziness, back pain, and has had 2 nose bleeds. She states she is not having any symptoms now.

## 2020-05-28 NOTE — Telephone Encounter (Signed)
Spoke with patient. Patient had some discomfort last night in her chest but it went away after she laid still for a little while. She reports she doesn't know what her BP was but that she could tell it was high. She didn't want to get up and check it because she was afraid she would get dizzy and fall. She had a nose bleed last night as well.   The last episode of chest pain prior to this was 2.5 weeks ago. The pain just comes on, nothing aggravates the pain. Rest eases the pain. When she gets chest pain she has nausea, some slight shortness of breath and dizziness.   No blood pressure or pulse to assess. Advised to check blood pressure and heart rate twice a day until appointment on 06/02/20 and bring to appointment for review. Advised if chest pain comes back to report to ER for evaluation. Patient verbalized understanding and will call back with any further concerns. Will route to MD as well. Patient sees Kerin Ransom on 06/02/20.

## 2020-06-02 ENCOUNTER — Other Ambulatory Visit: Payer: Self-pay

## 2020-06-02 ENCOUNTER — Ambulatory Visit: Payer: Medicare Other | Admitting: Cardiology

## 2020-06-02 ENCOUNTER — Encounter: Payer: Self-pay | Admitting: Cardiology

## 2020-06-02 VITALS — BP 120/72 | HR 86 | Ht 61.0 in | Wt 146.8 lb

## 2020-06-02 DIAGNOSIS — I10 Essential (primary) hypertension: Secondary | ICD-10-CM | POA: Diagnosis not present

## 2020-06-02 DIAGNOSIS — R002 Palpitations: Secondary | ICD-10-CM

## 2020-06-02 DIAGNOSIS — R011 Cardiac murmur, unspecified: Secondary | ICD-10-CM

## 2020-06-02 DIAGNOSIS — I340 Nonrheumatic mitral (valve) insufficiency: Secondary | ICD-10-CM | POA: Diagnosis not present

## 2020-06-02 DIAGNOSIS — E119 Type 2 diabetes mellitus without complications: Secondary | ICD-10-CM | POA: Diagnosis not present

## 2020-06-02 NOTE — Assessment & Plan Note (Signed)
Check f/u echo

## 2020-06-02 NOTE — Assessment & Plan Note (Signed)
The episode she describes is c/w PSVT

## 2020-06-02 NOTE — Assessment & Plan Note (Signed)
Normal LVF, mild LVH, mod LAE June 2020 Controlled

## 2020-06-02 NOTE — Patient Instructions (Signed)
Medication Instructions:  Continue current medications  *If you need a refill on your cardiac medications before your next appointment, please call your pharmacy*   Lab Work: None Ordered   Testing/Procedures: Your physician has requested that you have an echocardiogram. Echocardiography is a painless test that uses sound waves to create images of your heart. It provides your doctor with information about the size and shape of your heart and how well your heart's chambers and valves are working. This procedure takes approximately one hour. There are no restrictions for this procedure.   Follow-Up: At [facility], you and your health needs are our priority.  As part of our continuing mission to provide you with exceptional heart care, we have created designated Provider Care Teams.  These Care Teams include your primary Cardiologist (physician) and Advanced Practice Providers (APPs -  Physician Assistants and Nurse Practitioners) who all work together to provide you with the care you need, when you need it.  We recommend signing up for the patient portal called "MyChart".  Sign up information is provided on this After Visit Summary.  MyChart is used to connect with patients for Virtual Visits (Telemedicine).  Patients are able to view lab/test results, encounter notes, upcoming appointments, etc.  Non-urgent messages can be sent to your provider as well.   To learn more about what you can do with MyChart, go to [email or web address].    Your next appointment:   6 month(s)  The format for your next appointment:   In Person  Provider:   You may see Minus Breeding, MD or one of the following Advanced Practice Providers on your designated Care Team:    Rosaria Ferries, PA-C  Jory Sims, DNP, ANP

## 2020-06-02 NOTE — Progress Notes (Signed)
Cardiology Office Note:    Date:  06/02/2020   ID:  Patricia Moore, Patricia Moore 07-22-1933, MRN [id number]  PCP:  Wanda Plump, MD  Cardiologist:  Rollene Rotunda, MD  Electrophysiologist:  None   Referring MD: Wanda Plump, MD   CC: palpitations  History of Present Illness:    Patricia Moore is a pleasant though somewhat anxious 87 y.o. female with a hx of HTN, DM, and mild MR. Patient is in the office today for routine follow-up.  She was last contacted by Dr. Antoine Poche as a virtual visit in April 2020.  Patient has had an echo in June 2020 that showed an ejection fraction of 60 to 65% with moderate left atrial enlargement mitral valve calcification and mild MR.  Dr. Antoine Poche did recommend following this up at some point.  Since then the patient is done well from a cardiac standpoint.  She tells me about a month ago she woke up with some palpitations that lasted only a few seconds.  It did make her quite anxious though.  She has occasional brief palpitations during the day.  A monitor was done in April 2021 that demonstrated PSVT that lasted 12 seconds.  She is on carvedilol 25 mg twice daily and Dr. Antoine Poche recommended no further treatment for this.  The patient is concerned about her prior echo findings.  She wanted to make sure there was no progression of her mitral regurgitation or calcification.  Past Medical History:  Diagnosis Date  . Anxiety 12/09/2013  . Arthritis   . Cataract   . Diabetes mellitus   . Diverticulitis    patient states has had three attacks   . Diverticulosis   . DJD (degenerative joint disease) 12/14/2014  . Glaucoma   . H. pylori infection 2001  . Heart murmur   . Hx of adenomatous colonic polyps   . Hyperlipidemia   . Hypertension   . Intraductal papilloma    bx nef x 2 in the 70s-90s  . Saccular aneurysm   . SCC (squamous cell carcinoma) 03/2011   sees dermatology    Past Surgical History:  Procedure Laterality Date  . BREAST BIOPSY  '91, '95   INTRADUCTAL  PAPILLOMA  . BREAST LUMPECTOMY    . CATARACT EXTRACTION Bilateral 09/2014  . CLOSED REDUCTION ULNAR SHAFT Right 10/06/2017   Procedure: CLOSED REDUCTION DISLOCATED RIGHT SHOULDER;  Surgeon: Kathryne Hitch, MD;  Location: [facility] OR;  Service: Orthopedics;  Laterality: Right;  . COLONOSCOPY      Current Medications: Current Meds  Medication Sig  . ALPRAZolam (XANAX) 0.25 MG tablet TAKE 1 TABLET(0.25 MG) BY MOUTH DAILY AS NEEDED  . augmented betamethasone dipropionate (DIPROLENE-AF) 0.05 % cream Apply topically 2 (two) times daily.  . baclofen (LIORESAL) 10 MG tablet Take 1 tablet (10 mg total) by mouth at bedtime as needed for muscle spasms.  . carvedilol (COREG) 25 MG tablet Take 1 tablet (25 mg total) by mouth 2 (two) times daily with a meal.  . cholecalciferol (VITAMIN D) 1000 UNITS tablet Take 1,000 Units by mouth daily.  Marland Kitchen co-enzyme Q-10 30 MG capsule Take 30 mg by mouth daily.  . dorzolamide-timolol (COSOPT) 22.3-6.8 MG/ML ophthalmic solution Place 1 drop into both eyes 2 (two) times daily.   . folic acid (FOLVITE) 1 MG tablet Take 1 tablet (1 mg total) by mouth daily.  Marland Kitchen gabapentin (NEURONTIN) 100 MG capsule Take 1 capsule (100 mg total) by mouth at bedtime.  . hyoscyamine (LEVSIN) 0.125  MG tablet Take 1 tablet (0.125 mg total) by mouth every 4 (four) hours as needed.  . Insulin Pen Needle 31G X 5 MM MISC To use w/ Victoza  . Lancets (ONETOUCH ULTRASOFT) lancets CHECK BLOOD SUGAR NO MORE THAN 2 TIMES PER DAY  . metFORMIN (GLUCOPHAGE) 1000 MG tablet TAKE 1 TABLET BY MOUTH WITH BREAKFAST, 1 TABLET WITH LUNCH AND 1/2 TABLET WITH DINNER  . nystatin (MYCOSTATIN/NYSTOP) powder Apply 1 application topically 2 (two) times daily.  Letta Pate ULTRA test strip CHECK BLOOD SUGAR NO MORE THAN 2 TIMES DAILY  . RESTASIS 0.05 % ophthalmic emulsion   . Semaglutide,0.25 or 0.5MG /DOS, (OZEMPIC, 0.25 OR 0.5 MG/DOSE,) 2 MG/1.5ML SOPN Inject 0.25mg  into skin once weekly for 4 weeks, then increase to  0.50mg  thereafter  . vitamin B-12 (CYANOCOBALAMIN) 250 MCG tablet Take 250 mcg by mouth daily.  . [DISCONTINUED] glyBURIDE (DIABETA) 2.5 MG tablet Take 1 tablet (2.5 mg total) by mouth daily with breakfast.  . [DISCONTINUED] sitaGLIPtin (JANUVIA) 100 MG tablet Take 1 tablet (100 mg total) by mouth daily.     Allergies:   Ciprofloxacin   Social History   Socioeconomic History  . Marital status: Married    Spouse name: Not on file  . Number of children: 1  . Years of education: Not on file  . Highest education level: Not on file  Occupational History  . 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 Smoking status: Never Smoker  . Smokeless tobacco: Never Used  Vaping Use  . Vaping Use: Never used  Substance and Sexual Activity  . Alcohol use: Never  . Drug use: No  . Sexual activity: Not Currently  Other Topics Concern  . Not on file  Social History Narrative   Born in [place], ,moved to the [place] in the 60s, live in [place] then in [place]   Lives w/ husband   H.S. Buyer, retail   Son lives in [place])    Social Determinants of Health   Financial Resource Strain: Low Risk   . Difficulty of Paying Living Expenses: Not hard at all  Food Insecurity: No Food Insecurity  . Worried About Programme researcher, broadcasting/film/video in the Last Year: Never true  . Ran Out of Food in the Last Year: Never true  Transportation Needs: No Transportation Needs  . Lack of Transportation (Medical): No  . Lack of Transportation (Non-Medical): No  Physical Activity: Not on file  Stress: Not on file  Social Connections: Not on file     Family History: The patient's family history includes Breast cancer in her maternal aunt and maternal aunt; Cancer in her sister; Colon cancer in her maternal grandfather and paternal uncle; Heart disease (age of onset: 23) in her mother; Heart disease (age of onset: 68) in her father; Stroke in her maternal aunt. There is no history of Hypertension, Diabetes, Esophageal cancer,  Rectal cancer, or Stomach cancer. She was adopted.  ROS:   Please see the history of present illness.     All other systems reviewed and are negative.  EKGs/Labs/Other Studies Reviewed:    The following studies were reviewed today: Monitor 08/05/2019- NSR NSVT four beats Runs of SVT with the longest being 12 beats. No sustained arrhythmias.   The patient triggered events seemed to occur during NSR with rare PVCs.  Echo 10/15/2018- IMPRESSIONS    1. The left ventricle has normal systolic function with an ejection  fraction of 60-65%. The cavity size  was normal. There is mildly increased  left ventricular wall thickness. Left ventricular diastolic Doppler  parameters are consistent with impaired  relaxation.  2. The right ventricle has normal systolic function. The cavity was  normal. There is no increase in right ventricular wall thickness.  3. Left atrial size was moderately dilated.  4. There is mild mitral annular calcification present  POET 12/10/2018-  There was no ST segment deviation noted during stress.     EKG:  EKG is ordered today.  The ekg ordered today demonstrates NSR, irregular baseline, HR 70  Recent Labs: 07/28/2019: TSH 0.84 02/10/2020: Hemoglobin 13.7; Platelets 306 04/23/2020: ALT 20; BUN 25; Creat 1.00; Potassium 5.2; Sodium 134  Recent Lipid Panel    Component Value Date/Time   CHOL 198 06/12/2019 1006   TRIG 108.0 06/12/2019 1006   HDL 63.10 06/12/2019 1006   CHOLHDL 3 06/12/2019 1006   VLDL 21.6 06/12/2019 1006   LDLCALC 113 (H) 06/12/2019 1006   LDLDIRECT 131.9 10/28/2010 1103    Physical Exam:    VS:  BP 120/72   Pulse 86   Ht 5\' 1"  (1.549 m)   Wt 146 lb 12.8 oz (66.6 kg)   LMP 04/17/1988   SpO2 99%   BMI 27.74 kg/m     Wt Readings from Last 3 Encounters:  06/02/20 146 lb 12.8 oz (66.6 kg)  04/23/20 145 lb 2 oz (65.8 kg)  02/10/20 145 lb 8 oz (66 kg)     GEN: Well nourished, well developed in no acute distress HEENT:  Normal NECK: No JVD; No carotid bruits CARDIAC: RRR, no murmurs, rubs, gallops extra systole noted RESPIRATORY:  Clear to auscultation without rales, wheezing or rhonchi  ABDOMEN: Soft, non-tender, non-distended MUSCULOSKELETAL:  No edema; No deformity  SKIN: Warm and dry NEUROLOGIC:  Alert and oriented x 3 PSYCHIATRIC:  Normal affect   ASSESSMENT:    Palpitations The episode she describes is c/w PSVT  Essential hypertension Normal LVF, mild LVH, mod LAE June 2020 Controlled  DM II (diabetes mellitus, type II), controlled PCP follows  Mitral valve insufficiency Check f/u echo  PLAN:    Reassured that we would not change current Rx unless her palpitations were more frequent or more prolonged. Check echo.  F/U Dr Antoine Poche in 6 months.    Medication Adjustments/Labs and Tests Ordered: Current medicines are reviewed at length with the patient today.  Concerns regarding medicines are outlined above.  Orders Placed This Encounter  Procedures  . EKG 12-Lead  . ECHOCARDIOGRAM COMPLETE   No orders of the defined types were placed in this encounter.   Patient Instructions  Medication Instructions:  Continue current medications  *If you need a refill on your cardiac medications before your next appointment, please call your pharmacy*   Lab Work: None Ordered   Testing/Procedures: Your physician has requested that you have an echocardiogram. Echocardiography is a painless test that uses sound waves to create images of your heart. It provides your doctor with information about the size and shape of your heart and how well your heart's chambers and valves are working. This procedure takes approximately one hour. There are no restrictions for this procedure.   Follow-Up: At [facility], you and your health needs are our priority.  As part of our continuing mission to provide you with exceptional heart care, we have created designated Provider Care Teams.  These Care Teams  include your primary Cardiologist (physician) and Advanced Practice Providers (APPs -  Physician Assistants and  Nurse Practitioners) who all work together to provide you with the care you need, when you need it.  We recommend signing up for the patient portal called "MyChart".  Sign up information is provided on this After Visit Summary.  MyChart is used to connect with patients for Virtual Visits (Telemedicine).  Patients are able to view lab/test results, encounter notes, upcoming appointments, etc.  Non-urgent messages can be sent to your provider as well.   To learn more about what you can do with MyChart, go to [email or web address].    Your next appointment:   6 month(s)  The format for your next appointment:   In Person  Provider:   You may see Rollene Rotunda, MD or one of the following Advanced Practice Providers on your designated Care Team:    Theodore Demark, PA-C  Joni Reining, DNP, ANP         Signed, Corine Shelter, PA-C  06/02/2020 11:08 AM    [facility]

## 2020-06-02 NOTE — Assessment & Plan Note (Signed)
PCP follows 

## 2020-06-22 ENCOUNTER — Other Ambulatory Visit: Payer: Self-pay

## 2020-06-22 ENCOUNTER — Ambulatory Visit (HOSPITAL_COMMUNITY): Payer: Medicare Other | Attending: Cardiovascular Disease

## 2020-06-22 DIAGNOSIS — R002 Palpitations: Secondary | ICD-10-CM

## 2020-06-22 DIAGNOSIS — R011 Cardiac murmur, unspecified: Secondary | ICD-10-CM | POA: Diagnosis not present

## 2020-06-22 LAB — ECHOCARDIOGRAM COMPLETE
Area-P 1/2: 3.63 cm2
MV M vel: 5.66 m/s
MV Peak grad: 128.1 mmHg
MV VTI: 1.49 cm2
S' Lateral: 2.5 cm

## 2020-06-28 NOTE — Progress Notes (Signed)
Cardiology Office Note   Date:  06/29/2020   ID:  Patricia, Moore 1933/05/12, MRN [id number]  PCP:  Wanda Plump, MD  Cardiologist:   Rollene Rotunda, MD   Chief Complaint  Patient presents with  . Palpitations      History of Present Illness: Patricia Moore is a 87 y.o. female who presents for follow up of a syncope.   This happened on February 2017.  She woke up and she felt dizzy.  She has been treated with meclizine.   She has been treated for labyrinthitis as well.  She had a normal echo.  She did have monitor in 2018 that demonstrated NSVT.    She had a negative POET (Plain Old Exercise Treadmill).  She has had brief runs of PSVT.   She does have continued episodes of nonsustained tachypalpitations.  These are similar to when she wore the monitor in 2021 the may be a little more frequent.  She does not bring them on with activities.  They are short-lived and she does not have associated symptoms such as presyncope or syncope.  She is not having any chest pressure, neck or arm discomfort.  She is not having any weight gain or edema.  Of note she was noted to have a normal ejection fraction recently on echo which she did have some moderate mitral regurgitation.   Past Medical History:  Diagnosis Date  . Anxiety 12/09/2013  . Arthritis   . Cataract   . Diabetes mellitus   . Diverticulitis    patient states has had three attacks   . Diverticulosis   . DJD (degenerative joint disease) 12/14/2014  . Glaucoma   . H. pylori infection 2001  . Heart murmur   . Hx of adenomatous colonic polyps   . Hyperlipidemia   . Hypertension   . Intraductal papilloma    bx nef x 2 in the 70s-90s  . Saccular aneurysm   . SCC (squamous cell carcinoma) 03/2011   sees dermatology    Past Surgical History:  Procedure Laterality Date  . BREAST BIOPSY  '91, '95   INTRADUCTAL PAPILLOMA  . BREAST LUMPECTOMY    . CATARACT EXTRACTION Bilateral 09/2014  . CLOSED REDUCTION ULNAR SHAFT Right  10/06/2017   Procedure: CLOSED REDUCTION DISLOCATED RIGHT SHOULDER;  Surgeon: Kathryne Hitch, MD;  Location: [facility] OR;  Service: Orthopedics;  Laterality: Right;  . COLONOSCOPY       Current Outpatient Medications  Medication Sig Dispense Refill  . ALPRAZolam (XANAX) 0.25 MG tablet TAKE 1 TABLET(0.25 MG) BY MOUTH DAILY AS NEEDED 30 tablet 2  . carvedilol (COREG) 25 MG tablet Take 1 tablet (25 mg total) by mouth 2 (two) times daily with a meal. 180 tablet 1  . cholecalciferol (VITAMIN D) 1000 UNITS tablet Take 1,000 Units by mouth daily.    Marland Kitchen co-enzyme Q-10 30 MG capsule Take 30 mg by mouth daily.    . dorzolamide-timolol (COSOPT) 22.3-6.8 MG/ML ophthalmic solution Place 1 drop into both eyes 2 (two) times daily.     . Insulin Pen Needle 31G X 5 MM MISC To use w/ Victoza 100 each 2  . Lancets (ONETOUCH ULTRASOFT) lancets CHECK BLOOD SUGAR NO MORE THAN 2 TIMES PER DAY 200 each 12  . metFORMIN (GLUCOPHAGE) 1000 MG tablet TAKE 1 TABLET BY MOUTH WITH BREAKFAST, 1 TABLET WITH LUNCH AND 1/2 TABLET WITH DINNER 75 tablet 5  . ONETOUCH ULTRA test strip CHECK BLOOD SUGAR NO MORE  THAN 2 TIMES DAILY 200 strip 12  . RESTASIS 0.05 % ophthalmic emulsion     . Semaglutide,0.25 or 0.5MG /DOS, (OZEMPIC, 0.25 OR 0.5 MG/DOSE,) 2 MG/1.5ML SOPN Inject 0.25mg  into skin once weekly for 4 weeks, then increase to 0.50mg  thereafter 1.5 mL 3  . vitamin B-12 (CYANOCOBALAMIN) 250 MCG tablet Take 250 mcg by mouth daily.     No current facility-administered medications for this visit.    Allergies:   Ciprofloxacin    ROS:  Please see the history of present illness.   Otherwise, review of systems are positive for none.   All other systems are reviewed and negative.    PHYSICAL EXAM: VS:  BP (!) 156/82   Pulse 79   Ht 5\' 1"  (1.549 m)   Wt 142 lb 9.6 oz (64.7 kg)   LMP 04/17/1988   SpO2 98%   BMI 26.94 kg/m  , BMI Body mass index is 26.94 kg/m. GENERAL:  Well appearing NECK:  No jugular venous distention,  waveform within normal limits, carotid upstroke brisk and symmetric, no bruits, no thyromegaly LUNGS:  Clear to auscultation bilaterally CHEST:  Unremarkable HEART:  PMI not displaced or sustained,S1 and S2 within normal limits, no S3, no S4, no clicks, no rubs, 2 out of 6 apical systolic murmur radiating slightly to the axilla, no diastolic murmurs ABD:  Flat, positive bowel sounds normal in frequency in pitch, no bruits, no rebound, no guarding, no midline pulsatile mass, no hepatomegaly, no splenomegaly EXT:  2 plus pulses throughout, no edema, no cyanosis no clubbing   EKG:  EKG is not ordered today.    Recent Labs: 07/28/2019: TSH 0.84 02/10/2020: Hemoglobin 13.7; Platelets 306 04/23/2020: ALT 20; BUN 25; Creat 1.00; Potassium 5.2; Sodium 134    Lipid Panel    Component Value Date/Time   CHOL 198 06/12/2019 1006   TRIG 108.0 06/12/2019 1006   HDL 63.10 06/12/2019 1006   CHOLHDL 3 06/12/2019 1006   VLDL 21.6 06/12/2019 1006   LDLCALC 113 (H) 06/12/2019 1006   LDLDIRECT 131.9 10/28/2010 1103      Wt Readings from Last 3 Encounters:  06/29/20 142 lb 9.6 oz (64.7 kg)  06/02/20 146 lb 12.8 oz (66.6 kg)  04/23/20 145 lb 2 oz (65.8 kg)      Other studies Reviewed: Additional studies/ records that were reviewed today include: echo. Review of the above records demonstrates: See elsewhere   ASSESSMENT AND PLAN:   PALPITATIONS:    She would prefer no further therapy.  We discussed as needed Cardizem but she would rather not.  Her symptoms are the same as they were last year when she wore a monitor so I do not think further testing is indicated.  She is up-to-date with electrolytes.  TSH was normal last year.  She will let me know if these worsen and I will manage her symptomatically.   MR: She has some mitral regurgitation which I will follow with echocardiography next year.  HTN:   Her blood pressure is elevated today but she says it is always in the 120s over 70s systolic  and she is going to get a new blood pressure cuff and keep a diary and present those to me.    Current medicines are reviewed at length with the patient today.  The patient does not have concerns and correct regarding medicines.  The following changes have been made: No change  Labs/ tests ordered today include:   Orders Placed This Encounter  Procedures  .  ECHOCARDIOGRAM COMPLETE     Disposition:   FU with me in 1 year .     Signed, Rollene Rotunda, MD  06/29/2020 3:48 PM    [facility]

## 2020-06-29 ENCOUNTER — Other Ambulatory Visit: Payer: Self-pay

## 2020-06-29 ENCOUNTER — Ambulatory Visit (INDEPENDENT_AMBULATORY_CARE_PROVIDER_SITE_OTHER): Payer: Medicare Other | Admitting: Cardiology

## 2020-06-29 ENCOUNTER — Encounter: Payer: Self-pay | Admitting: Cardiology

## 2020-06-29 VITALS — BP 156/82 | HR 79 | Ht 61.0 in | Wt 142.6 lb

## 2020-06-29 DIAGNOSIS — R002 Palpitations: Secondary | ICD-10-CM | POA: Diagnosis not present

## 2020-06-29 DIAGNOSIS — R0602 Shortness of breath: Secondary | ICD-10-CM | POA: Diagnosis not present

## 2020-06-29 DIAGNOSIS — I340 Nonrheumatic mitral (valve) insufficiency: Secondary | ICD-10-CM

## 2020-06-29 DIAGNOSIS — I10 Essential (primary) hypertension: Secondary | ICD-10-CM

## 2020-06-29 NOTE — Patient Instructions (Signed)
Medication Instructions:  Continue current medications  *If you need a refill on your cardiac medications before your next appointment, please call your pharmacy*   Lab Work: None Ordered   Testing/Procedures: Your physician has requested that you have an echocardiogram March 2023. Echocardiography is a painless test that uses sound waves to create images of your heart. It provides your doctor with information about the size and shape of your heart and how well your heart's chambers and valves are working. This procedure takes approximately one hour. There are no restrictions for this procedure.   Follow-Up: At [facility], you and your health needs are our priority.  As part of our continuing mission to provide you with exceptional heart care, we have created designated Provider Care Teams.  These Care Teams include your primary Cardiologist (physician) and Advanced Practice Providers (APPs -  Physician Assistants and Nurse Practitioners) who all work together to provide you with the care you need, when you need it.  We recommend signing up for the patient portal called "MyChart".  Sign up information is provided on this After Visit Summary.  MyChart is used to connect with patients for Virtual Visits (Telemedicine).  Patients are able to view lab/test results, encounter notes, upcoming appointments, etc.  Non-urgent messages can be sent to your provider as well.   To learn more about what you can do with MyChart, go to [email or web address].    Your next appointment:   1 year(s)  The format for your next appointment:   In Person  Provider:   You may see Minus Breeding, MD or one of the following Advanced Practice Providers on your designated Care Team:    Rosaria Ferries, PA-C  Jory Sims, DNP, ANP

## 2020-07-07 ENCOUNTER — Other Ambulatory Visit: Payer: Self-pay | Admitting: Internal Medicine

## 2020-07-09 ENCOUNTER — Other Ambulatory Visit: Payer: Self-pay | Admitting: Internal Medicine

## 2020-07-10 ENCOUNTER — Other Ambulatory Visit: Payer: Self-pay | Admitting: Internal Medicine

## 2020-07-16 ENCOUNTER — Emergency Department (HOSPITAL_BASED_OUTPATIENT_CLINIC_OR_DEPARTMENT_OTHER)
Admission: EM | Admit: 2020-07-16 | Discharge: 2020-07-16 | Disposition: A | Discharge status: Home / Self Care | Payer: Medicare Other | Attending: Emergency Medicine | Admitting: Emergency Medicine

## 2020-07-16 ENCOUNTER — Other Ambulatory Visit: Payer: Self-pay

## 2020-07-16 DIAGNOSIS — E119 Type 2 diabetes mellitus without complications: Secondary | ICD-10-CM | POA: Diagnosis not present

## 2020-07-16 DIAGNOSIS — Z79899 Other long term (current) drug therapy: Secondary | ICD-10-CM | POA: Insufficient documentation

## 2020-07-16 DIAGNOSIS — Z794 Long term (current) use of insulin: Secondary | ICD-10-CM | POA: Diagnosis not present

## 2020-07-16 DIAGNOSIS — Z7984 Long term (current) use of oral hypoglycemic drugs: Secondary | ICD-10-CM | POA: Insufficient documentation

## 2020-07-16 DIAGNOSIS — R040 Epistaxis: Secondary | ICD-10-CM | POA: Diagnosis not present

## 2020-07-16 DIAGNOSIS — I10 Essential (primary) hypertension: Secondary | ICD-10-CM | POA: Diagnosis not present

## 2020-07-16 MED ORDER — OXYMETAZOLINE HCL 0.05 % NA SOLN: 1.0000 | Freq: Once | NASAL | Status: DC

## 2020-07-16 MED ORDER — TRANEXAMIC ACID 1000 MG/10ML IV SOLN
500.0000 mg | Freq: Once | INTRAVENOUS | Status: DC
  Filled 2020-07-16: qty 10

## 2020-07-16 NOTE — ED Triage Notes (Signed)
C/o nosebleed started today. NAD in triage.

## 2020-07-16 NOTE — Discharge Instructions (Signed)
Follow-up with your primary doctor for recheck regarding your nosebleed and monitoring of your blood pressure.  If you have recurrent nosebleed, apply direct pressure.  If direct pressure does not stop nosebleeding within 15 to 20 minutes, come back to ER for reassessment.
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
## 2020-07-19 ENCOUNTER — Encounter: Payer: Self-pay | Admitting: Internal Medicine

## 2020-07-19 DIAGNOSIS — E119 Type 2 diabetes mellitus without complications: Secondary | ICD-10-CM | POA: Diagnosis not present

## 2020-07-19 DIAGNOSIS — H02834 Dermatochalasis of left upper eyelid: Secondary | ICD-10-CM | POA: Diagnosis not present

## 2020-07-19 DIAGNOSIS — H02831 Dermatochalasis of right upper eyelid: Secondary | ICD-10-CM | POA: Diagnosis not present

## 2020-07-19 DIAGNOSIS — H401131 Primary open-angle glaucoma, bilateral, mild stage: Secondary | ICD-10-CM | POA: Diagnosis not present

## 2020-07-19 DIAGNOSIS — Z961 Presence of intraocular lens: Secondary | ICD-10-CM | POA: Diagnosis not present

## 2020-07-19 DIAGNOSIS — H04123 Dry eye syndrome of bilateral lacrimal glands: Secondary | ICD-10-CM | POA: Diagnosis not present

## 2020-07-19 DIAGNOSIS — H527 Unspecified disorder of refraction: Secondary | ICD-10-CM | POA: Diagnosis not present

## 2020-07-19 DIAGNOSIS — H02401 Unspecified ptosis of right eyelid: Secondary | ICD-10-CM | POA: Diagnosis not present

## 2020-07-19 DIAGNOSIS — H532 Diplopia: Secondary | ICD-10-CM | POA: Diagnosis not present

## 2020-07-19 DIAGNOSIS — S0231XA Fracture of orbital floor, right side, initial encounter for closed fracture: Secondary | ICD-10-CM | POA: Diagnosis not present

## 2020-07-19 DIAGNOSIS — H0100A Unspecified blepharitis right eye, upper and lower eyelids: Secondary | ICD-10-CM | POA: Diagnosis not present

## 2020-07-19 DIAGNOSIS — H43812 Vitreous degeneration, left eye: Secondary | ICD-10-CM | POA: Diagnosis not present

## 2020-07-19 DIAGNOSIS — H0100B Unspecified blepharitis left eye, upper and lower eyelids: Secondary | ICD-10-CM | POA: Diagnosis not present

## 2020-07-19 LAB — HM DIABETES EYE EXAM
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
## 2020-07-20 ENCOUNTER — Other Ambulatory Visit: Payer: Self-pay

## 2020-07-20 ENCOUNTER — Encounter: Payer: Self-pay | Admitting: Internal Medicine

## 2020-07-20 ENCOUNTER — Ambulatory Visit (INDEPENDENT_AMBULATORY_CARE_PROVIDER_SITE_OTHER): Payer: Medicare Other | Admitting: Internal Medicine

## 2020-07-20 VITALS — BP 166/72 | HR 72 | Temp 98.1°F | Resp 16 | Ht 61.0 in | Wt 142.0 lb

## 2020-07-20 DIAGNOSIS — E119 Type 2 diabetes mellitus without complications: Secondary | ICD-10-CM

## 2020-07-20 DIAGNOSIS — R040 Epistaxis: Secondary | ICD-10-CM

## 2020-07-20 DIAGNOSIS — I10 Essential (primary) hypertension: Secondary | ICD-10-CM

## 2020-07-20 DIAGNOSIS — F419 Anxiety disorder, unspecified: Secondary | ICD-10-CM | POA: Diagnosis not present

## 2020-07-20 DIAGNOSIS — R252 Cramp and spasm: Secondary | ICD-10-CM | POA: Diagnosis not present

## 2020-07-20 MED ORDER — ALPRAZOLAM 0.25 MG PO TABS: 0.2500 mg | ORAL_TABLET | Freq: Every day | ORAL | 2 refills | Status: DC | PRN

## 2020-07-20 MED ORDER — METFORMIN HCL 1000 MG PO TABS: ORAL_TABLET | ORAL | 5 refills | Status: DC

## 2020-07-20 NOTE — Patient Instructions (Signed)
Check the  blood pressure regularly BP GOAL is between 110/65 and  135/85. If it is consistently higher or lower, let me know  Try tonic water, 1/2 cup before bedtime to help leg cramps   GO TO THE LAB : Get the blood work     GO TO THE FRONT DESK, [place] back for a physical exam in 4 months

## 2020-07-20 NOTE — Progress Notes (Signed)
Subjective:    Patient ID: Patricia Moore, female    DOB: 1933-10-28, 86 y.o.   MRN: [id number]  DOS:  07/20/2020 Type of visit - description: ROV Nosebleeds: Has occasionally nosebleed, few days ago had a severe episode,  from the left nostril, went to the ER. No further nosebleeds, the next day she did notice her stools to be black one time.  HTN: Well-controlled at home.  DM: Started Ozempic, feeling great, CBGs are nicely going down from the 140s to the low 100s.  Also complaining of on and off leg cramps, only at night, bilateral, at times severe.  Wt Readings from Last 3 Encounters:  07/20/20 142 lb (64.4 kg)  07/16/20 137 lb (62.1 kg)  06/29/20 142 lb 9.6 oz (64.7 kg)     Review of Systems Denies fresh blood per rectum, no vomiting, occasional nausea.   Past Medical History:  Diagnosis Date  . Anxiety 12/09/2013  . Arthritis   . Cataract   . Diabetes mellitus   . Diverticulitis    patient states has had three attacks   . Diverticulosis   . DJD (degenerative joint disease) 12/14/2014  . Glaucoma   . H. pylori infection 2001  . Heart murmur   . Hx of adenomatous colonic polyps   . Hyperlipidemia   . Hypertension   . Intraductal papilloma    bx nef x 2 in the 70s-90s  . Saccular aneurysm   . SCC (squamous cell carcinoma) 03/2011   sees dermatology    Past Surgical History:  Procedure Laterality Date  . BREAST BIOPSY  '91, '95   INTRADUCTAL PAPILLOMA  . BREAST LUMPECTOMY    . CATARACT EXTRACTION Bilateral 09/2014  . CLOSED REDUCTION ULNAR SHAFT Right 10/06/2017   Procedure: CLOSED REDUCTION DISLOCATED RIGHT SHOULDER;  Surgeon: Kathryne Hitch, MD;  Location: [facility] OR;  Service: Orthopedics;  Laterality: Right;  . COLONOSCOPY      Allergies as of 07/20/2020      Reactions   Ciprofloxacin Nausea And Vomiting      Medication List       Accurate as of July 20, 2020  7:19 PM. If you have any questions, ask your nurse or doctor.        ALPRAZolam  0.25 MG tablet Commonly known as: XANAX Take 1 tablet (0.25 mg total) by mouth daily as needed for anxiety. What changed: See the new instructions. Changed by: Willow Ora, MD   carvedilol 25 MG tablet Commonly known as: COREG Take 1 tablet (25 mg total) by mouth 2 (two) times daily with a meal.   cholecalciferol 1000 units tablet Commonly known as: VITAMIN D Take 1,000 Units by mouth daily.   co-enzyme Q-10 30 MG capsule Take 30 mg by mouth daily.   dorzolamide-timolol 22.3-6.8 MG/ML ophthalmic solution Commonly known as: COSOPT Place 1 drop into both eyes 2 (two) times daily.   Insulin Pen Needle 31G X 5 MM Misc To use w/ Victoza   metFORMIN 1000 MG tablet Commonly known as: GLUCOPHAGE TAKE 1 TABLET BY MOUTH WITH BREAKFAST, 1 TABLET WITH LUNCH AND 1/2 TABLET WITH DINNER   OneTouch Ultra test strip Generic drug: glucose blood CHECK BLOOD SUGAR NO MORE THAN 2 TIMES DAILY   onetouch ultrasoft lancets CHECK BLOOD SUGAR NO MORE THAN 2 TIMES PER DAY   Ozempic (0.25 or 0.5 MG/DOSE) 2 MG/1.5ML Sopn Generic drug: Semaglutide(0.25 or 0.5MG /DOS) Inject 0.5 mg into the skin once a week. Inject 0.25mg  into skin once  weekly for 4 weeks, then increase to 0.50mg  thereafter What changed:   how much to take  how to take this  when to take this Changed by: Willow Ora, MD   Restasis 0.05 % ophthalmic emulsion Generic drug: cycloSPORINE   vitamin B-12 250 MCG tablet Commonly known as: CYANOCOBALAMIN Take 250 mcg by mouth daily.          Objective:   Physical Exam BP (!) 166/72 (BP Location: Left Arm, Patient Position: Sitting, Cuff Size: Small)   Pulse 72   Temp 98.1 F (36.7 C) (Oral)   Resp 16   Ht 5\' 1"  (1.549 m)   Wt 142 lb (64.4 kg)   LMP 04/17/1988   SpO2 98%   BMI 26.83 kg/m  General:   Well developed, NAD, BMI noted. HEENT:  Normocephalic . Face symmetric, atraumatic. Right nostril normal Left nostril: Nasal septum slightly irritated, has very small scar  there. Lungs:  CTA B Normal respiratory effort, no intercostal retractions, no accessory muscle use. Heart: RRR,  no murmur.  Lower extremities: no pretibial edema bilaterally  Skin: Not pale. Not jaundice Neurologic:  alert & oriented X3.  Speech normal, gait appropriate for age and unassisted Psych--  Cognition and judgment appear intact.  Cooperative with normal attention span and concentration.  Behavior appropriate. No anxious or depressed appearing.      Assessment     Assessment  DM HTN -Losartan d/c d/t hyperkalemia -Amlodipine d/c d/t edema 07-2017 - HCTZ d/c d/t hyperkalemia 08/2017 Hyperlipidemia -- intolerant to lipitor and crestor (severe cramps).  Declined further trial with medications  on 01/16/2018   Anxiety- xanax rx by pcp Vertigo - valium prn GI: --Recurrent diverticulitis (one episode documented by a CT 11-2014) --H. pylori 2001 DJD--back pain, knees, neck pain ( on baclofen for neck) Osteoporosis : Tscore -1.9 on 11-2015 , T score  -1.8 (03/2019); h/o  R ulnar FX 2019 SCC, skin cancer Elevated homocysteine Raynaud's phenomena  NEURO --Carotid US 10-2013: No significant plaque, although ICA's are serpentine, bilaterally. 40-59% bilateral ICA stenosis by velocity criteria likely due to tortuosity. Patent vertebral arteries with antegrade flow.Normal subclavian arteries, bilaterally. -- Transinet dizziness, diplopia sx late 2016 , saw neuro >>> Dx w/ TIA/Stroke on clinical grounds  W/u:  Brain  MRI 03-2015 >> 2 mm saccular aneurysm at the right posterior communicating artery origin, saw  Neurology, Rx to recheck MRI 1 year, ok continue ASA per neuro CTA head, neck 05-14-2015: aneurysm confirmed, otherwise (-) --brain aneurysm see above, saw neuro 2018.  MRA head 08/2019 stable, recheck 5 years per neuro --Admitted 07-2015, dizziness, MRI negative, DX labyrinthitis   PLAN: DM: See last visit, started Ozempic, she also takes Metformin, ambulatory CBGs reportedly  great in the low 100s, check A1c. HTN: BP today is elevated, at home they are consistently normal, check a BMP, CBC, continue carvedilol,. Cardiology: [facility]ology 06/29/2020, was recommended Cardizem for palpitations, declined. Was recommended an echo next year for follow-up of MR. Nosebleed:  left-sided, went to the ER, no further symptoms, to see ENT soon. Had black stool x1 after the incident, she thinks is from the blood she swallowed, I agree, that is likely.  We are checking a CBC. History of TIA/stroke: To see neurology tomorrow. Leg cramps: As described above, we agreed on a trial with tonic water as needed. Anxiety: PDMP okay, Xanax RF RTC CPX 4 months    This visit occurred during the SARS-CoV-2 public health emergency.  Safety protocols were in  place, including screening questions prior to the visit, additional usage of staff PPE, and extensive cleaning of exam room while observing appropriate contact time as indicated for disinfecting solutions.

## 2020-07-20 NOTE — Assessment & Plan Note (Signed)
DM: See last visit, started Ozempic, she also takes Metformin, ambulatory CBGs reportedly great in the low 100s, check A1c. HTN: BP today is elevated, at home they are consistently normal, check a BMP, CBC, continue carvedilol,. Cardiology: [facility] cardiology 06/29/2020, was recommended Cardizem for palpitations, declined. Was recommended an echo next year for follow-up of MR. Nosebleed:  left-sided, went to the ER, no further symptoms, to see ENT soon. Had black stool x1 after the incident, she thinks is from the blood she swallowed, I agree, that is likely.  We are checking a CBC. History of TIA/stroke: To see neurology tomorrow. Leg cramps: As described above, we agreed on a trial with tonic water as needed. Anxiety: PDMP okay, Xanax RF RTC CPX 4 months

## 2020-07-21 ENCOUNTER — Encounter: Payer: Self-pay | Admitting: Neurology

## 2020-07-21 ENCOUNTER — Ambulatory Visit: Payer: Medicare Other | Admitting: Neurology

## 2020-07-21 VITALS — BP 136/62 | HR 77 | Ht 61.0 in | Wt 141.0 lb

## 2020-07-21 DIAGNOSIS — M4722 Other spondylosis with radiculopathy, cervical region: Secondary | ICD-10-CM | POA: Diagnosis not present

## 2020-07-21 DIAGNOSIS — E1142 Type 2 diabetes mellitus with diabetic polyneuropathy: Secondary | ICD-10-CM

## 2020-07-21 DIAGNOSIS — R040 Epistaxis: Secondary | ICD-10-CM | POA: Diagnosis not present

## 2020-07-21 LAB — CBC WITH DIFFERENTIAL/PLATELET
Basophils Absolute: 0.1 10*3/uL (ref 0.0–0.1)
Basophils Relative: 0.9 % (ref 0.0–3.0)
Eosinophils Absolute: 1.9 10*3/uL — ABNORMAL HIGH (ref 0.0–0.7)
Eosinophils Relative: 14.6 % — ABNORMAL HIGH (ref 0.0–5.0)
HCT: 36.5 % (ref 36.0–46.0)
Hemoglobin: 12.3 g/dL (ref 12.0–15.0)
Lymphocytes Relative: 27.9 % (ref 12.0–46.0)
Lymphs Abs: 3.6 10*3/uL (ref 0.7–4.0)
MCHC: 33.7 g/dL (ref 30.0–36.0)
MCV: 92.1 fl (ref 78.0–100.0)
Monocytes Absolute: 0.8 10*3/uL (ref 0.1–1.0)
Monocytes Relative: 6.5 % (ref 3.0–12.0)
Neutro Abs: 6.5 10*3/uL (ref 1.4–7.7)
Neutrophils Relative %: 50.1 % (ref 43.0–77.0)
Platelets: 358 10*3/uL (ref 150.0–400.0)
RBC: 3.96 Mil/uL (ref 3.87–5.11)
RDW: 12.9 % (ref 11.5–15.5)
WBC: 13 10*3/uL — ABNORMAL HIGH (ref 4.0–10.5)

## 2020-07-21 LAB — BASIC METABOLIC PANEL
BUN: 16 mg/dL (ref 6–23)
CO2: 30 mEq/L (ref 19–32)
Calcium: 10.6 mg/dL — ABNORMAL HIGH (ref 8.4–10.5)
Chloride: 98 mEq/L (ref 96–112)
Creatinine, Ser: 1.16 mg/dL (ref 0.40–1.20)
GFR: 43.29 mL/min — ABNORMAL LOW (ref >60.00)
Glucose, Bld: 81 mg/dL (ref 70–99)
Potassium: 4.8 mEq/L (ref 3.5–5.1)
Sodium: 136 mEq/L (ref 135–145)

## 2020-07-21 LAB — HEMOGLOBIN A1C: Hgb A1c MFr Bld: 6.3 % (ref 4.6–6.5)

## 2020-07-21 MED ORDER — GABAPENTIN 100 MG PO CAPS: 100.0000 mg | ORAL_CAPSULE | Freq: Every day | ORAL | 5 refills | Status: DC

## 2020-07-21 NOTE — Patient Instructions (Signed)
1.  Start gabapentin 100mg  at bedtime for neck pain 2.  Follow up with ENT/Dr. Lucia Gaskins regarding nose bleed

## 2020-07-23 ENCOUNTER — Other Ambulatory Visit: Payer: Self-pay

## 2020-07-23 DIAGNOSIS — D72829 Elevated white blood cell count, unspecified: Secondary | ICD-10-CM

## 2020-08-03 ENCOUNTER — Ambulatory Visit (INDEPENDENT_AMBULATORY_CARE_PROVIDER_SITE_OTHER): Payer: Medicare Other | Admitting: Otolaryngology

## 2020-08-03 ENCOUNTER — Other Ambulatory Visit: Payer: Self-pay

## 2020-08-03 ENCOUNTER — Encounter (INDEPENDENT_AMBULATORY_CARE_PROVIDER_SITE_OTHER): Payer: Self-pay | Admitting: Otolaryngology

## 2020-08-03 VITALS — Temp 97.3°F

## 2020-08-03 DIAGNOSIS — R040 Epistaxis: Secondary | ICD-10-CM | POA: Diagnosis not present

## 2020-08-03 NOTE — Progress Notes (Signed)
HPI: FLORNCE Moore is a 87 y.o. female who returns today for evaluation of left-sided epistaxis.  She initially had a left-sided nosebleed about 3 months ago.  She had bleeding several times and last time he had lab was 2 weeks ago.  It has always bled from the left side..  She does not have any trouble breathing through the nose. She is on no blood thinners.  Past Medical History:  Diagnosis Date  . Anxiety 12/09/2013  . Arthritis   . Cataract   . Diabetes mellitus   . Diverticulitis    patient states has had three attacks   . Diverticulosis   . DJD (degenerative joint disease) 12/14/2014  . Glaucoma   . H. pylori infection 2001  . Heart murmur   . Hx of adenomatous colonic polyps   . Hyperlipidemia   . Hypertension   . Intraductal papilloma    bx nef x 2 in the 70s-90s  . Saccular aneurysm   . SCC (squamous cell carcinoma) 03/2011   sees dermatology   Past Surgical History:  Procedure Laterality Date  . BREAST BIOPSY  '91, '95   INTRADUCTAL PAPILLOMA  . BREAST LUMPECTOMY    . CATARACT EXTRACTION Bilateral 09/2014  . CLOSED REDUCTION ULNAR SHAFT Right 10/06/2017   Procedure: CLOSED REDUCTION DISLOCATED RIGHT SHOULDER;  Surgeon: Mcarthur Rossetti, MD;  Location: [facility];  Service: Orthopedics;  Laterality: Right;  . COLONOSCOPY     Social History   Socioeconomic History  . Marital status: Married    Spouse name: Not on file  . Number of children: 1  . Years of education: Not on file  . Highest education level: Not on file  Occupational History  . 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 Smoking status: Never Smoker  . Smokeless tobacco: Never Used  Vaping Use  . Vaping Use: Never used  Substance and Sexual Activity  . Alcohol use: Never  . Drug use: No  . Sexual activity: Not Currently  Other Topics Concern  . 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 Difficulty of Paying Living Expenses: Not hard at all  Food Insecurity: No Food Insecurity  . Worried About Charity fundraiser in the Last Year: Never true  . Ran Out of Food in the Last Year: Never true  Transportation Needs: No Transportation Needs  . Lack of Transportation (Medical): No  . Lack of Transportation (Non-Medical): No  Physical Activity: Not on file  Stress: Not on file  Social Connections: Not on file   Family History  Adopted: Yes  Problem Relation Age of Onset  . Heart disease Mother 7  . Heart disease Father 89  . Breast cancer Maternal Aunt   . Stroke Maternal Aunt   . Colon cancer Paternal Uncle        7 uncles  . Colon cancer Maternal Grandfather        COLON,, FAMILY HX 7 MEMBERS  . Breast cancer Maternal Aunt   . Cancer Sister        skin  . Hypertension Neg Hx   . Diabetes Neg Hx   . Esophageal cancer Neg Hx   .  Rectal cancer Neg Hx   . Stomach cancer Neg Hx    Allergies  Allergen Reactions  . Ciprofloxacin Nausea And Vomiting   Prior to Admission medications   Medication Sig Start Date End Date Taking? Authorizing Provider  ALPRAZolam (XANAX) 0.25 MG tablet Take 1 tablet (0.25 mg total) by mouth daily as needed for anxiety. 07/20/20   Colon Branch, MD  carvedilol (COREG) 25 MG tablet Take 1 tablet (25 mg total) by mouth 2 (two) times daily with a meal. 03/29/20   Colon Branch, MD  cholecalciferol (VITAMIN D) 1000 UNITS tablet Take 1,000 Units by mouth daily.    [provider]  co-enzyme Q-10 30 MG capsule Take 30 mg by mouth daily.    [provider]  dorzolamide-timolol (COSOPT) 22.3-6.8 MG/ML ophthalmic solution Place 1 drop into both eyes 2 (two) times daily.  04/03/16   [provider]  gabapentin (NEURONTIN) 100 MG capsule Take 1 capsule (100 mg total) by mouth at bedtime. 07/21/20   Tomi Likens, Adam R, DO   glyBURIDE (DIABETA) 2.5 MG tablet Take 2.5 mg by mouth daily. 06/27/20   [provider]  Insulin Pen Needle 31G X 5 MM MISC To use w/ Victoza 04/23/20   Colon Branch, MD  Lancets [facility] ULTRASOFT) lancets CHECK BLOOD SUGAR NO MORE THAN 2 TIMES PER DAY 04/29/20   Colon Branch, MD  metFORMIN (GLUCOPHAGE) 1000 MG tablet TAKE 1 TABLET BY MOUTH WITH BREAKFAST, 1 TABLET WITH LUNCH AND 1/2 TABLET WITH DINNER 07/20/20   Colon Branch, MD  [facility] ULTRA test strip CHECK BLOOD SUGAR NO MORE THAN 2 TIMES DAILY 04/28/20   Colon Branch, MD  RESTASIS 0.05 % ophthalmic emulsion  11/15/18   [provider]  Semaglutide,0.25 or 0.5MG /DOS, (OZEMPIC, 0.25 OR 0.5 MG/DOSE,) 2 MG/1.5ML SOPN Inject 0.5 mg into the skin once a week. Inject 0.25mg  into skin once weekly for 4 weeks, then increase to 0.50mg  thereafter 07/20/20   Colon Branch, MD  vitamin B-12 (CYANOCOBALAMIN) 250 MCG tablet Take 250 mcg by mouth daily.    [provider]     Positive ROS: Otherwise negative  All other systems have been reviewed and were otherwise negative with the exception of those mentioned in the HPI and as above.  Physical Exam: Constitutional: Alert, well-appearing, no acute distress Ears: External ears without lesions or tenderness. Ear canals are clear bilaterally with intact, clear TMs.  Nasal: External nose without lesions. Septum slightly deviated to the left.  Right nasal cavity is clear..  On exam of the left side she has a small vessel and nodule on the anterior inferior portion of the septum.  This is the etiology of the nosebleed as remaining nasal cavity is clear.  This was cauterized using silver nitrate with minimal bleeding. Oral: Lips and gums without lesions. Tongue and palate mucosa without lesions. Posterior oropharynx clear. Neck: No palpable adenopathy or masses Respiratory: Breathing comfortably  Skin: No facial/neck lesions or rash noted.  Control of epistaxis  Date/Time: 08/03/2020 5:32  PM Performed by: Rozetta Nunnery, MD Authorized by: Rozetta Nunnery, MD   Consent:    Consent obtained:  Verbal   Consent given by:  Patient   Risks discussed:  Bleeding and pain   Alternatives discussed:  No treatment and observation Anesthesia:    Anesthesia method:  None Procedure details:    Treatment site:  L anterior   Treatment method:  Silver nitrate   Treatment  complexity:  Limited   Treatment episode: initial   Post-procedure details:    Patient tolerance of procedure:  Tolerated well, no immediate complications Comments:     Patient had a prominent vessel anterior inferior on the left side of the septum that was cauterized using silver nitrate.    Assessment: Left-sided epistaxis  Plan: This was cauterized using silver nitrate in the office today. Reviewed with her concerning using a cotton ball in Afrin or cold water on the cottonball if she has any further nosebleeds. She will notify us if she has any further bleeding.   Radene Journey, MD

## 2020-08-18 ENCOUNTER — Ambulatory Visit (INDEPENDENT_AMBULATORY_CARE_PROVIDER_SITE_OTHER): Payer: Medicare Other | Admitting: Internal Medicine

## 2020-08-18 ENCOUNTER — Other Ambulatory Visit: Payer: Self-pay

## 2020-08-18 ENCOUNTER — Encounter: Payer: Self-pay | Admitting: Internal Medicine

## 2020-08-18 VITALS — BP 158/70 | HR 70 | Temp 98.4°F | Resp 16 | Ht 61.0 in | Wt 134.4 lb

## 2020-08-18 DIAGNOSIS — D72829 Elevated white blood cell count, unspecified: Secondary | ICD-10-CM | POA: Diagnosis not present

## 2020-08-18 DIAGNOSIS — E119 Type 2 diabetes mellitus without complications: Secondary | ICD-10-CM

## 2020-08-18 DIAGNOSIS — Z7185 Encounter for immunization safety counseling: Secondary | ICD-10-CM | POA: Diagnosis not present

## 2020-08-18 DIAGNOSIS — R634 Abnormal weight loss: Secondary | ICD-10-CM

## 2020-08-18 NOTE — Progress Notes (Signed)
Subjective:    Patient ID: Patricia Moore, female    DOB: 08/01/1933, 87 y.o.   MRN: [id number]  DOS:  08/18/2020 Type of visit - description: Acute  Patient was at the office to get blood work however she has some questions. She is somewhat concerned about the recent elevated WBC.  She denies fever chills. Has noted some weight loss mostly because she is watching her diet closely, also she started Ozempic about a month ago. Has some nausea with Ozempic but that is getting better No diarrhea No cough  No further nosebleeds.   Wt Readings from Last 3 Encounters:  08/18/20 134 lb 6 oz (61 kg)  07/21/20 141 lb (64 kg)  07/20/20 142 lb (64.4 kg)     Review of Systems See above   Past Medical History:  Diagnosis Date  . Anxiety 12/09/2013  . Arthritis   . Cataract   . Diabetes mellitus   . Diverticulitis    patient states has had three attacks   . Diverticulosis   . DJD (degenerative joint disease) 12/14/2014  . Glaucoma   . H. pylori infection 2001  . Heart murmur   . Hx of adenomatous colonic polyps   . Hyperlipidemia   . Hypertension   . Intraductal papilloma    bx nef x 2 in the 70s-90s  . Saccular aneurysm   . SCC (squamous cell carcinoma) 03/2011   sees dermatology    Past Surgical History:  Procedure Laterality Date  . BREAST BIOPSY  '91, '95   INTRADUCTAL PAPILLOMA  . BREAST LUMPECTOMY    . CATARACT EXTRACTION Bilateral 09/2014  . CLOSED REDUCTION ULNAR SHAFT Right 10/06/2017   Procedure: CLOSED REDUCTION DISLOCATED RIGHT SHOULDER;  Surgeon: Kathryne Hitch, MD;  [facility] OR;  Service: Orthopedics;  Laterality: Right;  . COLONOSCOPY      Allergies as of 08/18/2020      Reactions   Ciprofloxacin Nausea And Vomiting      Medication List       Accurate as of Aug 18, 2020  3:15 PM. If you have any questions, ask your nurse or doctor.        ALPRAZolam 0.25 MG tablet Commonly known as: XANAX Take 1 tablet (0.25 mg total) by mouth daily  as needed for anxiety.   carvedilol 25 MG tablet Commonly known as: COREG Take 1 tablet (25 mg total) by mouth 2 (two) times daily with a meal.   cholecalciferol 1000 units tablet Commonly known as: VITAMIN D Take 1,000 Units by mouth daily.   co-enzyme Q-10 30 MG capsule Take 30 mg by mouth daily.   dorzolamide-timolol 22.3-6.8 MG/ML ophthalmic solution Commonly known as: COSOPT Place 1 drop into both eyes 2 (two) times daily.   gabapentin 100 MG capsule Commonly known as: Neurontin Take 1 capsule (100 mg total) by mouth at bedtime.   glyBURIDE 2.5 MG tablet Commonly known as: DIABETA Take 2.5 mg by mouth daily.   Insulin Pen Needle 31G X 5 MM Misc To use w/ Victoza   metFORMIN 1000 MG tablet Commonly known as: GLUCOPHAGE TAKE 1 TABLET BY MOUTH WITH BREAKFAST, 1 TABLET WITH LUNCH AND 1/2 TABLET WITH DINNER   OneTouch Ultra test strip Generic drug: glucose blood CHECK BLOOD SUGAR NO MORE THAN 2 TIMES DAILY   onetouch ultrasoft lancets CHECK BLOOD SUGAR NO MORE THAN 2 TIMES PER DAY   Ozempic (0.25 or 0.5 MG/DOSE) 2 MG/1.5ML Sopn Generic drug: Semaglutide(0.25 or 0.5MG /DOS) Inject 0.5  mg into the skin once a week. Inject 0.25mg  into skin once weekly for 4 weeks, then increase to 0.50mg  thereafter   Restasis 0.05 % ophthalmic emulsion Generic drug: cycloSPORINE   vitamin B-12 250 MCG tablet Commonly known as: CYANOCOBALAMIN Take 250 mcg by mouth daily.          Objective:   Physical Exam BP (!) 158/70 (BP Location: Left Arm, Patient Position: Sitting, Cuff Size: Small)   Pulse 70   Temp 98.4 F (36.9 C) (Oral)   Resp 16   Ht 5\' 1"  (1.549 m)   Wt 134 lb 6 oz (61 kg)   LMP 04/17/1988   SpO2 98%   BMI 25.39 kg/m  General:   Well developed, NAD, BMI noted. HEENT:  Normocephalic . Face symmetric, atraumatic Skin: Not pale. Not jaundice Neurologic:  alert & oriented X3.  Speech normal, gait appropriate for age and unassisted Psych--  Cognition and  judgment appear intact.  Cooperative with normal attention span and concentration.  Behavior appropriate. No anxious or depressed appearing.      Assessment     Assessment  DM HTN -Losartan d/c d/t hyperkalemia -Amlodipine d/c d/t edema 07-2017 - HCTZ d/c d/t hyperkalemia 08/2017 Hyperlipidemia -- intolerant to lipitor and crestor (severe cramps).  Declined further trial with medications  on 01/16/2018   Anxiety- xanax rx by pcp Vertigo - valium prn GI: --Recurrent diverticulitis (one episode documented by a CT 11-2014) --H. pylori 2001 DJD--back pain, knees, neck pain ( on baclofen for neck) Osteoporosis : Tscore -1.9 on 11-2015 , T score  -1.8 (03/2019); h/o  R ulnar FX 2019 SCC, skin cancer Elevated homocysteine Raynaud's phenomena  NEURO --Carotid US 10-2013: No significant plaque, although ICA's are serpentine, bilaterally. 40-59% bilateral ICA stenosis by velocity criteria likely due to tortuosity. Patent vertebral arteries with antegrade flow.Normal subclavian arteries, bilaterally. -- Transinet dizziness, diplopia sx late 2016 , saw neuro >>> Dx w/ TIA/Stroke on clinical grounds  W/u:  Brain  MRI 03-2015 >> 2 mm saccular aneurysm at the right posterior communicating artery origin, saw  Neurology, Rx to recheck MRI 1 year, ok continue ASA per neuro CTA head, neck 05-14-2015: aneurysm confirmed, otherwise (-) --brain aneurysm see above, saw neuro 2018.  MRA head 08/2019 stable, recheck 5 years per neuro --Admitted 07-2015, dizziness, MRI negative, DX labyrinthitis   PLAN: Leukocytosis: See last CBC, WBC is elevated, also some eosinophilia but no other cells affected , no systemic symptoms.  Recheck today.  Doubt serious etiology DM: A month ago started Ozempic, had some nausea initially, that is decreasing. + Weight loss: Likely due to healthier diet and the addition of Ozempic. Vaccine counseling: She is reluctant to proceed with a COVID booster if WBCs remain elevated, we agreed  to wait for results. RTC already scheduled for August 2022     This visit occurred during the SARS-CoV-2 public health emergency.  Safety protocols were in place, including screening questions prior to the visit, additional usage of staff PPE, and extensive cleaning of exam room while observing appropriate contact time as indicated for disinfecting solutions.

## 2020-08-18 NOTE — Patient Instructions (Signed)
Please get your blood work done  Your next visit is in August for a routine checkup.

## 2020-08-19 ENCOUNTER — Other Ambulatory Visit: Payer: Self-pay | Admitting: Internal Medicine

## 2020-08-19 LAB — CBC WITH DIFFERENTIAL/PLATELET
Basophils Absolute: 0.1 10*3/uL (ref 0.0–0.1)
Basophils Relative: 0.8 % (ref 0.0–3.0)
Eosinophils Absolute: 0.7 10*3/uL (ref 0.0–0.7)
Eosinophils Relative: 7.7 % — ABNORMAL HIGH (ref 0.0–5.0)
HCT: 36.8 % (ref 36.0–46.0)
Hemoglobin: 12.5 g/dL (ref 12.0–15.0)
Lymphocytes Relative: 32.5 % (ref 12.0–46.0)
Lymphs Abs: 2.9 10*3/uL (ref 0.7–4.0)
MCHC: 34.1 g/dL (ref 30.0–36.0)
MCV: 91.8 fl (ref 78.0–100.0)
Monocytes Absolute: 0.9 10*3/uL (ref 0.1–1.0)
Monocytes Relative: 9.7 % (ref 3.0–12.0)
Neutro Abs: 4.4 10*3/uL (ref 1.4–7.7)
Neutrophils Relative %: 49.3 % (ref 43.0–77.0)
Platelets: 378 10*3/uL (ref 150.0–400.0)
RBC: 4.01 Mil/uL (ref 3.87–5.11)
RDW: 12.7 % (ref 11.5–15.5)
WBC: 8.9 10*3/uL (ref 4.0–10.5)

## 2020-08-19 NOTE — Assessment & Plan Note (Signed)
Leukocytosis: See last CBC, WBC is elevated, also some eosinophilia but no other cells affected , no systemic symptoms.  Recheck today.  Doubt serious etiology DM: A month ago started Ozempic, had some nausea initially, that is decreasing. + Weight loss: Likely due to healthier diet and the addition of Ozempic. Vaccine counseling: She is reluctant to proceed with a COVID booster if WBCs remain elevated, we agreed to wait for results. RTC already scheduled for August 2022

## 2020-08-31 ENCOUNTER — Telehealth: Payer: Self-pay | Admitting: Internal Medicine

## 2020-08-31 NOTE — Telephone Encounter (Signed)
Error

## 2020-09-01 ENCOUNTER — Encounter: Payer: Self-pay | Admitting: Family

## 2020-09-01 ENCOUNTER — Other Ambulatory Visit: Payer: Self-pay

## 2020-09-01 ENCOUNTER — Ambulatory Visit (INDEPENDENT_AMBULATORY_CARE_PROVIDER_SITE_OTHER): Payer: Medicare Other | Admitting: Family

## 2020-09-01 DIAGNOSIS — I10 Essential (primary) hypertension: Secondary | ICD-10-CM

## 2020-09-01 DIAGNOSIS — E119 Type 2 diabetes mellitus without complications: Secondary | ICD-10-CM

## 2020-09-01 NOTE — Assessment & Plan Note (Signed)
Pt reports improvement in her sugars since she started Ozempic. Pt assistance form was filled and given to the patient today for Ozempic. She will submit with her tax forms.

## 2020-09-01 NOTE — Assessment & Plan Note (Signed)
Initial BP was mildly elevated. Follow up bp was better 148/65.  Monitor.

## 2020-09-01 NOTE — Patient Instructions (Signed)
Please follow up as scheduled with Dr. Larose Kells.

## 2020-09-01 NOTE — Progress Notes (Signed)
Subjective:   By signing my name below, I, Patricia Moore, attest that this documentation has been prepared under the direction and in the presence of Patricia Moore. 09/01/2020     Patient ID: Patricia Moore, female    DOB: 1933/07/11, 87 y.o.   MRN: [id number]  No chief complaint on file.   HPI Patient is in today for a office visit. She is doing well at this time.  Diabetes- She is taking ozempic to manage her diabetes well at this time. She has reduced her blood sugar since taking ozempic. States that her monthly cost for Ozempic has risen from $45 to $200 a month and she can no longer afford the Ozempic. She would like for [place] to complete patient assistance form for her to submit for Ozempic.    Past Medical History:  Diagnosis Date  . Anxiety 12/09/2013  . Arthritis   . Cataract   . Diabetes mellitus   . Diverticulitis    patient states has had three attacks   . Diverticulosis   . DJD (degenerative joint disease) 12/14/2014  . Glaucoma   . H. pylori infection 2001  . Heart murmur   . Hx of adenomatous colonic polyps   . Hyperlipidemia   . Hypertension   . Intraductal papilloma    bx nef x 2 in the 70s-90s  . Saccular aneurysm   . SCC (squamous cell carcinoma) 03/2011   sees dermatology    Past Surgical History:  Procedure Laterality Date  . BREAST BIOPSY  '91, '95   INTRADUCTAL PAPILLOMA  . BREAST LUMPECTOMY    . CATARACT EXTRACTION Bilateral 09/2014  . CLOSED REDUCTION ULNAR SHAFT Right 10/06/2017   Procedure: CLOSED REDUCTION DISLOCATED RIGHT SHOULDER;  Surgeon: Kathryne Hitch, MD;  Location: [facility] OR;  Service: Orthopedics;  Laterality: Right;  . COLONOSCOPY      Family History  Adopted: Yes  Problem Relation Age of Onset  . Heart disease Mother 66  . Heart disease Father 66  . Breast cancer Maternal Aunt   . Stroke Maternal Aunt   . Colon cancer Paternal Uncle        7 uncles  . Colon cancer Maternal Grandfather        COLON,, FAMILY HX 7  MEMBERS  . Breast cancer Maternal Aunt   . Cancer Sister        skin  . Hypertension Neg Hx   . Diabetes Neg Hx   . Esophageal cancer Neg Hx   . Rectal cancer Neg Hx   . Stomach cancer Neg Hx     Social History   Socioeconomic History  . Marital status: Married    Spouse name: Not on file  . Number of children: 1  . Years of education: Not on file  . Highest education level: Not on file  Occupational History  . 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 Smoking status: Never Smoker  . Smokeless tobacco: Never Used  Vaping Use  . Vaping Use: Never used  Substance and Sexual Activity  . Alcohol use: Never  . Drug use: No  . Sexual activity: Not Currently  Other Topics Concern  . Not on file  Social History Narrative   Born in [place], ,moved to the [place] in the 60s, live in [place] then in [place]   Lives w/ husband   H.S. Buyer, retail   Son lives in [place])    Social Determinants of Health  Financial Resource Strain: Low Risk   . Difficulty of Paying Living Expenses: Not hard at all  Food Insecurity: No Food Insecurity  . Worried About Programme researcher, broadcasting/film/video in the Last Year: Never true  . Ran Out of Food in the Last Year: Never true  Transportation Needs: No Transportation Needs  . Lack of Transportation (Medical): No  . Lack of Transportation (Non-Medical): No  Physical Activity: Not on file  Stress: Not on file  Social Connections: Not on file  Intimate Partner Violence: Not on file    Outpatient Medications Prior to Visit  Medication Sig Dispense Refill  . ALPRAZolam (XANAX) 0.25 MG tablet Take 1 tablet (0.25 mg total) by mouth daily as needed for anxiety. 30 tablet 2  . carvedilol (COREG) 25 MG tablet Take 1 tablet (25 mg total) by mouth 2 (two) times daily with a meal. 180 tablet 1  . cholecalciferol (VITAMIN D) 1000 UNITS tablet Take 1,000 Units by mouth daily.    Marland Kitchen co-enzyme Q-10 30 MG capsule Take 30 mg by mouth daily.    . dorzolamide-timolol  (COSOPT) 22.3-6.8 MG/ML ophthalmic solution Place 1 drop into both eyes 2 (two) times daily.     Marland Kitchen gabapentin (NEURONTIN) 100 MG capsule Take 1 capsule (100 mg total) by mouth at bedtime. 30 capsule 5  . glyBURIDE (DIABETA) 2.5 MG tablet Take 2.5 mg by mouth daily.    . Insulin Pen Needle 31G X 5 MM MISC To use w/ Victoza 100 each 2  . Lancets (ONETOUCH ULTRASOFT) lancets CHECK BLOOD SUGAR NO MORE THAN 2 TIMES PER DAY 200 each 12  . metFORMIN (GLUCOPHAGE) 1000 MG tablet TAKE 1 TABLET BY MOUTH WITH BREAKFAST, 1 TABLET WITH LUNCH AND 1/2 TABLET WITH DINNER 75 tablet 5  . ONETOUCH ULTRA test strip CHECK BLOOD SUGAR NO MORE THAN 2 TIMES DAILY 200 strip 12  . RESTASIS 0.05 % ophthalmic emulsion     . Semaglutide,0.25 or 0.5MG /DOS, (OZEMPIC, 0.25 OR 0.5 MG/DOSE,) 2 MG/1.5ML SOPN Inject 0.5 mg into the skin once a week. 1.5 mL 3  . vitamin B-12 (CYANOCOBALAMIN) 250 MCG tablet Take 250 mcg by mouth daily.     No facility-administered medications prior to visit.    Allergies  Allergen Reactions  . Ciprofloxacin Nausea And Vomiting    ROS     Objective:    Physical Exam Constitutional:      Appearance: Normal appearance.  HENT:     Head: Normocephalic and atraumatic.     Right Ear: External ear normal.     Left Ear: External ear normal.  Eyes:     Extraocular Movements: Extraocular movements intact.     Pupils: Pupils are equal, round, and reactive to light.  Pulmonary:     Effort: Pulmonary effort is normal.  Skin:    General: Skin is warm and dry.  Neurological:     Mental Status: She is alert and oriented to person, place, and time.  Psychiatric:        Behavior: Behavior normal.     LMP 04/17/1988  Wt Readings from Last 3 Encounters:  08/18/20 134 lb 6 oz (61 kg)  07/21/20 141 lb (64 kg)  07/20/20 142 lb (64.4 kg)    Diabetic Foot Exam - Simple   No data filed    Lab Results  Component Value Date   WBC 8.9 08/18/2020   HGB 12.5 08/18/2020   HCT 36.8 08/18/2020    PLT 378.0 08/18/2020   GLUCOSE  81 07/20/2020   CHOL 198 06/12/2019   TRIG 108.0 06/12/2019   HDL 63.10 06/12/2019   LDLDIRECT 131.9 10/28/2010   LDLCALC 113 (H) 06/12/2019   ALT 20 04/23/2020   AST 26 04/23/2020   NA 136 07/20/2020   K 4.8 07/20/2020   CL 98 07/20/2020   CREATININE 1.16 07/20/2020   BUN 16 07/20/2020   CO2 30 07/20/2020   TSH 0.84 07/28/2019   INR 1.07 10/06/2017   HGBA1C 6.3 07/20/2020   MICROALBUR 1.0 01/21/2020    Lab Results  Component Value Date   TSH 0.84 07/28/2019   Lab Results  Component Value Date   WBC 8.9 08/18/2020   HGB 12.5 08/18/2020   HCT 36.8 08/18/2020   MCV 91.8 08/18/2020   PLT 378.0 08/18/2020   Lab Results  Component Value Date   NA 136 07/20/2020   K 4.8 07/20/2020   CO2 30 07/20/2020   GLUCOSE 81 07/20/2020   BUN 16 07/20/2020   CREATININE 1.16 07/20/2020   BILITOT 0.4 04/23/2020   ALKPHOS 75 10/15/2019   AST 26 04/23/2020   ALT 20 04/23/2020   PROT 8.2 (H) 04/23/2020   ALBUMIN 4.4 10/15/2019   CALCIUM 10.6 (H) 07/20/2020   ANIONGAP 9 12/03/2017   GFR 43.29 (L) 07/20/2020   Lab Results  Component Value Date   CHOL 198 06/12/2019   Lab Results  Component Value Date   HDL 63.10 06/12/2019   Lab Results  Component Value Date   LDLCALC 113 (H) 06/12/2019   Lab Results  Component Value Date   TRIG 108.0 06/12/2019   Lab Results  Component Value Date   CHOLHDL 3 06/12/2019   Lab Results  Component Value Date   HGBA1C 6.3 07/20/2020       Assessment & Plan:   Problem List Items Addressed This Visit   None    21 minutes spent on todays visit counseling pt about blood pressure and completing requested paperwork.   No orders of the defined types were placed in this encounter.   I, Patricia Moore, personally preformed the services described in this documentation.  All medical record entries made by the scribe were at my direction and in my presence.  I have reviewed the chart and discharge  instructions (if applicable) and agree that the record reflects my personal performance and is accurate and complete. 09/01/2020   I,Patricia Moore,acting as a Neurosurgeon for Lemont Fillers, Moore.,have documented all relevant documentation on the behalf of Lemont Fillers, Moore,as directed by  Lemont Fillers, Moore while in the presence of Lemont Fillers, Moore.   Patricia [employer]

## 2020-09-02 ENCOUNTER — Ambulatory Visit: Payer: Medicare Other | Admitting: Medical

## 2020-09-15 ENCOUNTER — Telehealth: Payer: Self-pay

## 2020-09-15 NOTE — Telephone Encounter (Signed)
Pt approved for [employer] for [employer] until March 16, 2021.   Ozempic (4 month supply) will be shipped to the office.   To reorder medication- must call [phone].   Approval letter sent for scanning.

## 2020-09-30 ENCOUNTER — Other Ambulatory Visit: Payer: Self-pay | Admitting: Internal Medicine

## 2020-10-06 ENCOUNTER — Telehealth: Payer: Self-pay

## 2020-10-06 NOTE — Telephone Encounter (Signed)
[employer]- he will bring by samples.

## 2020-10-06 NOTE — Telephone Encounter (Signed)
1 sample pen given to Pt. Will call Friday to check status of shipping.

## 2020-10-06 NOTE — Telephone Encounter (Signed)
Pt came to office today to check status of Ozempic (coming from Pt assistance at [employer])- informed that I have not seen it come yet. I called [employer] at [phone] to check status- it is in process of being shipped instructed to recheck in 1-2 days, due to heat shipping may have been delayed. I currently do not have a sample pen I can give her.

## 2020-10-07 NOTE — Telephone Encounter (Signed)
Medication was received today. Patient said she will come pick up today. Advised medication will be by the nurse station due to needs to stay refrigerated.

## 2020-10-07 NOTE — Telephone Encounter (Signed)
Received Ozempic supply, Rod Holler will call Pt to let her know.

## 2020-11-10 ENCOUNTER — Telehealth: Payer: Self-pay | Admitting: Internal Medicine

## 2020-11-10 ENCOUNTER — Other Ambulatory Visit: Payer: Self-pay

## 2020-11-10 MED ORDER — AMOXICILLIN-POT CLAVULANATE 875-125 MG PO TABS: 1.0000 | ORAL_TABLET | Freq: Two times a day (BID) | ORAL | 0 refills | Status: DC

## 2020-11-10 NOTE — Telephone Encounter (Signed)
Pt with h/o diverticulitis calling. States she started having some discomfort Sunday am but it has gotten worse. The pain is in her LLQ. Reports she has not had a temperature. She is requesting some medication be sent to the pharmacy for her, please advise.

## 2020-11-10 NOTE — Telephone Encounter (Signed)
Spoke with pt and she is aware. Script sent to pharmacy , scheduling to contact pt to set up OV with an APP for f/u.

## 2020-11-10 NOTE — Telephone Encounter (Signed)
Patient called said she thinks she is having a Diverticulitis flare up seeking advise.

## 2020-11-10 NOTE — Telephone Encounter (Signed)
1.  Low residue diet 2.  Augmentin 875 mg twice daily for 1 week 3.  Have her follow-up with advanced practitioner in the office next available

## 2020-11-23 ENCOUNTER — Ambulatory Visit (INDEPENDENT_AMBULATORY_CARE_PROVIDER_SITE_OTHER): Payer: Medicare Other | Admitting: Internal Medicine

## 2020-11-23 ENCOUNTER — Other Ambulatory Visit: Payer: Self-pay

## 2020-11-23 ENCOUNTER — Encounter: Payer: Self-pay | Admitting: Internal Medicine

## 2020-11-23 VITALS — BP 116/80 | HR 66 | Temp 98.0°F | Resp 16 | Ht 61.0 in | Wt 131.4 lb

## 2020-11-23 DIAGNOSIS — F419 Anxiety disorder, unspecified: Secondary | ICD-10-CM

## 2020-11-23 DIAGNOSIS — Z0001 Encounter for general adult medical examination with abnormal findings: Secondary | ICD-10-CM

## 2020-11-23 DIAGNOSIS — Z79899 Other long term (current) drug therapy: Secondary | ICD-10-CM | POA: Diagnosis not present

## 2020-11-23 DIAGNOSIS — E119 Type 2 diabetes mellitus without complications: Secondary | ICD-10-CM | POA: Diagnosis not present

## 2020-11-23 DIAGNOSIS — Z0000 Encounter for general adult medical examination without abnormal findings: Secondary | ICD-10-CM

## 2020-11-23 DIAGNOSIS — I10 Essential (primary) hypertension: Secondary | ICD-10-CM | POA: Diagnosis not present

## 2020-11-23 DIAGNOSIS — R600 Localized edema: Secondary | ICD-10-CM

## 2020-11-23 LAB — CBC WITH DIFFERENTIAL/PLATELET
Basophils Absolute: 0.1 10*3/uL (ref 0.0–0.1)
Basophils Relative: 1.2 % (ref 0.0–3.0)
Eosinophils Absolute: 0.2 10*3/uL (ref 0.0–0.7)
Eosinophils Relative: 2.7 % (ref 0.0–5.0)
HCT: 39.7 % (ref 36.0–46.0)
Hemoglobin: 13.4 g/dL (ref 12.0–15.0)
Lymphocytes Relative: 25.9 % (ref 12.0–46.0)
Lymphs Abs: 2.2 10*3/uL (ref 0.7–4.0)
MCHC: 33.7 g/dL (ref 30.0–36.0)
MCV: 90.7 fl (ref 78.0–100.0)
Monocytes Absolute: 0.6 10*3/uL (ref 0.1–1.0)
Monocytes Relative: 7.3 % (ref 3.0–12.0)
Neutro Abs: 5.3 10*3/uL (ref 1.4–7.7)
Neutrophils Relative %: 62.9 % (ref 43.0–77.0)
Platelets: 355 10*3/uL (ref 150.0–400.0)
RBC: 4.37 Mil/uL (ref 3.87–5.11)
RDW: 12.8 % (ref 11.5–15.5)
WBC: 8.5 10*3/uL (ref 4.0–10.5)

## 2020-11-23 LAB — HEMOGLOBIN A1C: Hgb A1c MFr Bld: 6 % (ref 4.6–6.5)

## 2020-11-23 MED ORDER — METHOCARBAMOL 500 MG PO TABS: 500.0000 mg | ORAL_TABLET | Freq: Every evening | ORAL | 2 refills | Status: DC | PRN

## 2020-11-23 NOTE — Progress Notes (Signed)
Subjective:    Patient ID: Patricia Moore, female    DOB: [birth year], 87 y.o.   MRN: [id number]  DOS:  11/23/2020 Type of visit - description: CPX Here for CPX, we also discussed other issues DM: Not taking glyburide Continue with leg cramps, mostly from the knee down, worse on the left. Has occasional left-sided neck pain with radiation to the left arm.   Review of Systems  Other than above, a 14 point review of systems is negative      Past Medical History:  Diagnosis Date   Anxiety 12/09/2013   Arthritis    Cataract    Diabetes mellitus    Diverticulitis    patient states has had three attacks    Diverticulosis    DJD (degenerative joint disease) 12/14/2014   Glaucoma    H. pylori infection 2001   Heart murmur    Hx of adenomatous colonic polyps    Hyperlipidemia    Hypertension    Intraductal papilloma    bx nef x 2 in the 70s-90s   Saccular aneurysm    SCC (squamous cell carcinoma) 03/2011   sees dermatology    Past Surgical History:  Procedure Laterality Date   BREAST BIOPSY  '91, '95   INTRADUCTAL PAPILLOMA   BREAST LUMPECTOMY     CATARACT EXTRACTION Bilateral 09/2014   CLOSED REDUCTION ULNAR SHAFT Right 10/06/2017   Procedure: CLOSED REDUCTION DISLOCATED RIGHT SHOULDER;  Surgeon: Kathryne Hitch, MD;  Location: [facility] OR;  Service: Orthopedics;  Laterality: Right;   COLONOSCOPY     Social History   Socioeconomic History   Marital status: Married    Spouse name: Not on file   Number of children: 1   Years of education: Not on file   Highest education level: Not on file  Occupational History   Occupation: retired     Associate Professor: HOMEMAKER  Tobacco Use   Smoking status: Never   Smokeless tobacco: Never  Vaping Use   Vaping Use: Never used  Substance and Sexual Activity   Alcohol use: Never   Drug use: No   Sexual activity: Not Currently  Other Topics Concern   Not on file  Social History Narrative   Born in [place], ,moved to the [place] in the  60s, live in [place] then in [place]   Lives w/ husband   H.S. Buyer, retail   Son lives in [place])    Social Determinants of Health   Financial Resource Strain: Not on file  Food Insecurity: Not on file  Transportation Needs: Not on file  Physical Activity: Not on file  Stress: Not on file  Social Connections: Not on file  Intimate Partner Violence: Not on file    Allergies as of 11/23/2020       Reactions   Ciprofloxacin Nausea And Vomiting        Medication List        Accurate as of November 23, 2020 11:59 PM. If you have any questions, ask your nurse or doctor.          STOP taking these medications    amoxicillin-clavulanate 875-125 MG tablet Commonly known as: AUGMENTIN Stopped by: Willow Ora, MD   glyBURIDE 2.5 MG tablet Commonly known as: DIABETA Stopped by: Willow Ora, MD       TAKE these medications    ALPRAZolam 0.25 MG tablet Commonly known as: XANAX Take 1 tablet (0.25 mg total) by mouth daily as needed for anxiety.   carvedilol  25 MG tablet Commonly known as: COREG Take 1 tablet (25 mg total) by mouth 2 (two) times daily with a meal.   cholecalciferol 1000 units tablet Commonly known as: VITAMIN D Take 1,000 Units by mouth daily.   co-enzyme Q-10 30 MG capsule Take 30 mg by mouth daily.   dorzolamide-timolol 22.3-6.8 MG/ML ophthalmic solution Commonly known as: COSOPT Place 1 drop into both eyes 2 (two) times daily.   gabapentin 100 MG capsule Commonly known as: Neurontin Take 1 capsule (100 mg total) by mouth at bedtime.   Insulin Pen Needle 31G X 5 MM Misc To use w/ Victoza   metFORMIN 1000 MG tablet Commonly known as: GLUCOPHAGE TAKE 1 TABLET BY MOUTH WITH BREAKFAST, 1 TABLET WITH LUNCH AND 1/2 TABLET WITH DINNER   methocarbamol 500 MG tablet Commonly known as: Robaxin Take 1 tablet (500 mg total) by mouth at bedtime as needed for muscle spasms. Started by: Willow Ora, MD   OneTouch Ultra test strip Generic drug: glucose blood CHECK  BLOOD SUGAR NO MORE THAN 2 TIMES DAILY   onetouch ultrasoft lancets CHECK BLOOD SUGAR NO MORE THAN 2 TIMES PER DAY   Ozempic (0.25 or 0.5 MG/DOSE) 2 MG/1.5ML Sopn Generic drug: Semaglutide(0.25 or 0.5MG /DOS) Inject 0.5 mg into the skin once a week.   Restasis 0.05 % ophthalmic emulsion Generic drug: cycloSPORINE   vitamin B-12 250 MCG tablet Commonly known as: CYANOCOBALAMIN Take 250 mcg by mouth daily.           Objective:   Physical Exam BP 116/80 (BP Location: Left Arm, Patient Position: Sitting, Cuff Size: Small)   Pulse 66   Temp 98 F (36.7 C) (Oral)   Resp 16   Ht 5\' 1"  (1.549 m)   Wt 131 lb 6 oz (59.6 kg)   LMP 04/17/1988   SpO2 97%   BMI 24.82 kg/m  General: Well developed, NAD, BMI noted Neck: No  thyromegaly  HEENT:  Normocephalic . Face symmetric, atraumatic Lungs:  CTA B Normal respiratory effort, no intercostal retractions, no accessory muscle use. Heart: RRR,  no murmur.  Abdomen:  Not distended, soft, non-tender. No rebound or rigidity.   DM foot exam: No edema, good pedal pulses, pinprick examination normal Skin: Exposed areas without rash. Not pale. Not jaundice Neurologic:  alert & oriented X3.  Speech normal, gait appropriate for age and unassisted Strength symmetric and appropriate for age.  Psych: Cognition and judgment appear intact.  Cooperative with normal attention span and concentration.  Behavior appropriate. No anxious or depressed appearing.     Assessment    Assessment  DM HTN -Losartan d/c d/t hyperkalemia -Amlodipine d/c d/t edema 07-2017 - HCTZ d/c d/t hyperkalemia see note 08-27-17 Hyperlipidemia -- intolerant to lipitor and crestor (severe cramps).  Declined further trial with medications  on 01/16/2018   Anxiety- xanax rx by pcp Vertigo - valium prn GI: --Recurrent diverticulitis (one episode documented by a CT 11-2014) --H. pylori 2001 DJD--back pain, knees, neck pain ( on baclofen for neck) Osteoporosis :  Tscore -1.9 on 11-2015 , T score  -1.8 (03/2019); h/o  R ulnar FX 2019 SCC, skin cancer Elevated homocysteine Raynaud's phenomena  NEURO --Carotid US 10-2013: No significant plaque, although ICA's are serpentine, bilaterally. 40-59% bilateral ICA stenosis by velocity criteria likely due to tortuosity. Patent vertebral arteries with antegrade flow.Normal subclavian arteries, bilaterally. -- Transinet dizziness, diplopia sx late 2016 , saw neuro >>> Dx w/ TIA/Stroke on clinical grounds  W/u:  Brain  MRI 03-2015 >>  2 mm saccular aneurysm at the right posterior communicating artery origin, saw  Neurology, Rx to recheck MRI 1 year, ok continue ASA per neuro CTA head, neck 05-14-2015: aneurysm confirmed, otherwise (-) --brain aneurysm see above, saw neuro 2018.  MRA head 08/2019 stable, recheck 5 years per neuro --Admitted 07-2015, dizziness, MRI negative, DX labyrinthitis   PLAN: Here for CPX DM: Foot exam normal, checking an A1c, since the last visit, she is stop glyburide, Ambulatory CBGs never more than 140.  Plan: Continue metformin, Ozempic. HTN: Well-controlled, continue carvedilol.  Check CBC at patient request, she thinks something is wrong with the platelets. Leg cramps: As described above, normal vascular exam, tonic water did not help, trial with Robaxin at night. Neck pain: Also reports occasional neck pain on the left side with radiation to the left arm, offered referral, declined at Edema: At the end of the day, no edema on today's exam, recommend low salt diet and leg elevation.  She also uses a compression stocking some days. Anxiety: On Xanax, check UDS Osteopenia: T score -1.25 March 2019, recheck DEXA on RTC RTC 6 months   In addition to CPX, I addressed all her chronic medical problems, she also reported leg cramps and lower extremity edema, issues were addressed as well. This visit occurred during the SARS-CoV-2 public health emergency.  Safety protocols were in place,  including screening questions prior to the visit, additional usage of staff PPE, and extensive cleaning of exam room while observing appropriate contact time as indicated for disinfecting solutions.

## 2020-11-23 NOTE — Patient Instructions (Addendum)
Recommend to get at your pharmacy- Shingrix- this is a 2 dose series-the 2nd dose will need to be 2-6 months after the first.     GO TO THE LAB : Get the blood work     [place], [place] back for   a checkup in 6 months     "Living will", "[place] of attorney": Advanced care planning  (If you already have a living will or healthcare power of attorney, please bring the copy to be scanned in your chart.)  Advance care planning is a process that supports adults in  understanding and sharing their preferences regarding future medical care.   The patient's preferences are recorded in documents called Advance Directives.    Advanced directives are completed (and can be modified at any time) while the patient is in full mental capacity.   The documentation should be available at all times to the patient, the family and the healthcare providers.  Bring in a copy to be scanned in your chart is an excellent idea and is recommended   This legal documents direct treatment decision making and/or appoint a surrogate to make the decision if the patient is not capable to do so.    Advance directives can be documented in many types of formats,  documents have names such as:  Lliving will  Durable power of attorney for healthcare (healthcare proxy or healthcare power of attorney)  Combined directives  Physician orders for life-sustaining treatment    More information at:  [email or web address]

## 2020-11-24 ENCOUNTER — Encounter: Payer: Self-pay | Admitting: Internal Medicine

## 2020-11-24 LAB — DRUG MONITORING, PANEL 8 WITH CONFIRMATION, URINE
6 Acetylmorphine: NEGATIVE ng/mL (ref <10)
Alcohol Metabolites: NEGATIVE ng/mL (ref <500)
Amphetamines: NEGATIVE ng/mL (ref <500)
Benzodiazepines: NEGATIVE ng/mL (ref <100)
Buprenorphine, Urine: NEGATIVE ng/mL (ref <5)
Cocaine Metabolite: NEGATIVE ng/mL (ref <150)
Creatinine: 66 mg/dL (ref >=20.0)
MDMA: NEGATIVE ng/mL (ref <500)
Marijuana Metabolite: NEGATIVE ng/mL (ref <20)
Opiates: NEGATIVE ng/mL (ref <100)
Oxidant: NEGATIVE ug/mL (ref <200)
Oxycodone: NEGATIVE ng/mL (ref <100)
pH: 8.2 (ref 4.5–9.0)

## 2020-11-24 LAB — DM TEMPLATE

## 2020-11-24 NOTE — Assessment & Plan Note (Signed)
-   Td 2019 -Pneumonia shot 2014 declined booster d/t h/o poor tolerance; prevnar:01-2015 -zostavax: 2011.  Shingrix discussed - s/p covid shots x 4 CCS: -CCS:  colonoscopy 10-2013, + polyps, colonoscopy 04/02/2019, + polyps, diverticulosis, No follow-up recommended - Female care :  last gyn OV 02/2019;  h/o fibroid tumor, no treatment necessary unless she is symptomatic. MMG 01-2020 -Counseled: Diet and exercise. -Labs: Reviewed, check A1c.  UDS. -POA discussed

## 2020-11-24 NOTE — Assessment & Plan Note (Signed)
Here for CPX DM: Foot exam normal, checking an A1c, since the last visit, she is stop glyburide, Ambulatory CBGs never more than 140.  Plan: Continue metformin, Ozempic. HTN: Well-controlled, continue carvedilol.  Check CBC at patient request, she thinks something is wrong with the platelets. Leg cramps: As described above, normal vascular exam, tonic water did not help, trial with Robaxin at night. Neck pain: Also reports occasional neck pain on the left side with radiation to the left arm, offered referral, declined at Edema: At the end of the day, no edema on today's exam, recommend low salt diet and leg elevation.  She also uses a compression stocking some days. Anxiety: On Xanax, check UDS Osteopenia: T score -1.25 March 2019, recheck DEXA on RTC RTC 6 months

## 2020-11-30 ENCOUNTER — Telehealth: Payer: Self-pay | Admitting: Internal Medicine

## 2020-11-30 NOTE — Telephone Encounter (Signed)
Left message for patient to call back and schedule Medicare Annual Wellness Visit (AWV) in office.   If not able to come in office, please offer to do virtually or by telephone.  Left office number and my jabber [phone].  Last AWV:09/09/2019  Please schedule at anytime with Nurse Health Advisor.

## 2021-01-06 DIAGNOSIS — Z1231 Encounter for screening mammogram for malignant neoplasm of breast: Secondary | ICD-10-CM | POA: Diagnosis not present

## 2021-01-06 LAB — HM MAMMOGRAPHY

## 2021-01-07 ENCOUNTER — Encounter: Payer: Self-pay | Admitting: Internal Medicine

## 2021-01-07 ENCOUNTER — Encounter: Payer: Self-pay | Admitting: Obstetrics & Gynecology
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
## 2021-01-24 ENCOUNTER — Ambulatory Visit: Payer: Medicare Other | Admitting: Neurology

## 2021-01-24 ENCOUNTER — Encounter: Payer: Self-pay | Admitting: Neurology

## 2021-01-24 ENCOUNTER — Other Ambulatory Visit: Payer: Self-pay

## 2021-01-24 VITALS — BP 139/77 | HR 84 | Resp 18 | Ht 61.0 in | Wt 134.0 lb

## 2021-01-24 DIAGNOSIS — E1142 Type 2 diabetes mellitus with diabetic polyneuropathy: Secondary | ICD-10-CM

## 2021-01-24 DIAGNOSIS — M4722 Other spondylosis with radiculopathy, cervical region: Secondary | ICD-10-CM | POA: Diagnosis not present

## 2021-01-24 DIAGNOSIS — G4762 Sleep related leg cramps: Secondary | ICD-10-CM

## 2021-01-24 DIAGNOSIS — I671 Cerebral aneurysm, nonruptured: Secondary | ICD-10-CM

## 2021-01-24 MED ORDER — GABAPENTIN 100 MG PO CAPS: 200.0000 mg | ORAL_CAPSULE | Freq: Every day | ORAL | 5 refills | Status: DC

## 2021-01-24 NOTE — Patient Instructions (Signed)
Increase gabapentin to 200mg  at bedtime to help with neck pain and leg pain Check MRA of head Follow up 6 months.

## 2021-01-25 ENCOUNTER — Other Ambulatory Visit: Payer: Self-pay | Admitting: Neurology

## 2021-01-25 ENCOUNTER — Telehealth: Payer: Self-pay

## 2021-01-25 DIAGNOSIS — I671 Cerebral aneurysm, nonruptured: Secondary | ICD-10-CM

## 2021-01-25 NOTE — Telephone Encounter (Signed)
Spoke to [place], new order add for MRA head W/O Contrast

## 2021-01-26 ENCOUNTER — Other Ambulatory Visit: Payer: Self-pay | Admitting: Internal Medicine

## 2021-02-07 ENCOUNTER — Other Ambulatory Visit: Payer: Self-pay

## 2021-02-07 ENCOUNTER — Ambulatory Visit
Admission: RE | Admit: 2021-02-07 | Discharge: 2021-02-07 | Disposition: A | Discharge status: Home / Self Care | Payer: Medicare Other | Source: Ambulatory Visit | Attending: Neurology | Admitting: Neurology

## 2021-02-07 DIAGNOSIS — I671 Cerebral aneurysm, nonruptured: Secondary | ICD-10-CM | POA: Diagnosis not present

## 2021-02-08 ENCOUNTER — Telehealth: Payer: Self-pay | Admitting: Neurology

## 2021-02-08 NOTE — Telephone Encounter (Signed)
Pt states that someone called her and she thinks it was about her MRI results  please call

## 2021-02-09 NOTE — Telephone Encounter (Signed)
Pt advised of her MRA head.

## 2021-02-23 ENCOUNTER — Other Ambulatory Visit: Payer: Self-pay

## 2021-02-23 ENCOUNTER — Encounter: Payer: Self-pay | Admitting: Obstetrics & Gynecology

## 2021-02-23 ENCOUNTER — Ambulatory Visit (INDEPENDENT_AMBULATORY_CARE_PROVIDER_SITE_OTHER): Payer: Medicare Other | Admitting: Obstetrics & Gynecology

## 2021-02-23 VITALS — BP 108/62 | HR 66 | Resp 14 | Ht 61.25 in | Wt 133.0 lb

## 2021-02-23 DIAGNOSIS — Z01419 Encounter for gynecological examination (general) (routine) without abnormal findings: Secondary | ICD-10-CM | POA: Diagnosis not present

## 2021-02-23 DIAGNOSIS — Z9189 Other specified personal risk factors, not elsewhere classified: Secondary | ICD-10-CM | POA: Diagnosis not present

## 2021-02-23 DIAGNOSIS — M8589 Other specified disorders of bone density and structure, multiple sites: Secondary | ICD-10-CM

## 2021-02-23 DIAGNOSIS — Z780 Asymptomatic menopausal state: Secondary | ICD-10-CM

## 2021-02-23 DIAGNOSIS — M810 Age-related osteoporosis without current pathological fracture: Secondary | ICD-10-CM

## 2021-02-23 NOTE — Progress Notes (Signed)
Patricia Moore 04/11/1934 [id number]   History:    87 y.o. G2P1A1L1 Married.   RP:  Established patient presenting for annual gyn exam    HPI: Postmenopause, well on no HRT.  No PMB.  No pelvic pain.  Last pap neg 2012.  Abstinent.  Urine normal.  Constipation.  Breasts normal.  Mammo 12/2020 Neg.  BMI 24.93.  Last BD 02/2019 Osteopenia T-Score -1.8 at [facility].  Health labs with Fam MD. Patricia Moore 2020.     Past medical history,surgical history, family history and social history were all reviewed and documented in the EPIC chart.  Gynecologic History Patient's last menstrual period was 04/17/1988.  Obstetric History OB History  Gravida Para Term Preterm AB Living  2 1     1 1   SAB IAB Ectopic Multiple Live Births  1            # Outcome Date GA Lbr Len/2nd Weight Sex Delivery Anes PTL Lv  2 SAB           1 Para              ROS: A ROS was performed and pertinent positives and negatives are included in the history.  GENERAL: No fevers or chills. HEENT: No change in vision, no earache, sore throat or sinus congestion. NECK: No pain or stiffness. CARDIOVASCULAR: No chest pain or pressure. No palpitations. PULMONARY: No shortness of breath, cough or wheeze. GASTROINTESTINAL: No abdominal pain, nausea, vomiting or diarrhea, melena or bright red blood per rectum. GENITOURINARY: No urinary frequency, urgency, hesitancy or dysuria. MUSCULOSKELETAL: No joint or muscle pain, no back pain, no recent trauma. DERMATOLOGIC: No rash, no itching, no lesions. ENDOCRINE: No polyuria, polydipsia, no heat or cold intolerance. No recent change in weight. HEMATOLOGICAL: No anemia or easy bruising or bleeding. NEUROLOGIC: No headache, seizures, numbness, tingling or weakness. PSYCHIATRIC: No depression, no loss of interest in normal activity or change in sleep pattern.     Exam:   BP 108/62   Pulse 66   Resp 14   Ht 5' 1.25" (1.556 m)   Wt 133 lb (60.3 kg)   LMP 04/17/1988   BMI 24.93 kg/m   Body  mass index is 24.93 kg/m.  General appearance : Well developed well nourished female. No acute distress HEENT: Eyes: no retinal hemorrhage or exudates,  Neck supple, trachea midline, no carotid bruits, no thyroidmegaly Lungs: Clear to auscultation, no rhonchi or wheezes, or rib retractions  Heart: Regular rate and rhythm, no murmurs or gallops Breast:Examined in sitting and supine position were symmetrical in appearance, no palpable masses or tenderness,  no skin retraction, no nipple inversion, no nipple discharge, no skin discoloration, no axillary or supraclavicular lymphadenopathy Abdomen: no palpable masses or tenderness, no rebound or guarding Extremities: no edema or skin discoloration or tenderness  Pelvic: Vulva: Normal             Vagina: No gross lesions or discharge  Cervix: No gross lesions or discharge  Uterus  AV, normal size, shape and consistency, non-tender and mobile  Adnexa  Without masses or tenderness  Anus: Normal   Assessment/Plan:  87 y.o. female for annual exam   1. Wellness Gynecologic exam at risk of fracture due to osteopenia Postmenopause, well on no HRT.  No PMB.  No pelvic pain.  Last pap neg 2012.  Abstinent.  No indication for a Pap at this time.  Urine normal.  Constipation.  Breasts normal.  Mammo 12/2020 Neg.  BMI 24.93. Walking daily.  Good nutrition.  Last BD 02/2019 Osteopenia T-Score -1.8 at [facility], will schedule a repeat BD now.  Health labs with Fam MD. Patricia Moore 2020.    2. Postmenopause Well on no HRT.  No PMB.  3. Osteopenia of multiple sites  Last BD 02/2019 Osteopenia T-Score -1.8 at [facility], will schedule a repeat BD now.  Ca++ 1.5 g/d total, Vit D supplement, continue regular weight bearing physical activities.  Patricia Del MD, 11:14 AM 02/23/2021

## 2021-03-01 ENCOUNTER — Telehealth: Payer: Self-pay | Admitting: Internal Medicine

## 2021-03-01 NOTE — Telephone Encounter (Signed)
Requesting: alprazolam 0.25mg  Contract: 02/10/2020 UDS: 11/23/2020 Last Visit: 11/23/2020 Next Visit: 05/26/21 Last Refill: 07/20/2020 #30 and 2rf  Please Advise

## 2021-03-01 NOTE — Telephone Encounter (Signed)
PDMP ok, rx sent  

## 2021-03-28 ENCOUNTER — Other Ambulatory Visit: Payer: Self-pay | Admitting: Internal Medicine

## 2021-04-21 DIAGNOSIS — L820 Inflamed seborrheic keratosis: Secondary | ICD-10-CM | POA: Diagnosis not present

## 2021-04-25 DIAGNOSIS — H0100A Unspecified blepharitis right eye, upper and lower eyelids: Secondary | ICD-10-CM | POA: Diagnosis not present

## 2021-04-25 DIAGNOSIS — H0100B Unspecified blepharitis left eye, upper and lower eyelids: Secondary | ICD-10-CM | POA: Diagnosis not present

## 2021-04-25 DIAGNOSIS — H04123 Dry eye syndrome of bilateral lacrimal glands: Secondary | ICD-10-CM | POA: Diagnosis not present

## 2021-04-25 DIAGNOSIS — H401131 Primary open-angle glaucoma, bilateral, mild stage: Secondary | ICD-10-CM | POA: Diagnosis not present

## 2021-04-25 LAB — HM DIABETES EYE EXAM

## 2021-05-10 ENCOUNTER — Other Ambulatory Visit: Payer: Self-pay | Admitting: Internal Medicine

## 2021-05-24 ENCOUNTER — Telehealth: Payer: Self-pay

## 2021-05-24 NOTE — Telephone Encounter (Signed)
Informed Pt that Ozempic is here for pick up at her convenience. (Pt assistance)

## 2021-05-26 ENCOUNTER — Ambulatory Visit (INDEPENDENT_AMBULATORY_CARE_PROVIDER_SITE_OTHER): Payer: Medicare Other | Admitting: Internal Medicine

## 2021-05-26 ENCOUNTER — Encounter: Payer: Self-pay | Admitting: Internal Medicine

## 2021-05-26 VITALS — BP 136/80 | HR 74 | Temp 98.0°F | Resp 18 | Ht 61.25 in | Wt 132.1 lb

## 2021-05-26 DIAGNOSIS — E559 Vitamin D deficiency, unspecified: Secondary | ICD-10-CM | POA: Diagnosis not present

## 2021-05-26 DIAGNOSIS — I10 Essential (primary) hypertension: Secondary | ICD-10-CM

## 2021-05-26 DIAGNOSIS — R21 Rash and other nonspecific skin eruption: Secondary | ICD-10-CM

## 2021-05-26 DIAGNOSIS — E119 Type 2 diabetes mellitus without complications: Secondary | ICD-10-CM | POA: Diagnosis not present

## 2021-05-26 DIAGNOSIS — R7989 Other specified abnormal findings of blood chemistry: Secondary | ICD-10-CM

## 2021-05-26 DIAGNOSIS — M159 Polyosteoarthritis, unspecified: Secondary | ICD-10-CM

## 2021-05-26 DIAGNOSIS — E785 Hyperlipidemia, unspecified: Secondary | ICD-10-CM | POA: Diagnosis not present

## 2021-05-26 LAB — HEMOGLOBIN A1C: Hgb A1c MFr Bld: 6 % (ref 4.6–6.5)

## 2021-05-26 LAB — BASIC METABOLIC PANEL
BUN: 20 mg/dL (ref 6–23)
CO2: 35 mEq/L — ABNORMAL HIGH (ref 19–32)
Calcium: 9.9 mg/dL (ref 8.4–10.5)
Chloride: 101 mEq/L (ref 96–112)
Creatinine, Ser: 0.93 mg/dL (ref 0.40–1.20)
GFR: 56.1 mL/min — ABNORMAL LOW (ref >60.00)
Glucose, Bld: 105 mg/dL — ABNORMAL HIGH (ref 70–99)
Potassium: 4.4 mEq/L (ref 3.5–5.1)
Sodium: 140 mEq/L (ref 135–145)

## 2021-05-26 LAB — LIPID PANEL
Cholesterol: 192 mg/dL (ref 0–200)
HDL: 64.6 mg/dL (ref >39.00)
LDL Cholesterol: 109 mg/dL — ABNORMAL HIGH (ref 0–99)
NonHDL: 127.12
Total CHOL/HDL Ratio: 3
Triglycerides: 93 mg/dL (ref 0.0–149.0)
VLDL: 18.6 mg/dL (ref 0.0–40.0)

## 2021-05-26 LAB — VITAMIN D 25 HYDROXY (VIT D DEFICIENCY, FRACTURES): VITD: 95.93 ng/mL (ref 30.00–100.00)

## 2021-05-26 LAB — AST: AST: 19 U/L (ref 0–37)

## 2021-05-26 LAB — ALT: ALT: 14 U/L (ref 0–35)

## 2021-05-26 MED ORDER — NYSTATIN 100000 UNIT/GM EX POWD: 1.0000 "application " | Freq: Two times a day (BID) | CUTANEOUS | 0 refills | Status: DC

## 2021-05-26 MED ORDER — ALPRAZOLAM 0.25 MG PO TABS: ORAL_TABLET | ORAL | 4 refills | Status: DC

## 2021-05-26 NOTE — Assessment & Plan Note (Signed)
DM: Last A1c August 2022 was 6.0.  Continue metformin, check A1c. HTN: Seems controlled, continue carvedilol.  Check BMP High cholesterol: Intolerant to Lipitor and Crestor.  Check FLP, we could offer a trial w/ Pravachol. Anxiety: RF Xanax, PDMP okay, contract signed. Cervical spondylosis, DM polyneuropathy, brain aneurysm, leg cramps. Saw neurology 01/24/2021, was evaluated for above  They recommended to increase gabapentin dose at night to help with leg cramps  >>> Self d/c gaba, no help, increased dizzines. Pt wonders about Voltaren gel, that is ok. Elevated homocystine: On T01 and folic acid.  Checking levels Vitamin D deficiency: On vitamin D OTC.  Labs. Rash: Episodes of  axilary rash, requests nystatin powder for prn use, rx sent. Osteoporosis: f/u by gyn, see gyn note from 02/23/2021 Preventive care: [facility] gynecology November 2022.  COVID booster recommended RTC 6 months

## 2021-05-26 NOTE — Progress Notes (Signed)
Subjective:    Patient ID: Patricia Moore, female    DOB: October 06, 1933, 88 y.o.   MRN: [id number]  DOS:  05/26/2021 Type of visit - description: rov  Since the last office visit is doing well. Saw gynecology and neurology, notes reviewed. Still has occasional palpitation without associated LOC or SS CP. Denies any GI problems such as nausea, vomiting or diarrhea.   Review of Systems See above   Past Medical History:  Diagnosis Date   Anxiety 12/09/2013   Arthritis    Broken bones    face   Cataract    Diabetes mellitus    Diverticulitis    patient states has had three attacks    Diverticulosis    DJD (degenerative joint disease) 12/14/2014   Glaucoma    H. pylori infection 2001   Heart murmur    Hx of adenomatous colonic polyps    Hyperlipidemia    Hypertension    Intraductal papilloma    bx nef x 2 in the 70s-90s   Saccular aneurysm    SCC (squamous cell carcinoma) 03/2011   sees dermatology    Past Surgical History:  Procedure Laterality Date   BREAST BIOPSY  '91, '95   INTRADUCTAL PAPILLOMA   BREAST LUMPECTOMY     CATARACT EXTRACTION Bilateral 09/2014   CLOSED REDUCTION ULNAR SHAFT Right 10/06/2017   Procedure: CLOSED REDUCTION DISLOCATED RIGHT SHOULDER;  Surgeon: Kathryne Hitch, MD;  Location: [facility] OR;  Service: Orthopedics;  Laterality: Right;   COLONOSCOPY      Current Outpatient Medications  Medication Instructions   ALPRAZolam (XANAX) 0.25 MG tablet TAKE 1 TABLET(0.25 MG) BY MOUTH DAILY AS NEEDED FOR ANXIETY   carvedilol (COREG) 25 MG tablet TAKE 1 TABLET(25 MG) BY MOUTH TWICE DAILY WITH A MEAL   cholecalciferol (VITAMIN D) 1,000 Units, Oral, Daily   co-enzyme Q-10 30 mg, Oral, Daily   dorzolamide-timolol (COSOPT) 22.3-6.8 MG/ML ophthalmic solution 1 drop, Both Eyes, 2 times daily   folic acid (FOLVITE) 1 MG tablet TAKE 1 TABLET(1 MG) BY MOUTH DAILY   gabapentin (NEURONTIN) 200 mg, Oral, Daily at bedtime   Insulin Pen Needle 31G X 5 MM MISC To  use w/ Victoza   Lancets (ONETOUCH ULTRASOFT) lancets TEST BLOOD SUGAR NO MORE THEN TWICE DAILY   metFORMIN (GLUCOPHAGE) 1000 MG tablet TAKE 1 TABLET BY MOUTH WITH BREAKFAST, 1 TABLET WITH LUNCH AND 1/2 TABLET AT BEDTIME   ONETOUCH ULTRA test strip CHECK BLOOD SUGAR NO MORE THAN 2 TIMES DAILY   Ozempic (0.25 or 0.5 MG/DOSE) 0.5 mg, Subcutaneous, Weekly   RESTASIS 0.05 % ophthalmic emulsion No dose, route, or frequency recorded.   vitamin B-12 (CYANOCOBALAMIN) 250 mcg, Oral, Daily       Objective:   Physical Exam BP 136/80 (BP Location: Left Arm, Patient Position: Sitting, Cuff Size: Small)    Pulse 74    Temp 98 F (36.7 C) (Oral)    Resp 18    Ht 5' 1.25" (1.556 m)    Wt 132 lb 2 oz (59.9 kg)    LMP 04/17/1988    SpO2 96%    BMI 24.76 kg/m  General:   Well developed, NAD, BMI noted. HEENT:  Normocephalic . Face symmetric, atraumatic Lungs:  CTA B Normal respiratory effort, no intercostal retractions, no accessory muscle use. Heart: RRR,  no murmur.  Lower extremities: no pretibial edema bilaterally  Skin: Not pale. Not jaundice Neurologic:  alert & oriented X3.  Speech normal, gait appropriate for  age and unassisted Psych--  Cognition and judgment appear intact.  Cooperative with normal attention span and concentration.  Behavior appropriate. No anxious or depressed appearing.      Assessment     Assessment  DM HTN -Losartan d/c d/t hyperkalemia -Amlodipine d/c d/t edema 07-2017 - HCTZ d/c d/t hyperkalemia see note 08-27-17 Hyperlipidemia -- intolerant to lipitor and crestor (severe cramps).  Declined further trial with medications  on 01/16/2018   Anxiety- xanax rx by pcp Vertigo - valium prn GI: --Recurrent diverticulitis (one episode documented by a CT 11-2014) --H. pylori 2001 DJD--back pain, knees, neck pain ( on baclofen for neck) Osteoporosis : Tscore -1.9 on 11-2015 , T score  -1.8 (03/2019); h/o  R ulnar FX 2019 SCC, skin cancer Elevated  homocysteine Raynaud's phenomena  NEURO --Carotid US 10-2013: No significant plaque, although ICA's are serpentine, bilaterally. 40-59% bilateral ICA stenosis by velocity criteria likely due to tortuosity. Patent vertebral arteries with antegrade flow.Normal subclavian arteries, bilaterally. -- Transinet dizziness, diplopia sx late 2016 , saw neuro >>> Dx w/ TIA/Stroke on clinical grounds  W/u:  Brain  MRI 03-2015 >> 2 mm saccular aneurysm at the right posterior communicating artery origin, saw  Neurology, Rx to recheck MRI 1 year, ok continue ASA per neuro CTA head, neck 05-14-2015: aneurysm confirmed, otherwise (-) --brain aneurysm see above, saw neuro 2018.  MRA head 08/2019 stable, recheck 5 years per neuro --Admitted 07-2015, dizziness, MRI negative, DX labyrinthitis   PLAN: DM: Last A1c August 2022 was 6.0.  Continue metformin, check A1c. HTN: Seems controlled, continue carvedilol.  Check BMP High cholesterol: Intolerant to Lipitor and Crestor.  Check FLP, we could offer a trial w/ Pravachol. Anxiety: RF Xanax, PDMP okay, contract signed. Cervical spondylosis, DM polyneuropathy, brain aneurysm, leg cramps. Saw neurology 01/24/2021, was evaluated for above  They recommended to increase gabapentin dose at night to help with leg cramps  >>> Self d/c gaba, no help, increased dizzines. Pt wonders about Voltaren gel, that is ok. Elevated homocystine: On B12 and folic acid.  Checking levels Vitamin D deficiency: On vitamin D OTC.  Labs. Rash: Episodes of  axilary rash, requests nystatin powder for prn use, rx sent. Osteoporosis: f/u by gyn, see gyn note from 02/23/2021 Preventive care: [facility] gynecology November 2022.  COVID booster recommended RTC 6 months    This visit occurred during the SARS-CoV-2 public health emergency.  Safety protocols were in place, including screening questions prior to the visit, additional usage of staff PPE, and extensive cleaning of exam room while observing  appropriate contact time as indicated for disinfecting solutions.

## 2021-05-26 NOTE — Patient Instructions (Addendum)
Please consider COVID-vaccine booster   GO TO THE LAB : Get the blood work     [place], [place] Come back for   a physical exam in 6 months

## 2021-05-27 LAB — HOMOCYSTEINE: Homocysteine: 12.2 umol/L — ABNORMAL HIGH (ref <10.4)

## 2021-06-29 ENCOUNTER — Ambulatory Visit (HOSPITAL_COMMUNITY): Payer: Medicare Other | Attending: Cardiovascular Disease

## 2021-06-29 ENCOUNTER — Other Ambulatory Visit: Payer: Self-pay

## 2021-06-29 DIAGNOSIS — R002 Palpitations: Secondary | ICD-10-CM | POA: Insufficient documentation

## 2021-06-29 DIAGNOSIS — I340 Nonrheumatic mitral (valve) insufficiency: Secondary | ICD-10-CM | POA: Diagnosis not present

## 2021-06-29 DIAGNOSIS — R0602 Shortness of breath: Secondary | ICD-10-CM | POA: Diagnosis not present

## 2021-06-30 LAB — ECHOCARDIOGRAM COMPLETE
Area-P 1/2: 3.63 cm2
S' Lateral: 3.7 cm

## 2021-07-07 ENCOUNTER — Other Ambulatory Visit: Payer: Self-pay | Admitting: Internal Medicine

## 2021-07-07 NOTE — Progress Notes (Signed)
?  ?Cardiology Office Note ? ? ?Date:  07/08/2021  ? ?ID:  Patricia Moore, DOB 1933/05/24, MRN [id number] ? ?PCP:  Wanda Plump, MD  ?Cardiologist:   Rollene Rotunda, MD ? ? ?Chief Complaint  ?Patient presents with  ? Palpitations  ? ? ?  ?History of Present Illness: ?Patricia Moore is a 88 y.o. female who presents for follow up of a syncope.   This happened on February 2017.  She woke up and she felt dizzy.  She has been treated with meclizine.   She has been treated for labyrinthitis as well.  She had a normal echo.  She did have monitor in 2018 that demonstrated NSVT.    She had a negative POET (Plain Old Exercise Treadmill).  She has had brief runs of PSVT.  ? ?Since I last saw her she continues to have occasional chest discomfort going through to her back.  This seems to happen more at night.  She might sit up.  It might be associated with palpitations.  However, under careful questioning it is really not different than what she had when she had her last stress test.  She is able to walk 5 miles at a time without bringing on any symptoms.  She might get occasional palpitations lying on her left side.  She had documented SVT as above but she thinks her palpitations are actually less than before.  She gets some shoulder pains and joint pains.  She uses Voltaren cream.  She has not had any frank syncope.  She is not having any PND or orthopnea. ? ? ?Past Medical History:  ?Diagnosis Date  ? Anxiety 12/09/2013  ? Arthritis   ? Broken bones   ? face  ? Cataract   ? Diabetes mellitus   ? Diverticulitis   ? patient states has had three attacks   ? Diverticulosis   ? DJD (degenerative joint disease) 12/14/2014  ? Glaucoma   ? H. pylori infection 2001  ? Heart murmur   ? Hx of adenomatous colonic polyps   ? Hyperlipidemia   ? Hypertension   ? Intraductal papilloma   ? bx nef x 2 in the 70s-90s  ? Saccular aneurysm   ? SCC (squamous cell carcinoma) 03/2011  ? sees dermatology  ? ? ?Past Surgical History:  ?Procedure  Laterality Date  ? BREAST BIOPSY  '91, '95  ? INTRADUCTAL PAPILLOMA  ? BREAST LUMPECTOMY    ? CATARACT EXTRACTION Bilateral 09/2014  ? CLOSED REDUCTION ULNAR SHAFT Right 10/06/2017  ? Procedure: CLOSED REDUCTION DISLOCATED RIGHT SHOULDER;  Surgeon: Kathryne Hitch, MD;  Location: [facility] OR;  Service: Orthopedics;  Laterality: Right;  ? COLONOSCOPY    ? ? ? ?Current Outpatient Medications  ?Medication Sig Dispense Refill  ? ALPRAZolam (XANAX) 0.25 MG tablet TAKE 1 TABLET(0.25 MG) BY MOUTH DAILY AS NEEDED FOR ANXIETY 30 tablet 4  ? carvedilol (COREG) 25 MG tablet TAKE 1 TABLET(25 MG) BY MOUTH TWICE DAILY WITH A MEAL 180 tablet 1  ? cholecalciferol (VITAMIN D) 1000 UNITS tablet Take 1,000 Units by mouth daily.    ? co-enzyme Q-10 30 MG capsule Take 30 mg by mouth daily.    ? dorzolamide-timolol (COSOPT) 22.3-6.8 MG/ML ophthalmic solution Place 1 drop into both eyes 2 (two) times daily.     ? folic acid (FOLVITE) 1 MG tablet TAKE 1 TABLET(1 MG) BY MOUTH DAILY 90 tablet 3  ? Insulin Pen Needle 31G X 5 MM MISC To use w/  Victoza 100 each 2  ? Lancets (ONETOUCH ULTRASOFT) lancets TEST BLOOD SUGAR NO MORE THEN TWICE DAILY 200 each 12  ? metFORMIN (GLUCOPHAGE) 1000 MG tablet TAKE 1 TABLET BY MOUTH WITH BREAKFAST, 1 TABLET WITH LUNCH AND 1/2 TABLET AT BEDTIME 75 tablet 5  ? nystatin (MYCOSTATIN/NYSTOP) powder Apply 1 application topically 2 (two) times daily. 60 g 0  ? ONETOUCH ULTRA test strip CHECK BLOOD SUGAR NO MORE THAN 2 TIMES DAILY 200 strip 12  ? RESTASIS 0.05 % ophthalmic emulsion     ? Semaglutide,0.25 or 0.5MG /DOS, (OZEMPIC, 0.25 OR 0.5 MG/DOSE,) 2 MG/1.5ML SOPN Inject 0.5 mg into the skin once a week. 1.5 mL 3  ? vitamin B-12 (CYANOCOBALAMIN) 250 MCG tablet Take 250 mcg by mouth daily.    ? losartan-hydrochlorothiazide (HYZAAR) 100-12.5 MG tablet Take by mouth.    ? ?No current facility-administered medications for this visit.  ? ? ?Allergies:   Ciprofloxacin  ? ? ?ROS:  Please see the history of present  illness.   Otherwise, review of systems are positive for none.   All other systems are reviewed and negative.  ? ? ?PHYSICAL EXAM: ?VS:  BP (!) 168/82   Pulse 69   Ht 5\' 1"  (1.549 m)   Wt 132 lb (59.9 kg)   LMP 04/17/1988   SpO2 97%   BMI 24.94 kg/m?  , BMI Body mass index is 24.94 kg/m?. ?GENERAL:  Well appearing ?NECK:  No jugular venous distention, waveform within normal limits, carotid upstroke brisk and symmetric, no bruits, no thyromegaly ?LUNGS:  Clear to auscultation bilaterally ?CHEST:  Unremarkable ?HEART:  PMI not displaced or sustained,S1 and S2 within normal limits, no S3, no S4, no clicks, no rubs, 2 out of 6 brief apical systolic murmur radiating slightly up.  Outflow tract, no diastolic murmurs ?ABD:  Flat, positive bowel sounds normal in frequency in pitch, no bruits, no rebound, no guarding, no midline pulsatile mass, no hepatomegaly, no splenomegaly ?EXT:  2 plus pulses throughout, no edema, no cyanosis no clubbing ? ? ?EKG:  EKG is  ordered today. ?Sinus rhythm, rate 69, leftward axis, intervals within normal limits, no acute ST-T wave changes. ? ? ?Recent Labs: ?11/23/2020: Hemoglobin 13.4; Platelets 355.0 ?05/26/2021: ALT 14; BUN 20; Creatinine, Ser 0.93; Potassium 4.4; Sodium 140  ? ? ?Lipid Panel ?   ?Component Value Date/Time  ? CHOL 192 05/26/2021 1026  ? TRIG 93.0 05/26/2021 1026  ? HDL 64.60 05/26/2021 1026  ? CHOLHDL 3 05/26/2021 1026  ? VLDL 18.6 05/26/2021 1026  ? LDLCALC 109 (H) 05/26/2021 1026  ? LDLDIRECT 131.9 10/28/2010 1103  ? ?  ? ?Wt Readings from Last 3 Encounters:  ?07/08/21 132 lb (59.9 kg)  ?05/26/21 132 lb 2 oz (59.9 kg)  ?02/23/21 133 lb (60.3 kg)  ?  ? ? ?Other studies Reviewed: ?Additional studies/ records that were reviewed today include: Labs. ?Review of the above records demonstrates: See elsewhere ? ? ?ASSESSMENT AND PLAN: ? ? ?PALPITATIONS:    She prefers no therapy.  We have previously discussed Cardizem.  They are not any worse than they were before.  No change  in symptoms. ? ?MR:    This was very mild on recent echo and I will follow this up clinically. ? ?HTN:   Her blood pressure is elevated but labile.  She is going to get a new blood pressure cuff and keep a blood pressure diary and she may need further adjustments to her medicines.  I be happy  to review this.  ? ?Current medicines are reviewed at length with the patient today.  The patient does not have concerns and correct regarding medicines. ? ?The following changes have been made: None ? ?Labs/ tests ordered today include: None ? ?Orders Placed This Encounter  ?Procedures  ? EKG 12-Lead  ? ? ? ?Disposition:   FU with me in 1 year .   ? ? ?Signed, ?Rollene Rotunda, MD  ?07/08/2021 3:27 PM    ?[facility] ? ? ?

## 2021-07-08 ENCOUNTER — Ambulatory Visit (INDEPENDENT_AMBULATORY_CARE_PROVIDER_SITE_OTHER): Payer: Medicare Other | Admitting: Cardiology

## 2021-07-08 ENCOUNTER — Other Ambulatory Visit: Payer: Self-pay

## 2021-07-08 ENCOUNTER — Encounter: Payer: Self-pay | Admitting: Cardiology

## 2021-07-08 ENCOUNTER — Telehealth: Payer: Self-pay | Admitting: *Deleted

## 2021-07-08 VITALS — BP 168/82 | HR 69 | Ht 61.0 in | Wt 132.0 lb

## 2021-07-08 DIAGNOSIS — E119 Type 2 diabetes mellitus without complications: Secondary | ICD-10-CM

## 2021-07-08 DIAGNOSIS — R002 Palpitations: Secondary | ICD-10-CM | POA: Diagnosis not present

## 2021-07-08 DIAGNOSIS — I10 Essential (primary) hypertension: Secondary | ICD-10-CM | POA: Diagnosis not present

## 2021-07-08 DIAGNOSIS — I340 Nonrheumatic mitral (valve) insufficiency: Secondary | ICD-10-CM

## 2021-07-08 NOTE — Patient Instructions (Signed)

## 2021-07-08 NOTE — Chronic Care Management (AMB) (Signed)
?  Chronic Care Management  ? ?Outreach Note ? ?07/08/2021 ?Name: [name] MRN: [id number] DOB: 08/05/1933 ? ?Patricia Moore is a 88 y.o. year old female who is a primary care patient of Wanda Plump, MD. I reached out to Lynford Humphrey by phone today in response to a referral sent by Ms. Rhina M Gullikson's primary care provider. ? ?An unsuccessful telephone outreach was attempted today. The patient was referred to the case management team for assistance with care management and care coordination.  ? ?Follow Up Plan: A HIPAA compliant phone message was left for the patient providing contact information and requesting a return call.  ? ?Sindy Mccune, CCMA ?Care Guide, Embedded Care Coordination ?[facility]  ?Direct Dial: [phone] ? ? ?

## 2021-07-12 NOTE — Chronic Care Management (AMB) (Signed)
?  Chronic Care Management  ? ?Outreach Note ? ?07/12/2021 ?Name: Patricia Moore MRN: [id number] DOB: 1933/08/21 ? ?Patricia Moore is a 88 y.o. year old female who is a primary care patient of Wanda Plump, MD. I reached out to Patricia Moore by phone today in response to a referral sent by Ms. Skilynn M Grippi's primary care provider. ? ?A second unsuccessful telephone outreach was attempted today. The patient was referred to the case management team for assistance with care management and care coordination.  ? ?Follow Up Plan: A HIPAA compliant phone message was left for the patient providing contact information and requesting a return call.  ? ?Siani Utke, CCMA ?Care Guide, Embedded Care Coordination ?[facility]  Care Management  ?Direct Dial: [phone] ? ? ?

## 2021-07-13 DIAGNOSIS — L820 Inflamed seborrheic keratosis: Secondary | ICD-10-CM | POA: Diagnosis not present

## 2021-07-18 NOTE — Chronic Care Management (AMB) (Signed)
?  Care Management  ? ?Outreach Note ? ?07/18/2021 ?Name: Patricia Moore MRN: [id number] DOB: Aug 26, 1933 ? ?Referred by: Wanda Plump, MD ?Reason for referral : Chronic Care Management (Initial outreach to schedule referral with Pharm D ) ? ? ?Third unsuccessful telephone outreach was attempted today. The patient was referred to the case management team for assistance with care management and care coordination. The patient's primary care provider has been notified of our unsuccessful attempts to make or maintain contact with the patient. The care management team is pleased to engage with this patient at any time in the future should he/she be interested in assistance from the care management team.  ? ?Follow Up Plan:  ?We have been unable to make contact with the patient for follow up. The care management team is available to follow up with the patient after provider conversation with the patient regarding recommendation for care management engagement and subsequent re-referral to the care management team.  ? ?Jaycelynn Knickerbocker, CCMA ?Care Guide, Embedded Care Coordination ?[place]  Care Management  ?Direct Dial: [phone] ? ? ?

## 2021-07-22 ENCOUNTER — Other Ambulatory Visit: Payer: Self-pay | Admitting: Internal Medicine

## 2021-07-24 ENCOUNTER — Other Ambulatory Visit: Payer: Self-pay | Admitting: Internal Medicine
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
## 2021-08-12 ENCOUNTER — Ambulatory Visit: Payer: Medicare Other | Admitting: Neurology

## 2021-08-12 ENCOUNTER — Encounter: Payer: Self-pay | Admitting: Neurology

## 2021-08-12 ENCOUNTER — Other Ambulatory Visit: Payer: Self-pay | Admitting: Internal Medicine

## 2021-08-12 VITALS — BP 155/74 | HR 51 | Ht 61.0 in | Wt 131.0 lb

## 2021-08-12 DIAGNOSIS — M4722 Other spondylosis with radiculopathy, cervical region: Secondary | ICD-10-CM | POA: Diagnosis not present

## 2021-08-12 DIAGNOSIS — E1142 Type 2 diabetes mellitus with diabetic polyneuropathy: Secondary | ICD-10-CM | POA: Diagnosis not present

## 2021-08-12 DIAGNOSIS — I671 Cerebral aneurysm, nonruptured: Secondary | ICD-10-CM | POA: Diagnosis not present

## 2021-08-12 DIAGNOSIS — G4762 Sleep related leg cramps: Secondary | ICD-10-CM | POA: Diagnosis not present

## 2021-09-19 ENCOUNTER — Telehealth: Payer: Self-pay | Admitting: Internal Medicine

## 2021-09-19 DIAGNOSIS — E119 Type 2 diabetes mellitus without complications: Secondary | ICD-10-CM

## 2021-09-19 NOTE — Telephone Encounter (Signed)
Pt has some questions regarding her insulin and the price. She would like to discuss this as soon as possible, please advise.

## 2021-09-19 NOTE — Telephone Encounter (Signed)
I believe you previously helped her w/ getting Ozempic from the manufacturer- do I need to put in a new referral for her to see you again?

## 2021-09-20 NOTE — Telephone Encounter (Signed)
Referral placed to Tammy for help w/ Ozempic Pt assistance.

## 2021-09-21 ENCOUNTER — Ambulatory Visit (INDEPENDENT_AMBULATORY_CARE_PROVIDER_SITE_OTHER): Payer: Medicare Other | Admitting: Family

## 2021-09-21 VITALS — BP 157/44 | HR 71 | Temp 98.4°F | Resp 16 | Wt 130.0 lb

## 2021-09-21 DIAGNOSIS — M545 Low back pain, unspecified: Secondary | ICD-10-CM
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
## 2021-09-22 ENCOUNTER — Other Ambulatory Visit: Payer: Self-pay | Admitting: Internal Medicine

## 2021-09-22 NOTE — Assessment & Plan Note (Signed)
Acute on chronic. We discussed short course of nsaid, she declines. Would prefer to avoid prednisone due to her diabetes. She does not wish to use any medication for her back pain.  I gave her some exercises to do at home.

## 2021-10-20 ENCOUNTER — Encounter: Payer: Self-pay | Admitting: Internal Medicine

## 2021-10-24 ENCOUNTER — Telehealth: Payer: Self-pay | Admitting: Internal Medicine

## 2021-10-24 ENCOUNTER — Other Ambulatory Visit: Payer: Self-pay

## 2021-10-24 ENCOUNTER — Encounter: Payer: Self-pay | Admitting: Physician Assistant

## 2021-10-24 ENCOUNTER — Ambulatory Visit (INDEPENDENT_AMBULATORY_CARE_PROVIDER_SITE_OTHER): Payer: Medicare Other | Admitting: Physician Assistant

## 2021-10-24 VITALS — BP 132/64 | HR 75 | Temp 98.5°F | Ht 61.0 in | Wt 125.8 lb

## 2021-10-24 DIAGNOSIS — K5792 Diverticulitis of intestine, part unspecified, without perforation or abscess without bleeding: Secondary | ICD-10-CM | POA: Diagnosis not present

## 2021-10-24 MED ORDER — AMOXICILLIN-POT CLAVULANATE 875-125 MG PO TABS: 1.0000 | ORAL_TABLET | Freq: Two times a day (BID) | ORAL | 0 refills | Status: DC

## 2021-10-24 MED ORDER — AMOXICILLIN-POT CLAVULANATE 875-125 MG PO TABS: 1.0000 | ORAL_TABLET | Freq: Two times a day (BID) | ORAL | 0 refills | Status: AC

## 2021-10-24 NOTE — Telephone Encounter (Signed)
1.  If no allergy or intolerance prescribe Augmentin 875 mg twice daily for 7 days 2.  Low residue diet.  Advance as tolerated 3.  Schedule her to see me July 19 at 4 PM 4.  If she worsens in the interim, she should seek medical attention at one of the emergency room facilities. Thanks Dr. Henrene Pastor

## 2021-10-24 NOTE — Telephone Encounter (Signed)
Inbound call from patient stating that she is having left abd pain. Patient stated that she tried to get an appointment with her PCP and they did not have any openings and they directed her to call Dr. Blanch Media office. I advised patient that Dr. Pearletha Furl next opening would not be until 8/23. Patient is requesting a call back to discuss her symptoms and what she can do to help. Please advise.

## 2021-10-24 NOTE — Telephone Encounter (Signed)
Pt states she started having LLQ abd pain on Wednesday. She has no fever. States pain has gotten worse over the past couple of days. She thinks she is having a diverticulitis flare. Pt cannot take Cipro. Please advise.

## 2021-10-24 NOTE — Progress Notes (Signed)
Subjective:    Patient ID: Patricia Moore, female    DOB: Apr 11, 1934, 88 y.o.   MRN: [id number]  Chief Complaint  Patient presents with   Diverticulitis    Pt experiencing issues with Diverticulitis and started in pain last week and by Friday got worse. Pt in pt when touching LLQ of abdomen and when walking    HPI Patient is in today for acute diverticulitis flare. Denies any food changes that could have caused this. Symptoms started 10/21/21, severe pain at that time, but lessened since then. Has been doing bland diet.  Pain 3/10 LLQ abdomen currently. No diarrhea. No blood in stool. No fevers, but some chills.  No urinary symptoms.   Last flare up was 11/10/20, took augmentin, symptoms resolved.  Never has been hospitalized for diverticulitis.   Past Medical History:  Diagnosis Date   Anxiety 12/09/2013   Arthritis    Broken bones    face   Cataract    Diabetes mellitus    Diverticulitis    patient states has had three attacks    Diverticulosis    DJD (degenerative joint disease) 12/14/2014   Glaucoma    H. pylori infection 2001   Heart murmur    Hx of adenomatous colonic polyps    Hyperlipidemia    Hypertension    Intraductal papilloma    bx nef x 2 in the 70s-90s   Saccular aneurysm    SCC (squamous cell carcinoma) 03/2011   sees dermatology    Past Surgical History:  Procedure Laterality Date   BREAST BIOPSY  '91, '95   INTRADUCTAL PAPILLOMA   BREAST LUMPECTOMY     CATARACT EXTRACTION Bilateral 09/2014   CLOSED REDUCTION ULNAR SHAFT Right 10/06/2017   Procedure: CLOSED REDUCTION DISLOCATED RIGHT SHOULDER;  Surgeon: Kathryne Hitch, MD;  Location: [facility];  Service: Orthopedics;  Laterality: Right;   COLONOSCOPY      Family History  Adopted: Yes  Problem Relation Age of Onset   Heart disease Mother 55   Heart disease Father 72   Cancer Sister        skin   Breast cancer Maternal Aunt    Stroke Maternal Aunt    Breast cancer Maternal Aunt     Colon cancer Paternal Uncle        7 uncles   Colon cancer Maternal Grandfather        COLON,, FAMILY HX 7 MEMBERS   Rectal cancer Neg Hx    Stomach cancer Neg Hx     Social History   Tobacco Use   Smoking status: Never   Smokeless tobacco: Never  Vaping Use   Vaping Use: Never used  Substance Use Topics   Alcohol use: Never   Drug use: No     Allergies  Allergen Reactions   Ciprofloxacin Nausea And Vomiting    Review of Systems NEGATIVE UNLESS OTHERWISE INDICATED IN HPI      Objective:     BP 132/64 (BP Location: Right Arm)   Pulse 75   Temp 98.5 F (36.9 C) (Temporal)   Ht 5\' 1"  (1.549 m)   Wt 125 lb 12.8 oz (57.1 kg)   LMP 04/17/1988   SpO2 98%   BMI 23.77 kg/m   Wt Readings from Last 3 Encounters:  10/24/21 125 lb 12.8 oz (57.1 kg)  09/21/21 130 lb (59 kg)  08/12/21 131 lb (59.4 kg)    BP Readings from Last 3 Encounters:  10/24/21 132/64  09/21/21 Marland Kitchen)  157/44  08/12/21 (!) 155/74     Physical Exam Vitals and nursing note reviewed.  Constitutional:      Appearance: Normal appearance.  Cardiovascular:     Rate and Rhythm: Normal rate and regular rhythm.     Heart sounds: Murmur heard.  Pulmonary:     Effort: Pulmonary effort is normal.     Breath sounds: Normal breath sounds.  Abdominal:     General: There is no distension.     Tenderness: There is abdominal tenderness in the suprapubic area and left lower quadrant. There is no right CVA tenderness or left CVA tenderness.     Hernia: No hernia is present.  Neurological:     Mental Status: She is alert.  Psychiatric:        Mood and Affect: Mood normal.        Behavior: Behavior normal.        Assessment & Plan:   Problem List Items Addressed This Visit   None Visit Diagnoses     Acute diverticulitis    -  Primary   Relevant Medications   amoxicillin-clavulanate (AUGMENTIN) 875-125 MG tablet        Meds ordered this encounter  Medications   amoxicillin-clavulanate  (AUGMENTIN) 875-125 MG tablet    Sig: Take 1 tablet by mouth 2 (two) times daily for 7 days.    Dispense:  14 tablet    Refill:  0    Order Specific Question:   Supervising Provider    Answer:   Durene Cal, STEPHEN O [[id number]]   1. Acute diverticulitis -No red flags, non-toxic appearing, VS are stable -She has done well with Augmentin in the past, start this again -Soft, bland diet, push fluids -ER if severe pain, fevers, or blood in stool develop; pt understanding and agreeable   This note was prepared with assistance of Dragon voice recognition software. Occasional wrong-word or sound-a-like substitutions may have occurred due to the inherent limitations of voice recognition software.    Savreen Gebhardt M Shaine Newmark, PA-C

## 2021-10-24 NOTE — Telephone Encounter (Signed)
Pt aware of Dr. Blanch Media recommendations. Script sent to pharmacy and appt moved to 11/02/21 at 4pm. Pt aware.

## 2021-11-02 ENCOUNTER — Encounter: Payer: Self-pay | Admitting: Internal Medicine

## 2021-11-02 ENCOUNTER — Ambulatory Visit: Payer: Medicare Other | Admitting: Internal Medicine

## 2021-11-02 VITALS — BP 150/76 | HR 80 | Ht 60.5 in | Wt 128.0 lb

## 2021-11-02 DIAGNOSIS — K5790 Diverticulosis of intestine, part unspecified, without perforation or abscess without bleeding: Secondary | ICD-10-CM

## 2021-11-02 NOTE — Patient Instructions (Signed)
If you are age 86 or older, your body mass index should be between 23-30. Your Body mass index is 24.59 kg/m. If this is out of the aforementioned range listed, please consider follow up with your Primary Care Provider.  If you are age 66 or younger, your body mass index should be between 19-25. Your Body mass index is 24.59 kg/m. If this is out of the aformentioned range listed, please consider follow up with your Primary Care Provider.   ________________________________________________________  The [facility] providers would like to encourage you to use [facility] to communicate with providers for non-urgent requests or questions.  Due to long hold times on the telephone, sending your provider a message by [facility] may be a faster and more efficient way to get a response.  Please allow 48 business hours for a response.  Please remember that this is for non-urgent requests.  _______________________________________________________  Please follow up as needed

## 2021-11-02 NOTE — Progress Notes (Signed)
HISTORY OF PRESENT ILLNESS:  Patricia Moore is a 88 y.o. female, [place] native, with a history of recurrent diverticulitis and a family history of colon cancer for which she is undergone multiple previous colonoscopies.  Patient was last seen in this office November 2020.  See that dictation.  Her last colonoscopy was December 2020.  Diminutive polyps and diverticulosis.  Patient contacted the office October 24, 2021 complaining of a typical diverticular flare.  Following was recommended:  1.  If no allergy or intolerance prescribe Augmentin 875 mg twice daily for 7 days 2.  Low residue diet.  Advance as tolerated 3.  Schedule her to see me July 19 at 4 PM 4.  If she worsens in the interim, she should seek medical attention at one of the emergency room facilities.  She tells me that after 3 days her pain resolved.  She is now eating a normal diet with good bowel movements.  No other complaints.  She is grateful.   REVIEW OF SYSTEMS:  All non-GI ROS negative.  Past Medical History:  Diagnosis Date   Anxiety 12/09/2013   Arthritis    Broken bones    face   Cataract    Diabetes mellitus    Diverticulitis    patient states has had three attacks    Diverticulosis    DJD (degenerative joint disease) 12/14/2014   Glaucoma    H. pylori infection 2001   Heart murmur    Hx of adenomatous colonic polyps    Hyperlipidemia    Hypertension    Intraductal papilloma    bx nef x 2 in the 70s-90s   Saccular aneurysm    SCC (squamous cell carcinoma) 03/2011   sees dermatology    Past Surgical History:  Procedure Laterality Date   BREAST BIOPSY  '91, '95   INTRADUCTAL PAPILLOMA   BREAST LUMPECTOMY     CATARACT EXTRACTION Bilateral 09/2014   CLOSED REDUCTION ULNAR SHAFT Right 10/06/2017   Procedure: CLOSED REDUCTION DISLOCATED RIGHT SHOULDER;  Surgeon: Mcarthur Rossetti, MD;  Location: [facility];  Service: Orthopedics;  Laterality: Right;   COLONOSCOPY      Social History Patricia Moore   reports that she has never smoked. She has never used smokeless tobacco. She reports that she does not drink alcohol and does not use drugs.  family history includes Breast cancer in her maternal aunt and maternal aunt; Colon cancer in her maternal grandfather and paternal uncle; Heart disease (age of onset: 58) in her mother; Heart disease (age of onset: 68) in her father; Skin cancer in her sister; Stroke in her maternal aunt. She was adopted.  Allergies  Allergen Reactions   Ciprofloxacin Nausea And Vomiting       PHYSICAL EXAMINATION: Vital signs: BP (!) 150/76 (BP Location: Left Arm, Patient Position: Sitting, Cuff Size: Normal)   Pulse 80   Ht 5' 0.5" (1.537 m) Comment: height measured without shoes  Wt 128 lb (58.1 kg)   LMP 04/17/1988   BMI 24.59 kg/m   Constitutional: generally well-appearing, no acute distress Psychiatric: alert and oriented x3, cooperative Eyes: extraocular movements intact, anicteric, conjunctiva pink Mouth: oral pharynx moist, no lesions Neck: supple no lymphadenopathy Cardiovascular: heart regular rate and rhythm, no murmur Lungs: clear to auscultation bilaterally Abdomen: soft, nontender, nondistended, no obvious ascites, no peritoneal signs, normal bowel sounds, no organomegaly Rectal: Omitted Extremities: no clubbing, cyanosis, or lower extremity edema bilaterally Skin: no lesions on visible extremities Neuro: No focal deficits.  Cranial nerves intact  ASSESSMENT:  1.  Acute diverticulitis flare.  Has resolved with antibiotic therapy. 2.  Personal history of colon polyps and family history of colon cancer.  Last colonoscopy December 2020.   PLAN:  1.  High-fiber diet 2.  Contact this office for recurrent issues or questions. 3.  Has aged out of routine surveillance colonoscopy 4.  Ongoing general medical care with Dr. Larose Kells.  GI follow-up as needed

## 2021-11-08 ENCOUNTER — Ambulatory Visit: Payer: Medicare Other | Admitting: Internal Medicine

## 2021-11-25 ENCOUNTER — Other Ambulatory Visit (HOSPITAL_BASED_OUTPATIENT_CLINIC_OR_DEPARTMENT_OTHER): Payer: Self-pay

## 2021-11-25 ENCOUNTER — Encounter: Payer: Self-pay | Admitting: Internal Medicine

## 2021-11-25 ENCOUNTER — Ambulatory Visit (INDEPENDENT_AMBULATORY_CARE_PROVIDER_SITE_OTHER): Payer: Medicare Other | Admitting: Internal Medicine

## 2021-11-25 VITALS — BP 140/70 | HR 89 | Temp 97.9°F | Ht 62.0 in | Wt 125.6 lb

## 2021-11-25 DIAGNOSIS — Z0000 Encounter for general adult medical examination without abnormal findings: Secondary | ICD-10-CM

## 2021-11-25 DIAGNOSIS — E119 Type 2 diabetes mellitus without complications: Secondary | ICD-10-CM

## 2021-11-25 DIAGNOSIS — I10 Essential (primary) hypertension: Secondary | ICD-10-CM | POA: Diagnosis not present

## 2021-11-25 LAB — BASIC METABOLIC PANEL
BUN: 24 mg/dL — ABNORMAL HIGH (ref 6–23)
CO2: 32 mEq/L (ref 19–32)
Calcium: 10.6 mg/dL — ABNORMAL HIGH (ref 8.4–10.5)
Chloride: 96 mEq/L (ref 96–112)
Creatinine, Ser: 0.86 mg/dL (ref 0.40–1.20)
GFR: 61.4 mL/min (ref >60.00)
Glucose, Bld: 83 mg/dL (ref 70–99)
Potassium: 5.3 mEq/L — ABNORMAL HIGH (ref 3.5–5.1)
Sodium: 136 mEq/L (ref 135–145)

## 2021-11-25 LAB — CBC
HCT: 40.1 % (ref 36.0–46.0)
Hemoglobin: 13.4 g/dL (ref 12.0–15.0)
MCHC: 33.3 g/dL (ref 30.0–36.0)
MCV: 92.3 fl (ref 78.0–100.0)
Platelets: 343 10*3/uL (ref 150.0–400.0)
RBC: 4.34 Mil/uL (ref 3.87–5.11)
RDW: 12.7 % (ref 11.5–15.5)
WBC: 8.6 10*3/uL (ref 4.0–10.5)

## 2021-11-25 LAB — HEMOGLOBIN A1C: Hgb A1c MFr Bld: 6 % (ref 4.6–6.5)

## 2021-11-25 LAB — TSH: TSH: 0.91 u[IU]/mL (ref 0.35–5.50)

## 2021-11-25 MED ORDER — SEMAGLUTIDE(0.25 OR 0.5MG/DOS) 2 MG/3ML ~~LOC~~ SOPN
PEN_INJECTOR | SUBCUTANEOUS | 3 refills | Status: DC
  Filled 2021-11-25: qty 3, 28d supply, fill #0

## 2021-11-25 NOTE — Progress Notes (Signed)
Subjective:    Patient ID: Patricia Moore, female    DOB: 21-Jul-1933, 88 y.o.   MRN: [id number]  DOS:  11/25/2021 Type of visit - description: CPX  Here for CPX Since the last office visit is feeling well, "this is the best I have feel in  years".  Review of Systems  Other than above, a 14 point review of systems is negative   Past Medical History:  Diagnosis Date   Anxiety 12/09/2013   Arthritis    Broken bones    face   Cataract    Diabetes mellitus    Diverticulitis    patient states has had three attacks    Diverticulosis    DJD (degenerative joint disease) 12/14/2014   Glaucoma    H. pylori infection 2001   Heart murmur    Hx of adenomatous colonic polyps    Hyperlipidemia    Hypertension    Intraductal papilloma    bx nef x 2 in the 70s-90s   Saccular aneurysm    SCC (squamous cell carcinoma) 03/2011   sees dermatology    Past Surgical History:  Procedure Laterality Date   BREAST BIOPSY  '91, '95   INTRADUCTAL PAPILLOMA   BREAST LUMPECTOMY     CATARACT EXTRACTION Bilateral 09/2014   CLOSED REDUCTION ULNAR SHAFT Right 10/06/2017   Procedure: CLOSED REDUCTION DISLOCATED RIGHT SHOULDER;  Surgeon: Kathryne Hitch, MD;  Location: [facility] OR;  Service: Orthopedics;  Laterality: Right;   COLONOSCOPY     Social History   Socioeconomic History   Marital status: Married    Spouse name: Not on file   Number of children: 1   Years of education: Not on file   Highest education level: Not on file  Occupational History   Occupation: retired     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 Buyer, retail   Son lives in [place])    Social Determinants of Health   Financial Resource Strain: Low Risk  (09/09/2019)   Overall Financial Resource Strain (CARDIA)    Difficulty of Paying Living Expenses: Not hard at all  Food Insecurity: No Food Insecurity (09/09/2019)   Hunger Vital Sign    Worried About Running Out of Food in the Last Year: Never true    Ran Out of Food in the Last Year: Never true  Transportation Needs: No Transportation Needs (09/09/2019)   PRAPARE - Administrator, Civil Service (Medical): No    Lack of Transportation (Non-Medical): No  Physical Activity: Not on file  Stress: Not on file  Social Connections: Not on file  Intimate Partner Violence: Not on file     Current Outpatient Medications  Medication Instructions   ALPRAZolam (XANAX) 0.25 MG tablet TAKE 1 TABLET(0.25 MG) BY MOUTH DAILY AS NEEDED FOR ANXIETY   carvedilol (COREG) 25 MG tablet TAKE 1 TABLET(25 MG) BY MOUTH TWICE DAILY WITH A MEAL  cholecalciferol (VITAMIN D) 1,000 Units, Oral, Daily   co-enzyme Q-10 30 mg, Oral, Daily   cyanocobalamin (VITAMIN B12) 250 mcg, Oral, Daily   dorzolamide-timolol (COSOPT) 22.3-6.8 MG/ML ophthalmic solution 1 drop, Both Eyes, 2 times daily   Lancets (ONETOUCH ULTRASOFT) lancets TEST BLOOD SUGAR NO MORE THEN TWICE DAILY   losartan-hydrochlorothiazide (HYZAAR) 100-12.5 MG tablet Oral   metFORMIN (GLUCOPHAGE) 1000 MG tablet TAKE 1 TABLET BY MOUTH WITH BREAKFAST, 1 TABLET WITH LUNCH THEN 1/2 TABLET AT BEDTIME   ONETOUCH ULTRA test strip CHECK BLOOD SUGAR NO MORE THAN 2 TIMES PER DAY   RESTASIS 0.05 % ophthalmic emulsion No dose, route, or frequency recorded.   Semaglutide,0.25 or 0.5MG /DOS, 2 MG/3ML SOPN INJECT 0.5 UNDER THE SKIN ONCE A WEEK       Objective:   Physical Exam BP (!) 140/70   Pulse 89   Temp 97.9 F (36.6 C) (Oral)   Ht 5\' 2"  (1.575 m)   Wt 125 lb 9.6 oz (57 kg)    LMP 04/17/1988   SpO2 98%   BMI 22.97 kg/m  General: Well developed, NAD, BMI noted Neck: No  thyromegaly  HEENT:  Normocephalic . Face symmetric, atraumatic Lungs:  CTA B Normal respiratory effort, no intercostal retractions, no accessory muscle use. Heart: RRR,  no murmur.  Abdomen:  Not distended, soft, non-tender. No rebound or rigidity.   Lower extremities: no pretibial edema bilaterally  Skin: Exposed areas without rash. Not pale. Not jaundice Neurologic:  alert & oriented X3.  Speech normal, gait appropriate for age and unassisted Strength symmetric and appropriate for age.  Psych: Cognition and judgment appear intact.  Cooperative with normal attention span and concentration.  Behavior appropriate. No anxious or depressed appearing.     Assessment     Assessment  DM HTN -Losartan d/c d/t hyperkalemia -Amlodipine d/c d/t edema 07-2017 - HCTZ d/c d/t hyperkalemia see note 08-27-17 Hyperlipidemia -- intolerant to lipitor and crestor (severe cramps).  Declined further trial with medications  on 01/16/2018   Anxiety- xanax rx by pcp Vertigo - valium prn GI: --Recurrent diverticulitis (one episode documented by a CT 11-2014) --H. pylori 2001 DJD--back pain, knees, neck pain ( on baclofen for neck) Osteoporosis : Tscore -1.9 on 11-2015 , T score  -1.8 (03/2019); h/o  R ulnar FX 2019 SCC, skin cancer Elevated homocysteine Raynaud's phenomena  NEURO --Carotid US 10-2013: No significant plaque, although ICA's are serpentine, bilaterally. 40-59% bilateral ICA stenosis by velocity criteria likely due to tortuosity. Patent vertebral arteries with antegrade flow.Normal subclavian arteries, bilaterally. 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  MRA head 08/2019 stable, recheck 5 years per neuro --Admitted 07-2015, dizziness, MRI negative, DX labyrinthitis   PLAN: Here for CPX DM: Currently on metformin and Ozempic.  Reports ambulatory CBG around 100.  Check labs. HTN: BP was slightly elevated when she arrived to the office, recheck 140/70.  Recommend to check at home, continue carvedilol, Hyzaar.  Checking labs Hyperlipidemia: Intolerant to statins, declines further medications. DJD: Neck pain well controlled with topical Voltaren as needed Elevated homocystine: Continue vitamins. History of diverticulosis: Recently had diverticulitis, sxs largely resolved. RTC 6 months

## 2021-11-25 NOTE — Assessment & Plan Note (Signed)
Here for CPX DM: Currently on metformin and Ozempic.  Reports ambulatory CBG around 100.  Check labs. HTN: BP was slightly elevated when she arrived to the office, recheck 140/70.  Recommend to check at home, continue carvedilol, Hyzaar.  Checking labs Hyperlipidemia: Intolerant to statins, declines further medications. DJD: Neck pain well controlled with topical Voltaren as needed Elevated homocystine: Continue vitamins. History of diverticulosis: Recently had diverticulitis, sxs largely resolved. RTC 6 months

## 2021-11-25 NOTE — Patient Instructions (Signed)
Vaccines I recommend: Shingrix COVID booster Flu shot every fall   Check the  blood pressure regularly BP GOAL is between 110/65 and  135/85. If it is consistently higher or lower, let me know   GO TO THE LAB : Get the blood work     [place], [place] back for a checkup in 6 months    "Living will", "[place] of attorney": Advanced care planning  (If you already have a living will or healthcare power of attorney, please bring the copy to be scanned in your chart.)  Advance care planning is a process that supports adults in  understanding and sharing their preferences regarding future medical care.   The patient's preferences are recorded in documents called Advance Directives.    Advanced directives are completed (and can be modified at any time) while the patient is in full mental capacity.   The documentation should be available at all times to the patient, the family and the healthcare providers.  Bring in a copy to be scanned in your chart is an excellent idea and is recommended   This legal documents direct treatment decision making and/or appoint a surrogate to make the decision if the patient is not capable to do so.    Advance directives can be documented in many types of formats,  documents have names such as:  Lliving will  Durable power of attorney for healthcare (healthcare proxy or healthcare power of attorney)  Combined directives  Physician orders for life-sustaining treatment    More information at:  [email or web address]

## 2021-11-25 NOTE — Assessment & Plan Note (Signed)
-   Td 2019 - PNM 23:  2014 had s/e - Prevnar:01-2015 - PNM: declines (strongly) -zostavax: 2011.   -Shingrix discussed -COVID booster: Benefits discussed - Recommend flu shot every fall CCS: -CCS:  colonoscopy 10-2013, + polyps, colonoscopy 04/02/2019, + polyps, diverticulosis, No follow-up recommended - Female care :  last gyn OV 02/2019;  h/o fibroid tumor, no treatment necessary unless she is symptomatic. MMG 12-2020 negative -Counseled: Diet and exercise. -Labs: BMP, CBC, A1c, TSH -POA discussed

## 2021-11-29 ENCOUNTER — Encounter: Payer: Self-pay | Admitting: Internal Medicine

## 2021-11-29 DIAGNOSIS — H409 Unspecified glaucoma: Secondary | ICD-10-CM | POA: Insufficient documentation

## 2021-11-29 NOTE — Addendum Note (Signed)
Addended byDamita Dunnings D on: 11/29/2021 01:44 PM   Modules accepted: Orders

## 2021-12-21 DIAGNOSIS — B349 Viral infection, unspecified: Secondary | ICD-10-CM | POA: Diagnosis not present

## 2021-12-23 ENCOUNTER — Ambulatory Visit (INDEPENDENT_AMBULATORY_CARE_PROVIDER_SITE_OTHER): Payer: Medicare Other | Admitting: Family Medicine

## 2021-12-23 VITALS — BP 135/70 | HR 77 | Temp 98.1°F | Ht 62.0 in | Wt 125.4 lb

## 2021-12-23 DIAGNOSIS — J309 Allergic rhinitis, unspecified: Secondary | ICD-10-CM | POA: Diagnosis not present

## 2021-12-23 DIAGNOSIS — I10 Essential (primary) hypertension: Secondary | ICD-10-CM

## 2021-12-23 MED ORDER — AZITHROMYCIN 250 MG PO TABS: ORAL_TABLET | ORAL | 0 refills | Status: DC

## 2021-12-23 MED ORDER — AZELASTINE HCL 0.1 % NA SOLN: 2.0000 | Freq: Two times a day (BID) | NASAL | 12 refills | Status: DC

## 2021-12-23 NOTE — Progress Notes (Signed)
   Patricia Moore is a 88 y.o. female who presents today for an office visit.  Assessment/Plan:  New/Acute Problems: Sinusitis  No red flags.  COVID and flu test both negative.  We will start Astelin nasal spray.  Encouraged hydration.  Sent in [employer] scription for azithromycin with instruction not start unless symptoms fail to improve over the next several days.  We discussed reasons to return to care.  Follow-up as needed.  Chronic Problems Addressed Today: Hypertension At goal today on losartan-HCTZ 100-12 0.5 once daily  Allergic Rhinitis Likely contributing to above.  We will be adding on Astelin as above.  She can continue her over-the-counter allergy meds otherwise.    Subjective:  HPI:  Patient here with cough, myalgias, rhinorrhea, sore throat, and malaise.  This started 5 days ago. She recently had house guests staying at her house last week and when they went back home they tested positive for COVID.  No fevers or chills.  Symptoms seem to be worsening. No treatments tried. No chest Moore or SOB. She went to urgent care a few days ago and had a negative test for COVID.        Objective:  Physical Exam: BP 135/70   Pulse 77   Temp 98.1 F (36.7 C) (Temporal)   Ht '5\' 2"'$  (1.575 m)   Wt 125 lb 6.4 oz (56.9 kg)   LMP 04/17/1988   SpO2 99%   BMI 22.94 kg/m   Gen: No acute distress, resting comfortably HEENT:  CV: Regular rate and rhythm with no murmurs appreciated Pulm: Normal work of breathing, clear to auscultation bilaterally with no crackles, wheezes, or rhonchi Neuro: Grossly normal, moves all extremities Psych: Normal affect and thought content      Patricia Stannard M. Jerline Pain, MD 12/23/2021 11:00 AM

## 2021-12-23 NOTE — Patient Instructions (Signed)
It was very nice to see you today!  Please start the nasal spray.  Start the antibiotic if not improving in a few days.   Let me know if not improving.   Take care, Dr Jerline Pain  PLEASE NOTE:  If you had any lab tests please let us know if you have not heard back within a few days. You may see your results on mychart before we have a chance to review them but we will give you a call once they are reviewed by [place]. If we ordered any referrals today, please let us know if you have not heard from their office within the next week.   Please try these tips to maintain a healthy lifestyle:  Eat at least 3 REAL meals and 1-2 snacks per day.  Aim for no more than 5 hours between eating.  If you eat breakfast, please do so within one hour of getting up.   Each meal should contain half fruits/vegetables, one quarter protein, and one quarter carbs (no bigger than a computer mouse)  Cut down on sweet beverages. This includes juice, soda, and sweet tea.   Drink at least 1 glass of water with each meal and aim for at least 8 glasses per day  Exercise at least 150 minutes every week.

## 2022-01-18 DIAGNOSIS — H526 Other disorders of refraction: Secondary | ICD-10-CM | POA: Diagnosis not present

## 2022-01-18 DIAGNOSIS — H04123 Dry eye syndrome of bilateral lacrimal glands: Secondary | ICD-10-CM | POA: Diagnosis not present

## 2022-01-18 DIAGNOSIS — E119 Type 2 diabetes mellitus without complications: Secondary | ICD-10-CM | POA: Diagnosis not present

## 2022-01-18 DIAGNOSIS — H401131 Primary open-angle glaucoma, bilateral, mild stage: Secondary | ICD-10-CM | POA: Diagnosis not present

## 2022-01-20 ENCOUNTER — Encounter: Payer: Self-pay | Admitting: Family Medicine

## 2022-01-20 ENCOUNTER — Ambulatory Visit (INDEPENDENT_AMBULATORY_CARE_PROVIDER_SITE_OTHER): Payer: Medicare Other

## 2022-01-20 ENCOUNTER — Ambulatory Visit (INDEPENDENT_AMBULATORY_CARE_PROVIDER_SITE_OTHER): Payer: Medicare Other | Admitting: Family Medicine

## 2022-01-20 VITALS — BP 145/78 | HR 54 | Temp 98.4°F | Ht 61.0 in | Wt 126.0 lb

## 2022-01-20 DIAGNOSIS — M542 Cervicalgia: Secondary | ICD-10-CM

## 2022-01-20 DIAGNOSIS — W19XXXA Unspecified fall, initial encounter: Secondary | ICD-10-CM | POA: Diagnosis not present

## 2022-01-20 NOTE — Patient Instructions (Addendum)
Heat (pad or rice pillow in microwave) over affected area, 10-15 minutes twice daily.   Ice/cold pack over area for 10-15 min twice daily.  OK to take Tylenol 1000 mg (2 extra strength tabs) or 975 mg (3 regular strength tabs) every 6 hours as needed.  [facility] [address], [place], [place] [address]  [phone]  Let us know if you need anything.  EXERCISES RANGE OF MOTION (ROM) AND STRETCHING EXERCISES  These exercises may help you when beginning to rehabilitate your issue. In order to successfully resolve your symptoms, you must improve your posture. These exercises are designed to help reduce the forward-head and rounded-shoulder posture which contributes to this condition. Your symptoms may resolve with or without further involvement from your physician, physical therapist or athletic trainer. While completing these exercises, remember:  Restoring tissue flexibility helps normal motion to return to the joints. This allows healthier, less painful movement and activity. An effective stretch should be held for at least 20 seconds, although you may need to begin with shorter hold times for comfort. A stretch should never be painful. You should only feel a gentle lengthening or release in the stretched tissue. Do not do any stretch or exercise that you cannot tolerate.  STRETCH- Axial Extensors Lie on your back on the floor. You may bend your knees for comfort. Place a rolled-up hand towel or dish towel, about 2 inches in diameter, under the part of your head that makes contact with the floor. Gently tuck your chin, as if trying to make a "double chin," until you feel a gentle stretch at the base of your head. Hold 15-20 seconds. Repeat 2-3 times. Complete this exercise 1 time per day.   STRETCH - Axial Extension  Stand or sit on a firm surface. Assume a good posture: chest up, shoulders drawn back, abdominal muscles slightly tense, knees unlocked (if standing) and  feet hip width apart. Slowly retract your chin so your head slides back and your chin slightly lowers. Continue to look straight ahead. You should feel a gentle stretch in the back of your head. Be certain not to feel an aggressive stretch since this can cause headaches later. Hold for 15-20 seconds. Repeat 2-3 times. Complete this exercise 1 time per day.  STRETCH - Cervical Side Bend  Stand or sit on a firm surface. Assume a good posture: chest up, shoulders drawn back, abdominal muscles slightly tense, knees unlocked (if standing) and feet hip width apart. Without letting your nose or shoulders move, slowly tip your right / left ear to your shoulder until your feel a gentle stretch in the muscles on the opposite side of your neck. Hold 15-20 seconds. Repeat 2-3 times. Complete this exercise 1-2 times per day.  STRETCH - Cervical Rotators  Stand or sit on a firm surface. Assume a good posture: chest up, shoulders drawn back, abdominal muscles slightly tense, knees unlocked (if standing) and feet hip width apart. Keeping your eyes level with the ground, slowly turn your head until you feel a gentle stretch along the back and opposite side of your neck. Hold 15-20 seconds. Repeat 2-3 times. Complete this exercise 1-2 times per day.  RANGE OF MOTION - Neck Circles  Stand or sit on a firm surface. Assume a good posture: chest up, shoulders drawn back, abdominal muscles slightly tense, knees unlocked (if standing) and feet hip width apart. Gently roll your head down and around from the back of one shoulder to the back  of the other. The motion should never be forced or painful. Repeat the motion 10-20 times, or until you feel the neck muscles relax and loosen. Repeat 2-3 times. Complete the exercise 1-2 times per day. STRENGTHENING EXERCISES - Cervical Strain and Sprain These exercises may help you when beginning to rehabilitate your injury. They may resolve your symptoms with or without further  involvement from your physician, physical therapist, or athletic trainer. While completing these exercises, remember:  Muscles can gain both the endurance and the strength needed for everyday activities through controlled exercises. Complete these exercises as instructed by your physician, physical therapist, or athletic trainer. Progress the resistance and repetitions only as guided. You may experience muscle soreness or fatigue, but the pain or discomfort you are trying to eliminate should never worsen during these exercises. If this pain does worsen, stop and make certain you are following the directions exactly. If the pain is still present after adjustments, discontinue the exercise until you can discuss the trouble with your clinician.  STRENGTH - Cervical Flexors, Isometric Face a wall, standing about 6 inches away. Place a small pillow, a ball about 6-8 inches in diameter, or a folded towel between your forehead and the wall. Slightly tuck your chin and gently push your forehead into the soft object. Push only with mild to moderate intensity, building up tension gradually. Keep your jaw and forehead relaxed. Hold 10 to 20 seconds. Keep your breathing relaxed. Release the tension slowly. Relax your neck muscles completely before you start the next repetition. Repeat 2-3 times. Complete this exercise 1 time per day.  STRENGTH- Cervical Lateral Flexors, Isometric  Stand about 6 inches away from a wall. Place a small pillow, a ball about 6-8 inches in diameter, or a folded towel between the side of your head and the wall. Slightly tuck your chin and gently tilt your head into the soft object. Push only with mild to moderate intensity, building up tension gradually. Keep your jaw and forehead relaxed. Hold 10 to 20 seconds. Keep your breathing relaxed. Release the tension slowly. Relax your neck muscles completely before you start the next repetition. Repeat 2-3 times. Complete this exercise 1  time per day.  STRENGTH - Cervical Extensors, Isometric  Stand about 6 inches away from a wall. Place a small pillow, a ball about 6-8 inches in diameter, or a folded towel between the back of your head and the wall. Slightly tuck your chin and gently tilt your head back into the soft object. Push only with mild to moderate intensity, building up tension gradually. Keep your jaw and forehead relaxed. Hold 10 to 20 seconds. Keep your breathing relaxed. Release the tension slowly. Relax your neck muscles completely before you start the next repetition. Repeat 2-3 times. Complete this exercise 1 time per day.  POSTURE AND BODY MECHANICS CONSIDERATIONS Keeping correct posture when sitting, standing or completing your activities will reduce the stress put on different body tissues, allowing injured tissues a chance to heal and limiting painful experiences. The following are general guidelines for improved posture. Your physician or physical therapist will provide you with any instructions specific to your needs. While reading these guidelines, remember: The exercises prescribed by your provider will help you have the flexibility and strength to maintain correct postures. The correct posture provides the optimal environment for your joints to work. All of your joints have less wear and tear when properly supported by a spine with good posture. This means you will experience a healthier,  less painful body. Correct posture must be practiced with all of your activities, especially prolonged sitting and standing. Correct posture is as important when doing repetitive low-stress activities (typing) as it is when doing a single heavy-load activity (lifting).  PROLONGED STANDING WHILE SLIGHTLY LEANING FORWARD When completing a task that requires you to lean forward while standing in one place for a long time, place either foot up on a stationary 2- to 4-inch high object to help maintain the best posture. When both  feet are on the ground, the low back tends to lose its slight inward curve. If this curve flattens (or becomes too large), then the back and your other joints will experience too much stress, fatigue more quickly, and can cause pain.   RESTING POSITIONS Consider which positions are most painful for you when choosing a resting position. If you have pain with flexion-based activities (sitting, bending, stooping, squatting), choose a position that allows you to rest in a less flexed posture. You would want to avoid curling into a fetal position on your side. If your pain worsens with extension-based activities (prolonged standing, working overhead), avoid resting in an extended position such as sleeping on your stomach. Most people will find more comfort when they rest with their spine in a more neutral position, neither too rounded nor too arched. Lying on a non-sagging bed on your side with a pillow between your knees, or on your back with a pillow under your knees will often provide some relief. Keep in mind, being in any one position for a prolonged period of time, no matter how correct your posture, can still lead to stiffness.  WALKING Walk with an upright posture. Your ears, shoulders, and hips should all line up. OFFICE WORK When working at a desk, create an environment that supports good, upright posture. Without extra support, muscles fatigue and lead to excessive strain on joints and other tissues.  CHAIR: A chair should be able to slide under your desk when your back makes contact with the back of the chair. This allows you to work closely. The chair's height should allow your eyes to be level with the upper part of your monitor and your hands to be slightly lower than your elbows. Body position: Your feet should make contact with the floor. If this is not possible, use a foot rest. Keep your ears over your shoulders. This will reduce stress on your neck and low back.

## 2022-01-20 NOTE — Progress Notes (Signed)
Musculoskeletal Exam  Patient: Patricia Moore DOB: 05/21/1933  DOS: 01/20/2022  SUBJECTIVE:  Chief Complaint:   Chief Complaint  Patient presents with   Patricia Moore is a 88 y.o.  female for evaluation and treatment of neck pain.   Onset:  10  hrs  ago. Slipped on the floor and hit her head and scraped her R forearm Location: b/l posterior neck Character:  aching  Progression of issue:  is unchanged Associated symptoms: decreased ROM 2/2 pain No bruising, redness, swelling Treatment: to date has been ice and OTC NSAIDS.   Neurovascular symptoms: no  Past Medical History:  Diagnosis Date   Anxiety 12/09/2013   Arthritis    Broken bones    face   Cataract    Diabetes mellitus    Diverticulitis    patient states has had three attacks    Diverticulosis    DJD (degenerative joint disease) 12/14/2014   Glaucoma    H. pylori infection 2001   Heart murmur    Hx of adenomatous colonic polyps    Hyperlipidemia    Hypertension    Intraductal papilloma    bx nef x 2 in the 70s-90s   Saccular aneurysm    SCC (squamous cell carcinoma) 03/2011   sees dermatology    Objective: VITAL SIGNS: BP (!) 145/78 (BP Location: Left Arm, Patient Position: Sitting, Cuff Size: Small)   Pulse (!) 54   Temp 98.4 F (36.9 C) (Oral)   Ht '5\' 1"'$  (1.549 m)   Wt 126 lb (57.2 kg)   LMP 04/17/1988   SpO2 99%   BMI 23.81 kg/m  Constitutional: Well formed, well developed. No acute distress. Thorax & Lungs: No accessory muscle use Musculoskeletal: neck.   Normal active range of motion: no.  Limited rotation and extension Normal passive range of motion: no; Limited rotation and extension Tenderness to palpation: yes over suboccipital triangle and cerv parasp msc; no bony/midline ttp Deformity: no Ecchymosis: no Tests positive: none Tests negative: Spurling's Neurologic: Normal sensory function. No focal deficits noted. DTR's equal and symmetric in UE's. No clonus. 5/5 strength in  UE's b/l Psychiatric: Normal mood. Age appropriate judgment and insight. Alert & oriented x 3.    Assessment:  Neck pain - Plan: DG Cervical Spine Complete  Plan: Stretches/exercises, heat, ice, Tylenol. Will ck XR given age and nature of inj.  F/u prn. The patient voiced understanding and agreement to the plan.   [place], DO 01/20/22  1:46 PM

## 2022-01-23 ENCOUNTER — Telehealth: Payer: Self-pay | Admitting: Internal Medicine

## 2022-01-23 NOTE — Telephone Encounter (Signed)
Pt called stating she is still having issues with her neck and back pain. She was seen by Dr. Nani Ravens on 10.6.23 for this issues and stated that he had advised treatment as icing the area and OTC pain meds. Imaging also showed no dislocations or breaks per [place]. Pt stated she has continued taking tylenol over the weekend and that it has not helped. Pt was adamant about having an appointment with Dr. Larose Kells only and after talking with [facility], was able to get her an appt on 10.10.23 @ 3p. Pt still wanted to make Dr. Larose Kells aware of the situation so he can provide alternatives when she comes to see him.

## 2022-01-24 ENCOUNTER — Ambulatory Visit (INDEPENDENT_AMBULATORY_CARE_PROVIDER_SITE_OTHER): Payer: Medicare Other | Admitting: Internal Medicine

## 2022-01-24 ENCOUNTER — Encounter: Payer: Self-pay | Admitting: Internal Medicine

## 2022-01-24 VITALS — BP 150/92 | HR 88 | Temp 98.0°F | Resp 18 | Ht 61.0 in | Wt 125.8 lb

## 2022-01-24 DIAGNOSIS — M542 Cervicalgia: Secondary | ICD-10-CM

## 2022-01-24 DIAGNOSIS — F419 Anxiety disorder, unspecified: Secondary | ICD-10-CM

## 2022-01-24 MED ORDER — METHOCARBAMOL 500 MG PO TABS: 500.0000 mg | ORAL_TABLET | Freq: Two times a day (BID) | ORAL | 0 refills | Status: DC | PRN

## 2022-01-24 MED ORDER — PREDNISONE 10 MG PO TABS: ORAL_TABLET | ORAL | 0 refills | Status: DC

## 2022-01-24 MED ORDER — ALPRAZOLAM 0.25 MG PO TABS
ORAL_TABLET | ORAL | 1 refills | Status: DC
Start: 2022-01-24 — End: 2022-05-10

## 2022-01-24 NOTE — Patient Instructions (Signed)
Continue using a heating pad  Continue Tylenol  500 mg OTC 2 tabs a day every 8 hours as needed for pain  Start prednisone for few days  Take a muscle relaxant called Robaxin every 12 hours as needed only.  Watch for excessive somnolence.  Call if not gradually better  Call anytime if severe symptoms, headache, nausea, numbness at the arms or legs.

## 2022-01-24 NOTE — Progress Notes (Signed)
Subjective:    Patient ID: Patricia Moore, female    DOB: 1933-09-28, 88 y.o.   MRN: [id number]  DOS:  01/24/2022 Type of visit - description:   Symptoms started 2 weeks ago She was returning to bed from the bathroom and fell. She does not recall details of the fall however had ecchymosis at the right forehead and right arm. Shortly after developed major neck pain, posterior, with some radiation to both trapezial areas.  Seen by Dr. Carmelia Roller 4 days ago: Was recommended conservative treatment with heat, ice, Tylenol.  Cervical x-rays show DJD changes  Is here today because she is not feeling much better. Continue with neck pain, worse with any neck movement. Denies any headache per se, no nausea. No arm or leg numbness.   Review of Systems See above   Past Medical History:  Diagnosis Date   Anxiety 12/09/2013   Arthritis    Broken bones    face   Cataract    Diabetes mellitus    Diverticulitis    patient states has had three attacks    Diverticulosis    DJD (degenerative joint disease) 12/14/2014   Glaucoma    H. pylori infection 2001   Heart murmur    Hx of adenomatous colonic polyps    Hyperlipidemia    Hypertension    Intraductal papilloma    bx nef x 2 in the 70s-90s   Saccular aneurysm    SCC (squamous cell carcinoma) 03/2011   sees dermatology    Past Surgical History:  Procedure Laterality Date   BREAST BIOPSY  '91, '95   INTRADUCTAL PAPILLOMA   BREAST LUMPECTOMY     CATARACT EXTRACTION Bilateral 09/2014   CLOSED REDUCTION ULNAR SHAFT Right 10/06/2017   Procedure: CLOSED REDUCTION DISLOCATED RIGHT SHOULDER;  Surgeon: Kathryne Hitch, MD;  Location: [facility] OR;  Service: Orthopedics;  Laterality: Right;   COLONOSCOPY      Current Outpatient Medications  Medication Instructions   ALPRAZolam (XANAX) 0.25 MG tablet TAKE 1 TABLET(0.25 MG) BY MOUTH DAILY AS NEEDED FOR ANXIETY   azelastine (ASTELIN) 0.1 % nasal spray 2 sprays, Each Nare, 2 times daily    carvedilol (COREG) 25 MG tablet TAKE 1 TABLET(25 MG) BY MOUTH TWICE DAILY WITH A MEAL   cholecalciferol (VITAMIN D) 1,000 Units, Oral, Daily   co-enzyme Q-10 30 mg, Oral, Daily   cyanocobalamin (VITAMIN B12) 250 mcg, Oral, Daily   dorzolamide-timolol (COSOPT) 22.3-6.8 MG/ML ophthalmic solution 1 drop, Both Eyes, 2 times daily   Lancets (ONETOUCH ULTRASOFT) lancets TEST BLOOD SUGAR NO MORE THEN TWICE DAILY   losartan-hydrochlorothiazide (HYZAAR) 100-12.5 MG tablet Oral   metFORMIN (GLUCOPHAGE) 1000 MG tablet TAKE 1 TABLET BY MOUTH WITH BREAKFAST, 1 TABLET WITH LUNCH THEN 1/2 TABLET AT BEDTIME   methocarbamol (ROBAXIN) 500 mg, Oral, 2 times daily PRN   ONETOUCH ULTRA test strip CHECK BLOOD SUGAR NO MORE THAN 2 TIMES PER DAY   predniSONE (DELTASONE) 10 MG tablet 3 tabs x 3 days, 2 tabs x 3 days, 1 tab x 3 days   RESTASIS 0.05 % ophthalmic emulsion No dose, route, or frequency recorded.   Semaglutide,0.25 or 0.5MG /DOS, 2 MG/3ML SOPN INJECT 0.5 UNDER THE SKIN ONCE A WEEK       Objective:   Physical Exam BP (!) 150/92 (BP Location: Left Arm, Patient Position: Sitting, Cuff Size: Normal)   Pulse 88   Temp 98 F (36.7 C) (Oral)   Resp 18   Ht 5\' 1"  (1.549  m)   Wt 125 lb 12.8 oz (57.1 kg)   LMP 04/17/1988   SpO2 99%   BMI 23.77 kg/m  General:   Well developed, NAD, BMI noted. HEENT:  Normocephalic . Face symmetric, atraumatic Neck: No TTP at the cervical spine.  No deformities. Range of motion: Quite limited due to pain. Shoulders, symmetric, R shoulder range of motion decreased (chronic issue) Lungs:  CTA B Normal respiratory effort, no intercostal retractions, no accessory muscle use. Heart: RRR,  no murmur.  Lower extremities: no pretibial edema bilaterally  Skin: Resolving ecchymosis right forehead, right elbow Neurologic:  alert & oriented X3.  Speech normal, gait appropriate for age and unassisted Motor and DTR symmetric Psych--  Cognition and judgment appear intact.   Cooperative with normal attention span and concentration.  Behavior appropriate. No anxious or depressed appearing.      Assessment     Assessment  DM HTN -Losartan d/c d/t hyperkalemia -Amlodipine d/c d/t edema 07-2017 - HCTZ d/c d/t hyperkalemia see note 08-27-17 Hyperlipidemia -- intolerant to lipitor and crestor (severe cramps).  Declined further trial with medications  on 01/16/2018   Anxiety- xanax rx by pcp Vertigo - valium prn GI: --Recurrent diverticulitis (one episode documented by a CT 11-2014) --H. pylori 2001 DJD--back pain, knees, neck pain ( on baclofen for neck) Osteoporosis : Tscore -1.9 on 11-2015 , T score  -1.8 (03/2019); h/o  R ulnar FX 2019 SCC, skin cancer Elevated homocysteine Raynaud's phenomena  NEURO --Carotid US 10-2013: No significant plaque, although ICA's are serpentine, bilaterally. 40-59% bilateral ICA stenosis by velocity criteria likely due to tortuosity. Patent vertebral arteries with antegrade flow.Normal subclavian arteries, bilaterally. -- Transinet dizziness, diplopia sx late 2016 , saw neuro >>> Dx w/ TIA/Stroke on clinical grounds  W/u:  Brain  MRI 03-2015 >> 2 mm saccular aneurysm at the right posterior communicating artery origin, saw  Neurology, Rx to recheck MRI 1 year, ok continue ASA per neuro CTA head, neck 05-14-2015: aneurysm confirmed, otherwise (-) --brain aneurysm see above, saw neuro 2018.  MRA head 08/2019 stable, recheck 5 years per neuro --Admitted 07-2015, dizziness, MRI negative, DX labyrinthitis   PLAN: Neck pain:  Started after a fall about 2 weeks ago, although she does not recall details of the fall she noted ecchymosis on the right forehead and right elbow and she knows she did not loss consciousness.  Neck pain is most likely due to hyperextension of the cervical spine. X-rays was negative, no red flag symptoms, motor and DTR symmetric. Plan: Continue heating pad and Tylenol.  Add a low-dose of prednisone and a muscle  relaxant, watch for drowsiness. Call if not gradually better, call if red flag symptoms, see AVS. Anxiety, on Xanax rarely, RF sent at patient request.

## 2022-01-25 ENCOUNTER — Ambulatory Visit: Payer: Medicare Other | Admitting: Internal Medicine

## 2022-01-25 NOTE — Assessment & Plan Note (Signed)
Neck pain:  Started after a fall about 2 weeks ago, although she does not recall details of the fall she noted ecchymosis on the right forehead and right elbow and she knows she did not loss consciousness.  Neck pain is most likely due to hyperextension of the cervical spine. X-rays was negative, no red flag symptoms, motor and DTR symmetric. Plan: Continue heating pad and Tylenol.  Add a low-dose of prednisone and a muscle relaxant, watch for drowsiness. Call if not gradually better, call if red flag symptoms, see AVS. Anxiety, on Xanax rarely, RF sent at patient request.

## 2022-01-27 DIAGNOSIS — Z1231 Encounter for screening mammogram for malignant neoplasm of breast: Secondary | ICD-10-CM | POA: Diagnosis not present

## 2022-01-27 DIAGNOSIS — Z780 Asymptomatic menopausal state: Secondary | ICD-10-CM | POA: Diagnosis not present

## 2022-01-27 DIAGNOSIS — M8589 Other specified disorders of bone density and structure, multiple sites: Secondary | ICD-10-CM | POA: Diagnosis not present

## 2022-01-30 ENCOUNTER — Encounter: Payer: Self-pay | Admitting: Obstetrics & Gynecology

## 2022-01-31 ENCOUNTER — Ambulatory Visit (INDEPENDENT_AMBULATORY_CARE_PROVIDER_SITE_OTHER): Payer: Medicare Other | Admitting: Internal Medicine

## 2022-01-31 ENCOUNTER — Encounter: Payer: Self-pay | Admitting: Internal Medicine

## 2022-01-31 VITALS — BP 138/78 | HR 79 | Temp 97.9°F | Resp 12 | Ht 61.0 in | Wt 124.8 lb

## 2022-01-31 DIAGNOSIS — Z23 Encounter for immunization: Secondary | ICD-10-CM | POA: Diagnosis not present

## 2022-01-31 DIAGNOSIS — W19XXXD Unspecified fall, subsequent encounter: Secondary | ICD-10-CM | POA: Diagnosis not present

## 2022-01-31 DIAGNOSIS — M542 Cervicalgia: Secondary | ICD-10-CM | POA: Diagnosis not present

## 2022-01-31 NOTE — Progress Notes (Unsigned)
Subjective:    Patient ID: Patricia Moore, female    DOB: 08/18/1933, 88 y.o.   MRN: [id number]  DOS:  01/31/2022 Type of visit - description: Acute  Had a fall approximately 3 weeks ago. Was seen by Dr. Carmelia Roller, x-ray was negative. I saw her last week, prescribed prednisone and muscle relaxant, they did not help. Continue with pain located at the posterior neck, much worse when she changes position or turn her head left - right or up - down. Denies any paresthesias of the upper or lower extremities. Denies nausea vomiting.   Review of Systems See above   Past Medical History:  Diagnosis Date   Anxiety 12/09/2013   Arthritis    Broken bones    face   Cataract    Diabetes mellitus    Diverticulitis    patient states has had three attacks    Diverticulosis    DJD (degenerative joint disease) 12/14/2014   Glaucoma    H. pylori infection 2001   Heart murmur    Hx of adenomatous colonic polyps    Hyperlipidemia    Hypertension    Intraductal papilloma    bx nef x 2 in the 70s-90s   Saccular aneurysm    SCC (squamous cell carcinoma) 03/2011   sees dermatology    Past Surgical History:  Procedure Laterality Date   BREAST BIOPSY  '91, '95   INTRADUCTAL PAPILLOMA   BREAST LUMPECTOMY     CATARACT EXTRACTION Bilateral 09/2014   CLOSED REDUCTION ULNAR SHAFT Right 10/06/2017   Procedure: CLOSED REDUCTION DISLOCATED RIGHT SHOULDER;  Surgeon: Kathryne Hitch, MD;  Location: [facility] OR;  Service: Orthopedics;  Laterality: Right;   COLONOSCOPY      Current Outpatient Medications  Medication Instructions   ALPRAZolam (XANAX) 0.25 MG tablet TAKE 1 TABLET(0.25 MG) BY MOUTH DAILY AS NEEDED FOR ANXIETY   carvedilol (COREG) 25 MG tablet TAKE 1 TABLET(25 MG) BY MOUTH TWICE DAILY WITH A MEAL   cholecalciferol (VITAMIN D) 1,000 Units, Oral, Daily   co-enzyme Q-10 30 mg, Oral, Daily   dorzolamide-timolol (COSOPT) 22.3-6.8 MG/ML ophthalmic solution 1 drop, Both Eyes, 2 times daily    Lancets (ONETOUCH ULTRASOFT) lancets TEST BLOOD SUGAR NO MORE THEN TWICE DAILY   metFORMIN (GLUCOPHAGE) 1000 MG tablet TAKE 1 TABLET BY MOUTH WITH BREAKFAST, 1 TABLET WITH LUNCH THEN 1/2 TABLET AT BEDTIME   ONETOUCH ULTRA test strip CHECK BLOOD SUGAR NO MORE THAN 2 TIMES PER DAY   RESTASIS 0.05 % ophthalmic emulsion No dose, route, or frequency recorded.   Semaglutide,0.25 or 0.5MG /DOS, 2 MG/3ML SOPN INJECT 0.5 UNDER THE SKIN ONCE A WEEK       Objective:   Physical Exam BP 138/78 (BP Location: Left Arm, Cuff Size: Normal)   Pulse 79   Temp 97.9 F (36.6 C) (Oral)   Resp 12   Ht 5\' 1"  (1.549 m)   Wt 124 lb 12.8 oz (56.6 kg)   LMP 04/17/1988   SpO2 99%   BMI 23.58 kg/m  General:   Well developed, NAD, BMI noted. HEENT:  Normocephalic . Face symmetric, atraumatic Neck: Cervical spine no TTP, no deformities. Range of motion decreased due to pain. Neck posture is antalgic Lower extremities: no pretibial edema bilaterally  Skin: Not pale. Not jaundice Neurologic:  alert & oriented X3.  Speech normal, gait appropriate for age and unassisted Motor and DTR symmetric Psych--  Cognition and judgment appear intact.  Cooperative with normal attention span and concentration.  Behavior appropriate. No anxious or depressed appearing.      Assessment     Assessment  DM HTN -Losartan d/c d/t hyperkalemia -Amlodipine d/c d/t edema 07-2017 - HCTZ d/c d/t hyperkalemia see note 08-27-17 Hyperlipidemia -- intolerant to lipitor and crestor (severe cramps).  Declined further trial with medications  on 01/16/2018   Anxiety- xanax rx by pcp Vertigo - valium prn GI: --Recurrent diverticulitis (one episode documented by a CT 11-2014) --H. pylori 2001 DJD--back pain, knees, neck pain ( on baclofen for neck) Osteoporosis : Tscore -1.9 on 11-2015 , T score  -1.8 (03/2019); h/o  R ulnar FX 2019 SCC, skin cancer Elevated homocysteine Raynaud's phenomena  NEURO --Carotid US 10-2013: No  significant plaque, although ICA's are serpentine, bilaterally. 40-59% bilateral ICA stenosis by velocity criteria likely due to tortuosity. Patent vertebral arteries with antegrade flow.Normal subclavian arteries, bilaterally. -- Transinet dizziness, diplopia sx late 2016 , saw neuro >>> Dx w/ TIA/Stroke on clinical grounds  W/u:  Brain  MRI 03-2015 >> 2 mm saccular aneurysm at the right posterior communicating artery origin, saw  Neurology, Rx to recheck MRI 1 year, ok continue ASA per neuro CTA head, neck 05-14-2015: aneurysm confirmed, otherwise (-) --brain aneurysm see above, saw neuro 2018.  MRA head 08/2019 stable, recheck 5 years per neuro --Admitted 07-2015, dizziness, MRI negative, DX labyrinthitis   PLAN: Neck pain: Had a fall 3 weeks ago, x-ray with no acute changes, was prescribed prednisone, Robaxin, they have not helped much. Taking Tylenol, ibuprofen, heating pad and topical lidocaine: The combination of things seems to help to only some extent. Plan: Decline second round of prednisone or a different muscle relaxant Continue Tylenol, ibuprofen GI precautions discussed, continue heating pad and lidocaine.   Refer to Ortho. Arrange for neck MRI at patient request  Time spent 35 minutes, extensive discussion about treatment option, explained the benefit of a re- trial with prednisone or a different muscle relaxant, she declined. Explained orthopedics will likely order further imaging but she is adamant about getting a MRI.

## 2022-01-31 NOTE — Patient Instructions (Signed)
We will arrange for an MRI of your neck  We will arrange orthopedic referral  Continue with a heating pad  Continue Tylenol  500 mg OTC 2 tabs a day every 8 hours as needed for pain  IBUPROFEN (Advil or Motrin) 200 mg: Take sparingly, 2 tablets only once daily.  Always take it with food because may cause gastritis and ulcers.  If you notice nausea, stomach pain, change in the color of stools --->  Stop the medicine and let us know

## 2022-02-01 ENCOUNTER — Encounter: Payer: Self-pay | Admitting: Surgery

## 2022-02-01 ENCOUNTER — Emergency Department (HOSPITAL_COMMUNITY)
Admission: EM | Admit: 2022-02-01 | Discharge: 2022-02-01 | Disposition: A | Discharge status: Home / Self Care | Payer: Medicare Other | Attending: Emergency Medicine | Admitting: Emergency Medicine

## 2022-02-01 ENCOUNTER — Telehealth (HOSPITAL_BASED_OUTPATIENT_CLINIC_OR_DEPARTMENT_OTHER): Payer: Self-pay

## 2022-02-01 ENCOUNTER — Ambulatory Visit (INDEPENDENT_AMBULATORY_CARE_PROVIDER_SITE_OTHER): Payer: Medicare Other | Admitting: Surgery

## 2022-02-01 ENCOUNTER — Encounter (HOSPITAL_COMMUNITY): Payer: Self-pay

## 2022-02-01 ENCOUNTER — Emergency Department (HOSPITAL_COMMUNITY): Payer: Medicare Other

## 2022-02-01 ENCOUNTER — Other Ambulatory Visit: Payer: Self-pay

## 2022-02-01 VITALS — BP 183/75 | HR 80 | Ht 61.0 in | Wt 124.0 lb

## 2022-02-01 DIAGNOSIS — W182XXA Fall in (into) shower or empty bathtub, initial encounter: Secondary | ICD-10-CM | POA: Diagnosis not present

## 2022-02-01 DIAGNOSIS — S12191A Other nondisplaced fracture of second cervical vertebra, initial encounter for closed fracture: Secondary | ICD-10-CM | POA: Diagnosis not present

## 2022-02-01 DIAGNOSIS — Y92002 Bathroom of unspecified non-institutional (private) residence single-family (private) house as the place of occurrence of the external cause: Secondary | ICD-10-CM | POA: Diagnosis not present

## 2022-02-01 DIAGNOSIS — Z79899 Other long term (current) drug therapy: Secondary | ICD-10-CM | POA: Insufficient documentation

## 2022-02-01 DIAGNOSIS — I10 Essential (primary) hypertension: Secondary | ICD-10-CM | POA: Insufficient documentation

## 2022-02-01 DIAGNOSIS — Z7984 Long term (current) use of oral hypoglycemic drugs: Secondary | ICD-10-CM | POA: Insufficient documentation

## 2022-02-01 DIAGNOSIS — S12101A Unspecified nondisplaced fracture of second cervical vertebra, initial encounter for closed fracture: Secondary | ICD-10-CM | POA: Diagnosis not present

## 2022-02-01 DIAGNOSIS — Z794 Long term (current) use of insulin: Secondary | ICD-10-CM | POA: Diagnosis not present

## 2022-02-01 DIAGNOSIS — S0990XA Unspecified injury of head, initial encounter: Secondary | ICD-10-CM

## 2022-02-01 DIAGNOSIS — S12030A Displaced posterior arch fracture of first cervical vertebra, initial encounter for closed fracture: Secondary | ICD-10-CM | POA: Diagnosis not present

## 2022-02-01 DIAGNOSIS — Z043 Encounter for examination and observation following other accident: Secondary | ICD-10-CM | POA: Diagnosis not present

## 2022-02-01 DIAGNOSIS — G9389 Other specified disorders of brain: Secondary | ICD-10-CM | POA: Diagnosis not present

## 2022-02-01 DIAGNOSIS — S199XXA Unspecified injury of neck, initial encounter: Secondary | ICD-10-CM | POA: Diagnosis present

## 2022-02-01 DIAGNOSIS — W19XXXA Unspecified fall, initial encounter: Secondary | ICD-10-CM

## 2022-02-01 DIAGNOSIS — E119 Type 2 diabetes mellitus without complications: Secondary | ICD-10-CM | POA: Insufficient documentation

## 2022-02-01 LAB — CBC
HCT: 42.6 % (ref 36.0–46.0)
Hemoglobin: 14.4 g/dL (ref 12.0–15.0)
MCH: 31 pg (ref 26.0–34.0)
MCHC: 33.8 g/dL (ref 30.0–36.0)
MCV: 91.8 fL (ref 80.0–100.0)
Platelets: 414 10*3/uL — ABNORMAL HIGH (ref 150–400)
RBC: 4.64 MIL/uL (ref 3.87–5.11)
RDW: 12.5 % (ref 11.5–15.5)
WBC: 11.2 10*3/uL — ABNORMAL HIGH (ref 4.0–10.5)
nRBC: 0 % (ref 0.0–0.2)

## 2022-02-01 LAB — BASIC METABOLIC PANEL
Anion gap: 11 (ref 5–15)
BUN: 30 mg/dL — ABNORMAL HIGH (ref 8–23)
CO2: 31 mmol/L (ref 22–32)
Calcium: 10 mg/dL (ref 8.9–10.3)
Chloride: 96 mmol/L — ABNORMAL LOW (ref 98–111)
Creatinine, Ser: 0.86 mg/dL (ref 0.44–1.00)
GFR, Estimated: >60 mL/min (ref >60)
Glucose, Bld: 90 mg/dL (ref 70–99)
Potassium: 4.7 mmol/L (ref 3.5–5.1)
Sodium: 138 mmol/L (ref 135–145)

## 2022-02-01 MED ORDER — OXYCODONE HCL 5 MG PO TABS: 2.5000 mg | ORAL_TABLET | ORAL | 0 refills | Status: DC | PRN

## 2022-02-01 MED ORDER — ACETAMINOPHEN 500 MG PO TABS
1000.0000 mg | ORAL_TABLET | Freq: Once | ORAL | Status: AC
  Administered 2022-02-01: 1000 mg via ORAL
  Filled 2022-02-01: qty 2

## 2022-02-01 MED ORDER — OXYCODONE HCL 5 MG PO TABS
2.5000 mg | ORAL_TABLET | Freq: Once | ORAL | Status: AC
  Administered 2022-02-01: 2.5 mg via ORAL
  Filled 2022-02-01: qty 1

## 2022-02-01 NOTE — ED Triage Notes (Signed)
Pt states 13 days ago she had a fall in the bathroom, falling straight back and hitting her head on the tile. Pt denies LOC. No blood thinners per pt. Pt c/o headache, neck pain, bilateral shoulder pain. Pt ambulatory to triage area.

## 2022-02-01 NOTE — ED Notes (Signed)
[facility] c-collar placed by triage RN Vernice Jefferson.

## 2022-02-01 NOTE — Progress Notes (Signed)
88 year old female comes in today with complaints of worsening headache, balance issues, nausea.  Patient states that this problem started a couple weeks ago.  I reviewed her notes and patient was seen by primary care provider Dr. Nani Ravens January 20, 2022 after patient has slipped on the floor and hit the back of her head.  Primary care provider ordered cervical spine x-rays which showed:  CLINICAL DATA:  [place] this morning, neck pain, trauma cervical spine   EXAM: CERVICAL SPINE - COMPLETE 4+ VIEW   COMPARISON:  02/15/2015   FINDINGS: Reversal of cervical lordosis question muscle spasm.   Prevertebral soft tissues normal thickness.   Diffuse osseous demineralization with multilevel facet degenerative changes.   Disc space narrowing and endplate spur formation at C4-C5, C5-C6, and C6-C7.   No fracture, subluxation, or bone destruction.   Uncovertebral spurs encroach upon multiple cervical neural foramina bilaterally.   Lung apices clear.   IMPRESSION: Multilevel degenerative disc and facet disease changes cervical spine as above.   No acute abnormalities.     Electronically Signed   By: Lavonia Dana M.D.   On: 01/22/2022 16:15  Patient and her husband who is present states that primary care provider Dr. Nani Ravens did not advised patient to get evaluated in the emergency room for her head trauma.  Patient's past medical history significant for cerebral aneurysm.  I reviewed patient's history of imaging studies and she had MR angio head without contrast performed February 07, 2021 and that study showed:   EXAM: MRA HEAD WITHOUT CONTRAST   TECHNIQUE: Angiographic images of the Circle of Willis were acquired using MRA technique without intravenous contrast.   COMPARISON:  Previous MRA from 08/25/2019.   FINDINGS: Anterior circulation: Visualized distal cervical segments of the internal carotid arteries are widely patent with antegrade flow. Petrous, cavernous, and  supraclinoid segments remain widely patent without stenosis. Origins of the ophthalmic arteries patent and normal. No 2 mm aneurysm at the expected takeoff of the right posterior communicating artery again seen, not significantly changed or progressed from prior (series 5, image 65). A1 segments remain patent. Normal anterior communicating artery complex. Anterior cerebral arteries patent to their distal aspects without stenosis. No M1 stenosis or occlusion. Normal MCA bifurcations. Distal MCA branches well perfused and symmetric.   Posterior circulation: Visualized vertebral arteries widely patent to the vertebrobasilar junction without stenosis. Both PICA origins patent and normal. Basilar widely patent to its distal aspect without stenosis. Superior cerebellar arteries patent bilaterally. Right PCA primarily supplied via the basilar, although a small right posterior communicating artery noted. Fetal type origin of the left PCA. Both PCAs well perfused or distal aspects without stenosis.   Anatomic variants: Fetal type origin left PCA.   Other: No new aneurysm or other vascular abnormality.   IMPRESSION: 1. 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 2. Otherwise stable and normal intracranial MRA.     Electronically Signed   By: Jeannine Boga M.D.   On: 02/08/2021 04:24   Patient is very well of her cerebral aneurysm diagnosis and this has been concerning to her that the primary care providers have not focused on her head.  She was seen by Dr. Belinda Fisher primary care provider January 20, 2022 and then again yesterday.  Patient has been prescribed oral prednisone and muscle laxer that did not help.  Physician ordered cervical MRI but again no mention of work-up for the head injury. Patient currently reports ongoing constant headache, balance issues and nausea.  She  has not had any improvement.   Plan I advised patient that with the head injury and history of  cerebral aneurysm that I am concerned about this and she needs emergent evaluation.  I advised him to go immediately to the emergency room for treatment and the ER physician will decide if emergency head imaging is indicated.  I stressed to them the importance of getting this evaluated today.  I will have patient follow-up with me in 1 week and then I can evaluate her neck after head issues get addressed.  I asked patient and her husband to contact me to let me know that they have been seen at the ER.  All questions answered.

## 2022-02-01 NOTE — Discharge Instructions (Addendum)
You were seen in the ER today for evaluation of your neck pain. We have discussed with our neurosurgery department and they would like for you to follow-up in the office in 1 week.  Please call them tomorrow to schedule an appointment.  In the meantime, you can take 1000 mg of Tylenol and 600 mg of ibuprofen every 6 hours as needed for pain.  I have also prescribed you a few narcotic pain medications.  Please break these pills in half and take as needed for breakthrough pain.  Please do not drive or operate heavy machinery while on this medication as it can make you sleepy.  Do not take off the c-collar.  This will need to remain in place to keep your neck stabilized.  If you have any concerns, new or worsening symptoms, please return to the nearest emergency department for reevaluation.  Le atendieron hoy en urgencias para evaluar su dolor de cuello. Hemos hablado con nuestro departamento de neurociruga y les gustara que usted hiciera un seguimiento en el consultorio en 1 semana. Llmelos maana para programar una cita. Mientras tanto, puede tomar 1000 mg de Tylenol y 600 mg de ibuprofeno cada 6 horas segn sea necesario para Conservation officer, historic buildings. Tambin le he recetado algunos analgsicos narcticos. Parta estas pastillas por la mitad y tmelas segn sea necesario para el dolor irruptivo. No conduzca ni opere maquinaria pesada mientras est tomando [employer], ya que puede provocarle sueo. No te quites el cuello en C. Ser necesario que permanezca en su lugar para mantener el cuello estabilizado. Si tiene [place] inquietud, sntomas nuevos o que [place], regrese al departamento de emergencias ms cercano para una reevaluacin.  Comunquese con un mdico si: Tiene irritacin o llagas en la piel debido al dispositivo ortopdico o collarn cervical. Solicite ayuda de inmediato si: El dolor de cuello Raoul. Tiene dificultad para tragar o respirar. El cuello comienza a hincharse. Tiene alguno de los [employer] brazos, las piernas o ambos: Adormecimiento. Debilidad. Dolor urente. Dificultades en el movimiento. No puede controlar cundo Production assistant, radio (incontinencia). Tiene dificultad con la coordinacin o la marcha. Estos sntomas pueden representar un problema grave que constituye Engineer, maintenance (IT). No espere a ver si los sntomas desaparecen. Solicite atencin mdica de inmediato. Comunquese con el servicio de emergencias de su localidad (911 en los [place]). No conduzca por sus propios medios [employer].
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
## 2022-02-01 NOTE — ED Notes (Signed)
ED provider at bedside.

## 2022-02-01 NOTE — Assessment & Plan Note (Signed)
Neck pain: Had a fall 3 weeks ago, x-ray with no acute changes, was prescribed prednisone, Robaxin, they have not helped much. Taking Tylenol, ibuprofen, heating pad and topical lidocaine: The combination of things seems to help to only some extent. Plan: Decline second round of prednisone or a different muscle relaxant Continue Tylenol, ibuprofen GI precautions discussed, continue heating pad and lidocaine.   Refer to Ortho. Arrange for neck MRI at patient request

## 2022-02-01 NOTE — ED Notes (Signed)
Per pt she has been taking tylenol '1000mg'$  since her fall for the pain and it hasn't helped. ED provider made aware

## 2022-02-01 NOTE — ED Notes (Signed)
Provider at bedside

## 2022-02-02 ENCOUNTER — Ambulatory Visit (INDEPENDENT_AMBULATORY_CARE_PROVIDER_SITE_OTHER): Payer: Medicare Other | Admitting: Orthopedic Surgery

## 2022-02-02 ENCOUNTER — Telehealth: Payer: Self-pay | Admitting: Internal Medicine

## 2022-02-02 ENCOUNTER — Ambulatory Visit: Payer: Self-pay

## 2022-02-02 ENCOUNTER — Telehealth: Payer: Self-pay | Admitting: Radiology

## 2022-02-02 DIAGNOSIS — S0990XA Unspecified injury of head, initial encounter: Secondary | ICD-10-CM | POA: Diagnosis not present
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
## 2022-02-02 NOTE — Telephone Encounter (Signed)
Noted, thank you

## 2022-02-02 NOTE — Telephone Encounter (Signed)
Betsy with Dr. Benjiman Core office calling to let Dr. Larose Kells know that patient was complaining of increase of headaches, nausea, severe pain so Dr. Ricard Dillon was concerned about a head injury from when she fell so he sent her to the ED. They found a C1/C2 fracture and is supposed to follow up with Neuro Surgery. They put her in a collar. Dr. Ricard Dillon wanted Dr. Larose Kells to know.

## 2022-02-02 NOTE — Telephone Encounter (Signed)
I spoke with Colletta Maryland at Dr. Larose Kells office and advised patient was seen by Benjiman Core, PA-C yesterday with worsening headache, nausea, severe pain, and balance problems. James sent patient to ED to have imaging done due to concern of head injury. Patient has C1 and C2 fractures and is to follow up with neurosurgery per ED note. FYI to Dr. Larose Kells.

## 2022-02-04 ENCOUNTER — Telehealth: Payer: Self-pay | Admitting: Internal Medicine

## 2022-02-04 NOTE — Telephone Encounter (Signed)
Chart reviewed, saw orthopedic surgery yesterday, has a C1 and C2 fractures, surgery is an option. I spoke with the patient, still has pain but is somewhat better with the low-dose of oxycodone. She plans to see orthopedics next week although she reports  is not inclined to have surgery. Advised to call me if needed.

## 2022-02-06 ENCOUNTER — Ambulatory Visit: Payer: Medicare Other | Admitting: Surgery

## 2022-02-06 ENCOUNTER — Telehealth: Payer: Self-pay | Admitting: Orthopedic Surgery

## 2022-02-06 MED ORDER — OXYCODONE HCL 5 MG PO TABS: 2.5000 mg | ORAL_TABLET | ORAL | 0 refills | Status: DC | PRN

## 2022-02-06 NOTE — Telephone Encounter (Signed)
I called and advised that her medication was sent in and that we will have her a new brace by the time her appointment on Thursday

## 2022-02-06 NOTE — Telephone Encounter (Signed)
This is pending with the Dr.

## 2022-02-06 NOTE — Telephone Encounter (Signed)
Pt's husband advised she is waiting on a call about pain medication and has not received one. Please call [phone]

## 2022-02-06 NOTE — Telephone Encounter (Signed)
Patient would like a refill on oxycodone called in.

## 2022-02-06 NOTE — Telephone Encounter (Signed)
Patient was here. Needs new neck brace. Too big. [phone]

## 2022-02-09 ENCOUNTER — Ambulatory Visit (INDEPENDENT_AMBULATORY_CARE_PROVIDER_SITE_OTHER): Payer: Medicare Other | Admitting: Orthopedic Surgery

## 2022-02-09 ENCOUNTER — Ambulatory Visit (INDEPENDENT_AMBULATORY_CARE_PROVIDER_SITE_OTHER): Payer: Medicare Other

## 2022-02-09 VITALS — BP 156/77

## 2022-02-09 DIAGNOSIS — S0990XA Unspecified injury of head, initial encounter: Secondary | ICD-10-CM | POA: Diagnosis not present

## 2022-02-09 MED ORDER — OXYCODONE HCL 5 MG PO TABS: 2.5000 mg | ORAL_TABLET | ORAL | 0 refills | Status: AC | PRN
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
## 2022-02-13 DIAGNOSIS — I671 Cerebral aneurysm, nonruptured: Secondary | ICD-10-CM | POA: Diagnosis not present

## 2022-02-13 DIAGNOSIS — S12090A Other displaced fracture of first cervical vertebra, initial encounter for closed fracture: Secondary | ICD-10-CM | POA: Diagnosis not present

## 2022-02-16 ENCOUNTER — Ambulatory Visit (INDEPENDENT_AMBULATORY_CARE_PROVIDER_SITE_OTHER): Payer: Medicare Other | Admitting: Orthopedic Surgery

## 2022-02-16 ENCOUNTER — Ambulatory Visit (INDEPENDENT_AMBULATORY_CARE_PROVIDER_SITE_OTHER): Payer: Medicare Other

## 2022-02-16 DIAGNOSIS — S0990XA Unspecified injury of head, initial encounter: Secondary | ICD-10-CM

## 2022-02-16 MED ORDER — OXYCODONE HCL 5 MG PO TABS: 2.5000 mg | ORAL_TABLET | ORAL | 0 refills | Status: AC | PRN
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
## 2022-02-20 ENCOUNTER — Other Ambulatory Visit: Payer: Self-pay | Admitting: Internal Medicine

## 2022-02-24 ENCOUNTER — Ambulatory Visit: Payer: Medicare Other | Admitting: Obstetrics & Gynecology

## 2022-02-27 ENCOUNTER — Ambulatory Visit (INDEPENDENT_AMBULATORY_CARE_PROVIDER_SITE_OTHER): Payer: Medicare Other

## 2022-02-27 ENCOUNTER — Ambulatory Visit: Payer: Medicare Other | Admitting: Orthopedic Surgery

## 2022-02-27 DIAGNOSIS — S0990XA Unspecified injury of head, initial encounter: Secondary | ICD-10-CM

## 2022-02-27 MED ORDER — TRAMADOL HCL 50 MG PO TABS: 50.0000 mg | ORAL_TABLET | Freq: Four times a day (QID) | ORAL | 0 refills | Status: AC | PRN
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
## 2022-02-28 ENCOUNTER — Telehealth: Payer: Self-pay | Admitting: Internal Medicine

## 2022-02-28 ENCOUNTER — Other Ambulatory Visit: Payer: Self-pay

## 2022-02-28 DIAGNOSIS — E119 Type 2 diabetes mellitus without complications: Secondary | ICD-10-CM

## 2022-02-28 NOTE — Telephone Encounter (Signed)
Pt called wanting to know if we had any Ozempic samples available for her, pt stated last time came in office asked about it and was informed there was no samples available, Pt was informed that was given documents for pt assistance program since she was due since June, pt mentioned still had no chance to fill it out paperwork, pt will try to fill out documents ASAP to bring in office. Pt still wanted to know if by any chance can get a sample if it comes in our office. Please advise.

## 2022-03-01 NOTE — Addendum Note (Signed)
Addended byDamita Dunnings D on: 03/01/2022 09:26 AM   Modules accepted: Orders

## 2022-03-02 ENCOUNTER — Telehealth: Payer: Self-pay | Admitting: Pharmacist

## 2022-03-02 NOTE — Telephone Encounter (Signed)
-----   Message from [place], [place] sent at 02/28/2022  4:24 PM EST ----- Regarding: Ozempic I have placed a referral to you. Pt needs Ozempic Pt assistance renewal please.

## 2022-03-02 NOTE — Telephone Encounter (Signed)
Patient might already have an application for [employer] in June but has not completed yet. Tried to contact patient to assist. No answer. LM on VM with my contact number [phone].  Could also assess to see if patient might qualify for LIS/ Medicare Extra Help.

## 2022-03-03 NOTE — Telephone Encounter (Signed)
Patient called back and stated Tammy could give her a call back next week. Please advise.

## 2022-03-06 ENCOUNTER — Ambulatory Visit (INDEPENDENT_AMBULATORY_CARE_PROVIDER_SITE_OTHER): Payer: Medicare Other | Admitting: Orthopedic Surgery

## 2022-03-06 DIAGNOSIS — S1201XD Stable burst fracture of first cervical vertebra, subsequent encounter for fracture with routine healing: Secondary | ICD-10-CM

## 2022-03-06 NOTE — Progress Notes (Signed)
Orthopedic Office Brief Note  Patient came to the office because her C collar is irritating her chin. She has tried to make some modifications to the C collar on her own but that is not helping. She has had no neurologic changes. Pain is slowly getting better. Using tylenol during the day and narcotics at night. Adjusted her C collar to make sure good fit and then added ABD pad to the chin piece to give it extra padding. She felt better with the ABD. Provided her with several ABDs to use. Explained that these are commonly sold at the pharmacy as well should she run out. Told her she can come back to the office if we need to do further adjustments. Otherwise, I will plan on seeing her back at her already scheduled follow up.   Callie Fielding, MD Orthopedic Surgeon

## 2022-03-06 NOTE — Progress Notes (Signed)
Patient came by to have neck collar adjusted. Okay, per Dr. Laurance Flatten

## 2022-03-08 ENCOUNTER — Telehealth: Payer: Self-pay

## 2022-03-08 NOTE — Progress Notes (Signed)
Chronic Care Management   Note  03/08/2022 Name: Patricia Moore MRN: [id number] DOB: 09-Dec-1933  Patricia Moore is a 88 y.o. year old female who is a primary care patient of Drue Novel, Nolon Rod, MD. I reached out to Patricia Moore by phone today in response to a referral sent by Patricia Moore's PCP.  Patricia Moore was not successfully contacted today. A HIPAA compliant voice message was left requesting a return call.   Follow up plan: Additional outreach attempts will be made.  Penne Lash, RMA Care Guide [employer]  [place], [place] [address] Direct Dial: [phone] [email or web address]

## 2022-03-13 NOTE — Telephone Encounter (Signed)
Tried to contact patient - no answer / LM on VM with contact information - [phone] or [phone].

## 2022-03-22 ENCOUNTER — Ambulatory Visit: Payer: Self-pay

## 2022-03-22 ENCOUNTER — Ambulatory Visit (INDEPENDENT_AMBULATORY_CARE_PROVIDER_SITE_OTHER): Payer: Medicare Other

## 2022-03-22 ENCOUNTER — Ambulatory Visit: Payer: Medicare Other | Admitting: Orthopedic Surgery

## 2022-03-22 DIAGNOSIS — S1201XD Stable burst fracture of first cervical vertebra, subsequent encounter for fracture with routine healing: Secondary | ICD-10-CM

## 2022-03-22 DIAGNOSIS — M545 Low back pain, unspecified: Secondary | ICD-10-CM

## 2022-03-22 DIAGNOSIS — S0990XA Unspecified injury of head, initial encounter: Secondary | ICD-10-CM
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
## 2022-03-23 NOTE — Telephone Encounter (Signed)
3rd attempt to reach patient. Use Spanish interpreter service . Left message to contact office for assistance with medication assistance program application for Ozempic.

## 2022-03-27 DIAGNOSIS — S12090A Other displaced fracture of first cervical vertebra, initial encounter for closed fracture: Secondary | ICD-10-CM | POA: Diagnosis not present

## 2022-03-27 DIAGNOSIS — S12090G Other displaced fracture of first cervical vertebra, subsequent encounter for fracture with delayed healing: Secondary | ICD-10-CM | POA: Diagnosis not present

## 2022-03-30 ENCOUNTER — Ambulatory Visit (INDEPENDENT_AMBULATORY_CARE_PROVIDER_SITE_OTHER): Payer: Medicare Other | Admitting: Pharmacist

## 2022-03-30 DIAGNOSIS — E119 Type 2 diabetes mellitus without complications: Secondary | ICD-10-CM | POA: Diagnosis not present

## 2022-03-30 DIAGNOSIS — H532 Diplopia: Secondary | ICD-10-CM | POA: Diagnosis not present

## 2022-03-30 NOTE — Progress Notes (Addendum)
03/30/2022 Name: Patricia Moore MRN: [id number] DOB: 1934-01-08  Chief Complaint  Patient presents with   Medication Problem    Patricia Moore is a 88 y.o. year old female who was referred for medication management by their primary care provider, Wanda Plump, MD. They presented for a face to face visit today.   They were referred to the pharmacist by their PCP for assistance in managing diabetes and medication access   Subjective:  Care Team: Primary Care Provider: Wanda Plump, MD ; Next Scheduled Visit: 05/29/2022   Medication Access/Adherence Patient had been taking Ozempic 0.5mg  weekly and metformin 500mg  twice a day with good results. She reached the Medicare Coverage gap in June. She has been provided with samples for a few months but has not had any Ozempic for the last month.  She states she send [employer] needed income information but has not heard from them. She also states she completed medication assistance program application but I was unable to find recent application in her record.   Current Pharmacy:  [id number] - [place], [place] - [address] [facility] [address] [place] [address] Phone: [phone] Fax: [phone]  [facility] [address] [place] [address] Phone: [phone] Fax: [phone]   Patient reports affordability concerns with their medications: Yes  Patient reports access/transportation concerns to their pharmacy: No  Patient reports adherence concerns with their medications:  Yes  due to being in coverage gap.   Diabetes:  Current medications:  Medications tried in the past:   Current glucose readings: patient did not have records with her today  Patient denies hypoglycemic s/sx including dizziness, shakiness, sweating. Patient denies hyperglycemic symptoms including; polyuria, polydipsia, polyphagia, nocturia,  neuropathy, blurred vision.   Objective: Lab Results  Component Value Date   HGBA1C 6.0 11/25/2021    Lab Results  Component Value Date   CREATININE 0.86 02/01/2022   BUN 30 (H) 02/01/2022   NA 138 02/01/2022   K 4.7 02/01/2022   CL 96 (L) 02/01/2022   CO2 31 02/01/2022    Lab Results  Component Value Date   CHOL 192 05/26/2021   HDL 64.60 05/26/2021   LDLCALC 109 (H) 05/26/2021   LDLDIRECT 131.9 10/28/2010   TRIG 93.0 05/26/2021   CHOLHDL 3 05/26/2021    Medications Reviewed Today     Reviewed by Henrene Pastor, RPH-CPP (Pharmacist) on 03/30/22 at 1457  Med List Status: <None>   Medication Order Taking? Sig Documenting Provider Last Dose Status Informant  ALPRAZolam (XANAX) 0.25 MG tablet [id number] Yes TAKE 1 TABLET(0.25 MG) BY MOUTH DAILY AS NEEDED FOR ANXIETY Wanda Plump, MD Taking Active   carvedilol (COREG) 25 MG tablet [id number] Yes TAKE 1 TABLET(25 MG) BY MOUTH TWICE DAILY WITH A MEAL Paz, Nolon Rod, MD Taking Active   cholecalciferol (VITAMIN D) 1000 UNITS tablet [id number] Yes Take 1,000 Units by mouth daily. [provider] Taking Active Multiple Informants           Med Note (CANTER, [facility] D   Tue Oct 16, 2017  3:30 PM)    co-enzyme Q-10 30 MG capsule [id number]  Take 30 mg by mouth daily. [provider]  Active Multiple Informants           Med Note (CANTER, KAYLYN D   Tue Oct 16, 2017  3:30 PM)  dorzolamide-timolol (COSOPT) 22.3-6.8 MG/ML ophthalmic solution [id number] Yes Place 1 drop into both eyes 2 (two) times daily.  [provider] Taking Active Multiple Informants           Med Note (CANTER, Paula Libra   Tue Oct 16, 2017  3:30 PM)    Lancets Letta Pate ULTRASOFT) lancets [id number] Yes TEST BLOOD SUGAR NO MORE THEN TWICE DAILY Wanda Plump, MD Taking Active   metFORMIN (GLUCOPHAGE) 1000 MG tablet [id number] Yes TAKE 1 TABLET WITH BREAKFAST AND LUNCH THEN 1/2 TABLET AT BEDTIME Wanda Plump, MD Taking Active   [facility] ULTRA test strip  [id number] Yes CHECK BLOOD SUGAR NO MORE THAN 2 TIMES PER DAY Wanda Plump, MD Taking Active   RESTASIS 0.05 % ophthalmic emulsion [id number] No   Patient not taking: Reported on 03/30/2022   [provider] Not Taking Active   Semaglutide,0.25 or 0.5MG /DOS, 2 MG/3ML Namon Cirri [id number] No INJECT 0.5 UNDER THE SKIN ONCE A WEEK  Patient not taking: Reported on 03/30/2022   Wanda Plump, MD Not Taking Active               Assessment/Plan:   Diabetes: - Currently controlled - Reviewed long term cardiovascular and renal outcomes of uncontrolled blood sugar - Reviewed goal A1c, goal fasting, and goal 2 hour post prandial glucose - Assisted patient in filling out [employer] patient assistance program in office today. Provided with sample of Ozempic 0.5mg  weekly - 4 doses (Lot: [id number] and exp 10/15/2023) - Restart Ozempic and continue metformin   Follow Up Plan: 1 month  Henrene Pastor, PharmD Clinical Pharmacist [facility]

## 2022-04-03 ENCOUNTER — Encounter: Payer: Self-pay | Admitting: Obstetrics & Gynecology

## 2022-04-04 NOTE — Progress Notes (Signed)
Chronic Care Management   Note  04/04/2022 Name: Patricia Moore MRN: [id number] DOB: 10-23-1933  [name] is a 88 y.o. year old female who is a primary care patient of Drue Novel, Nolon Rod, MD. I reached out to Lynford Humphrey by phone today in response to a referral sent by Ms. Callaway M Mak's PCP.  [name] was given information about Chronic Care Management services today including:  CCM service includes personalized support from designated clinical staff supervised by the physician, including individualized plan of care and coordination with other care providers 24/7 contact phone numbers for assistance for urgent and routine care needs. Service will only be billed when office clinical staff spend 20 minutes or more in a month to coordinate care. Only one practitioner may furnish and bill the service in a calendar month. The patient may stop CCM services at amy time (effective at the end of the month) by phone call to the office staff. The patient will be responsible for cost sharing (co-pay) or up to 20% of the service fee (after annual deductible is met)  [name]  agreedto scheduling an appointment with the CCM RN Case Manager   Follow up plan: Patient agreed to scheduled appointment with RN Case Manager on 05/22/2022(date/time).   Penne Lash, RMA Care Guide [facility]  [place], [place] [address] Direct Dial: [phone] [email or web address]

## 2022-04-07 ENCOUNTER — Ambulatory Visit: Payer: Medicare Other | Attending: Nurse Practitioner | Admitting: Nurse Practitioner

## 2022-04-07 ENCOUNTER — Encounter: Payer: Self-pay | Admitting: Nurse Practitioner

## 2022-04-07 VITALS — BP 128/66 | HR 70 | Ht 61.0 in | Wt 125.0 lb

## 2022-04-07 DIAGNOSIS — I340 Nonrheumatic mitral (valve) insufficiency: Secondary | ICD-10-CM

## 2022-04-07 DIAGNOSIS — I4729 Other ventricular tachycardia: Secondary | ICD-10-CM | POA: Diagnosis not present

## 2022-04-07 DIAGNOSIS — R0789 Other chest pain: Secondary | ICD-10-CM | POA: Diagnosis not present

## 2022-04-07 DIAGNOSIS — R42 Dizziness and giddiness: Secondary | ICD-10-CM | POA: Diagnosis not present

## 2022-04-07 DIAGNOSIS — I471 Supraventricular tachycardia, unspecified: Secondary | ICD-10-CM | POA: Diagnosis not present

## 2022-04-07 DIAGNOSIS — R002 Palpitations: Secondary | ICD-10-CM | POA: Diagnosis not present

## 2022-04-07 DIAGNOSIS — Z87898 Personal history of other specified conditions: Secondary | ICD-10-CM

## 2022-04-07 DIAGNOSIS — I10 Essential (primary) hypertension: Secondary | ICD-10-CM

## 2022-04-07 NOTE — Patient Instructions (Signed)
Medication Instructions:  Your physician recommends that you continue on your current medications as directed. Please refer to the Current Medication list given to you today.   *If you need a refill on your cardiac medications before your next appointment, please call your pharmacy*   Lab Work: NONE ordered at this time of appointment   If you have labs (blood work) drawn today and your tests are completely normal, you will receive your results only by: [place] (if you have MyChart) OR A paper copy in the mail If you have any lab test that is abnormal or we need to change your treatment, we will call you to review the results.   Testing/Procedures: NONE ordered at this time of appointment     Follow-Up: At [facility], you and your health needs are our priority.  As part of our continuing mission to provide you with exceptional heart care, we have created designated Provider Care Teams.  These Care Teams include your primary Cardiologist (physician) and Advanced Practice Providers (APPs -  Physician Assistants and Nurse Practitioners) who all work together to provide you with the care you need, when you need it.  We recommend signing up for the patient portal called "MyChart".  Sign up information is provided on this After Visit Summary.  MyChart is used to connect with patients for Virtual Visits (Telemedicine).  Patients are able to view lab/test results, encounter notes, upcoming appointments, etc.  Non-urgent messages can be sent to your provider as well.   To learn more about what you can do with MyChart, go to [email or web address].    Your next appointment:   3-4 month(s)  The format for your next appointment:   In Person  Provider:   Minus Breeding, MD     Other Instructions   Important Information About Sugar

## 2022-04-07 NOTE — Progress Notes (Signed)
Office Visit    Patient Name: Patricia Moore Date of Encounter: 04/07/2022  Primary Care Provider:  Colon Branch, MD Primary Cardiologist:  Minus Breeding, MD  Chief Complaint    88 year old female with a history of syncope, palpitations, NSVT, PSVT, mitral valve regurgitation, atypical chest pain, and hypertension who presents for follow-up related to palpitations.  Past Medical History    Past Medical History:  Diagnosis Date   Anxiety 12/09/2013   Arthritis    Broken bones    face   Cataract    Diabetes mellitus    Diverticulitis    patient states has had three attacks    Diverticulosis    DJD (degenerative joint disease) 12/14/2014   Glaucoma    H. pylori infection 2001   Heart murmur    Hx of adenomatous colonic polyps    Hyperlipidemia    Hypertension    Intraductal papilloma    bx nef x 2 in the 70s-90s   Saccular aneurysm    SCC (squamous cell carcinoma) 03/2011   sees dermatology   Past Surgical History:  Procedure Laterality Date   BREAST BIOPSY  '91, '95   INTRADUCTAL PAPILLOMA   BREAST LUMPECTOMY     CATARACT EXTRACTION Bilateral 09/2014   CLOSED REDUCTION ULNAR SHAFT Right 10/06/2017   Procedure: CLOSED REDUCTION DISLOCATED RIGHT SHOULDER;  Surgeon: Mcarthur Rossetti, MD;  Location: [place];  Service: Orthopedics;  Laterality: Right;   COLONOSCOPY      Allergies  Allergies  Allergen Reactions   Ciprofloxacin Nausea And Vomiting    History of Present Illness    88 year old female with the above past medical history including syncope, palpitations, NSVT, PSVT, mitral valve regurgitation, atypical chest pain, and hypertension.  She has a history of prior syncope in 2017.  Prior to her episode she woke up feeling dizzy.  She was later treated for labyrinthitis and was started on meclizine.  Echocardiogram at the time was unremarkable. Outpatient cardiac monitor in 2018 demonstrated NSVT.  She had a negative ETT in 2020.  Has also been noted to  have brief runs of PSVT.  Most recent echocardiogram in 07/04/2021 revealed EF 55 to 60%, normal LV function, no RWMA, mild LVH of the septal segment, G2 DD, elevated LVEDP, normal RV systolic function, mildly elevated PASP, mild mitral valve regurgitation, moderate MAC. She was last seen in the office on 07/08/2021 and was stable overall from a cardiac standpoint.  She reported occasional chest discomfort at rest  that radiated through to her back, she denied any exertional symptoms (able to walk 5 miles without symptoms).  She noted occasional palpitations.  She did suffer a cervical spine fracture in October 2023 following a mechanical fall.  She presents today for follow-up.  Since her last visit she has been stable overall from a cardiac standpoint.  She is having a difficult recovery from her cervical spine fracture and is still wearing a neck brace, .  She was told she may have to wear a neck brace for up to 6 months.  She had an episode that occurred 2 days ago where she had sudden onset dizziness, nausea, back pain, cold sweats, and palpitations.  She sat down rested and her symptoms resolved after approximately 40 minutes.  She has not had any episodes or symptoms prior to this event and she has not had any since.    Home Medications    Current Outpatient Medications  Medication Sig Dispense Refill   ALPRAZolam (  XANAX) 0.25 MG tablet TAKE 1 TABLET(0.25 MG) BY MOUTH DAILY AS NEEDED FOR ANXIETY 30 tablet 1   carvedilol (COREG) 25 MG tablet TAKE 1 TABLET(25 MG) BY MOUTH TWICE DAILY WITH A MEAL 180 tablet 1   cholecalciferol (VITAMIN D) 1000 UNITS tablet Take 1,000 Units by mouth daily.     co-enzyme Q-10 30 MG capsule Take 30 mg by mouth daily.     dorzolamide-timolol (COSOPT) 22.3-6.8 MG/ML ophthalmic solution Place 1 drop into both eyes 2 (two) times daily.      Lancets (ONETOUCH ULTRASOFT) lancets TEST BLOOD SUGAR NO MORE THEN TWICE DAILY 200 each 12   metFORMIN (GLUCOPHAGE) 1000 MG tablet  TAKE 1 TABLET WITH BREAKFAST AND LUNCH THEN 1/2 TABLET AT BEDTIME 225 tablet 1   ONETOUCH ULTRA test strip CHECK BLOOD SUGAR NO MORE THAN 2 TIMES PER DAY 200 strip 12   RESTASIS 0.05 % ophthalmic emulsion      Semaglutide,0.25 or 0.5MG/DOS, 2 MG/3ML SOPN INJECT 0.5 UNDER THE SKIN ONCE A WEEK 3 mL 3   No current facility-administered medications for this visit.     Review of Systems    She denies chest pain, palpitations, dyspnea, pnd, orthopnea, n, v, dizziness, syncope, edema, weight gain, or early satiety. All other systems reviewed and are otherwise negative except as noted above.   Physical Exam    VS:  BP 128/66   Pulse 70   Ht _0  (1.549 m)   Wt 125 lb (56.7 kg)   LMP 04/17/1988   SpO2 95%   BMI 23.62 kg/m   GEN: Well nourished, well developed, in no acute distress. HEENT: normal. Neck: Supple, no JVD, carotid bruits, or masses. Cardiac: RRR, no murmurs, rubs, or gallops. No clubbing, cyanosis, edema.  Radials/DP/PT 2+ and equal bilaterally.  Respiratory:  Respirations regular and unlabored, clear to auscultation bilaterally. GI: Soft, nontender, nondistended, BS + x 4. MS: no deformity or atrophy. Skin: warm and dry, no rash. Neuro:  Strength and sensation are intact. Psych: Normal affect.  Accessory Clinical Findings    ECG personally reviewed by me today -NSR, 70 bpm, sinus arrhythmia, LVH with repol abnormality- no acute changes.   Lab Results  Component Value Date   WBC 11.2 (H) 02/01/2022   HGB 14.4 02/01/2022   HCT 42.6 02/01/2022   MCV 91.8 02/01/2022   PLT 414 (H) 02/01/2022   Lab Results  Component Value Date   CREATININE 0.86 02/01/2022   BUN 30 (H) 02/01/2022   NA 138 02/01/2022   K 4.7 02/01/2022   CL 96 (L) 02/01/2022   CO2 31 02/01/2022   Lab Results  Component Value Date   ALT 14 05/26/2021   AST 19 05/26/2021   ALKPHOS 75 10/15/2019   BILITOT 0.4 04/23/2020   Lab Results  Component Value Date   CHOL 192 05/26/2021   HDL 64.60  05/26/2021   LDLCALC 109 (H) 05/26/2021   LDLDIRECT 131.9 10/28/2010   TRIG 93.0 05/26/2021   CHOLHDL 3 05/26/2021    Lab Results  Component Value Date   HGBA1C 6.0 11/25/2021    Assessment & Plan   1. Dizziness/history of syncope: Prior syncope in 2017.  Workup including echo, outpatient monitor were essentially unremarkable (she was noted to have NSVT and PSVT).  She denies any recurrent syncope.  She had an episode of recent palpitations, dizziness as below.  Continue to monitor.   2. Palpitations/NSVT/PSVT/chest pain: ETT in 2020 was negative. Two days ago she had an  episode of sudden onset dizziness, nausea, back pain, cold sweats, and palpitations.  She rested and her symptoms spontaneously resolved after approximately 40 minutes.  She has chest pain, dyspnea, presyncope, syncope.  This was an isolated episode and she has not had any symptoms prior to this or any symptoms since.  We discussed possible workup for her symptoms including repeat heart monitor, possible ischemic evaluation.  Through shared decision making, and given advanced age, patient prefers to defer any additional workup at this time. Discussed ED precautions.  I advised her to notify us if she has recurrent symptoms.  Continue carvedilol.  3. Mitral valve regurgitation: Most recent echo in 07/04/2021 revealed EF 55 to 60%, normal LV function, no RWMA, mild LVH of the septal segment, G2 DD, elevated LVEDP, normal RV systolic function, mildly elevated PASP, mild mitral valve regurgitation, moderate MAC. Euvolemic and well compensated on exam.  Repeat echo as clinically indicated.  4. Hypertension: BP well controlled. Continue current antihypertensive regimen.   5. Disposition: Follow-up in 3 to 4 months, sooner if needed.       Lenna Sciara, NP 04/07/2022, 1:21 PM

## 2022-04-13 ENCOUNTER — Ambulatory Visit: Payer: Self-pay

## 2022-04-13 ENCOUNTER — Ambulatory Visit: Payer: Medicare Other | Admitting: Orthopedic Surgery

## 2022-04-13 DIAGNOSIS — S1201XD Stable burst fracture of first cervical vertebra, subsequent encounter for fracture with routine healing: Secondary | ICD-10-CM | POA: Diagnosis not present

## 2022-04-13 NOTE — Progress Notes (Signed)
Orthopedic Spine Surgery Office Note   Assessment: Patient is a 88 y.o. female with C1 and C2 fracture.  She is now about 12 weeks out from time of injury     Plan: -Her lateral mass displacement measures 5.13m today which is similar to prior. She had no instability on flexion/extension and fracture has not displaced on open mouth views with time, so will discontinue collar use at this time.  -She still has pain in the neck particularly on the let side. 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 The goal would be to make the pain tolerable. She should try voltaren gel over the neck. She has had success with that on her shoulder.  -She can continue to use ibuprofen and tylenol for pain relief -Will start PT to work on cervical stabilization -Patient should return to office in 4 weeks, repeat x-rays of cervical spine at next visit: none     Patient expressed understanding of the plan and all questions were answered to the patient's satisfaction.    ___________________________________________________________________________     History:   Patient is a 88y.o. female who presents today for follow up of C1 and C2 fractures.  Pain is not too bad today, but she does have it frequently and it is similar to our last visit. It is worse on the left side of the neck. She notices it is worse with flexion at the neck and with rotation. Not as bad with extension. No radicular pain into the arms. Denies paresthesias and numbness.    Treatments tried: collar use, activity modification, oxycodone, tylenol, NSAIDs     Physical Exam:   General: no acute distress, appears stated age Neurologic: alert, answering questions appropriately, following commands Respiratory: unlabored breathing on room air, symmetric chest rise Psychiatric: appropriate affect, normal cadence to speech     MSK (spine):   -Strength exam                                                    Left                  Right Grip strength                5/5                  5/5 Interosseus                  5/5                  5/5 Wrist extension            5/5                  5/5 Wrist flexion                 5/5                  5/5 Elbow flexion                5/5                  5/5 Deltoid  5/5                  4+/5               Has had chronic issues with her shoulder since an accident about 3 years ago, no new weakness      -Sensory exam                         Sensation intact to light touch in C5-T1 nerve distributions of bilateral upper extremities   -Hoffman sign: negative bilaterally   Imaging: XR of the cervical spine from 04/13/2022 was independently reviewed and interpreted, showing 5.15m lateral mass overhang on the left side at C1/2. Displaced C2 lateral mass with depression - appears similar to prior films. No lateral mass overhang on the contralateral side. Degenerative changes throughout the cervical spine.      Patient name: Patricia KORFPatient MRN: [id number] of visit: 04/13/22

## 2022-04-14 ENCOUNTER — Ambulatory Visit: Payer: Medicare Other | Admitting: Obstetrics & Gynecology

## 2022-04-14 ENCOUNTER — Ambulatory Visit: Payer: Medicare Other | Admitting: Neurology

## 2022-04-21 ENCOUNTER — Ambulatory Visit: Payer: Medicare Other | Admitting: Pharmacist

## 2022-04-21 DIAGNOSIS — S12090A Other displaced fracture of first cervical vertebra, initial encounter for closed fracture: Secondary | ICD-10-CM | POA: Insufficient documentation

## 2022-04-21 DIAGNOSIS — E119 Type 2 diabetes mellitus without complications: Secondary | ICD-10-CM

## 2022-04-21 DIAGNOSIS — Z79899 Other long term (current) drug therapy: Secondary | ICD-10-CM

## 2022-04-21 NOTE — Progress Notes (Signed)
04/21/2022 Name: Patricia Moore MRN: [id number] DOB: 06/09/1933  Chief Complaint  Patient presents with   Diabetes    Follow up    Patricia Moore is a 89 y.o. year old female who was referred for Chronic Care Management by their primary care provider, Wanda Plump, MD. They presented for a phone follow up visit today.    They were referred to the pharmacist by their PCP for assistance in managing diabetes and medication access   Subjective:  Care Team: Primary Care Provider: Wanda Plump, MD ; Next Scheduled Visit: 05/29/2022   Medication Access/Adherence/ Type 2 DM Patient had been taking Ozempic 0.5mg  weekly and metformin 2500mg  daily with good results until she reached the Medicare Coverage gap in June 2023.  She was  with samples for a few months but  when I met with her for our initial visit in December 2023 she has not taken Ozmepic in about 2 or 3 months. She reported at that time that blood glucose has increased to 150 to 200.  Provided samples of Ozmepic 0.5mg  x 4 doses on 03/30/2022. Today patient reports blood glucose has been 97, 110, 112.  We faxed 2024 medication assistance program application to [employer] for [place] on 03/30/2022.  Hyperlipidemia: noted that last LDL was 109. Patient is not taking statin. Asked if she has taken statin in past. She states she declined to take statin the past. She prefers to lower cholesterol with diet and exercise.  She avoids processed foods, eats lots of vegetables. She walks 5 miles a day 5 days per week.   Current Pharmacy:  [id number] - [place], [place] - [address] AT [facility] [address] [place] [address] Phone: [phone] Fax: [phone]  [facility] - [facility] [address] [place] [address] Phone: [phone] Fax: [phone]   Patient reports affordability concerns with their medications: Yes  Patient  reports access/transportation concerns to their pharmacy: No  Patient reports adherence concerns with their medications:  Yes  due to being in coverage gap in 2023.   Diabetes:  Current medications: Ozempic 0.5mg  weekly and metformin 2500mg  per day  Current glucose readings: 97, 110, 112.   Patient did report to cardiologist on 04/07/2022 that she has an episode of dizziness. She has not had another episode.  She did not check blood glucose when this episode occurred.  Patient denies hyperglycemic symptoms including; polyuria, polydipsia, polyphagia, nocturia, neuropathy, blurred vision.   Objective: Lab Results  Component Value Date   HGBA1C 6.0 11/25/2021    Lab Results  Component Value Date   CREATININE 0.86 02/01/2022   BUN 30 (H) 02/01/2022   NA 138 02/01/2022   K 4.7 02/01/2022   CL 96 (L) 02/01/2022   CO2 31 02/01/2022    Lab Results  Component Value Date   CHOL 192 05/26/2021   HDL 64.60 05/26/2021   LDLCALC 109 (H) 05/26/2021   LDLDIRECT 131.9 10/28/2010   TRIG 93.0 05/26/2021   CHOLHDL 3 05/26/2021    Medications Reviewed Today     Reviewed by Henrene Pastor, RPH-CPP (Pharmacist) on 04/21/22 at 1155  Med List Status: <None>   Medication Order Taking? Sig Documenting Provider Last Dose Status Informant  ALPRAZolam (XANAX) 0.25 MG tablet [id number] Yes TAKE 1 TABLET(0.25 MG) BY MOUTH DAILY AS NEEDED FOR ANXIETY Wanda Plump, MD Taking Active  carvedilol (COREG) 25 MG tablet [id number] Yes TAKE 1 TABLET(25 MG) BY MOUTH TWICE DAILY WITH A MEAL Paz, Nolon Rod, MD Taking Active   cholecalciferol (VITAMIN D) 1000 UNITS tablet [id number] Yes Take 1,000 Units by mouth daily. [provider] Taking Active Multiple Informants           Med Note (CANTER, [facility] D   Tue Oct 16, 2017  3:30 PM)    co-enzyme Q-10 30 MG capsule [id number] Yes Take 30 mg by mouth daily. [provider] Taking Active Multiple Informants           Med Note (CANTER, KAYLYN D   Tue  Oct 16, 2017  3:30 PM)    dorzolamide-timolol (COSOPT) 22.3-6.8 MG/ML ophthalmic solution [id number] Yes Place 1 drop into both eyes 2 (two) times daily.  [provider] Taking Active Multiple Informants           Med Note (CANTER, Paula Libra   Tue Oct 16, 2017  3:30 PM)    Lancets Letta Pate ULTRASOFT) lancets [id number] Yes TEST BLOOD SUGAR NO MORE THEN TWICE DAILY Wanda Plump, MD Taking Active   metFORMIN (GLUCOPHAGE) 1000 MG tablet [id number] Yes TAKE 1 TABLET WITH BREAKFAST AND LUNCH THEN 1/2 TABLET AT BEDTIME Wanda Plump, MD Taking Active   [facility] ULTRA test strip [id number] Yes CHECK BLOOD SUGAR NO MORE THAN 2 TIMES PER DAY Wanda Plump, MD Taking Active   RESTASIS 0.05 % ophthalmic emulsion [id number] Yes  [provider] Taking Active   Semaglutide,0.25 or 0.5MG /DOS, 2 MG/3ML Namon Cirri [id number] Yes INJECT 0.5 UNDER THE SKIN ONCE A WEEK Paz, Nolon Rod, MD Taking Active             SDOH Interventions Today    Flowsheet Row Most Recent Value  SDOH Interventions   Financial Strain Interventions Other (Comment)  [Difficulty affording Ozempic - applied for Novo MAP]  Physical Activity Interventions Intervention Not Indicated       Assessment/Plan:   Diabetes: - Currently controlled - Reviewed long term cardiovascular and renal outcomes of uncontrolled blood sugar - Reviewed goal A1c, goal fasting, and goal 2 hour post prandial glucose - Checked on [employer] 2024 application for medication assistance program for [employer]. Currently in benefits verification process. Novo Representative anticipates should have decision I nabout 72 hours. Patient endorses that she has 1 more dose of the sample I gave 03/30/2022 and she purchased 1 month supply at [facility] 04/18/2022 since her Medicare benefits restarted (cost was $47) - Continue Ozempic and metformin (might be able to consider lowering dose of metformin in future to 2000mg  per day) - Reviewed s/s of hypoglycemia and how to  treat.   Hyperlipidemia:  - Discussed LDL goal of < 100.  - Patient to have LDL checked 05/2022 at appt with PCP.  - Consider statin if LDL still > 100.  - Continue to exercise daily - at least 150 minutes per week and follow heart health diet.   Follow Up Plan: 1 month with Chronic Care Management nurse coordinator and Dr Drue Novel.  Clinical Pharmacist Practitioner will continue to check in medication assistance program for Ozempic. Phone follow up planned with patient in April 2024.   Henrene Pastor, PharmD Clinical Pharmacist [facility]

## 2022-04-21 NOTE — Patient Instructions (Addendum)
Mrs. Capers It was a pleasure speaking with you today.  Below is a summary of your health goals and summary of our recent visit.   Continue to take Ozempic and metformin for diabetes Continue ot check blood glucose once a day.  Home blood glucose goals  Fasting blood glucose goal (before meals) = 80 to 130 Blood glucose goal after a meal = less than 180   Remember to check blood glucose if you have any of these symptoms: dizziness, lightheadedness, feeling shaky, sweaty How to Treat a Low Glucose Level (see below for Spanish instructions)  If you have a low blood glucose less than 70, please eat / drink 15 grams of carbohydrates (4 oz of juice, soda, 4 glucose tablets, or 3-4 pieces of hard candy).  It is best so choose a "quick" source of sugar that does no contain fat (chocolate and peanut butter might take longer to increase your blood glucose) Wait 15 minutes and then recheck your blood glucose. If your blood glucose is still less than 70, eat another 15 grams of carbohydrates.  Wait another 15 minutes and recheck your glucose.  Continue this until your blood glucose is over 70. Once you blood glucose is over 70, eat a snack with protein in it to prevent your blood glucose from dropping again.    As always if you have any questions or concerns especially regarding medications, please feel free to contact me either at the phone number below or with a MyChart message.   Keep up the good work!  Cherre Robins, PharmD Clinical Pharmacist [place] [place] [phone] (direct line)  [phone] (main office number)   Patient verbalizes understanding of instructions and care plan provided today and agrees to view in [place]. Active MyChart status and patient understanding of how to access instructions and care plan via MyChart confirmed with patient.      Hipoglucemia Hypoglycemia La hipoglucemia se produce cuando el nivel de azcar (glucosa) en la sangre  es demasiado bajo. Las personas que tienen diabetes y las que no tienen la enfermedad pueden tener un nivel de Location manager en la sangre bajo. El nivel bajo de azcar en la sangre puede ocurrir rpidamente y ser Engineer, maintenance (IT). Cules son las causas? Esta afeccin ocurre con ms frecuencia en las personas que tienen diabetes. Las causas pueden ser las siguientes: Los medicamentos para la diabetes. No comer lo suficiente o no comer con la frecuencia suficiente. Aumentar la actividad fsica. Beber alcohol con el estmago vaco. Si no tiene diabetes, las causas de esta afeccin pueden deberse a lo siguiente: Un tumor en el pncreas. No comer lo suficiente o no comer durante perodos largos (ayunar). Una infeccin o enfermedad muy grave. Problemas despus de realizarse la ciruga para perder peso (baritrica). Insuficiencia renal o insuficiencia heptica. Ciertos medicamentos. Qu incrementa el riesgo? Es ms probable que Orthoptist en las personas que: Tienen diabetes y toman medicamentos para Social worker. Consumen alcohol en exceso. Tienen una enfermedad muy grave. Cules son los signos o sntomas? Leves Hambre. Sudoracin y piel hmeda. Sentirse mareado o aturdido. Tener somnolencia o dificultad para dormir. Sensacin de que va a vomitar (nuseas). Latidos cardacos acelerados. Dolor de [place]. Visin borrosa. Cambios de humor, por ejemplo: Estar malhumorado. Sentirse preocupado o nervioso (ansioso). Hormigueo o falta de sensibilidad (entumecimiento) alrededor de la boca, los labios o la [place]. Moderado Confusin y deterioro del sentido de la realidad. Cambios en el comportamiento. Debilidad.  Latidos cardacos irregulares. Dificultad para moverse (coordinacin). Muy bajo Si el nivel de azcar en la sangre es muy bajo (hipoglucemia grave), eso es una emergencia mdica. Puede ocasionar: Desmayos. Convulsiones. Prdida de la conciencia (estado de  coma). Muerte. Cmo se trata? Tratamiento del nivel bajo de azcar en la sangre [place], el tratamiento de un nivel bajo de azcar en la sangre consiste en ingerir o beber de inmediato algo que Paediatric nurse. El alimento o bebida debe contener 15 gramos de un hidrato de carbono (carbohidrato) de accin rpida. [place] opciones se incluyen las siguientes: 4 onzas (120 ml) de jugo de frutas. 4 onzas (120 ml) de un refresco comn (no diettico). Algunos caramelos duros. Lea las etiquetas de informacin nutricional a fin de ver cuntas unidades consumir para obtener 15 gramos. 1 cucharada (15 ml) de azcar o miel. 4 tabletas de glucosa. 1 pomo de glucosa en gel. Tratamiento del nivel bajo de azcar en la sangre si tiene diabetes Si puede pensar con claridad y tragar de manera segura, siga la regla 15/15, que consiste en lo siguiente: Consuma 15 gramos de un hidrato de carbono de accin rpida. Hable con su mdico acerca de cunto debera consumir. Lleve siempre consigo una fuente de hidratos de carbono de accin rpida, por ejemplo: Comprimidos de glucosa (tome 4 comprimidos). Algunos caramelos duros. Lea las etiquetas de informacin nutricional a fin de ver cuntas unidades consumir para obtener 15 gramos. 4 onzas (120 ml) de jugo de frutas. 4 onzas (120 ml) de un refresco comn (no diettico). 1 cucharada (15 ml) de miel o azcar. 1 pomo de glucosa en gel. Contrlese el nivel de azcar en la sangre 15 minutos despus de ingerir el hidrato de carbono. Si el nivel de azcar en la sangre todava es igual o menor que 70 mg/dl (3.9 mmol/l), ingiera nuevamente 15 gramos de un hidrato de carbono. Si el nivel de azcar en la sangre no supera los 70 mg/dl (3.9 mmol/l) despus de 3 intentos, solicite ayuda de inmediato. Ingiera una comida o una colacin en el transcurso de 1 hora despus de que el nivel de azcar en la sangre se haya normalizado.  Tratamiento del nivel muy bajo de azcar en  la sangre Si su nivel de azcar en la sangre es menor que 54 mg/dl (3 mmol/l), significa que est muy bajo o tiene hipoglucemia grave. Esto es Engineer, maintenance (IT). Solicite atencin mdica de inmediato. Si su nivel de azcar en la sangre es muy bajo y no puede ingerir ningn alimento ni bebida, tal vez deban darle una hormona llamada glucagn. Un familiar o un amigo deben aprender a controlarle el azcar en la sangre y a darle el glucagn. Pregntele al mdico si debe tener un kit de glucagn de Freight forwarder en su casa. Un nivel muy bajo de azcar en la sangre tambin puede necesitar tratamiento en un hospital. Siga estas instrucciones en su casa: Indicaciones generales Use los medicamentos de venta libre y los recetados solamente como se lo haya indicado el mdico. Sepa cul es su nivel de azcar en la sangre como se lo haya indicado el mdico. Si bebe alcohol: Limite la cantidad que bebe a lo siguiente: De 0 a 1 medida por da para las mujeres que no estn embarazadas. De 0 a 2 medidas por da para los hombres. Sepa cunta cantidad de alcohol hay en las bebidas que toma. En los [place], una medida equivale a una botella de cerveza de 12 oz (355 ml), un vaso de  vino de 5 oz (148 ml) o un vaso de una bebida alcohlica de alta graduacin de 1 oz (44 ml). Asegrese de ingerir alimentos cuando beba alcohol. Sepa que su cuerpo absorbe el alcohol rpidamente. Esto puede provocar ms tarde un nivel bajo de azcar en la sangre. Asegrese de seguir controlando su Dispensing optician. Concurra a [place]. Si usted tiene diabetes:  Lleve siempre con usted un hidrato de carbono de accin rpida (15 gramos) para tratar el nivel bajo de azcar en la sangre. Siga el plan de atencin de la diabetes como se lo haya indicado el mdico. Asegrese de hacer lo siguiente: Conozca los sntomas de nivel bajo de Dispensing optician. Contrlese el nivel de azcar en la sangre con la frecuencia  que le hayan indicado. Contrleselo siempre antes y despus de Engineer, site. Controle siempre su nivel de azcar en la sangre antes de conducir. Tome los medicamentos segn las indicaciones. Siga su plan de comidas. Coma a horario. No omita comidas. Comparta su plan de atencin de la diabetes con: Sus compaeros de trabajo o de la escuela. Las personas con las que [place]. Lleve consigo una tarjeta, o use un brazalete o una medalla que indique que tiene diabetes. Dnde buscar ms informacin American Diabetes Association (Asociacin Estadounidense de la Diabetes): www.diabetes.org Comunquese con un mdico si: Tiene problemas para Advertising account executive de azcar en la sangre dentro del rango indicado. Tiene un nivel de azcar en la sangre bajo con frecuencia. Solicite ayuda de inmediato si: Todava tiene sntomas despus de comer o beber algo que contenga 15 gramos de hidratos de carbono de accin rpida y no puede hacer que su azcar en la sangre supere los 70 mg/dl siguiendo la regla 15/15. Su nivel de azcar en la sangre es inferior a 54 mg/dl (3 mmol/l). Tiene una convulsin. Se desmaya. Estos sntomas pueden Sales executive. Solicite ayuda de inmediato. Comunquese con el servicio de emergencias de su localidad (911 en los [place]). No espere a ver si los sntomas desaparecen. No conduzca por sus propios medios [employer] hospital. Resumen La hipoglucemia sucede cuando el nivel de azcar (glucosa) en la sangre es demasiado bajo. Las personas que tienen diabetes y las que no tienen la enfermedad pueden tener un nivel de Location manager en la sangre bajo. El nivel bajo de azcar en la sangre puede ocurrir rpidamente y ser Engineer, maintenance (IT). Asegrese de [employer] sntomas de un nivel bajo de azcar en la sangre y saber cmo tratarlo. Lleve siempre consigo una fuente de azcar (hidratos de carbono de accin rpida) para tratar un nivel bajo de azcar en la sangre. Esta informacin no  tiene Marine scientist el consejo del mdico. Asegrese de hacerle al mdico cualquier pregunta que tenga. Document Revised: 03/25/2020 Document Reviewed: 03/25/2020 Elsevier Patient Education  [place].

## 2022-05-08 DIAGNOSIS — S12090G Other displaced fracture of first cervical vertebra, subsequent encounter for fracture with delayed healing: Secondary | ICD-10-CM | POA: Diagnosis not present

## 2022-05-10 ENCOUNTER — Telehealth: Payer: Self-pay

## 2022-05-10 ENCOUNTER — Telehealth: Payer: Self-pay | Admitting: Internal Medicine

## 2022-05-10 MED ORDER — ALPRAZOLAM 0.25 MG PO TABS: ORAL_TABLET | ORAL | 1 refills | Status: DC

## 2022-05-10 NOTE — Telephone Encounter (Signed)
Pt stating is needing refill for ALPRAZolam Duanne Moron) 0.25 MG tablet  sent to [id number] - [place], [place] - [address] [place], [place] [address] Phone: [phone]  Fax: [phone] DEA #: [id number] . Please advise.

## 2022-05-10 NOTE — Telephone Encounter (Signed)
Requesting: alprazolam 0.'25mg'$   Contract:05/26/21 UDS:11/23/20 Last Visit: 04/21/22 Next Visit:05/29/22 Last Refill:01/24/22 #30 and 1RF  Please Advise

## 2022-05-10 NOTE — Telephone Encounter (Signed)
Spoke w Pt- informed Ozempic Pt assistance supply has arrived. Pt verbalized understanding.

## 2022-05-10 NOTE — Telephone Encounter (Signed)
PDMP okay, Rx sent 

## 2022-05-11 ENCOUNTER — Ambulatory Visit: Payer: Medicare Other | Admitting: Orthopedic Surgery

## 2022-05-11 DIAGNOSIS — M542 Cervicalgia: Secondary | ICD-10-CM

## 2022-05-11 NOTE — Progress Notes (Signed)
Orthopedic Spine Surgery Office Note   Assessment: Patient is a 89 y.o. female with C1 and C2 fracture. She is now about 16 weeks out from time of injury     Plan: -No acute operative intervention -She is still having pain in her neck. 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 I explained that surgery would be more complicated at this point because it likely has scarred in place. The goal would be to make her pain more tolerable and this may involve pain management. I am getting an MRI to assess her pain and fracture. If I feel she could benefit from surgery, we may discuss that as an option as well. -Will consider injection to shoulder and neck at next visit depending on what the MRI shows -She can continue to use ibuprofen and tylenol for pain relief -Patient should return to office in 5 weeks, repeat x-rays of cervical spine at next visit: AP/open mouth/flex/ex cervical     Patient expressed understanding of the plan and all questions were answered to the patient's satisfaction.    ___________________________________________________________________________     History:   Patient is a 89 y.o. female who presents today for follow up of C1 and C2 fractures. Pain waxes and wanes in severity. Today is a bad day. Having severe pain with any neck movement. Yesterday was a better day and her pain was tolerable. There does not appear to be any particular motion or activity that makes it worse. Having some right shoulder pain today, but no other pain into the upper extremities. Denies paresthesias and numbness. No change in bowel or bladder habits. No hand dexterity issues.    Treatments tried: collar use, activity modification, oxycodone, tylenol, NSAIDs     Physical Exam:   General: no acute distress, appears stated age Neurologic: alert, answering questions appropriately, following commands Respiratory: unlabored  breathing on room air, symmetric chest rise Psychiatric: appropriate affect, normal cadence to speech     MSK (spine):   -Strength exam                                                   Left                  Right Grip strength                5/5                  5/5 Interosseus                  5/5                  5/5 Wrist extension            5/5                  5/5 Wrist flexion                 5/5                  5/5 Elbow flexion                5/5  5/5 Deltoid                          5/5                  4+/5               Has had chronic issues with her shoulder since an accident about 3 years ago, no new weakness      -Sensory exam                         Sensation intact to light touch in C5-T1 nerve distributions of bilateral upper extremities   -Hoffman sign: negative bilaterally  -Right shoulder: pain with jobe test, negative drop arm sign, no weakness with external rotation with arm at side, negative belly press, positive hawkins   Imaging: XR of the cervical spine from 04/13/2022 was previously independently reviewed and interpreted, showing 5.61m lateral mass overhang on the left side at C1/2. Displaced C2 lateral mass with depression - appears similar to prior films. No lateral mass overhang on the contralateral side. Degenerative changes throughout the cervical spine.      Patient name: Patricia BYRUMPatient MRN: [id number] of visit: 05/11/22

## 2022-05-22 ENCOUNTER — Telehealth: Payer: Self-pay

## 2022-05-22 ENCOUNTER — Telehealth: Payer: Medicare Other

## 2022-05-22 NOTE — Telephone Encounter (Signed)
CCM RN Visit Note   05/22/22 Name: Patricia Moore MRN: [id number]      DOB: [birth year]  Subjective: Patricia Moore is a 89 y.o. year old female who is a primary care patient of Wanda Plump, MD. The patient was referred to the Chronic Care Management team for assistance with care management needs subsequent to provider initiation of CCM services and plan of care.      An unsuccessful outreach attempt was made today for a scheduled CCM visit.   PLAN: A HIPAA compliant phone message was left for the patient providing contact information and requesting a return call.   Katha Cabal RN Care Manager/Chronic Care Management [phone]

## 2022-05-29 ENCOUNTER — Ambulatory Visit: Payer: Medicare Other | Admitting: Internal Medicine

## 2022-05-30 ENCOUNTER — Telehealth: Payer: Self-pay

## 2022-05-30 NOTE — Progress Notes (Signed)
Chronic Care Management Note  05/30/2022 Name: [name] MRN: [id number] DOB: May 11, 1933  Patricia Moore is a 89 y.o. year old female who is a primary care patient of Wanda Plump, MD and is actively engaged with the Chronic Care Management team. I reached out to Lynford Humphrey by phone today to assist with re-scheduling an initial visit with the RN Case Manager  Follow up plan: Unsuccessful telephone outreach attempt made. A HIPAA compliant phone message was left for the patient providing contact information and requesting a return call.  The care management team will reach out to the patient again over the next 7 days.  If patient returns call to provider office, please advise to call CCM Care Guide Agnes Lawrence at [phone]. Agnes Lawrence, CMA

## 2022-06-01 ENCOUNTER — Ambulatory Visit
Admission: RE | Admit: 2022-06-01 | Discharge: 2022-06-01 | Disposition: A | Discharge status: Home / Self Care | Payer: Medicare Other | Source: Ambulatory Visit | Attending: Orthopedic Surgery | Admitting: Orthopedic Surgery

## 2022-06-01 DIAGNOSIS — M4802 Spinal stenosis, cervical region: Secondary | ICD-10-CM | POA: Diagnosis not present

## 2022-06-01 DIAGNOSIS — M542 Cervicalgia: Secondary | ICD-10-CM

## 2022-06-01 DIAGNOSIS — M4312 Spondylolisthesis, cervical region: Secondary | ICD-10-CM | POA: Diagnosis not present

## 2022-06-02 ENCOUNTER — Other Ambulatory Visit: Payer: Self-pay | Admitting: Internal Medicine

## 2022-06-15 ENCOUNTER — Ambulatory Visit: Payer: Medicare Other | Admitting: Orthopedic Surgery

## 2022-06-15 ENCOUNTER — Ambulatory Visit (INDEPENDENT_AMBULATORY_CARE_PROVIDER_SITE_OTHER): Payer: Medicare Other

## 2022-06-15 DIAGNOSIS — S1201XD Stable burst fracture of first cervical vertebra, subsequent encounter for fracture with routine healing: Secondary | ICD-10-CM

## 2022-06-15 MED ORDER — TRAMADOL HCL 50 MG PO TABS: 50.0000 mg | ORAL_TABLET | Freq: Four times a day (QID) | ORAL | 0 refills | Status: AC | PRN

## 2022-06-15 NOTE — Progress Notes (Signed)
Orthopedic Spine Surgery Office Note   Assessment: Patient is a 89 y.o. female with C1 and C2 fracture. 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  I told her a CT scan would better evaluate for nonunion.  I explained that her options at this point would be injection to help with the pain and see how much relief that gets and for how long, pain medication to help with the severe pain to make it more tolerable, or surgery.  She expressed that she is not interested in surgery at any time.  After discussing injection versus medications to manage her pain, she wanted to use the medications.  A new prescription of tramadol was provided to her today since that takes away the severe pain.  I told her I would continue following we may change medications that she uses to control her pain over time to try to improve her quality of life.  -Patient should return to office in 6 weeks, repeat x-rays of cervical spine at next visit: none     Patient expressed understanding of the plan and all questions were answered to the patient's satisfaction.    ___________________________________________________________________________     History:   Patient is a 89 y.o. female who presents today for follow up of C1 and C2 fractures.  Patient still having good and bad days in terms of pain in her neck.  She feels pain with motion through the neck especially rotation.  Pain does get better if she keeps her neck in a stable position.  She does get relief of some of the severe pain with tramadol.  She is not having pain radiating into the extremities.  No bowel or bladder incontinence.  No trouble with fine motor skills in the hands.  No new unsteadiness.   Treatments tried: collar use, activity modification, oxycodone, tylenol, NSAIDs     Physical Exam:   General: no acute  distress, appears stated age Neurologic: alert, answering questions appropriately, following commands Respiratory: unlabored breathing on room air, symmetric chest rise Psychiatric: appropriate affect, normal cadence to speech     MSK (spine):   -Strength exam                                                   Left                  Right Grip strength                5/5                  5/5 Interosseus                  5/5                  5/5 Wrist extension            5/5                  5/5 Wrist flexion                 5/5  5/5 Elbow flexion                5/5                  5/5 Deltoid                          5/5                  4/5               Has had chronic issues with her shoulder since an accident about 3 years ago, no new weakness      -Sensory exam                         Sensation intact to light touch in C5-T1 nerve distributions of bilateral upper extremities   -Hoffman sign: negative bilaterally -Negative grip and release test   Imaging: XR of the cervical spine from 06/15/2022 was independently reviewed and interpreted, showing ~101m lateral mass overhang on the left side at C1/2. Displaced C2 lateral mass with depression. Decreased joint space at C1/2 on the left. No lateral mass overhang on the contralateral side. Degenerative changes throughout the cervical spine.   MRI of the cervical spine from 06/01/2022 was independently reviewed and interpreted showing left-sided C1 and C2 vertebral fractures.  There is faint edema in the C1 ring.  Central stenosis at C5-6 and C6-7.  Foraminal stenosis on the left at C3/4, bilateral at C5/6.  No new fractures seen.  No T2 cord signal change.   Patient name: Patricia FENNOPatient MRN: [id number] of visit: 06/15/22

## 2022-06-17 ENCOUNTER — Other Ambulatory Visit: Payer: Self-pay | Admitting: Internal Medicine

## 2022-06-19 ENCOUNTER — Telehealth: Payer: Self-pay

## 2022-06-19 DIAGNOSIS — H532 Diplopia: Secondary | ICD-10-CM | POA: Diagnosis not present

## 2022-06-19 DIAGNOSIS — H02051 Trichiasis without entropian right upper eyelid: Secondary | ICD-10-CM | POA: Diagnosis not present

## 2022-06-19 DIAGNOSIS — H401131 Primary open-angle glaucoma, bilateral, mild stage: Secondary | ICD-10-CM | POA: Diagnosis not present

## 2022-06-19 NOTE — Progress Notes (Signed)
Chronic Care Management Note  06/19/2022 Name: Patricia Moore MRN: [id number] DOB: Jun 03, 1933  Patricia Moore is a 89 y.o. year old female who is a primary care patient of Wanda Plump, MD and is actively engaged with the Chronic Care Management team. I reached out to Lynford Humphrey by phone today to assist with re-scheduling an initial visit with the RN Case Manager  Follow up plan: Patient declines further follow up and engagement by the care management team. Appropriate care team members and provider have been notified via electronic communication.   Penne Lash, RMA Care Guide [facility]  [place], [place] [address] Direct Dial: [phone] [email or web address]

## 2022-06-20 ENCOUNTER — Encounter: Payer: Self-pay | Admitting: Cardiology

## 2022-06-21 ENCOUNTER — Ambulatory Visit: Payer: Medicare Other | Admitting: Obstetrics & Gynecology
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
## 2022-06-26 ENCOUNTER — Encounter: Payer: Self-pay | Admitting: Neurology

## 2022-06-26 ENCOUNTER — Ambulatory Visit: Payer: Medicare Other | Admitting: Neurology

## 2022-06-26 VITALS — BP 139/73 | HR 72 | Ht 61.0 in | Wt 130.6 lb

## 2022-06-26 DIAGNOSIS — E1142 Type 2 diabetes mellitus with diabetic polyneuropathy: Secondary | ICD-10-CM

## 2022-06-26 DIAGNOSIS — M4722 Other spondylosis with radiculopathy, cervical region: Secondary | ICD-10-CM

## 2022-06-26 DIAGNOSIS — I671 Cerebral aneurysm, nonruptured: Secondary | ICD-10-CM

## 2022-06-26 DIAGNOSIS — S12090D Other displaced fracture of first cervical vertebra, subsequent encounter for fracture with routine healing: Secondary | ICD-10-CM | POA: Diagnosis not present

## 2022-07-03 ENCOUNTER — Ambulatory Visit: Payer: Medicare Other | Admitting: Cardiology

## 2022-07-04 ENCOUNTER — Ambulatory Visit: Payer: Medicare Other | Admitting: Cardiology

## 2022-07-11 ENCOUNTER — Other Ambulatory Visit: Payer: Self-pay | Admitting: Neurosurgery

## 2022-07-11 DIAGNOSIS — S12090G Other displaced fracture of first cervical vertebra, subsequent encounter for fracture with delayed healing: Secondary | ICD-10-CM

## 2022-07-23 NOTE — Progress Notes (Unsigned)
Cardiology Office Note:   Date:  07/23/2022  ID:  Patricia Moore, DOB 12-Sep-1933, MRN [id number]  History of Present Illness:   Patricia Moore is a 89 y.o. female who presents for follow up of a syncope.   This happened on February 2017.  She woke up and she felt dizzy.  She has been treated with meclizine.   She has been treated for labyrinthitis as well.  She had a normal echo.  She did have monitor in 2018 that demonstrated NSVT.    She had a negative POET (Plain Old Exercise Treadmill).  She has had brief runs of PSVT.   ***  ***  Since I last saw her she continues to have occasional chest discomfort going through to her back.  This seems to happen more at night.  She might sit up.  It might be associated with palpitations.  However, under careful questioning it is really not different than what she had when she had her last stress test.  She is able to walk 5 miles at a time without bringing on any symptoms.  She might get occasional palpitations lying on her left side.  She had documented SVT as above but she thinks her palpitations are actually less than before.  She gets some shoulder pains and joint pains.  She uses Voltaren cream.  She has not had any frank syncope.  She is not having any PND or orthopnea.  ROS: ***  Studies Reviewed:    EKG:  ***  ***  Risk Assessment/Calculations:     No BP recorded.  {Refresh Note OR Click here to enter BP  :1}***        Physical Exam:   VS:  LMP 04/17/1988    Wt Readings from Last 3 Encounters:  06/26/22 130 lb 9.6 oz (59.2 kg)  04/07/22 125 lb (56.7 kg)  02/01/22 124 lb (56.2 kg)     GEN: Well nourished, well developed in no acute distress NECK: No JVD; No carotid bruits CARDIAC: ***RRR, no murmurs, rubs, gallops RESPIRATORY:  Clear to auscultation without rales, wheezing or rhonchi  ABDOMEN: Soft, non-tender, non-distended EXTREMITIES:  No edema; No deformity   ASSESSMENT AND PLAN:   Dizziness/history of syncope: Prior syncope in  2017.  Workup including echo, outpatient monitor were essentially unremarkable (she was noted to have NSVT and PSVT).  ***  She denies any recurrent syncope.  She had an episode of recent palpitations, dizziness as below.  Continue to monitor.   Palpitations/NSVT/PSVT/chest pain: ETT in 2020 was negative.  **** Two days ago she had an episode of sudden onset dizziness, nausea, back pain, cold sweats, and palpitations.  She rested and her symptoms spontaneously resolved after approximately 40 minutes.  She has chest pain, dyspnea, presyncope, syncope.  This was an isolated episode and she has not had any symptoms prior to this or any symptoms since.  We discussed possible workup for her symptoms including repeat heart monitor, possible ischemic evaluation.  Through shared decision making, and given advanced age, patient prefers to defer any additional workup at this time. Discussed ED precautions.  I advised her to notify us if she has recurrent symptoms.  Continue carvedilol.   Mitral valve regurgitation: Most recent echo in 07/04/2021 revealed EF 55 to 60%, normal LV function, no RWMA, mild LVH of the septal segment, G2 DD, elevated LVEDP, normal RV systolic function, mildly elevated PASP, mild mitral valve regurgitation, moderate MAC. ***  Euvolemic and well compensated on exam.  Repeat echo as clinically indicated.   Hypertension: BP ***  well controlled. Continue current antihypertensive regimen.      {Are you ordering a CV Procedure (e.g. stress test, cath, DCCV, TEE, etc)?   Press F2        :[id number]}   Signed, Rollene Rotunda, MD

## 2022-07-24 ENCOUNTER — Ambulatory Visit: Payer: Medicare Other | Attending: Cardiology | Admitting: Cardiology

## 2022-07-24 ENCOUNTER — Encounter: Payer: Self-pay | Admitting: Cardiology

## 2022-07-24 VITALS — BP 142/64 | HR 86 | Ht 61.0 in | Wt 136.2 lb

## 2022-07-24 DIAGNOSIS — I340 Nonrheumatic mitral (valve) insufficiency: Secondary | ICD-10-CM | POA: Diagnosis not present

## 2022-07-24 DIAGNOSIS — I4729 Other ventricular tachycardia: Secondary | ICD-10-CM

## 2022-07-24 DIAGNOSIS — R0789 Other chest pain: Secondary | ICD-10-CM | POA: Diagnosis not present

## 2022-07-24 DIAGNOSIS — I471 Supraventricular tachycardia, unspecified: Secondary | ICD-10-CM

## 2022-07-24 DIAGNOSIS — I10 Essential (primary) hypertension: Secondary | ICD-10-CM | POA: Diagnosis not present

## 2022-07-24 NOTE — Patient Instructions (Addendum)
Medication Instructions:  Your physician recommends that you continue on your current medications as directed. Please refer to the Current Medication list given to you today.  *If you need a refill on your cardiac medications before your next appointment, please call your pharmacy*   Testing/Procedures: See below   Follow-Up: At [facility], you and your health needs are our priority.  As part of our continuing mission to provide you with exceptional heart care, we have created designated Provider Care Teams.  These Care Teams include your primary Cardiologist (physician) and Advanced Practice Providers (APPs -  Physician Assistants and Nurse Practitioners) who all work together to provide you with the care you need, when you need it.  We recommend signing up for the patient portal called "MyChart".  Sign up information is provided on this After Visit Summary.  MyChart is used to connect with patients for Virtual Visits (Telemedicine).  Patients are able to view lab/test results, encounter notes, upcoming appointments, etc.  Non-urgent messages can be sent to your provider as well.   To learn more about what you can do with MyChart, go to [email or web address].    Your next appointment:   3 month(s)  Provider:   Rollene Rotunda, MD     Other Instructions How to Prepare for Your Cardiac PET/CT Stress Test:  1. Please do not take these medications before your test:   Medications that may interfere with the cardiac pharmacological stress agent (ex. nitrates - including erectile dysfunction medications, isosorbide mononitrate, tamulosin or beta-blockers) the day of the exam. (Erectile dysfunction medication should be held for at least 72 hrs prior to test) Hold carvedilol Theophylline containing medications for 12 hours. Dipyridamole 48 hours prior to the test. Your remaining medications may be taken with water.  2. Nothing to eat or drink, except water, 3 hours prior to  arrival time.   NO caffeine/decaffeinated products, or chocolate 12 hours prior to arrival.  3. NO perfume, cologne or lotion  4. Total time is 1 to 2 hours; you may want to bring reading material for the waiting time.  5. Please report to Radiology at the [facility] Main Entrance 30 minutes early for your test.  [address] [place], [place] [address]  Diabetic Preparation:  Hold oral medications. You may take NPH and Lantus insulin. Do not take Humalog or Humulin R (Regular Insulin) the day of your test. Check blood sugars prior to leaving the house. If able to eat breakfast prior to 3 hour fasting, you may take all medications, including your insulin, Do not worry if you miss your breakfast dose of insulin - start at your next meal.  IF YOU THINK YOU MAY BE PREGNANT, OR ARE NURSING PLEASE INFORM THE TECHNOLOGIST.  In preparation for your appointment, medication and supplies will be purchased.  Appointment availability is limited, so if you need to cancel or reschedule, please call the Radiology Department at [phone]  24 hours in advance to avoid a cancellation fee of $100.00  What to Expect After you Arrive:  Once you arrive and check in for your appointment, you will be taken to a preparation room within the Radiology Department.  A technologist or Nurse will obtain your medical history, verify that you are correctly prepped for the exam, and explain the procedure.  Afterwards,  an IV will be started in your arm and electrodes will be placed on your skin for EKG monitoring during the stress portion of the exam. Then you  will be escorted to the PET/CT scanner.  There, staff will get you positioned on the scanner and obtain a blood pressure and EKG.  During the exam, you will continue to be connected to the EKG and blood pressure machines.  A small, safe amount of a radioactive tracer will be injected in your IV to obtain a series of pictures of your heart along with an  injection of a stress agent.    After your Exam:  It is recommended that you eat a meal and drink a caffeinated beverage to counter act any effects of the stress agent.  Drink plenty of fluids for the remainder of the day and urinate frequently for the first couple of hours after the exam.  Your doctor will inform you of your test results within 7-10 business days.  For questions about your test or how to prepare for your test, please call: Rockwell Alexandria, Cardiac Imaging Nurse Navigator  Larey Brick, Cardiac Imaging Nurse Navigator Office: [phone]

## 2022-07-25 ENCOUNTER — Other Ambulatory Visit: Payer: Self-pay

## 2022-07-25 DIAGNOSIS — R0789 Other chest pain: Secondary | ICD-10-CM

## 2022-07-27 ENCOUNTER — Encounter: Payer: Self-pay | Admitting: Internal Medicine

## 2022-07-27 ENCOUNTER — Ambulatory Visit: Payer: Medicare Other | Admitting: Orthopedic Surgery

## 2022-07-27 DIAGNOSIS — S12000K Unspecified displaced fracture of first cervical vertebra, subsequent encounter for fracture with nonunion: Secondary | ICD-10-CM

## 2022-07-27 MED ORDER — TRAMADOL HCL 50 MG PO TABS: 50.0000 mg | ORAL_TABLET | Freq: Four times a day (QID) | ORAL | 0 refills | Status: DC | PRN
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
## 2022-08-11 ENCOUNTER — Ambulatory Visit: Payer: Medicare Other | Admitting: Obstetrics & Gynecology

## 2022-08-11 ENCOUNTER — Telehealth: Payer: Self-pay

## 2022-08-11 NOTE — Telephone Encounter (Signed)
Informed Pt assistance supply for Ozempic has arrived in office, ready for pick up at her convenience. Pt verbalized understanding.

## 2022-08-18 ENCOUNTER — Ambulatory Visit
Admission: RE | Admit: 2022-08-18 | Discharge: 2022-08-18 | Disposition: A | Discharge status: Home / Self Care | Payer: Medicare Other | Source: Ambulatory Visit | Attending: Neurosurgery | Admitting: Neurosurgery

## 2022-08-18 DIAGNOSIS — S12090G Other displaced fracture of first cervical vertebra, subsequent encounter for fracture with delayed healing: Secondary | ICD-10-CM

## 2022-08-18 DIAGNOSIS — S22019G Unspecified fracture of first thoracic vertebra, subsequent encounter for fracture with delayed healing: Secondary | ICD-10-CM | POA: Diagnosis not present

## 2022-08-27 ENCOUNTER — Other Ambulatory Visit: Payer: Self-pay | Admitting: Internal Medicine

## 2022-08-28 ENCOUNTER — Other Ambulatory Visit: Payer: Self-pay | Admitting: Orthopedic Surgery

## 2022-09-05 ENCOUNTER — Other Ambulatory Visit: Payer: Self-pay | Admitting: Internal Medicine

## 2022-09-07 ENCOUNTER — Other Ambulatory Visit (INDEPENDENT_AMBULATORY_CARE_PROVIDER_SITE_OTHER): Payer: Medicare Other

## 2022-09-07 ENCOUNTER — Ambulatory Visit: Payer: Medicare Other | Admitting: Orthopedic Surgery

## 2022-09-07 DIAGNOSIS — M25511 Pain in right shoulder: Secondary | ICD-10-CM

## 2022-09-07 DIAGNOSIS — G8929 Other chronic pain: Secondary | ICD-10-CM | POA: Diagnosis not present

## 2022-09-07 MED ORDER — TRAMADOL HCL 50 MG PO TABS: 50.0000 mg | ORAL_TABLET | Freq: Four times a day (QID) | ORAL | 0 refills | Status: AC | PRN

## 2022-09-07 NOTE — Progress Notes (Signed)
Orthopedic Spine Surgery Office Note   Assessment: Patient is a 89 y.o. female with two issues:  1) chronic neck pain after a C1 and C2 fracture 2) chronic right shoulder pain with progression     Plan: -Recommended use of tramadol and tylenol for her shoulder. Tramadol was prescribed to her today -If she is not doing any better in six weeks, will order MRI of her right shoulder to evaluate the rotator cuff -She is not interested in any surgery on her neck so will continue to monitor. She has less pain/discomfort with a soft collar on so I told her she can use that as needed -Patient should return to office in 6 weeks, x-rays at next visit: none     Patient expressed understanding of the plan and all questions were answered to the patient's satisfaction.    ___________________________________________________________________________     History:   Patient is a 89 y.o. female who presents today for follow up of C1 and C2 fractures. She is still having significant neck pain. She feels it with motion in her neck particularly rotation. Her pain is better when she is using a soft collar. She does not have any pain radiating into her upper extremities. Denies paresthesias and numbness. No hand dexterity issues or unsteadiness with gait. No recent changes in her pain.  She also comes into talk about her right shoulder. She sustained a right shoulder dislocation in 2019. She underwent closed reduction with Dr. Magnus Ivan. She never had any other procedures on her shoulder. She went on to develop chronic pain in the right shoulder. It has gotten progressively worse within the last year. She has trouble doing any overhead activities with the arm due to pain and weakness. She feels crunching and popping in the shoulder as well.    Treatments tried: collar use, activity modification, oxycodone, tylenol, NSAIDs, tramadol     Physical Exam:   General: no acute distress, appears stated age Neurologic:  alert, answering questions appropriately, following commands Respiratory: unlabored breathing on room air, symmetric chest rise Psychiatric: appropriate affect, normal cadence to speech     MSK (spine):   -Strength exam                                                   Left                  Right Grip strength                5/5                  5/5 Interosseus                  5/5                  5/5 Wrist extension            5/5                  5/5 Wrist flexion                 5/5                  5/5 Elbow flexion                5/5  5/5 Deltoid                          5/5                  4/5     -Sensory exam                         Sensation intact to light touch in C5-T1 nerve distributions of bilateral upper extremities   -Hoffman sign: negative bilaterally -Negative grip and release test   Right shoulder exam: no pain through passive range of motion, pain and weakness with jobe testing, weakness with external rotation with arm at side when compared to contralateral side, negative belly press  Imaging: XR of the cervical spine from 06/15/2022 was previously independently reviewed and interpreted, showing ~11mm lateral mass overhang on the left side at C1/2. Displaced C2 lateral mass with depression. Decreased joint space at C1/2 on the left. No lateral mass overhang on the contralateral side. Degenerative changes throughout the cervical spine.   XR of the right shoulder from 09/07/2022 was independently reviewed and interpreted, showing high riding humeral head. Humeral head appears subluxated but not dislocated on the axillary image. No fracture seen. Significant AC joint arthrosis.     Patient name: Patricia Moore Patient MRN: [id number] Date of visit: 09/07/22

## 2022-09-08 ENCOUNTER — Telehealth (HOSPITAL_COMMUNITY): Payer: Self-pay | Admitting: *Deleted

## 2022-09-08 NOTE — Telephone Encounter (Signed)
Reaching out to patient (with Spanish interpreter, Sophia ID [phone]) to offer assistance regarding upcoming cardiac imaging study; pt verbalizes understanding of appt date/time, parking situation and where to check in, pre-test NPO status, and verified current allergies; name and call back number provided for further questions should they arise  Larey Brick RN Navigator Cardiac Imaging [facility] [phone] office [phone] cell  Patient aware to avoid caffeine 12 hours prior to her cardiac PET scan.

## 2022-09-12 ENCOUNTER — Telehealth (HOSPITAL_COMMUNITY): Payer: Self-pay | Admitting: *Deleted

## 2022-09-12 ENCOUNTER — Encounter (HOSPITAL_COMMUNITY)
Admission: RE | Admit: 2022-09-12 | Discharge: 2022-09-12 | Disposition: A | Discharge status: Home / Self Care | Payer: Medicare Other | Source: Ambulatory Visit | Attending: Cardiology | Admitting: Cardiology

## 2022-09-12 DIAGNOSIS — R0789 Other chest pain: Secondary | ICD-10-CM | POA: Insufficient documentation

## 2022-09-12 DIAGNOSIS — I340 Nonrheumatic mitral (valve) insufficiency: Secondary | ICD-10-CM | POA: Insufficient documentation

## 2022-09-12 DIAGNOSIS — I10 Essential (primary) hypertension: Secondary | ICD-10-CM | POA: Insufficient documentation

## 2022-09-12 DIAGNOSIS — I471 Supraventricular tachycardia, unspecified: Secondary | ICD-10-CM | POA: Insufficient documentation

## 2022-09-12 DIAGNOSIS — I4729 Other ventricular tachycardia: Secondary | ICD-10-CM | POA: Insufficient documentation

## 2022-09-12 LAB — NM PET CT CARDIAC PERFUSION MULTI W/ABSOLUTE BLOODFLOW
LV dias vol: 88 mL (ref 46–106)
MBFR: 1.88
Nuc Rest EF: 57 %
Nuc Stress EF: 58 %
Peak HR: 99 {beats}/min
Rest HR: 80 {beats}/min
Rest MBF: 0.93 ml/g/min
Rest Nuclear Isotope Dose: 15.9 mCi
Rest perfusion cavity size (mL): 88 mL
ST Depression (mm): 0 mm
Stress MBF: 1.75 ml/g/min
Stress Nuclear Isotope Dose: 16.1 mCi
Stress perfusion cavity size (mL): 90 mL
TID: 1.07

## 2022-09-12 MED ORDER — RUBIDIUM RB82 GENERATOR (RUBYFILL)
20.0000 | PACK | Freq: Once | INTRAVENOUS | Status: AC
  Administered 2022-09-12: 15.9 via INTRAVENOUS

## 2022-09-12 MED ORDER — REGADENOSON 0.4 MG/5ML IV SOLN
0.4000 mg | Freq: Once | INTRAVENOUS | Status: AC
  Administered 2022-09-12: 0.4 mg via INTRAVENOUS

## 2022-09-12 MED ORDER — RUBIDIUM RB82 GENERATOR (RUBYFILL)
20.0000 | PACK | Freq: Once | INTRAVENOUS | Status: AC
  Administered 2022-09-12: 16.1 via INTRAVENOUS

## 2022-09-12 MED ORDER — REGADENOSON 0.4 MG/5ML IV SOLN
INTRAVENOUS | Status: AC
  Filled 2022-09-12: qty 5

## 2022-09-13 DIAGNOSIS — S12090G Other displaced fracture of first cervical vertebra, subsequent encounter for fracture with delayed healing: Secondary | ICD-10-CM | POA: Diagnosis not present

## 2022-09-15 ENCOUNTER — Telehealth: Payer: Self-pay | Admitting: Family Medicine

## 2022-09-15 NOTE — Telephone Encounter (Signed)
Patient is requesting to transfer from [facility] to [facility] due to the facility being closer to home.  Please advise.  States she has seen Dr. Jimmey Ralph before.

## 2022-09-15 NOTE — Telephone Encounter (Signed)
Yes, thank you.

## 2022-09-18 ENCOUNTER — Encounter: Payer: Self-pay | Admitting: *Deleted

## 2022-09-19 ENCOUNTER — Other Ambulatory Visit: Payer: Self-pay | Admitting: Internal Medicine

## 2022-09-19 ENCOUNTER — Telehealth: Payer: Self-pay | Admitting: Internal Medicine

## 2022-09-19 NOTE — Telephone Encounter (Signed)
Pt has requested to transfer her care from Dr. Drue Novel to Dr. Ardyth Harps. Is this transfer okay with both providers?

## 2022-09-19 NOTE — Telephone Encounter (Signed)
That is okay with me.  Thank you.

## 2022-09-21 NOTE — Telephone Encounter (Signed)
Patient has transferred to [facility].

## 2022-10-03 ENCOUNTER — Ambulatory Visit (INDEPENDENT_AMBULATORY_CARE_PROVIDER_SITE_OTHER): Payer: Medicare Other | Admitting: Internal Medicine

## 2022-10-03 ENCOUNTER — Encounter: Payer: Self-pay | Admitting: Internal Medicine

## 2022-10-03 VITALS — BP 120/78 | HR 73 | Temp 98.0°F | Ht 61.0 in | Wt 135.7 lb

## 2022-10-03 DIAGNOSIS — I10 Essential (primary) hypertension: Secondary | ICD-10-CM

## 2022-10-03 DIAGNOSIS — Z7985 Long-term (current) use of injectable non-insulin antidiabetic drugs: Secondary | ICD-10-CM | POA: Diagnosis not present

## 2022-10-03 DIAGNOSIS — E785 Hyperlipidemia, unspecified: Secondary | ICD-10-CM

## 2022-10-03 DIAGNOSIS — S12090D Other displaced fracture of first cervical vertebra, subsequent encounter for fracture with routine healing: Secondary | ICD-10-CM | POA: Diagnosis not present

## 2022-10-03 DIAGNOSIS — E119 Type 2 diabetes mellitus without complications: Secondary | ICD-10-CM | POA: Diagnosis not present

## 2022-10-03 DIAGNOSIS — F419 Anxiety disorder, unspecified: Secondary | ICD-10-CM

## 2022-10-03 MED ORDER — ONETOUCH ULTRASOFT 2 LANCETS MISC
1.0000 | Freq: Two times a day (BID) | 12 refills | Status: DC
Start: 2022-10-03 — End: 2023-07-24

## 2022-10-03 MED ORDER — ONETOUCH ULTRA VI STRP
ORAL_STRIP | 12 refills | Status: DC
Start: 2022-10-03 — End: 2023-10-23

## 2022-10-03 MED ORDER — ALPRAZOLAM 0.25 MG PO TABS
ORAL_TABLET | ORAL | 1 refills | Status: DC
Start: 2022-10-03 — End: 2023-02-07

## 2022-10-03 MED ORDER — SEMAGLUTIDE(0.25 OR 0.5MG/DOS) 2 MG/3ML ~~LOC~~ SOPN
PEN_INJECTOR | SUBCUTANEOUS | 3 refills | Status: DC
Start: 2022-10-03 — End: 2023-01-01

## 2022-10-03 MED ORDER — CARVEDILOL 25 MG PO TABS: ORAL_TABLET | ORAL | 1 refills | Status: DC

## 2022-10-03 MED ORDER — METFORMIN HCL 1000 MG PO TABS: ORAL_TABLET | ORAL | 1 refills | Status: DC

## 2022-10-03 NOTE — Progress Notes (Signed)
Established Patient Office Visit     CC/Reason for Visit: Establish care, discuss chronic concerns  HPI: [name] is a 89 y.o. female who is coming in today for the above mentioned reasons. Past Medical History is significant for: Type 2 diabetes, hyperlipidemia, hypertension, diverticulosis with prior history of diverticulitis.  In October 2023 she suffered a fall resulting in a C1 fracture, is currently wearing a soft collar and having frequent follow-ups with orthopedics.  She has no acute complaints today.  She is overdue for Medicare wellness visit.   Past Medical/Surgical History: Past Medical History:  Diagnosis Date   Anxiety 12/09/2013   Arthritis    Broken bones    face   Cataract    Diabetes mellitus    Diverticulitis    patient states has had three attacks    Diverticulosis    DJD (degenerative joint disease) 12/14/2014   Glaucoma    H. pylori infection 2001   Heart murmur    Hx of adenomatous colonic polyps    Hyperlipidemia    Hypertension    Intraductal papilloma    bx nef x 2 in the 70s-90s   Saccular aneurysm    SCC (squamous cell carcinoma) 03/2011   sees dermatology    Past Surgical History:  Procedure Laterality Date   BREAST BIOPSY  '91, '95   INTRADUCTAL PAPILLOMA   BREAST LUMPECTOMY     CATARACT EXTRACTION Bilateral 09/2014   CLOSED REDUCTION ULNAR SHAFT Right 10/06/2017   Procedure: CLOSED REDUCTION DISLOCATED RIGHT SHOULDER;  Surgeon: Kathryne Hitch, MD;  Location: [facility] OR;  Service: Orthopedics;  Laterality: Right;   COLONOSCOPY      Social History:  reports that she has never smoked. She has never used smokeless tobacco. She reports that she does not drink alcohol and does not use drugs.  Allergies: Allergies  Allergen Reactions   Ciprofloxacin Nausea And Vomiting    Family History:  Family History  Adopted: Yes  Problem Relation Age of Onset   Heart disease Mother 45   Heart disease Father 54   Skin cancer  Sister    Colon cancer Maternal Grandfather        COLON,, FAMILY HX 7 MEMBERS   Breast cancer Maternal Aunt    Stroke Maternal Aunt    Breast cancer Maternal Aunt    Colon cancer Paternal Uncle        7 uncles   Rectal cancer Neg Hx    Stomach cancer Neg Hx      Current Outpatient Medications:    ALPRAZolam (XANAX) 0.25 MG tablet, TAKE 1 TABLET(0.25 MG) BY MOUTH DAILY AS NEEDED FOR ANXIETY, Disp: 30 tablet, Rfl: 1   carvedilol (COREG) 25 MG tablet, TAKE 1 TABLET(25 MG) BY MOUTH TWICE DAILY WITH A MEAL, Disp: 180 tablet, Rfl: 0   cholecalciferol (VITAMIN D) 1000 UNITS tablet, Take 1,000 Units by mouth daily., Disp: , Rfl:    co-enzyme Q-10 30 MG capsule, Take 30 mg by mouth daily., Disp: , Rfl:    dorzolamide-timolol (COSOPT) 22.3-6.8 MG/ML ophthalmic solution, Place 1 drop into both eyes 2 (two) times daily. , Disp: , Rfl:    metFORMIN (GLUCOPHAGE) 1000 MG tablet, TAKE 1 TABLET WITH BREAKFAST AND LUNCH THEN 1/2 TABLET AT BEDTIME, Disp: 225 tablet, Rfl: 1   ONETOUCH ULTRA test strip, CHECK BLOOD SUGAR NO MORE THAN 2 TIMES PER DAY, Disp: 200 strip, Rfl: 12   OneTouch UltraSoft 2 Lancets MISC, TEST BLOOD SUGAR  NO MOORE THEN TWICE DAILY, Disp: 200 each, Rfl: 12   RESTASIS 0.05 % ophthalmic emulsion, , Disp: , Rfl:    Semaglutide,0.25 or 0.5MG /DOS, 2 MG/3ML SOPN, INJECT 0.5 UNDER THE SKIN ONCE A WEEK, Disp: 3 mL, Rfl: 3  Review of Systems:  Negative unless indicated in HPI.   Physical Exam: Vitals:   10/03/22 1425  BP: 120/78  Pulse: 73  Temp: 98 F (36.7 C)  TempSrc: Oral  SpO2: 98%  Weight: 135 lb 11.2 oz (61.6 kg)  Height: 5\' 1"  (1.549 m)    Body mass index is 25.64 kg/m.   Physical Exam Vitals reviewed.  Constitutional:      Appearance: Normal appearance.  HENT:     Head: Normocephalic and atraumatic.  Eyes:     Conjunctiva/sclera: Conjunctivae normal.     Pupils: Pupils are equal, round, and reactive to light.  Cardiovascular:     Rate and Rhythm: Normal rate  and regular rhythm.  Pulmonary:     Effort: Pulmonary effort is normal.     Breath sounds: Normal breath sounds.  Skin:    General: Skin is warm and dry.  Neurological:     General: No focal deficit present.     Mental Status: She is alert and oriented to person, place, and time.  Psychiatric:        Mood and Affect: Mood normal.        Behavior: Behavior normal.        Thought Content: Thought content normal.        Judgment: Judgment normal.      Impression and Plan:  Controlled type 2 diabetes mellitus without complication, without long-term current use of insulin (HCC)  Essential hypertension  Dyslipidemia  Closed fracture of anterior arch of atlas with routine healing, subsequent encounter  -Most recent A1c was well-controlled at 6.0.  She is on metformin and Ozempic. -She continues close follow-up with orthopedics in regards to her C1 fracture.  She is wearing a soft collar today. -Blood pressure is well-controlled. -She does not appear to be on a statin, will need to check lipids as her goal LDL is less than 70. -She will schedule visit for annual physical and wellness visit.   Time spent:35 minutes reviewing chart, interviewing and examining patient and formulating plan of care.     Chaya Jan, MD [facility]

## 2022-10-18 ENCOUNTER — Ambulatory Visit: Payer: Medicare Other | Admitting: Orthopedic Surgery

## 2022-10-18 DIAGNOSIS — M542 Cervicalgia: Secondary | ICD-10-CM | POA: Diagnosis not present

## 2022-10-18 MED ORDER — TRAMADOL HCL 50 MG PO TABS: 50.0000 mg | ORAL_TABLET | Freq: Four times a day (QID) | ORAL | 0 refills | Status: AC | PRN

## 2022-10-18 NOTE — Progress Notes (Signed)
Orthopedic Spine Surgery Office Note   Assessment: Patient is a 89 y.o. female with chronic neck pain after a C1 and C2 fracture     Plan: -Briefly discussed surgery as a treatment option. She is still not interested -Since she is not interested in surgery, then making her pain more tolerable will have to be the goal. I prescribed tramadol to help with her pain  -Patient should return to office in 8 weeks, x-rays at next visit: none     Patient expressed understanding of the plan and all questions were answered to the patient's satisfaction.    ___________________________________________________________________________     History:   Patient is a 89 y.o. female who presents today for follow up of C1 and C2 fractures. She is still having significant neck pain. She says she feels it with rotation and extension. It is not as bad with flexion. Her pain is tolerable if she is not doing much moving. Tramadol had previously been helpful but she has not used it in awhile.  No radicular pain. Denies paresthesias and numbness. No recent changes in her pain.    Treatments tried: collar use, activity modification, oxycodone, tylenol, NSAIDs, tramadol     Physical Exam:   General: no acute distress, appears stated age Neurologic: alert, answering questions appropriately, following commands Respiratory: unlabored breathing on room air, symmetric chest rise Psychiatric: appropriate affect, normal cadence to speech     MSK (spine):   -Strength exam                                                   Left                  Right Grip strength                5/5                  5/5 Interosseus                  5/5                  5/5 Wrist extension            5/5                  5/5 Wrist flexion                 5/5                  5/5 Elbow flexion                5/5                  5/5 Deltoid                          5/5                  4/5     -Sensory exam                          Sensation intact to light touch in C5-T1 nerve distributions of bilateral upper extremities   -Hoffman sign: negative bilaterally -Negative grip and release test  Imaging: XR of the cervical spine from 06/15/2022 was previously independently reviewed and interpreted, showing ~79mm lateral mass overhang on the left side at C1/2. Displaced C2 lateral mass with depression. Decreased joint space at C1/2 on the left. No lateral mass overhang on the contralateral side. Degenerative changes throughout the cervical spine.    XR of the right shoulder from 09/07/2022 was previously independently reviewed and interpreted, showing high riding humeral head. Humeral head appears subluxated but not dislocated on the axillary image. No fracture seen. Significant AC joint arthrosis.      Patient name: Patricia Moore Patient MRN: [id number] Date of visit: 10/18/22

## 2022-10-20 ENCOUNTER — Ambulatory Visit: Payer: Medicare Other | Admitting: Cardiology

## 2022-10-25 ENCOUNTER — Telehealth: Payer: Self-pay | Admitting: Pharmacist

## 2022-10-25 NOTE — Telephone Encounter (Signed)
Our office had gotten Ozempic approved thru [employer] for 2024. Patient has now transferred to new physician and will need new Rx sent to [employer] so that Ozempic will be delivered to her new PCP's office.   [employer] and suspended delivery of Ozempic until they receive updated Rx.  Forwarding to Rx Assist Team to facilitate change.

## 2022-10-26 NOTE — Telephone Encounter (Signed)
Faxed refill form to new providers office.

## 2022-11-14 DIAGNOSIS — R682 Dry mouth, unspecified: Secondary | ICD-10-CM | POA: Diagnosis not present

## 2022-11-14 DIAGNOSIS — J3489 Other specified disorders of nose and nasal sinuses: Secondary | ICD-10-CM | POA: Diagnosis not present

## 2022-11-15 ENCOUNTER — Encounter (INDEPENDENT_AMBULATORY_CARE_PROVIDER_SITE_OTHER): Payer: Self-pay

## 2022-11-17 ENCOUNTER — Telehealth: Payer: Self-pay

## 2022-11-17 NOTE — Telephone Encounter (Signed)
Followed up on refills w/ novo nordisk.   Office scanned refill form to pt's media, but medication was never faxed to company.   Faxed scanned media to company for patient to receive refills at new office location.

## 2022-11-17 NOTE — Progress Notes (Signed)
11/17/2022  Patient ID: Patricia Moore, female   DOB: 12-18-1933, 89 y.o.   MRN: [id number]  Patient outreach attempt to assist with Ozempic PAP.  Patient recently changed PCP, and medication assistance team is needing to verify which provider she is now seeing, so they can update facility mailing address for Ozempic.  It appears she established care with Dr. Ardyth Harps at [facility], but I was not able to contact the patient to confirm.  Voicemail with my direct phone number was left; and if I do not hear back, I will call again next week.  Lenna Gilford, PharmD, DPLA

## 2022-12-04 ENCOUNTER — Ambulatory Visit (INDEPENDENT_AMBULATORY_CARE_PROVIDER_SITE_OTHER): Payer: Medicare Other | Admitting: Internal Medicine

## 2022-12-04 VITALS — BP 130/70 | HR 70 | Temp 97.7°F | Ht 61.0 in | Wt 130.2 lb

## 2022-12-04 DIAGNOSIS — M810 Age-related osteoporosis without current pathological fracture: Secondary | ICD-10-CM

## 2022-12-04 DIAGNOSIS — I10 Essential (primary) hypertension: Secondary | ICD-10-CM | POA: Diagnosis not present

## 2022-12-04 DIAGNOSIS — E785 Hyperlipidemia, unspecified: Secondary | ICD-10-CM | POA: Diagnosis not present

## 2022-12-04 DIAGNOSIS — E119 Type 2 diabetes mellitus without complications: Secondary | ICD-10-CM

## 2022-12-04 DIAGNOSIS — E559 Vitamin D deficiency, unspecified: Secondary | ICD-10-CM

## 2022-12-04 DIAGNOSIS — Z0000 Encounter for general adult medical examination without abnormal findings: Secondary | ICD-10-CM

## 2022-12-04 NOTE — Progress Notes (Signed)
Established Patient Office Visit     CC/Reason for Visit: Annual preventive exam and subsequent Medicare wellness visit  HPI: Patricia Moore is a 89 y.o. female who is coming in today for the above mentioned reasons. Past Medical History is significant for: Well-controlled type 2 diabetes, hypertension, hyperlipidemia.  Feeling well without acute concerns or complaints.  She is overdue for an eye exam and is requesting referral.  Cancer screening is up-to-date, DEXA is up-to-date advised to update flu, RSV and shingles vaccines.   Past Medical/Surgical History: Past Medical History:  Diagnosis Date   Anxiety 12/09/2013   Arthritis    Broken bones    face   Cataract    Diabetes mellitus    Diverticulitis    patient states has had three attacks    Diverticulosis    DJD (degenerative joint disease) 12/14/2014   Glaucoma    H. pylori infection 2001   Heart murmur    Hx of adenomatous colonic polyps    Hyperlipidemia    Hypertension    Intraductal papilloma    bx nef x 2 in the 70s-90s   Saccular aneurysm    SCC (squamous cell carcinoma) 03/2011   sees dermatology    Past Surgical History:  Procedure Laterality Date   BREAST BIOPSY  '91, '95   INTRADUCTAL PAPILLOMA   BREAST LUMPECTOMY     CATARACT EXTRACTION Bilateral 09/2014   CLOSED REDUCTION ULNAR SHAFT Right 10/06/2017   Procedure: CLOSED REDUCTION DISLOCATED RIGHT SHOULDER;  Surgeon: Kathryne Hitch, MD;  Location: [facility] OR;  Service: Orthopedics;  Laterality: Right;   COLONOSCOPY      Social History:  reports that she has never smoked. She has never used smokeless tobacco. She reports that she does not drink alcohol and does not use drugs.  Allergies: Allergies  Allergen Reactions   Ciprofloxacin Nausea And Vomiting    Family History:  Family History  Adopted: Yes  Problem Relation Age of Onset   Heart disease Mother 23   Heart disease Father 86   Skin cancer Sister    Colon cancer Maternal  Grandfather        COLON,, FAMILY HX 7 MEMBERS   Breast cancer Maternal Aunt    Stroke Maternal Aunt    Breast cancer Maternal Aunt    Colon cancer Paternal Uncle        7 uncles   Rectal cancer Neg Hx    Stomach cancer Neg Hx      Current Outpatient Medications:    carvedilol (COREG) 25 MG tablet, TAKE 1 TABLET(25 MG) BY MOUTH TWICE DAILY WITH A MEAL, Disp: 180 tablet, Rfl: 1   cholecalciferol (VITAMIN D) 1000 UNITS tablet, Take 1,000 Units by mouth daily., Disp: , Rfl:    co-enzyme Q-10 30 MG capsule, Take 30 mg by mouth daily., Disp: , Rfl:    dorzolamide-timolol (COSOPT) 22.3-6.8 MG/ML ophthalmic solution, Place 1 drop into both eyes 2 (two) times daily. , Disp: , Rfl:    glucose blood (ONETOUCH ULTRA) test strip, CHECK BLOOD SUGAR NO MORE THAN 2 TIMES PER DAY, Disp: 200 strip, Rfl: 12   metFORMIN (GLUCOPHAGE) 1000 MG tablet, TAKE 1 TABLET WITH BREAKFAST AND LUNCH THEN 1/2 TABLET AT BEDTIME, Disp: 225 tablet, Rfl: 1   OneTouch UltraSoft 2 Lancets MISC, 1 each by Other route 2 (two) times daily., Disp: 200 each, Rfl: 12   RESTASIS 0.05 % ophthalmic emulsion, , Disp: , Rfl:    Semaglutide,0.25 or  0.5MG /DOS, 2 MG/3ML SOPN, INJECT 0.5 UNDER THE SKIN ONCE A WEEK, Disp: 3 mL, Rfl: 3   ALPRAZolam (XANAX) 0.25 MG tablet, TAKE 1 TABLET(0.25 MG) BY MOUTH DAILY AS NEEDED FOR ANXIETY (Patient not taking: Reported on 12/04/2022), Disp: 30 tablet, Rfl: 1  Review of Systems:  Negative unless indicated in HPI.   Physical Exam: Vitals:   12/04/22 1324  BP: 130/70  Pulse: 70  Temp: 97.7 F (36.5 C)  TempSrc: Oral  SpO2: 99%  Weight: 130 lb 3.2 oz (59.1 kg)  Height: 5\' 1"  (1.549 m)    Body mass index is 24.6 kg/m.   Physical Exam Vitals reviewed.  Constitutional:      General: She is not in acute distress.    Appearance: Normal appearance. She is not ill-appearing, toxic-appearing or diaphoretic.  HENT:     Head: Normocephalic.     Right Ear: Tympanic membrane, ear canal and  external ear normal. There is no impacted cerumen.     Left Ear: Tympanic membrane, ear canal and external ear normal. There is no impacted cerumen.     Nose: Nose normal.     Mouth/Throat:     Mouth: Mucous membranes are moist.     Pharynx: Oropharynx is clear. No oropharyngeal exudate or posterior oropharyngeal erythema.  Eyes:     General: No scleral icterus.       Right eye: No discharge.        Left eye: No discharge.     Conjunctiva/sclera: Conjunctivae normal.     Pupils: Pupils are equal, round, and reactive to light.  Neck:     Vascular: No carotid bruit.  Cardiovascular:     Rate and Rhythm: Normal rate and regular rhythm.     Pulses: Normal pulses.     Heart sounds: Normal heart sounds.  Pulmonary:     Effort: Pulmonary effort is normal. No respiratory distress.     Breath sounds: Normal breath sounds.  Abdominal:     General: Abdomen is flat. Bowel sounds are normal.     Palpations: Abdomen is soft.  Musculoskeletal:        General: Normal range of motion.     Cervical back: Normal range of motion.  Skin:    General: Skin is warm and dry.  Neurological:     General: No focal deficit present.     Mental Status: She is alert and oriented to person, place, and time. Mental status is at baseline.  Psychiatric:        Mood and Affect: Mood normal.        Behavior: Behavior normal.        Thought Content: Thought content normal.        Judgment: Judgment normal.    Subsequent Medicare wellness visit   1. Risk factors, based on past  M,S,F -     2.  Physical activities: Dietary issues and exercise activities discussed:      3.  Depression/mood:  Flowsheet Row Office Visit from 10/03/2022 in [facility] Total Score 0        4.  ADL's:    12/04/2022    1:22 PM  In your present state of health, do you have any difficulty performing the following activities:  Hearing? 1  Vision? 1  Difficulty concentrating or making  decisions? 0  Walking or climbing stairs? 1  Dressing or bathing? 0  Doing errands, shopping? 0  Preparing Food and eating ?  N  Using the Toilet? N  In the past six months, have you accidently leaked urine? Y  Do you have problems with loss of bowel control? N  Managing your Medications? N  Managing your Finances? N  Housekeeping or managing your Housekeeping? N     5.  Fall risk:     11/25/2021   12:48 PM 12/23/2021   10:22 AM 01/24/2022    2:48 PM 02/01/2022    3:59 PM 10/03/2022    4:04 PM  Fall Risk  Falls in the past year? 0 0 1  1  Was there an injury with Fall? 0 0 1  1  Fall Risk Category Calculator 0 0 2  2  Fall Risk Category (Retired) Low Low Moderate    (RETIRED) Patient Fall Risk Level Low fall risk Low fall risk Moderate fall risk High fall risk   Patient at Risk for Falls Due to No Fall Risks No Fall Risks     Fall risk Follow up Falls evaluation completed  Falls evaluation completed  Falls evaluation completed     6.  Home safety: No problems identified   7.  Height weight, and visual acuity: height and weight as above, vision/hearing: Vision Screening   Right eye Left eye Both eyes  Without correction 20/40 20/40 20/40   With correction        8.  Counseling: Counseling given: Not Answered    9. Lab orders based on risk factors: Laboratory update will be reviewed   10. Cognitive assessment:        12/04/2022    1:27 PM  6CIT Screen  What Year? 0 points  What month? 0 points  What time? 0 points  Count back from 20 0 points  Months in reverse 0 points  Repeat phrase 0 points  Total Score 0 points     11. Screening: Patient provided with a written and personalized 5-10 year screening schedule in the AVS. Health Maintenance  Topic Date Due   Complete foot exam   11/23/2021   COVID-19 Vaccine (5 - 2023-24 season) 12/16/2021   Eye exam for diabetics  04/25/2022   Hemoglobin A1C  05/28/2022   Flu Shot  11/16/2022   Medicare Annual Wellness  Visit  12/04/2023   DTaP/Tdap/Td vaccine (3 - Td or Tdap) 10/07/2027   Pneumonia Vaccine  Completed   DEXA scan (bone density measurement)  Completed   HPV Vaccine  Aged Out   Zoster (Shingles) Vaccine  Discontinued    12. Provider List Update: Patient Care Team    Relationship Specialty Notifications Start End  Philip Aspen, Limmie Patricia, MD PCP - General Internal Medicine  10/03/22   Rollene Rotunda, MD PCP - Cardiology Cardiology Admissions 07/18/18   Arminda Resides, MD Consulting Physician Dermatology  08/06/14   Sheran Luz, MD Consulting Physician Physical Medicine and Rehabilitation  12/14/14   Drema Dallas, DO Consulting Physician Neurology  07/24/17   [name], MD Consulting Physician Obstetrics and Gynecology  09/11/17   Hilarie Fredrickson, MD Consulting Physician Gastroenterology  09/11/17   Cain Saupe, MD Referring Physician Ophthalmology  11/27/17      13. Advance Directives: Does Patient Have a Medical Advance Directive?: Yes Type of Advance Directive: Living will, Healthcare Power of Attorney Does patient want to make changes to medical advance directive?: No - Patient declined Copy of Healthcare Power of Attorney in Chart?: No - copy requested  14. Opioids: Patient is not on any opioid prescriptions and  has no risk factors for a substance use disorder.   15.   Goals      HEMOGLOBIN A1C < 7     Patient will continue to check blood glucose daily Home Blood glucose goals:  Fasting blood glucose goal (before meals) = 80 to 130 Blood glucose goal after a meal = less than 180   Continue to work with PCP, Dr Drue Novel and Clinical Pharmacist Practitioner on medication access solutions for medication therapy for diabetes.  Continue to exercise at least 150 minutes per week.  Follow diabetic diet -  Increase non-starchy vegetables - broccoli, cauliflower, green bean, squash, zucchini, tomatoes, onions, peppers, spinach and other green leafy vegetables, cabbage, lettuce,  cucumbers, asparagus, okra (not fried), eggplant Limit sugar and processed foods (cakes, cookies, ice cream, crackers and chips) Increase fresh fruit but limit serving sizes 1/2 cup or about the size of tennis or baseball Limit red meat to no more than 1-2 times per week (serving size about the size of your palm) Choose whole grains / lean proteins - whole wheat bread, quinoa, whole grain rice (1/2 cup), fish, chicken, [place] Avoid sugar and calorie containing beverages - soda, sweet tea and juice.  Choose water or unsweetened tea instead.      Maintain healthy active lifestyle.     Manage Pain     Evidence-based guidance:  Develop a mutual, multimodal pain management plan with patient and caregiver; determine person's knowledge of the condition, any concerns or fears, personal preferences and ability to access services.  Assess pain level, treatment efficacy and patient response at regular intervals using a consistent pain scale; review the effectiveness and tolerability of all treatments.  Correlate pain with impact on daily activities, occupation, mood, sleep quality, comorbidities, other (nonarthritic) pain, fall risk and quality of life.  Encourage nonpharmacologic measures, such as applied heat or cold, exercise, physical or occupational therapy, orthoses, cognitive behavioral therapy, yoga, tai-chi, acupuncture, adequate sleep and weight loss.  Provide anticipatory guidance regarding benefits to multimodal treatment that improves quality of life.  Consider the use of transcutaneous TENS (electrical nerve stimulation), shock-absorbing footwear, orthoses, mineral baths, nutraceuticals or electromagnetic-field therapy.  Prepare patient for individualized pharmacologic pain management in a stepped approach based on presentation, risk and comorbidities; monitor and manage side effects and anticipate periodic adjustments.  Promote individualized plan to stay active when unable to participate in  physical therapy, such as passive and active range of motion, yoga, walking, water aerobics or swimming.  Support and optimize psychosocial response to pain.   Notes:          I have personally reviewed and noted the following in the patient's chart:   Medical and social history Use of alcohol, tobacco or illicit drugs  Current medications and supplements Functional ability and status Nutritional status Physical activity Advanced directives List of other physicians Hospitalizations, surgeries, and ER visits in previous 12 months Vitals Screenings to include cognitive, depression, and falls Referrals and appointments  In addition, I have reviewed and discussed with patient certain preventive protocols, quality metrics, and best practice recommendations. A written personalized care plan for preventive services as well as general preventive health recommendations were provided to patient.   Impression and Plan:  Controlled type 2 diabetes mellitus without complication, without long-term current use of insulin (HCC) -     CBC with Differential/Platelet; Future -     Comprehensive metabolic panel; Future -     Hemoglobin A1c; Future -     Ambulatory  referral to Ophthalmology  Essential hypertension  Dyslipidemia -     Lipid panel; Future  Medicare annual wellness visit, subsequent  Vitamin D deficiency -     VITAMIN D 25 Hydroxy (Vit-D Deficiency, Fractures); Future  Osteoporosis without current pathological fracture, unspecified osteoporosis type -     VITAMIN D 25 Hydroxy (Vit-D Deficiency, Fractures); Future   -Recommend routine eye and dental care. -Healthy lifestyle discussed in detail. -Labs to be updated today. -Prostate cancer screening: N/A Health Maintenance  Topic Date Due   Complete foot exam   11/23/2021   COVID-19 Vaccine (5 - 2023-24 season) 12/16/2021   Eye exam for diabetics  04/25/2022   Hemoglobin A1C  05/28/2022   Flu Shot  11/16/2022    Medicare Annual Wellness Visit  12/04/2023   DTaP/Tdap/Td vaccine (3 - Td or Tdap) 10/07/2027   Pneumonia Vaccine  Completed   DEXA scan (bone density measurement)  Completed   HPV Vaccine  Aged Out   Zoster (Shingles) Vaccine  Discontinued     -Advise she update flu, COVID, RSV vaccines.  All other immunizations are up-to-date.     Chaya Jan, MD [facility] at [facility] [address]

## 2022-12-05 LAB — COMPREHENSIVE METABOLIC PANEL
ALT: 14 U/L (ref 0–35)
AST: 24 U/L (ref 0–37)
Albumin: 4.6 g/dL (ref 3.5–5.2)
Alkaline Phosphatase: 66 U/L (ref 39–117)
BUN: 25 mg/dL — ABNORMAL HIGH (ref 6–23)
CO2: 28 mEq/L (ref 19–32)
Calcium: 10.8 mg/dL — ABNORMAL HIGH (ref 8.4–10.5)
Chloride: 98 meq/L (ref 96–112)
Creatinine, Ser: 0.95 mg/dL (ref 0.40–1.20)
GFR: 54.1 mL/min — ABNORMAL LOW (ref >60.00)
Glucose, Bld: 88 mg/dL (ref 70–99)
Potassium: 4.5 meq/L (ref 3.5–5.1)
Sodium: 137 meq/L (ref 135–145)
Total Bilirubin: 0.5 mg/dL (ref 0.2–1.2)
Total Protein: 8.5 g/dL — ABNORMAL HIGH (ref 6.0–8.3)

## 2022-12-05 LAB — CBC WITH DIFFERENTIAL/PLATELET
Basophils Absolute: 0.2 10*3/uL — ABNORMAL HIGH (ref 0.0–0.1)
Basophils Relative: 2.6 % (ref 0.0–3.0)
Eosinophils Absolute: 0.5 10*3/uL (ref 0.0–0.7)
Eosinophils Relative: 6.2 % — ABNORMAL HIGH (ref 0.0–5.0)
HCT: 41.7 % (ref 36.0–46.0)
Hemoglobin: 13.7 g/dL (ref 12.0–15.0)
Lymphocytes Relative: 37.1 % (ref 12.0–46.0)
Lymphs Abs: 2.7 10*3/uL (ref 0.7–4.0)
MCHC: 33 g/dL (ref 30.0–36.0)
MCV: 93.3 fl (ref 78.0–100.0)
Monocytes Absolute: 0.7 10*3/uL (ref 0.1–1.0)
Monocytes Relative: 10.1 % (ref 3.0–12.0)
Neutro Abs: 3.3 10*3/uL (ref 1.4–7.7)
Neutrophils Relative %: 44 % (ref 43.0–77.0)
Platelets: 318 10*3/uL (ref 150.0–400.0)
RBC: 4.46 Mil/uL (ref 3.87–5.11)
RDW: 12.7 % (ref 11.5–15.5)
WBC: 7.4 10*3/uL (ref 4.0–10.5)

## 2022-12-05 LAB — LIPID PANEL
Cholesterol: 237 mg/dL — ABNORMAL HIGH (ref 0–200)
HDL: 65.1 mg/dL (ref >39.00)
LDL Cholesterol: 151 mg/dL — ABNORMAL HIGH (ref 0–99)
NonHDL: 171.58
Total CHOL/HDL Ratio: 4
Triglycerides: 101 mg/dL (ref 0.0–149.0)
VLDL: 20.2 mg/dL (ref 0.0–40.0)

## 2022-12-05 LAB — VITAMIN D 25 HYDROXY (VIT D DEFICIENCY, FRACTURES): VITD: 68.14 ng/mL (ref 30.00–100.00)

## 2022-12-05 LAB — HEMOGLOBIN A1C: Hgb A1c MFr Bld: 5.9 % (ref 4.6–6.5)

## 2022-12-06 ENCOUNTER — Telehealth: Payer: Self-pay | Admitting: Orthopedic Surgery

## 2022-12-06 MED ORDER — TRAMADOL HCL 50 MG PO TABS: 50.0000 mg | ORAL_TABLET | Freq: Four times a day (QID) | ORAL | 0 refills | Status: AC | PRN

## 2022-12-06 NOTE — Telephone Encounter (Signed)
Patient came by. She would like pain medication called in.

## 2022-12-14 ENCOUNTER — Ambulatory Visit (INDEPENDENT_AMBULATORY_CARE_PROVIDER_SITE_OTHER): Payer: Medicare Other | Admitting: Internal Medicine

## 2022-12-14 VITALS — BP 130/70 | HR 75 | Temp 98.1°F | Wt 133.0 lb

## 2022-12-14 DIAGNOSIS — E785 Hyperlipidemia, unspecified: Secondary | ICD-10-CM

## 2022-12-14 DIAGNOSIS — Z23 Encounter for immunization: Secondary | ICD-10-CM

## 2022-12-14 NOTE — Progress Notes (Signed)
Established Patient Office Visit     CC/Reason for Visit: Discuss recent labs  HPI: Patricia Moore is a 89 y.o. female who is coming in today for the above mentioned reasons.  During recent lab work she was found to have an elevated lipid status.  Total cholesterol 237, LDL 151, HDL 65 and triglycerides 101.  She wanted to come in today to discuss.  She tells me she had been on statins in the past and has some mild myalgias and she would like to avoid if possible.   Past Medical/Surgical History: Past Medical History:  Diagnosis Date   Anxiety 12/09/2013   Arthritis    Broken bones    face   Cataract    Diabetes mellitus    Diverticulitis    patient states has had three attacks    Diverticulosis    DJD (degenerative joint disease) 12/14/2014   Glaucoma    H. pylori infection 2001   Heart murmur    Hx of adenomatous colonic polyps    Hyperlipidemia    Hypertension    Intraductal papilloma    bx nef x 2 in the 70s-90s   Saccular aneurysm    SCC (squamous cell carcinoma) 03/2011   sees dermatology    Past Surgical History:  Procedure Laterality Date   BREAST BIOPSY  '91, '95   INTRADUCTAL PAPILLOMA   BREAST LUMPECTOMY     CATARACT EXTRACTION Bilateral 09/2014   CLOSED REDUCTION ULNAR SHAFT Right 10/06/2017   Procedure: CLOSED REDUCTION DISLOCATED RIGHT SHOULDER;  Surgeon: Kathryne Hitch, MD;  Location: [facility] OR;  Service: Orthopedics;  Laterality: Right;   COLONOSCOPY      Social History:  reports that she has never smoked. She has never used smokeless tobacco. She reports that she does not drink alcohol and does not use drugs.  Allergies: Allergies  Allergen Reactions   Ciprofloxacin Nausea And Vomiting    Family History:  Family History  Adopted: Yes  Problem Relation Age of Onset   Heart disease Mother 8   Heart disease Father 10   Skin cancer Sister    Colon cancer Maternal Grandfather        COLON,, FAMILY HX 7 MEMBERS   Breast cancer  Maternal Aunt    Stroke Maternal Aunt    Breast cancer Maternal Aunt    Colon cancer Paternal Uncle        7 uncles   Rectal cancer Neg Hx    Stomach cancer Neg Hx      Current Outpatient Medications:    ALPRAZolam (XANAX) 0.25 MG tablet, TAKE 1 TABLET(0.25 MG) BY MOUTH DAILY AS NEEDED FOR ANXIETY, Disp: 30 tablet, Rfl: 1   carvedilol (COREG) 25 MG tablet, TAKE 1 TABLET(25 MG) BY MOUTH TWICE DAILY WITH A MEAL, Disp: 180 tablet, Rfl: 1   cholecalciferol (VITAMIN D) 1000 UNITS tablet, Take 1,000 Units by mouth daily., Disp: , Rfl:    co-enzyme Q-10 30 MG capsule, Take 30 mg by mouth daily., Disp: , Rfl:    dorzolamide-timolol (COSOPT) 22.3-6.8 MG/ML ophthalmic solution, Place 1 drop into both eyes 2 (two) times daily. , Disp: , Rfl:    glucose blood (ONETOUCH ULTRA) test strip, CHECK BLOOD SUGAR NO MORE THAN 2 TIMES PER DAY, Disp: 200 strip, Rfl: 12   metFORMIN (GLUCOPHAGE) 1000 MG tablet, TAKE 1 TABLET WITH BREAKFAST AND LUNCH THEN 1/2 TABLET AT BEDTIME, Disp: 225 tablet, Rfl: 1   OneTouch UltraSoft 2 Lancets MISC, 1  each by Other route 2 (two) times daily., Disp: 200 each, Rfl: 12   RESTASIS 0.05 % ophthalmic emulsion, , Disp: , Rfl:    Semaglutide,0.25 or 0.5MG /DOS, 2 MG/3ML SOPN, INJECT 0.5 UNDER THE SKIN ONCE A WEEK, Disp: 3 mL, Rfl: 3  Review of Systems:  Negative unless indicated in HPI.   Physical Exam: Vitals:   12/14/22 1447  BP: 130/70  Pulse: 75  Temp: 98.1 F (36.7 C)  TempSrc: Oral  SpO2: 98%  Weight: 133 lb (60.3 kg)    Body mass index is 25.13 kg/m.   Physical Exam Vitals reviewed.  Constitutional:      Appearance: Normal appearance.  HENT:     Head: Normocephalic and atraumatic.  Eyes:     Conjunctiva/sclera: Conjunctivae normal.     Pupils: Pupils are equal, round, and reactive to light.  Skin:    General: Skin is warm and dry.  Neurological:     General: No focal deficit present.     Mental Status: She is alert and oriented to person, place, and  time.  Psychiatric:        Mood and Affect: Mood normal.        Behavior: Behavior normal.        Thought Content: Thought content normal.        Judgment: Judgment normal.      Impression and Plan:  Dyslipidemia   -I think it is quite reasonable to not prescribe a statin given her preference and also that at her age she has likely outlived the benefit that a statin can provide to her.  Per her preference, recheck lipids in 3 months.  Time spent:23 minutes reviewing chart, interviewing and examining patient and formulating plan of care.     Chaya Jan, MD [facility] at [facility]

## 2022-12-20 ENCOUNTER — Ambulatory Visit: Payer: Medicare Other | Admitting: Orthopedic Surgery

## 2022-12-20 DIAGNOSIS — M542 Cervicalgia: Secondary | ICD-10-CM

## 2022-12-20 NOTE — Progress Notes (Signed)
Orthopedic Spine Surgery Office Note   Assessment: Patient is a 89 y.o. female with chronic neck pain after a C1 and C2 fracture. Feels pain is slightly better than last visit      Plan: -No operative plans at this time -Will continue to prescribe tramadol as needed for pain relief -Ordered CT scan to evaluate for nonunion -Patient should return to office in 6 weeks, x-rays at next visit: none     Patient expressed understanding of the plan and all questions were answered to the patient's satisfaction.    ___________________________________________________________________________     History:   Patient is a 89 y.o. female who presents today for follow up of C1 and C2 fractures. She is still having neck pain, but reports that little by little it is getting better. She still uses the soft collar when in the car or when she is going to do something more active. Pain is still worse with rotation but particularly extension. Not as bad with flexion. Denies paresthesias and numbness. No radiating arm pain.      Treatments tried: collar use, activity modification, oxycodone, tylenol, NSAIDs, tramadol     Physical Exam:   General: no acute distress, appears stated age Neurologic: alert, answering questions appropriately, following commands Respiratory: unlabored breathing on room air, symmetric chest rise Psychiatric: appropriate affect, normal cadence to speech     MSK (spine):   -Strength exam                                                   Left                  Right Grip strength                5/5                  5/5 Interosseus                  5/5                  5/5 Wrist extension            5/5                  5/5 Wrist flexion                 5/5                  5/5 Elbow flexion                5/5                  5/5 Deltoid                          5/5                  4/5     -Sensory exam                         Sensation intact to light touch in C5-T1  nerve distributions of bilateral upper extremities   -Hoffman sign: negative bilaterally -Negative grip and release test     Imaging: XR of the cervical spine from  06/15/2022 was previously independently reviewed and interpreted, showing ~98mm lateral mass overhang on the left side at C1/2. Displaced C2 lateral mass with depression. Decreased joint space at C1/2 on the left. No lateral mass overhang on the contralateral side. Degenerative changes throughout the cervical spine.      Patient name: Patricia Moore Patient MRN: [id number] Date of visit: 12/20/22

## 2022-12-25 ENCOUNTER — Encounter: Payer: Self-pay | Admitting: Internal Medicine

## 2022-12-25 NOTE — Addendum Note (Signed)
Addended by: Kern Reap B on: 12/25/2022 11:50 AM   Modules accepted: Orders

## 2022-12-28 ENCOUNTER — Telehealth: Payer: Self-pay | Admitting: Internal Medicine

## 2022-12-28 NOTE — Telephone Encounter (Signed)
Pt came in person inquiring about a form she had brought on 8/29 for Dr. Ardyth Harps to fill out. She states this form needs to be filled out in order for her to get her Ozempic free of charge. She is requesting a call back from the nurse to discuss this form further and to see if it's been completed. She also states she is down to her last dose of Ozempic.

## 2022-12-29 NOTE — Progress Notes (Signed)
12/29/2022  Patient ID: Patricia Moore, female   DOB: 10-22-1933, 89 y.o.   MRN: [id number]  Clinic routed request from PCP office stating patient is inquiring about refill status of Ozempic through [facility].  Application was submitted by the medication assistance team >39month ago, and patient was approved for PAP.  Contacted [facility] to check on shipping status of medication, and medication has not yet shipped.  [facility] states they are still awaiting provider portion from Dr. Hardie Shackleton office.  Based on notes and scan in media tab, this was already sent.  Coordinating with medication assistance team to resend; and reaching out to office to see if they have a sample patient can get in the mean time.    Lenna Gilford, PharmD, DPLA

## 2023-01-01 ENCOUNTER — Telehealth: Payer: Self-pay | Admitting: *Deleted

## 2023-01-01 ENCOUNTER — Telehealth: Payer: Self-pay

## 2023-01-01 DIAGNOSIS — E119 Type 2 diabetes mellitus without complications: Secondary | ICD-10-CM | POA: Diagnosis not present

## 2023-01-01 DIAGNOSIS — H401131 Primary open-angle glaucoma, bilateral, mild stage: Secondary | ICD-10-CM | POA: Diagnosis not present

## 2023-01-01 DIAGNOSIS — H524 Presbyopia: Secondary | ICD-10-CM | POA: Diagnosis not present

## 2023-01-01 DIAGNOSIS — H02403 Unspecified ptosis of bilateral eyelids: Secondary | ICD-10-CM | POA: Diagnosis not present

## 2023-01-01 DIAGNOSIS — H526 Other disorders of refraction: Secondary | ICD-10-CM | POA: Diagnosis not present

## 2023-01-01 DIAGNOSIS — H532 Diplopia: Secondary | ICD-10-CM | POA: Diagnosis not present

## 2023-01-01 LAB — HM DIABETES EYE EXAM

## 2023-01-01 MED ORDER — SEMAGLUTIDE(0.25 OR 0.5MG/DOS) 2 MG/3ML ~~LOC~~ SOPN
PEN_INJECTOR | SUBCUTANEOUS | Status: AC
Start: 2023-01-01 — End: ?

## 2023-01-01 NOTE — Telephone Encounter (Signed)
Refaxed refills for ozempic 0.5mg  dose refills

## 2023-01-01 NOTE — Telephone Encounter (Signed)
Samples are available for patient to pick up. Left message on machine for patient.

## 2023-01-01 NOTE — Telephone Encounter (Signed)
-----   Message from Lenna Gilford sent at 12/29/2022  4:11 PM EDT ----- [facility] states they are still missing provider portion of her PAP application; so I am working with med assistance team to resend this.  In the meantime, does the office have a sample she could get?  Thank you!

## 2023-01-01 NOTE — Telephone Encounter (Signed)
-----   Message from Lenna Gilford sent at 12/29/2022  4:12 PM EDT ----- [facility] P[facility] stating they never received Dr. Rudy Jew portion of the Ozempic application.  I am pretty sure this was sent, though; and it appears to be in the media tab. Do you mind to follow up when you are able?  Thank you!

## 2023-01-02 ENCOUNTER — Encounter: Payer: Self-pay | Admitting: Cardiology
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
## 2023-01-03 ENCOUNTER — Ambulatory Visit: Payer: Medicare Other | Attending: Cardiology | Admitting: Cardiology

## 2023-01-03 ENCOUNTER — Encounter: Payer: Self-pay | Admitting: Cardiology

## 2023-01-03 VITALS — BP 120/60 | HR 88 | Ht 61.0 in | Wt 132.2 lb

## 2023-01-03 DIAGNOSIS — R42 Dizziness and giddiness: Secondary | ICD-10-CM

## 2023-01-03 DIAGNOSIS — I10 Essential (primary) hypertension: Secondary | ICD-10-CM | POA: Diagnosis not present

## 2023-01-03 DIAGNOSIS — R002 Palpitations: Secondary | ICD-10-CM | POA: Diagnosis not present

## 2023-01-03 NOTE — Patient Instructions (Signed)
Medication Instructions:  NO CHANGES   Follow-Up: At [facility], you and your health needs are our priority.  As part of our continuing mission to provide you with exceptional heart care, we have created designated Provider Care Teams.  These Care Teams include your primary Cardiologist (physician) and Advanced Practice Providers (APPs -  Physician Assistants and Nurse Practitioners) who all work together to provide you with the care you need, when you need it.  We recommend signing up for the patient portal called "MyChart".  Sign up information is provided on this After Visit Summary.  MyChart is used to connect with patients for Virtual Visits (Telemedicine).  Patients are able to view lab/test results, encounter notes, upcoming appointments, etc.  Non-urgent messages can be sent to your provider as well.   To learn more about what you can do with MyChart, go to [email or web address].    Your next appointment:   12 months with Dr. Antoine Poche        Happy Birthday!

## 2023-01-04 ENCOUNTER — Telehealth: Payer: Self-pay | Admitting: Orthopedic Surgery

## 2023-01-04 MED ORDER — TRAMADOL HCL 50 MG PO TABS: 50.0000 mg | ORAL_TABLET | Freq: Four times a day (QID) | ORAL | 0 refills | Status: AC | PRN

## 2023-01-04 NOTE — Telephone Encounter (Signed)
Tried contacting patient. Had to leave a voicemail.

## 2023-01-04 NOTE — Telephone Encounter (Signed)
Per [employer]:  Need to send pages 4-5 of main application to change provider information. [employer] will not accept refill form to change provider.  Original approval was under Dr. Drue Novel in [facility]  Not sure of patients last order, but re-enrollment starts 01/30/23 (and novo has new applications starting 01/20/23). Enrollment ends 04/17/23.  Do you still want to submit a new application before re-enrollment, or have patient pickup last refills at previous office?

## 2023-01-04 NOTE — Telephone Encounter (Signed)
Patient came by. She would like some pain medication called in for her. Her cb# is [phone]

## 2023-01-06 ENCOUNTER — Ambulatory Visit (HOSPITAL_BASED_OUTPATIENT_CLINIC_OR_DEPARTMENT_OTHER)
Admission: RE | Admit: 2023-01-06 | Discharge: 2023-01-06 | Disposition: A | Discharge status: Home / Self Care | Payer: Medicare Other | Source: Ambulatory Visit | Attending: Orthopedic Surgery | Admitting: Orthopedic Surgery

## 2023-01-06 DIAGNOSIS — M47812 Spondylosis without myelopathy or radiculopathy, cervical region: Secondary | ICD-10-CM | POA: Diagnosis not present

## 2023-01-06 DIAGNOSIS — M47811 Spondylosis without myelopathy or radiculopathy, occipito-atlanto-axial region: Secondary | ICD-10-CM | POA: Diagnosis not present

## 2023-01-06 DIAGNOSIS — M542 Cervicalgia: Secondary | ICD-10-CM | POA: Insufficient documentation

## 2023-01-06 DIAGNOSIS — S12040D Displaced lateral mass fracture of first cervical vertebra, subsequent encounter for fracture with routine healing: Secondary | ICD-10-CM | POA: Diagnosis not present

## 2023-01-06 DIAGNOSIS — S12101D Unspecified nondisplaced fracture of second cervical vertebra, subsequent encounter for fracture with routine healing: Secondary | ICD-10-CM | POA: Diagnosis not present

## 2023-01-08 ENCOUNTER — Ambulatory Visit (INDEPENDENT_AMBULATORY_CARE_PROVIDER_SITE_OTHER): Payer: Medicare Other | Admitting: Family Medicine

## 2023-01-08 ENCOUNTER — Encounter: Payer: Self-pay | Admitting: Family Medicine

## 2023-01-08 VITALS — BP 120/70 | HR 80 | Resp 16 | Ht 61.0 in | Wt 131.2 lb

## 2023-01-08 DIAGNOSIS — G8929 Other chronic pain: Secondary | ICD-10-CM | POA: Diagnosis not present

## 2023-01-08 DIAGNOSIS — M79604 Pain in right leg: Secondary | ICD-10-CM

## 2023-01-08 DIAGNOSIS — M79605 Pain in left leg: Secondary | ICD-10-CM | POA: Diagnosis not present

## 2023-01-08 DIAGNOSIS — M545 Low back pain, unspecified: Secondary | ICD-10-CM | POA: Diagnosis not present
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
## 2023-01-08 NOTE — Patient Instructions (Signed)
A few things to remember from today's visit:  Pain in both lower extremities  Chronic midline low back pain without sciatica  Let me know if you decide to try Gabapentin at bedtime. Try icy hot or vick vapor rub on legs at bedtime. Stretching exercises at bedtime may help.  If you need refills for medications you take chronically, please call your pharmacy. Do not use My Chart to request refills or for acute issues that need immediate attention. If you send a my chart message, it may take a few days to be addressed, specially if I am not in the office.  Please be sure medication list is accurate. If a new problem present, please set up appointment sooner than planned today.

## 2023-01-17 ENCOUNTER — Telehealth: Payer: Self-pay

## 2023-01-17 NOTE — Progress Notes (Signed)
01/17/2023  Patient ID: Patricia Moore, female   DOB: 1933-08-04, 89 y.o.   MRN: [id number]  Contacted [employer] to follow up on patient's ozempic application now that the prescriber portion had been updated. They explained that a brand new application would need to be started, and their new application drops 01/19/23.  Contacted patient to make her aware and see if she needed any ozempic samples, had to leave a voicemail.  Sherrill Raring, PharmD Clinical Pharmacist [phone]

## 2023-01-24 ENCOUNTER — Telehealth: Payer: Self-pay

## 2023-01-24 NOTE — Telephone Encounter (Signed)
PAP: Patient assistance application for Ozempic has been approved by PAP Companies: [employer] from 01/18/2023 to 04/17/2023. Medication should be delivered to PAP Delivery: Provider's office For further shipping updates, please contact [employer] at [phone] Pt ID is: [id number]

## 2023-01-24 NOTE — Progress Notes (Signed)
01/24/2023  Patient ID: Patricia Moore, female   DOB: 04/25/1933, 89 y.o.   MRN: [id number]  Contacted Ms. Patricia Moore via telephone with assistance of Spanish Interpreter (ID [phone], Marcelino Duster). Discussed that Ozempic PAP needs renewal, patient would like to come into the office to fill out the paperwork and obtain another sample while she waits for shipment.  Scheduled for 01/29/23 at 1pm  Sherrill Raring, PharmD Clinical Pharmacist [phone]

## 2023-01-29 ENCOUNTER — Ambulatory Visit (INDEPENDENT_AMBULATORY_CARE_PROVIDER_SITE_OTHER): Payer: Medicare Other

## 2023-01-29 DIAGNOSIS — E1121 Type 2 diabetes mellitus with diabetic nephropathy: Secondary | ICD-10-CM

## 2023-01-29 NOTE — Telephone Encounter (Signed)
Noted  

## 2023-01-29 NOTE — Progress Notes (Signed)
01/29/2023  Patient ID: Patricia Moore, female   DOB: [birth year], 89 y.o.   MRN: [id number]  Patient presented in office to pick up Ozempic 0.5mg  sample and to fill out Ozempic PAP through [employer] for 2025.   Sherrill Raring, PharmD Clinical Pharmacist [phone]

## 2023-01-31 ENCOUNTER — Ambulatory Visit: Payer: Medicare Other | Admitting: Orthopedic Surgery

## 2023-01-31 DIAGNOSIS — M542 Cervicalgia: Secondary | ICD-10-CM

## 2023-01-31 MED ORDER — TRAMADOL HCL 50 MG PO TABS: 50.0000 mg | ORAL_TABLET | Freq: Four times a day (QID) | ORAL | 0 refills | Status: AC | PRN

## 2023-01-31 NOTE — Progress Notes (Signed)
Orthopedic Spine Surgery Office Note   Assessment: Patient is a 89 y.o. female with chronic neck pain after a C1 and C2 fracture.  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    ___________________________________________________________________________     History:   Patient is a 89 y.o. female who presents today for follow up of C1 and C2 fractures.  She feels that her neck pain is getting slightly better but is taking significant time.  She is still using a soft collar when she is out of the house but is not using it otherwise.  She is taking minimal tramadol only when pain is bad.  She is using 1/2 tablet when she needs it.  She is not having any pain radiating to either upper extremity.     Treatments tried: collar use, activity modification, oxycodone, tylenol, NSAIDs, tramadol     Physical Exam:   General: no acute distress, appears stated age Neurologic: alert, answering questions appropriately, following commands Respiratory: unlabored breathing on room air, symmetric chest rise Psychiatric: appropriate affect, normal cadence to speech     MSK (spine):   -Strength exam                                                   Left                  Right Grip strength                5/5                  5/5 Interosseus                  5/5                  5/5 Wrist extension            5/5                  5/5 Wrist flexion                  5/5                  5/5 Elbow flexion                5/5                  5/5 Deltoid                          5/5                  4/5     -Sensory exam  Sensation intact to light touch in C5-T1 nerve distributions of bilateral upper extremities     Imaging: XR of the cervical spine from 06/15/2022 was previously independently reviewed and interpreted, showing ~71mm lateral mass overhang on the left side at C1/2. Displaced C2 lateral mass with depression. Decreased joint space at C1/2 on the left. No lateral mass overhang on the contralateral side. Degenerative changes throughout the cervical spine.   CT of the cervical spine from 01/06/2023 was independently reviewed and interpreted, showing interval osseous healing of C1 and C2 fractures when compared to CT scan on 02/01/2022.  There is degenerative changes with joint space narrowing and subchondral sclerosis at C1/C2 joint on the left.  Autofusion is seen across the C2/3 facet joints on the right.  Autofusion across the C4/5 facet joints on the left.  Facet arthropathy seen at multiple levels but worst at C3/4 on the left.  Disc height loss at C3/5, C5/6, C6/7.     Patient name: Patricia Moore Patient MRN: [id number] Date of visit: 01/31/23

## 2023-02-07 ENCOUNTER — Encounter: Payer: Self-pay | Admitting: Internal Medicine

## 2023-02-07 ENCOUNTER — Ambulatory Visit: Payer: Medicare Other | Admitting: Internal Medicine

## 2023-02-07 VITALS — BP 144/73 | HR 88 | Temp 97.8°F | Wt 134.2 lb

## 2023-02-07 DIAGNOSIS — F419 Anxiety disorder, unspecified: Secondary | ICD-10-CM

## 2023-02-07 DIAGNOSIS — L508 Other urticaria: Secondary | ICD-10-CM

## 2023-02-07 MED ORDER — TRIAMCINOLONE ACETONIDE 0.1 % EX CREA
1.0000 | TOPICAL_CREAM | Freq: Two times a day (BID) | CUTANEOUS | 0 refills | Status: DC
Start: 2023-02-07 — End: 2023-06-28

## 2023-02-07 MED ORDER — ALPRAZOLAM 0.25 MG PO TABS
ORAL_TABLET | ORAL | 1 refills | Status: DC
Start: 2023-02-07 — End: 2023-05-01

## 2023-02-07 NOTE — Progress Notes (Signed)
Established Patient Office Visit     CC/Reason for Visit: Rash on thighs  HPI: Patricia Moore is a 89 y.o. female who is coming in today for the above mentioned reasons.  For the past 3 years or so she has been having on appearance of a rash around her thighs and abdominal area.  She showed me pictures and these are classic urticaria.  They happen about every 2 to 3 months.  She has not been able to identify any triggers.  She last had these lesions about 10 days ago but have since fully resolved.  They are itchy, red, raised whelps.  Past Medical/Surgical History: Past Medical History:  Diagnosis Date   Anxiety 12/09/2013   Arthritis    Cataract    Diabetes mellitus    Diverticulitis    patient states has had three attacks    Diverticulosis    DJD (degenerative joint disease) 12/14/2014   Glaucoma    Hx of adenomatous colonic polyps    Hyperlipidemia    Hypertension    Intraductal papilloma    bx nef x 2 in the 70s-90s   Saccular aneurysm    SCC (squamous cell carcinoma) 03/2011   sees dermatology    Past Surgical History:  Procedure Laterality Date   BREAST BIOPSY  '91, '95   INTRADUCTAL PAPILLOMA   BREAST LUMPECTOMY     CATARACT EXTRACTION Bilateral 09/2014   CLOSED REDUCTION ULNAR SHAFT Right 10/06/2017   Procedure: CLOSED REDUCTION DISLOCATED RIGHT SHOULDER;  Surgeon: Kathryne Hitch, MD;  Location: [facility] OR;  Service: Orthopedics;  Laterality: Right;   COLONOSCOPY      Social History:  reports that she has never smoked. She has never used smokeless tobacco. She reports that she does not drink alcohol and does not use drugs.  Allergies: Allergies  Allergen Reactions   Ciprofloxacin Nausea And Vomiting    Family History:  Family History  Adopted: Yes  Problem Relation Age of Onset   Heart disease Mother 63   Heart disease Father 60   Skin cancer Sister    Colon cancer Maternal Grandfather        COLON,, FAMILY HX 7 MEMBERS   Breast cancer  Maternal Aunt    Stroke Maternal Aunt    Breast cancer Maternal Aunt    Colon cancer Paternal Uncle        7 uncles   Rectal cancer Neg Hx    Stomach cancer Neg Hx      Current Outpatient Medications:    carvedilol (COREG) 25 MG tablet, TAKE 1 TABLET(25 MG) BY MOUTH TWICE DAILY WITH A MEAL, Disp: 180 tablet, Rfl: 1   cholecalciferol (VITAMIN D) 1000 UNITS tablet, Take 1,000 Units by mouth daily., Disp: , Rfl:    co-enzyme Q-10 30 MG capsule, Take 30 mg by mouth daily., Disp: , Rfl:    dorzolamide-timolol (COSOPT) 22.3-6.8 MG/ML ophthalmic solution, Place 1 drop into both eyes 2 (two) times daily. , Disp: , Rfl:    glucose blood (ONETOUCH ULTRA) test strip, CHECK BLOOD SUGAR NO MORE THAN 2 TIMES PER DAY, Disp: 200 strip, Rfl: 12   metFORMIN (GLUCOPHAGE) 1000 MG tablet, TAKE 1 TABLET WITH BREAKFAST AND LUNCH THEN 1/2 TABLET AT BEDTIME, Disp: 225 tablet, Rfl: 1   OneTouch UltraSoft 2 Lancets MISC, 1 each by Other route 2 (two) times daily., Disp: 200 each, Rfl: 12   RESTASIS 0.05 % ophthalmic emulsion, Place 1 drop into both eyes as needed.,  Disp: , Rfl:    Semaglutide,0.25 or 0.5MG /DOS, 2 MG/3ML SOPN, INJECT 0.5 UNDER THE SKIN ONCE A WEEK, Disp: , Rfl:    triamcinolone cream (KENALOG) 0.1 %, Apply 1 Application topically 2 (two) times daily., Disp: 30 g, Rfl: 0   ALPRAZolam (XANAX) 0.25 MG tablet, TAKE 1 TABLET(0.25 MG) BY MOUTH DAILY AS NEEDED FOR ANXIETY, Disp: 30 tablet, Rfl: 1  Review of Systems:  Negative unless indicated in HPI.   Physical Exam: Vitals:   02/07/23 1007 02/07/23 1008  BP: (!) 140/70 (!) 144/73  Pulse: 88   Temp: 97.8 F (36.6 C)   TempSrc: Oral   SpO2: (!) 88%   Weight: 134 lb 3.2 oz (60.9 kg)     Body mass index is 25.36 kg/m.   Physical Exam Vitals reviewed.  Constitutional:      Appearance: Normal appearance.  HENT:     Head: Normocephalic and atraumatic.  Eyes:     Conjunctiva/sclera: Conjunctivae normal.     Pupils: Pupils are equal, round,  and reactive to light.  Cardiovascular:     Rate and Rhythm: Normal rate and regular rhythm.  Pulmonary:     Effort: Pulmonary effort is normal.     Breath sounds: Normal breath sounds.  Skin:    General: Skin is warm and dry.  Neurological:     General: No focal deficit present.     Mental Status: She is alert and oriented to person, place, and time.  Psychiatric:        Mood and Affect: Mood normal.        Behavior: Behavior normal.        Thought Content: Thought content normal.        Judgment: Judgment normal.      Impression and Plan:  Chronic urticaria -     Ambulatory referral to Allergy -     Triamcinolone Acetonide; Apply 1 Application topically 2 (two) times daily.  Dispense: 30 g; Refill: 0  Anxiety -     ALPRAZolam; TAKE 1 TABLET(0.25 MG) BY MOUTH DAILY AS NEEDED FOR ANXIETY  Dispense: 30 tablet; Refill: 1  -Suspect this to be chronic urticaria with unknown trigger.  Have advised daily antihistamine and triamcinolone cream as needed to affected areas. -Per request, referral to allergist has been placed.   Time spent:22 minutes reviewing chart, interviewing and examining patient and formulating plan of care.     Chaya Jan, MD [facility]

## 2023-02-09 DIAGNOSIS — Z1231 Encounter for screening mammogram for malignant neoplasm of breast: Secondary | ICD-10-CM | POA: Diagnosis not present

## 2023-02-12 ENCOUNTER — Encounter: Payer: Self-pay | Admitting: Obstetrics and Gynecology
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
## 2023-02-15 ENCOUNTER — Encounter: Payer: Self-pay | Admitting: Internal Medicine

## 2023-02-15 ENCOUNTER — Ambulatory Visit (INDEPENDENT_AMBULATORY_CARE_PROVIDER_SITE_OTHER): Payer: Medicare Other | Admitting: Internal Medicine

## 2023-02-15 VITALS — BP 110/64 | HR 85 | Temp 98.1°F | Wt 131.9 lb

## 2023-02-15 DIAGNOSIS — B372 Candidiasis of skin and nail: Secondary | ICD-10-CM

## 2023-02-15 NOTE — Progress Notes (Signed)
Established Patient Office Visit     CC/Reason for Visit: Itching under breasts  HPI: Patricia Moore is a 89 y.o. female who is coming in today for the above mentioned reasons.  For the past couple weeks she has been having an itchy, slimy rash under both breasts.  Past Medical/Surgical History: Past Medical History:  Diagnosis Date   Anxiety 12/09/2013   Arthritis    Cataract    Diabetes mellitus    Diverticulitis    patient states has had three attacks    Diverticulosis    DJD (degenerative joint disease) 12/14/2014   Glaucoma    Hx of adenomatous colonic polyps    Hyperlipidemia    Hypertension    Intraductal papilloma    bx nef x 2 in the 70s-90s   Saccular aneurysm    SCC (squamous cell carcinoma) 03/2011   sees dermatology    Past Surgical History:  Procedure Laterality Date   BREAST BIOPSY  '91, '95   INTRADUCTAL PAPILLOMA   BREAST LUMPECTOMY     CATARACT EXTRACTION Bilateral 09/2014   CLOSED REDUCTION ULNAR SHAFT Right 10/06/2017   Procedure: CLOSED REDUCTION DISLOCATED RIGHT SHOULDER;  Surgeon: Kathryne Hitch, MD;  Location: [facility] OR;  Service: Orthopedics;  Laterality: Right;   COLONOSCOPY      Social History:  reports that she has never smoked. She has never used smokeless tobacco. She reports that she does not drink alcohol and does not use drugs.  Allergies: Allergies  Allergen Reactions   Ciprofloxacin Nausea And Vomiting    Family History:  Family History  Adopted: Yes  Problem Relation Age of Onset   Heart disease Mother 65   Heart disease Father 36   Skin cancer Sister    Colon cancer Maternal Grandfather        COLON,, FAMILY HX 7 MEMBERS   Breast cancer Maternal Aunt    Stroke Maternal Aunt    Breast cancer Maternal Aunt    Colon cancer Paternal Uncle        7 uncles   Rectal cancer Neg Hx    Stomach cancer Neg Hx      Current Outpatient Medications:    ALPRAZolam (XANAX) 0.25 MG tablet, TAKE 1 TABLET(0.25 MG) BY  MOUTH DAILY AS NEEDED FOR ANXIETY, Disp: 30 tablet, Rfl: 1   carvedilol (COREG) 25 MG tablet, TAKE 1 TABLET(25 MG) BY MOUTH TWICE DAILY WITH A MEAL, Disp: 180 tablet, Rfl: 1   cholecalciferol (VITAMIN D) 1000 UNITS tablet, Take 1,000 Units by mouth daily., Disp: , Rfl:    co-enzyme Q-10 30 MG capsule, Take 30 mg by mouth daily., Disp: , Rfl:    dorzolamide-timolol (COSOPT) 22.3-6.8 MG/ML ophthalmic solution, Place 1 drop into both eyes 2 (two) times daily. , Disp: , Rfl:    glucose blood (ONETOUCH ULTRA) test strip, CHECK BLOOD SUGAR NO MORE THAN 2 TIMES PER DAY, Disp: 200 strip, Rfl: 12   metFORMIN (GLUCOPHAGE) 1000 MG tablet, TAKE 1 TABLET WITH BREAKFAST AND LUNCH THEN 1/2 TABLET AT BEDTIME, Disp: 225 tablet, Rfl: 1   OneTouch UltraSoft 2 Lancets MISC, 1 each by Other route 2 (two) times daily., Disp: 200 each, Rfl: 12   RESTASIS 0.05 % ophthalmic emulsion, Place 1 drop into both eyes as needed., Disp: , Rfl:    Semaglutide,0.25 or 0.5MG /DOS, 2 MG/3ML SOPN, INJECT 0.5 UNDER THE SKIN ONCE A WEEK, Disp: , Rfl:    triamcinolone cream (KENALOG) 0.1 %, Apply 1 Application  topically 2 (two) times daily., Disp: 30 g, Rfl: 0  Review of Systems:  Negative unless indicated in HPI.   Physical Exam: Vitals:   02/15/23 1446  BP: 110/64  Pulse: 85  Temp: 98.1 F (36.7 C)  TempSrc: Oral  SpO2: 98%  Weight: 131 lb 14.4 oz (59.8 kg)    Body mass index is 24.92 kg/m.   Physical Exam Skin:    Comments: Yeast dermatitis is apparent under both breasts.      Impression and Plan:  Yeast dermatitis   -We discussed terbinafine cream under clean, dry, under breast skin.  Time spent:22 minutes reviewing chart, interviewing and examining patient and formulating plan of care.     Chaya Jan, MD [facility]

## 2023-02-28 ENCOUNTER — Ambulatory Visit (INDEPENDENT_AMBULATORY_CARE_PROVIDER_SITE_OTHER): Payer: Medicare Other | Admitting: Obstetrics and Gynecology

## 2023-02-28 ENCOUNTER — Encounter: Payer: Self-pay | Admitting: Obstetrics and Gynecology

## 2023-02-28 VITALS — BP 130/82 | HR 76 | Ht 61.5 in | Wt 130.0 lb

## 2023-02-28 DIAGNOSIS — M8589 Other specified disorders of bone density and structure, multiple sites: Secondary | ICD-10-CM | POA: Diagnosis not present

## 2023-02-28 DIAGNOSIS — Z01419 Encounter for gynecological examination (general) (routine) without abnormal findings: Secondary | ICD-10-CM

## 2023-02-28 DIAGNOSIS — B372 Candidiasis of skin and nail: Secondary | ICD-10-CM | POA: Diagnosis not present

## 2023-02-28 MED ORDER — NYSTATIN 100000 UNIT/GM EX POWD: 1.0000 | Freq: Three times a day (TID) | CUTANEOUS | 1 refills | Status: DC

## 2023-02-28 NOTE — Patient Instructions (Signed)

## 2023-03-01 ENCOUNTER — Ambulatory Visit (INDEPENDENT_AMBULATORY_CARE_PROVIDER_SITE_OTHER): Payer: Medicare Other | Admitting: Allergy

## 2023-03-01 ENCOUNTER — Encounter: Payer: Self-pay | Admitting: Allergy

## 2023-03-01 VITALS — BP 150/84 | HR 72 | Temp 97.8°F | Resp 18 | Ht 61.0 in | Wt 133.0 lb

## 2023-03-01 DIAGNOSIS — L309 Dermatitis, unspecified: Secondary | ICD-10-CM | POA: Diagnosis not present

## 2023-03-01 DIAGNOSIS — L299 Pruritus, unspecified: Secondary | ICD-10-CM

## 2023-03-01 DIAGNOSIS — L509 Urticaria, unspecified: Secondary | ICD-10-CM | POA: Diagnosis not present

## 2023-03-01 DIAGNOSIS — R030 Elevated blood-pressure reading, without diagnosis of hypertension: Secondary | ICD-10-CM

## 2023-03-01 MED ORDER — FAMOTIDINE 20 MG PO TABS
20.0000 mg | ORAL_TABLET | Freq: Two times a day (BID) | ORAL | 2 refills | Status: DC
Start: 2023-03-01 — End: 2023-04-24
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
## 2023-03-01 NOTE — Patient Instructions (Addendum)
Hives: Keep track of episodes and take pictures of the rash.  Start zyrtec (cetirizine) 10mg  OR allegra (fexofenadine) 180mg  once a day.  If symptoms are not controlled or causes drowsiness let us know. Start Pepcid (famotidine) 20mg  twice a day.  Avoid the following potential triggers: alcohol, tight clothing, NSAIDs, hot showers and getting overheated. See below for proper skin care.  Get bloodwork. We are ordering labs, so please allow 1-2 weeks for the results to come back. With the newly implemented Cures Act, the labs might be visible to you at the same time that they become visible to me. However, I will not address the results until all of the results are back, so please be patient.   Elevated blood pressure  Blood pressure reading was high in our office today. Vitals:   03/01/23 1427 03/01/23 1525  BP: (!) 152/82 (!) 150/84   Please follow up with PCP regarding this.    Return in about 6 weeks (around 04/12/2023). Or sooner if needed.  Skin care recommendations  Bath time: Always use lukewarm water. AVOID very hot or cold water. Keep bathing time to 5-10 minutes. Do NOT use bubble bath. Use a mild soap and use just enough to wash the dirty areas. Do NOT scrub skin vigorously.  After bathing, pat dry your skin with a towel. Do NOT rub or scrub the skin.  Moisturizers and prescriptions:  ALWAYS apply moisturizers immediately after bathing (within 3 minutes). This helps to lock-in moisture. Use the moisturizer several times a day over the whole body. Good summer moisturizers include: Aveeno, CeraVe, Cetaphil. Good winter moisturizers include: Aquaphor, Vaseline, Cerave, Cetaphil, Eucerin, Vanicream. When using moisturizers along with medications, the moisturizer should be applied about one hour after applying the medication to prevent diluting effect of the medication or moisturize around where you applied the medications. When not using medications, the moisturizer can be  continued twice daily as maintenance.  Laundry and clothing: Avoid laundry products with added color or perfumes. Use unscented hypo-allergenic laundry products such as Tide free, Cheer free & gentle, and All free and clear.  If the skin still seems dry or sensitive, you can try double-rinsing the clothes. Avoid tight or scratchy clothing such as wool. Do not use fabric softeners or dyer sheets.

## 2023-03-06 DIAGNOSIS — L509 Urticaria, unspecified: Secondary | ICD-10-CM | POA: Diagnosis not present

## 2023-03-06 DIAGNOSIS — L299 Pruritus, unspecified: Secondary | ICD-10-CM | POA: Diagnosis not present

## 2023-03-06 DIAGNOSIS — L309 Dermatitis, unspecified: Secondary | ICD-10-CM | POA: Diagnosis not present

## 2023-03-14 LAB — PROTEIN ELECTROPHORESIS, SERUM
A/G Ratio: 1.3 (ref 0.7–1.7)
Albumin ELP: 4.4 g/dL (ref 2.9–4.4)
Alpha 1: 0.1 g/dL (ref 0.0–0.4)
Alpha 2: 0.7 g/dL (ref 0.4–1.0)
Beta: 1.2 g/dL (ref 0.7–1.3)
Gamma Globulin: 1.4 g/dL (ref 0.4–1.8)
Globulin, Total: 3.4 g/dL (ref 2.2–3.9)
M-Spike, %: 0.4 g/dL — ABNORMAL HIGH

## 2023-03-14 LAB — COMPREHENSIVE METABOLIC PANEL
ALT: 13 [IU]/L (ref 0–32)
AST: 23 [IU]/L (ref 0–40)
Albumin: 4.7 g/dL (ref 3.7–4.7)
Alkaline Phosphatase: 74 [IU]/L (ref 44–121)
BUN/Creatinine Ratio: 28 (ref 12–28)
BUN: 23 mg/dL (ref 8–27)
Bilirubin Total: 0.4 mg/dL (ref 0.0–1.2)
CO2: 29 mmol/L (ref 20–29)
Calcium: 10.7 mg/dL — ABNORMAL HIGH (ref 8.7–10.3)
Chloride: 97 mmol/L (ref 96–106)
Creatinine, Ser: 0.81 mg/dL (ref 0.57–1.00)
Globulin, Total: 3.1 g/dL (ref 1.5–4.5)
Glucose: 108 mg/dL — ABNORMAL HIGH (ref 70–99)
Potassium: 4.7 mmol/L (ref 3.5–5.2)
Sodium: 138 mmol/L (ref 134–144)
Total Protein: 7.8 g/dL (ref 6.0–8.5)
eGFR: 70 mL/min/{1.73_m2} (ref >59)

## 2023-03-14 LAB — CBC WITH DIFFERENTIAL/PLATELET
Basophils Absolute: 0.1 10*3/uL (ref 0.0–0.2)
Basos: 1 %
EOS (ABSOLUTE): 0.4 10*3/uL (ref 0.0–0.4)
Eos: 5 %
Hematocrit: 41.7 % (ref 34.0–46.6)
Hemoglobin: 13.6 g/dL (ref 11.1–15.9)
Immature Grans (Abs): 0 10*3/uL (ref 0.0–0.1)
Immature Granulocytes: 0 %
Lymphocytes Absolute: 2 10*3/uL (ref 0.7–3.1)
Lymphs: 26 %
MCH: 30.7 pg (ref 26.6–33.0)
MCHC: 32.6 g/dL (ref 31.5–35.7)
MCV: 94 fL (ref 79–97)
Monocytes Absolute: 0.6 10*3/uL (ref 0.1–0.9)
Monocytes: 8 %
Neutrophils Absolute: 4.6 10*3/uL (ref 1.4–7.0)
Neutrophils: 60 %
Platelets: 305 10*3/uL (ref 150–450)
RBC: 4.43 x10E6/uL (ref 3.77–5.28)
RDW: 11.4 % — ABNORMAL LOW (ref 11.7–15.4)
WBC: 7.7 10*3/uL (ref 3.4–10.8)

## 2023-03-14 LAB — ALPHA-GAL PANEL
Allergen Lamb IgE: <0.1 kU/L
Beef IgE: <0.1 kU/L
IgE (Immunoglobulin E), Serum: 2036 [IU]/mL — ABNORMAL HIGH (ref 6–495)
O215-IgE Alpha-Gal: <0.1 kU/L
Pork IgE: <0.1 kU/L

## 2023-03-14 LAB — C-REACTIVE PROTEIN: CRP: 2 mg/L (ref 0–10)

## 2023-03-14 LAB — PROTEIN ELECTROPHORESIS, URINE REFLEX
Albumin ELP, Urine: 100 %
Alpha-1-Globulin, U: 0 %
Alpha-2-Globulin, U: 0 %
Beta Globulin, U: 0 %
Gamma Globulin, U: 0 %
Protein, Ur: 5.8 mg/dL

## 2023-03-14 LAB — ENA+DNA/DS+SJORGEN'S
ENA RNP Ab: 0.2 AI (ref 0.0–0.9)
ENA SM Ab Ser-aCnc: <0.2 AI (ref 0.0–0.9)
ENA SSA (RO) Ab: <0.2 AI (ref 0.0–0.9)
ENA SSB (LA) Ab: <0.2 AI (ref 0.0–0.9)
dsDNA Ab: 12 [IU]/mL — ABNORMAL HIGH (ref 0–9)

## 2023-03-14 LAB — CHRONIC URTICARIA: cu index: 5.4 (ref <10)

## 2023-03-14 LAB — TRYPTASE: Tryptase: 3.2 ug/L (ref 2.2–13.2)

## 2023-03-14 LAB — ANA W/REFLEX: Anti Nuclear Antibody (ANA): POSITIVE — AB

## 2023-03-14 LAB — SEDIMENTATION RATE: Sed Rate: 11 mm/h (ref 0–40)

## 2023-03-14 LAB — C3 AND C4
Complement C3, Serum: 137 mg/dL (ref 82–167)
Complement C4, Serum: 26 mg/dL (ref 12–38)

## 2023-03-19 ENCOUNTER — Encounter: Payer: Self-pay | Admitting: Internal Medicine

## 2023-03-19 ENCOUNTER — Ambulatory Visit (INDEPENDENT_AMBULATORY_CARE_PROVIDER_SITE_OTHER): Payer: Medicare Other | Admitting: Internal Medicine

## 2023-03-19 VITALS — BP 110/80 | HR 65 | Temp 97.7°F | Wt 129.1 lb

## 2023-03-19 DIAGNOSIS — E785 Hyperlipidemia, unspecified: Secondary | ICD-10-CM

## 2023-03-19 DIAGNOSIS — E1121 Type 2 diabetes mellitus with diabetic nephropathy: Secondary | ICD-10-CM | POA: Diagnosis not present

## 2023-03-19 DIAGNOSIS — I10 Essential (primary) hypertension: Secondary | ICD-10-CM

## 2023-03-19 LAB — LIPID PANEL
Cholesterol: 224 mg/dL — ABNORMAL HIGH (ref 0–200)
HDL: 65.6 mg/dL (ref >39.00)
LDL Cholesterol: 136 mg/dL — ABNORMAL HIGH (ref 0–99)
NonHDL: 157.98
Total CHOL/HDL Ratio: 3
Triglycerides: 110 mg/dL (ref 0.0–149.0)
VLDL: 22 mg/dL (ref 0.0–40.0)

## 2023-03-19 LAB — POCT GLYCOSYLATED HEMOGLOBIN (HGB A1C): Hemoglobin A1C: 5.9 % — AB (ref 4.0–5.6)

## 2023-03-19 NOTE — Progress Notes (Signed)
Established Patient Office Visit     CC/Reason for Visit: Follow-up chronic conditions  HPI: Patricia Moore is a 89 y.o. female who is coming in today for the above mentioned reasons. Past Medical History is significant for: Type 2 diabetes, hyperlipidemia not on medication, hypertension.  Feeling well, no major concerns or complaints.   Past Medical/Surgical History: Past Medical History:  Diagnosis Date   Anxiety 12/09/2013   Arthritis    Cataract    Diabetes mellitus    Diverticulitis    patient states has had three attacks    Diverticulosis    DJD (degenerative joint disease) 12/14/2014   Glaucoma    Hx of adenomatous colonic polyps    Hyperlipidemia    Hypertension    Intraductal papilloma    bx nef x 2 in the 70s-90s   Saccular aneurysm    SCC (squamous cell carcinoma) 03/2011   sees dermatology    Past Surgical History:  Procedure Laterality Date   BREAST BIOPSY  '91, '95   INTRADUCTAL PAPILLOMA   BREAST LUMPECTOMY     CATARACT EXTRACTION Bilateral 09/2014   CLOSED REDUCTION ULNAR SHAFT Right 10/06/2017   Procedure: CLOSED REDUCTION DISLOCATED RIGHT SHOULDER;  Surgeon: Kathryne Hitch, MD;  Location: [facility] OR;  Service: Orthopedics;  Laterality: Right;   COLONOSCOPY      Social History:  reports that she has never smoked. She has never used smokeless tobacco. She reports that she does not drink alcohol and does not use drugs.  Allergies: Allergies  Allergen Reactions   Ciprofloxacin Nausea And Vomiting    Family History:  Family History  Adopted: Yes  Problem Relation Age of Onset   Heart disease Mother 62   Heart disease Father 32   Skin cancer Sister    Colon cancer Maternal Grandfather        COLON,, FAMILY HX 7 MEMBERS   Breast cancer Maternal Aunt    Stroke Maternal Aunt    Breast cancer Maternal Aunt    Colon cancer Paternal Uncle        7 uncles   Rectal cancer Neg Hx    Stomach cancer Neg Hx      Current Outpatient  Medications:    ALPRAZolam (XANAX) 0.25 MG tablet, TAKE 1 TABLET(0.25 MG) BY MOUTH DAILY AS NEEDED FOR ANXIETY, Disp: 30 tablet, Rfl: 1   carvedilol (COREG) 25 MG tablet, TAKE 1 TABLET(25 MG) BY MOUTH TWICE DAILY WITH A MEAL, Disp: 180 tablet, Rfl: 1   cholecalciferol (VITAMIN D) 1000 UNITS tablet, Take 1,000 Units by mouth daily., Disp: , Rfl:    co-enzyme Q-10 30 MG capsule, Take 30 mg by mouth daily., Disp: , Rfl:    dorzolamide-timolol (COSOPT) 22.3-6.8 MG/ML ophthalmic solution, Place 1 drop into both eyes 2 (two) times daily. , Disp: , Rfl:    famotidine (PEPCID) 20 MG tablet, Take 1 tablet (20 mg total) by mouth 2 (two) times daily., Disp: 60 tablet, Rfl: 2   glucose blood (ONETOUCH ULTRA) test strip, CHECK BLOOD SUGAR NO MORE THAN 2 TIMES PER DAY, Disp: 200 strip, Rfl: 12   metFORMIN (GLUCOPHAGE) 1000 MG tablet, TAKE 1 TABLET WITH BREAKFAST AND LUNCH THEN 1/2 TABLET AT BEDTIME, Disp: 225 tablet, Rfl: 1   OneTouch UltraSoft 2 Lancets MISC, 1 each by Other route 2 (two) times daily., Disp: 200 each, Rfl: 12   RESTASIS 0.05 % ophthalmic emulsion, Place 1 drop into both eyes as needed., Disp: , Rfl:  Semaglutide,0.25 or 0.5MG /DOS, 2 MG/3ML SOPN, INJECT 0.5 UNDER THE SKIN ONCE A WEEK, Disp: , Rfl:    triamcinolone cream (KENALOG) 0.1 %, Apply 1 Application topically 2 (two) times daily., Disp: 30 g, Rfl: 0  Review of Systems:  Negative unless indicated in HPI.   Physical Exam: Vitals:   03/19/23 1025  BP: 110/80  Pulse: 65  Temp: 97.7 F (36.5 C)  TempSrc: Oral  SpO2: 99%  Weight: 129 lb 1.6 oz (58.6 kg)    Body mass index is 24.39 kg/m.   Physical Exam Vitals reviewed.  Constitutional:      Appearance: Normal appearance.  HENT:     Head: Normocephalic and atraumatic.  Eyes:     Conjunctiva/sclera: Conjunctivae normal.     Pupils: Pupils are equal, round, and reactive to light.  Cardiovascular:     Rate and Rhythm: Normal rate and regular rhythm.  Pulmonary:      Effort: Pulmonary effort is normal.     Breath sounds: Normal breath sounds.  Skin:    General: Skin is warm and dry.  Neurological:     General: No focal deficit present.     Mental Status: She is alert and oriented to person, place, and time.  Psychiatric:        Mood and Affect: Mood normal.        Behavior: Behavior normal.        Thought Content: Thought content normal.        Judgment: Judgment normal.      Impression and Plan:  Controlled type 2 diabetes mellitus with diabetic nephropathy, without long-term current use of insulin (HCC) -     POCT glycosylated hemoglobin (Hb A1C)  Dyslipidemia -     Lipid panel; Future  Essential hypertension  -Blood pressure is well-controlled on current. -She decided not to start atorvastatin and has been working on lifestyle changes.  She would like to recheck lipid panel today before making decision on medication. -A1c of 5.9 shows excellent diabetic control.   Time spent:31 minutes reviewing chart, interviewing and examining patient and formulating plan of care.     Chaya Jan, MD [facility]

## 2023-03-19 NOTE — Telephone Encounter (Signed)
Left message on machine for patient that samples are available to pick up.
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
## 2023-04-02 ENCOUNTER — Ambulatory Visit: Payer: Medicare Other | Admitting: Neurology

## 2023-04-02 VITALS — BP 150/80 | HR 72 | Ht 61.0 in | Wt 130.0 lb

## 2023-04-02 DIAGNOSIS — S12090D Other displaced fracture of first cervical vertebra, subsequent encounter for fracture with routine healing: Secondary | ICD-10-CM

## 2023-04-02 NOTE — Patient Instructions (Signed)
 Follow up 1 year or as needed

## 2023-04-03 ENCOUNTER — Encounter: Payer: Self-pay | Admitting: Neurology

## 2023-04-04 ENCOUNTER — Telehealth: Payer: Self-pay

## 2023-04-04 ENCOUNTER — Ambulatory Visit: Payer: Medicare Other | Admitting: Orthopedic Surgery

## 2023-04-04 DIAGNOSIS — S12040D Displaced lateral mass fracture of first cervical vertebra, subsequent encounter for fracture with routine healing: Secondary | ICD-10-CM | POA: Diagnosis not present

## 2023-04-04 DIAGNOSIS — S12040S Displaced lateral mass fracture of first cervical vertebra, sequela: Secondary | ICD-10-CM | POA: Diagnosis not present

## 2023-04-04 MED ORDER — TRAMADOL HCL 50 MG PO TABS: 50.0000 mg | ORAL_TABLET | Freq: Four times a day (QID) | ORAL | 0 refills | Status: AC | PRN

## 2023-04-04 NOTE — Telephone Encounter (Signed)
Received pt application waiting on doctors portion.

## 2023-04-04 NOTE — Progress Notes (Signed)
Orthopedic Spine Surgery Office Note   Assessment: Patient is a 89 y.o. female with chronic neck pain after a C1 and C2 fracture. Is happy with sparing tramadol use and soft collar. Does not want any kind of surgical intervention     Plan: -New prescription for tramadol provided to her today -Continue with soft collar for particularly painful days -Out of bed as tolerated, no collar needed -Patient should return to office in 3 months, x-rays at next visit: none     Patient expressed understanding of the plan and all questions were answered to the patient's satisfaction.    ___________________________________________________________________________     History:   Patient is a 89 y.o. female who presents today for follow up of C1 and C2 fractures. Pain is similar to her last visit. Still having pain in her neck. She has not needed any refills on her tramadol since she was last seen. She is using it sparingly. She still uses a soft collar if she is more active or in her car. Otherwise, she is out of it. No pain radiating into her upper extremities. She has not developed any new symptoms since she was last seen.     Treatments tried: collar use, activity modification, oxycodone, tylenol, NSAIDs, tramadol     Physical Exam:   General: no acute distress, appears stated age Neurologic: alert, answering questions appropriately, following commands Respiratory: unlabored breathing on room air, symmetric chest rise Psychiatric: appropriate affect, normal cadence to speech     MSK (spine):   -Strength exam                                                   Left                  Right Grip strength                5/5                  5/5 Interosseus                  5/5                  5/5 Wrist extension            5/5                  5/5 Wrist flexion                 5/5                  5/5 Elbow flexion                5/5                  5/5 Deltoid                          5/5                   4/5     -Sensory exam                         Sensation intact to light touch in C5-T1 nerve distributions of bilateral upper extremities  Imaging: XR of the cervical spine from 06/15/2022 was previously independently reviewed and interpreted, showing ~13mm lateral mass overhang on the left side at C1/2. Displaced C2 lateral mass with depression. Decreased joint space at C1/2 on the left. No lateral mass overhang on the contralateral side. Degenerative changes throughout the cervical spine.    CT of the cervical spine from 01/06/2023 was previously independently reviewed and interpreted, showing interval osseous healing of C1 and C2 fractures when compared to CT scan on 02/01/2022.  There is degenerative changes with joint space narrowing and subchondral sclerosis at C1/C2 joint on the left.  Autofusion is seen across the C2/3 facet joints on the right.  Autofusion across the C4/5 facet joints on the left.  Facet arthropathy seen at multiple levels but worst at C3/4 on the left.  Disc height loss at C3/5, C5/6, C6/7.     Patient name: Patricia Moore Patient MRN: [id number] Date of visit: 04/04/23

## 2023-04-24 ENCOUNTER — Encounter: Payer: Self-pay | Admitting: Allergy

## 2023-04-24 ENCOUNTER — Ambulatory Visit: Payer: Medicare Other | Admitting: Allergy

## 2023-04-24 VITALS — BP 92/62 | HR 76 | Temp 98.2°F | Resp 16

## 2023-04-24 DIAGNOSIS — L509 Urticaria, unspecified: Secondary | ICD-10-CM

## 2023-04-24 NOTE — Patient Instructions (Addendum)
 Referral to rheumatology for elevated DsDNA ab.   Hives: Keep track of episodes and take pictures of the rash.  If you start breaking out again:  Start zyrtec (cetirizine) 10mg  OR allegra (fexofenadine) 180mg  once a day.  Start Pepcid  (famotidine ) 20mg  twice a day.   Avoid the following potential triggers: alcohol, tight clothing, NSAIDs, hot showers and getting overheated. See below for proper skin care.  Get urine test We are ordering labs, so please allow 1-2 weeks for the results to come back. With the newly implemented Cures Act, the labs might be visible to you at the same time that they become visible to me. However, I will not address the results until all of the results are back, so please be patient.   Return if symptoms worsen or fail to improve. Or sooner if needed.  Skin care recommendations  Bath time: Always use lukewarm water. AVOID very hot or cold water. Keep bathing time to 5-10 minutes. Do NOT use bubble bath. Use a mild soap and use just enough to wash the dirty areas. Do NOT scrub skin vigorously.  After bathing, pat dry your skin with a towel. Do NOT rub or scrub the skin.  Moisturizers and prescriptions:  ALWAYS apply moisturizers immediately after bathing (within 3 minutes). This helps to lock-in moisture. Use the moisturizer several times a day over the whole body. Good summer moisturizers include: Aveeno, CeraVe, Cetaphil. Good winter moisturizers include: Aquaphor, Vaseline, Cerave, Cetaphil, Eucerin, Vanicream. When using moisturizers along with medications, the moisturizer should be applied about one hour after applying the medication to prevent diluting effect of the medication or moisturize around where you applied the medications. When not using medications, the moisturizer can be continued twice daily as maintenance.  Laundry and clothing: Avoid laundry products with added color or perfumes. Use unscented hypo-allergenic laundry products such as  Tide free, Cheer free & gentle, and All free and clear.  If the skin still seems dry or sensitive, you can try double-rinsing the clothes. Avoid tight or scratchy clothing such as wool. Do not use fabric softeners or dyer sheets.

## 2023-04-24 NOTE — Progress Notes (Signed)
 Follow Up Note  RE: Patricia Moore MRN: [id number] DOB: [birth year] Date of Office Visit: 04/24/2023  Referring provider: Theophilus Andrews, Jonna* Primary care provider: Theophilus Andrews, Tully GRADE, MD  Chief Complaint: Urticaria  History of Present Illness: I had the pleasure of seeing Patricia Moore for a follow up visit at the [facility] on 04/24/2023. She is a 89 y.o. female, who is being followed for urticaria. Her previous allergy office visit was on 03/01/2023 with Dr. Luke. Today is a regular follow up visit. Patient declined interpreter services.   Discussed the use of AI scribe software for clinical note transcription with the patient, who gave verbal consent to proceed.  The patient, with a history of a rash, was last seen a couple of months ago. She reports that the rash has since improved, with only one episode of recurrence on the side. She was prescribed Famotidine , which she took for approximately two months, finishing the course about a week ago. She reports no further itching or rash since discontinuing bot famotidine  and Allegra.   The patient also has a history of hypertension, for which she takes blood pressure medication twice a day. She reports that her blood pressure has been slightly elevated recently due to familial stress. She has not had any changes to her blood pressure medication regimen.     Assessment and Plan: Clark is a 89 y.o. female with: Urticaria Past history - recurrent, pruritic, erythematous rash of unknown etiology, localized to the upper torso, with episodes lasting approximately 4-5 days. The rash has been occurring for over a year, with increased frequency in the past month. No known triggers or alleviating factors. No history of eczema, asthma, allergies, or significant illness. No changes in medications, diet, or environment. Reviewed images - looks like urticaria. Interim history - improved since last OV so patient weaned off  antihistamines completely x 1 week now with no flares. 2024 bloodwork unremarkable except slightly elevated dsDNA ab, urine protein.  Keep track of episodes and take pictures of the rash.  If you start breaking out again:  Start zyrtec (cetirizine) 10mg  OR allegra (fexofenadine) 180mg  once a day.  Start Pepcid  (famotidine ) 20mg  twice a day.  Avoid the following potential triggers: alcohol, tight clothing, NSAIDs, hot showers and getting overheated. See below for proper skin care.  Get urine test  Referral to rheumatology for elevated DsDNA ab - rule out rheumatological conditions.    Return if symptoms worsen or fail to improve.  No orders of the defined types were placed in this encounter.  Lab Orders         Protein Electrophoresis, Urine Rflx.      Diagnostics: None.   Medication List:  Current Outpatient Medications  Medication Sig Dispense Refill   ALPRAZolam  (XANAX ) 0.25 MG tablet TAKE 1 TABLET(0.25 MG) BY MOUTH DAILY AS NEEDED FOR ANXIETY 30 tablet 1   carvedilol  (COREG ) 25 MG tablet TAKE 1 TABLET(25 MG) BY MOUTH TWICE DAILY WITH A MEAL 180 tablet 1   cholecalciferol (VITAMIN D ) 1000 UNITS tablet Take 1,000 Units by mouth daily.     co-enzyme Q-10 30 MG capsule Take 30 mg by mouth daily.     dorzolamide-timolol (COSOPT) 22.3-6.8 MG/ML ophthalmic solution Place 1 drop into both eyes 2 (two) times daily.      glucose blood (ONETOUCH ULTRA) test strip CHECK BLOOD SUGAR NO MORE THAN 2 TIMES PER DAY 200 strip 12   metFORMIN  (GLUCOPHAGE ) 1000 MG tablet TAKE  1 TABLET WITH BREAKFAST AND LUNCH THEN 1/2 TABLET AT BEDTIME 225 tablet 1   OneTouch UltraSoft 2 Lancets MISC 1 each by Other route 2 (two) times daily. 200 each 12   RESTASIS 0.05 % ophthalmic emulsion Place 1 drop into both eyes as needed.     Semaglutide ,0.25 or 0.5MG /DOS, 2 MG/3ML SOPN INJECT 0.5 UNDER THE SKIN ONCE A WEEK     triamcinolone  cream (KENALOG ) 0.1 % Apply 1 Application topically 2 (two) times daily. 30 g 0    No current facility-administered medications for this visit.   Allergies: Allergies  Allergen Reactions   Ciprofloxacin  Nausea And Vomiting   I reviewed her past medical history, social history, family history, and environmental history and no significant changes have been reported from her previous visit.  Review of Systems  Constitutional:  Negative for appetite change, chills, fever and unexpected weight change.  HENT:  Negative for congestion and rhinorrhea.   Eyes:  Negative for itching.  Respiratory:  Negative for cough, chest tightness, shortness of breath and wheezing.   Cardiovascular:  Negative for chest pain.  Gastrointestinal:  Negative for abdominal pain.  Genitourinary:  Negative for difficulty urinating.  Skin:  Negative for rash.  Neurological:  Negative for headaches.    Objective: BP 92/62   Pulse 76   Temp 98.2 F (36.8 C) (Temporal)   Resp 16   LMP 04/17/1988   SpO2 98%  There is no height or weight on file to calculate BMI. Physical Exam Vitals and nursing note reviewed.  Constitutional:      Appearance: Normal appearance. She is well-developed.  HENT:     Head: Normocephalic and atraumatic.     Right Ear: Tympanic membrane and external ear normal.     Left Ear: Tympanic membrane and external ear normal.     Nose: Nose normal.     Mouth/Throat:     Mouth: Mucous membranes are moist.     Pharynx: Oropharynx is clear.  Eyes:     Conjunctiva/sclera: Conjunctivae normal.  Cardiovascular:     Rate and Rhythm: Normal rate and regular rhythm.     Heart sounds: Normal heart sounds. No murmur heard.    No friction rub. No gallop.  Pulmonary:     Effort: Pulmonary effort is normal.     Breath sounds: Normal breath sounds. No wheezing, rhonchi or rales.  Musculoskeletal:     Cervical back: Neck supple.  Skin:    General: Skin is warm.     Findings: No rash.  Neurological:     Mental Status: She is alert and oriented to person, place, and time.   Psychiatric:        Behavior: Behavior normal.    Previous notes and tests were reviewed. The plan was reviewed with the patient/family, and all questions/concerned were addressed.  It was my pleasure to see Patricia Moore today and participate in her care. Please feel free to contact me with any questions or concerns.  Sincerely,  Orlan Cramp, DO Allergy & Immunology  [facility]  [facility] office: [phone] [facility] office: [phone]

## 2023-05-01 ENCOUNTER — Ambulatory Visit (INDEPENDENT_AMBULATORY_CARE_PROVIDER_SITE_OTHER): Payer: Medicare Other | Admitting: Internal Medicine

## 2023-05-01 ENCOUNTER — Encounter: Payer: Self-pay | Admitting: Internal Medicine

## 2023-05-01 VITALS — BP 120/78 | HR 78 | Temp 97.8°F | Wt 133.1 lb

## 2023-05-01 DIAGNOSIS — M542 Cervicalgia: Secondary | ICD-10-CM

## 2023-05-01 DIAGNOSIS — F419 Anxiety disorder, unspecified: Secondary | ICD-10-CM

## 2023-05-01 DIAGNOSIS — G8929 Other chronic pain: Secondary | ICD-10-CM | POA: Diagnosis not present

## 2023-05-01 DIAGNOSIS — L509 Urticaria, unspecified: Secondary | ICD-10-CM | POA: Diagnosis not present

## 2023-05-01 MED ORDER — ALPRAZOLAM 0.25 MG PO TABS: ORAL_TABLET | ORAL | 1 refills | Status: DC
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
## 2023-05-04 ENCOUNTER — Telehealth: Payer: Self-pay

## 2023-05-04 NOTE — Telephone Encounter (Signed)
Pt return phone call,pt explain she has been advise by Terrance Mass has been APPROVED on [employer] for 2025.

## 2023-05-04 NOTE — Telephone Encounter (Signed)
-----   Message from [facility] sent at 04/24/2023  5:54 PM EST -----  ----- Message ----- From: Ellamae Sia, DO Sent: 04/24/2023   5:46 PM EST To: [facility]  Please place referral to rheumatology - elevated DsDNA ab and rash. R/o sle.

## 2023-05-10 ENCOUNTER — Telehealth: Payer: Self-pay | Admitting: *Deleted

## 2023-05-10 ENCOUNTER — Other Ambulatory Visit: Payer: Self-pay | Admitting: Internal Medicine

## 2023-05-10 DIAGNOSIS — I10 Essential (primary) hypertension: Secondary | ICD-10-CM

## 2023-05-10 DIAGNOSIS — E119 Type 2 diabetes mellitus without complications: Secondary | ICD-10-CM

## 2023-05-10 NOTE — Telephone Encounter (Signed)
Left message on machine for patient to inform her that her samples of Ozempic is available for pick up.

## 2023-05-11 LAB — PROTEIN ELECTROPHORESIS, URINE REFLEX
Albumin ELP, Urine: 100 %
Alpha-1-Globulin, U: 0 %
Alpha-2-Globulin, U: 0 %
Beta Globulin, U: 0 %
Gamma Globulin, U: 0 %
Protein, Ur: 13.4 mg/dL

## 2023-05-15 ENCOUNTER — Ambulatory Visit: Payer: Medicare Other | Admitting: Internal Medicine

## 2023-05-15 ENCOUNTER — Other Ambulatory Visit: Payer: Self-pay | Admitting: Internal Medicine

## 2023-05-15 ENCOUNTER — Other Ambulatory Visit: Payer: Medicare Other

## 2023-05-15 DIAGNOSIS — E1121 Type 2 diabetes mellitus with diabetic nephropathy: Secondary | ICD-10-CM | POA: Diagnosis not present

## 2023-05-15 NOTE — Telephone Encounter (Signed)
Spoke to patient and she stated she's going to see her primary care provider to see if she really needs to schedule an appointment with Rheumatology. She stated she will stop by our office in [facility] today after her appointment with her PCP to let us know what he/she said.  Patricia Moore [phone]

## 2023-05-15 NOTE — Telephone Encounter (Signed)
I followed up on the patients referral and their office has tried to contact her 3 times to scheduled. I called the spanish interpreter line to inform her. They called twice and patient didn't answer. A voicemail was left for the patient to call our office.   I want to inform the patient to contact the Rheumatologist.

## 2023-05-16 ENCOUNTER — Encounter: Payer: Self-pay | Admitting: Internal Medicine

## 2023-05-16 LAB — COMPLETE METABOLIC PANEL WITH GFR
AG Ratio: 1.3 (calc) (ref 1.0–2.5)
ALT: 19 U/L (ref 6–29)
AST: 26 U/L (ref 10–35)
Albumin: 4.4 g/dL (ref 3.6–5.1)
Alkaline phosphatase (APISO): 74 U/L (ref 37–153)
BUN: 23 mg/dL (ref 7–25)
CO2: 29 mmol/L (ref 20–32)
Calcium: 10.7 mg/dL — ABNORMAL HIGH (ref 8.6–10.4)
Chloride: 98 mmol/L (ref 98–110)
Creat: 0.95 mg/dL (ref 0.60–0.95)
Globulin: 3.4 g/dL (ref 1.9–3.7)
Glucose, Bld: 119 mg/dL — ABNORMAL HIGH (ref 65–99)
Potassium: 4.5 mmol/L (ref 3.5–5.3)
Sodium: 137 mmol/L (ref 135–146)
Total Bilirubin: 0.4 mg/dL (ref 0.2–1.2)
Total Protein: 7.8 g/dL (ref 6.1–8.1)
eGFR: 58 mL/min/{1.73_m2} — ABNORMAL LOW (ref >=60)

## 2023-05-29 ENCOUNTER — Encounter: Payer: Self-pay | Admitting: Internal Medicine

## 2023-05-29 ENCOUNTER — Ambulatory Visit (INDEPENDENT_AMBULATORY_CARE_PROVIDER_SITE_OTHER): Payer: Medicare Other | Admitting: Internal Medicine

## 2023-05-29 VITALS — BP 110/70 | HR 70 | Temp 97.9°F | Wt 135.8 lb

## 2023-05-29 DIAGNOSIS — R809 Proteinuria, unspecified: Secondary | ICD-10-CM | POA: Diagnosis not present

## 2023-05-29 DIAGNOSIS — R768 Other specified abnormal immunological findings in serum: Secondary | ICD-10-CM | POA: Diagnosis not present

## 2023-05-29 NOTE — Progress Notes (Signed)
Established Patient Office Visit     CC/Reason for Visit: Requesting referrals  HPI: Patricia Moore is a 89 y.o. female who is coming in today for the above mentioned reasons.  She has been under the care of an allergist for recurrent urticaria.  They did labs which showed a positive double-stranded DNA antibody.  She has also had intermittent proteinuria.  Her allergist placed a rheumatology referral.  She is requesting that we add a nephrology referral.   Past Medical/Surgical History: Past Medical History:  Diagnosis Date   Anxiety 12/09/2013   Arthritis    Cataract    Diabetes mellitus    Diverticulitis    patient states has had three attacks    Diverticulosis    DJD (degenerative joint disease) 12/14/2014   Glaucoma    Hx of adenomatous colonic polyps    Hyperlipidemia    Hypertension    Intraductal papilloma    bx nef x 2 in the 70s-90s   Saccular aneurysm    SCC (squamous cell carcinoma) 03/2011   sees dermatology    Past Surgical History:  Procedure Laterality Date   BREAST BIOPSY  '91, '95   INTRADUCTAL PAPILLOMA   BREAST LUMPECTOMY     CATARACT EXTRACTION Bilateral 09/2014   CLOSED REDUCTION ULNAR SHAFT Right 10/06/2017   Procedure: CLOSED REDUCTION DISLOCATED RIGHT SHOULDER;  Surgeon: Kathryne Hitch, MD;  Location: [facility] OR;  Service: Orthopedics;  Laterality: Right;   COLONOSCOPY      Social History:  reports that she has never smoked. She has never used smokeless tobacco. She reports that she does not drink alcohol and does not use drugs.  Allergies: Allergies  Allergen Reactions   Ciprofloxacin Nausea And Vomiting    Family History:  Family History  Adopted: Yes  Problem Relation Age of Onset   Heart disease Mother 18   Heart disease Father 25   Skin cancer Sister    Colon cancer Maternal Grandfather        COLON,, FAMILY HX 7 MEMBERS   Breast cancer Maternal Aunt    Stroke Maternal Aunt    Breast cancer Maternal Aunt    Colon  cancer Paternal Uncle        7 uncles   Rectal cancer Neg Hx    Stomach cancer Neg Hx      Current Outpatient Medications:    ALPRAZolam (XANAX) 0.25 MG tablet, TAKE 1 TABLET(0.25 MG) BY MOUTH DAILY AS NEEDED FOR ANXIETY, Disp: 30 tablet, Rfl: 1   carvedilol (COREG) 25 MG tablet, TAKE 1 TABLET(25 MG) BY MOUTH TWICE DAILY WITH A MEAL, Disp: 180 tablet, Rfl: 1   cholecalciferol (VITAMIN D) 1000 UNITS tablet, Take 1,000 Units by mouth daily., Disp: , Rfl:    co-enzyme Q-10 30 MG capsule, Take 30 mg by mouth daily., Disp: , Rfl:    dorzolamide-timolol (COSOPT) 22.3-6.8 MG/ML ophthalmic solution, Place 1 drop into both eyes 2 (two) times daily. , Disp: , Rfl:    glucose blood (ONETOUCH ULTRA) test strip, CHECK BLOOD SUGAR NO MORE THAN 2 TIMES PER DAY, Disp: 200 strip, Rfl: 12   metFORMIN (GLUCOPHAGE) 1000 MG tablet, TAKE 1 TABLET BY MOUTH WITH BREAKFAST AND LUNCH THEN TAKE 1/2 TABLET BY MOUTH AT BEDTIME, Disp: 225 tablet, Rfl: 1   OneTouch UltraSoft 2 Lancets MISC, 1 each by Other route 2 (two) times daily., Disp: 200 each, Rfl: 12   RESTASIS 0.05 % ophthalmic emulsion, Place 1 drop into both eyes  as needed., Disp: , Rfl:    Semaglutide,0.25 or 0.5MG /DOS, 2 MG/3ML SOPN, INJECT 0.5 UNDER THE SKIN ONCE A WEEK, Disp: , Rfl:    triamcinolone cream (KENALOG) 0.1 %, Apply 1 Application topically 2 (two) times daily., Disp: 30 g, Rfl: 0  Review of Systems:  Negative unless indicated in HPI.   Physical Exam: Vitals:   05/29/23 1352  BP: 110/70  Pulse: 70  Temp: 97.9 F (36.6 C)  TempSrc: Oral  SpO2: 100%  Weight: 135 lb 12.8 oz (61.6 kg)    Body mass index is 25.66 kg/m.    Impression and Plan:  Ds DNA antibody positive -     Ambulatory referral to Rheumatology -     Ambulatory referral to Nephrology  Proteinuria, unspecified type -     Ambulatory referral to Rheumatology -     Ambulatory referral to Nephrology   -Okay for nephrology referral.  It appears her rheumatology  referral was closed, I am not sure why so I will replace today.  Time spent:22 minutes reviewing chart, interviewing and examining patient and formulating plan of care.     Chaya Jan, MD [facility] at [facility]

## 2023-06-05 ENCOUNTER — Telehealth: Payer: Self-pay | Admitting: Allergy

## 2023-06-05 DIAGNOSIS — R778 Other specified abnormalities of plasma proteins: Secondary | ICD-10-CM

## 2023-06-05 DIAGNOSIS — L509 Urticaria, unspecified: Secondary | ICD-10-CM

## 2023-06-05 NOTE — Telephone Encounter (Signed)
 Tried to call.  Sent [employer]. Will mail labs to home.

## 2023-06-15 DIAGNOSIS — L299 Pruritus, unspecified: Secondary | ICD-10-CM | POA: Diagnosis not present

## 2023-06-15 DIAGNOSIS — L509 Urticaria, unspecified: Secondary | ICD-10-CM | POA: Diagnosis not present

## 2023-06-15 DIAGNOSIS — R21 Rash and other nonspecific skin eruption: Secondary | ICD-10-CM | POA: Diagnosis not present

## 2023-06-15 DIAGNOSIS — L309 Dermatitis, unspecified: Secondary | ICD-10-CM | POA: Diagnosis not present

## 2023-06-15 DIAGNOSIS — R778 Other specified abnormalities of plasma proteins: Secondary | ICD-10-CM | POA: Diagnosis not present

## 2023-06-27 LAB — PROTEIN ELECTROPHORESIS, SERUM
A/G Ratio: 1.1 (ref 0.7–1.7)
Albumin ELP: 4 g/dL (ref 2.9–4.4)
Alpha 1: 0.1 g/dL (ref 0.0–0.4)
Alpha 2: 0.8 g/dL (ref 0.4–1.0)
Beta: 1.2 g/dL (ref 0.7–1.3)
Gamma Globulin: 1.4 g/dL (ref 0.4–1.8)
Globulin, Total: 3.5 g/dL (ref 2.2–3.9)
M-Spike, %: 0.6 g/dL — ABNORMAL HIGH
Total Protein: 7.5 g/dL (ref 6.0–8.5)

## 2023-06-27 LAB — THYROID CASCADE PROFILE: TSH: 0.7 u[IU]/mL (ref 0.450–4.500)

## 2023-06-28 ENCOUNTER — Ambulatory Visit (INDEPENDENT_AMBULATORY_CARE_PROVIDER_SITE_OTHER): Admitting: Allergy

## 2023-06-28 ENCOUNTER — Telehealth: Payer: Self-pay | Admitting: Allergy

## 2023-06-28 ENCOUNTER — Encounter: Payer: Self-pay | Admitting: Allergy

## 2023-06-28 VITALS — BP 120/78 | HR 59 | Temp 97.6°F | Resp 16

## 2023-06-28 DIAGNOSIS — L509 Urticaria, unspecified: Secondary | ICD-10-CM | POA: Diagnosis not present

## 2023-06-28 DIAGNOSIS — R778 Other specified abnormalities of plasma proteins: Secondary | ICD-10-CM | POA: Diagnosis not present

## 2023-06-28 NOTE — Progress Notes (Signed)
 Discussed labs during 3/13 OV.

## 2023-06-28 NOTE — Progress Notes (Signed)
 Follow Up Note  RE: Patricia Moore MRN: [id number] DOB: 07/01/1933 Date of Office Visit: 06/28/2023  Referring provider: Philip Aspen, Almira Bar* Primary care provider: Philip Aspen, Limmie Patricia, MD  Chief Complaint: Urticaria (Hives on leg)  History of Present Illness: I had the pleasure of seeing Patricia Moore for a follow up visit at the [facility] on 06/28/2023. She is a 89 y.o. female, who is being followed for hives. Her previous allergy office visit was on 04/24/2023 with Dr. Selena Batten. Today is a new complaint visit of hives .  Discussed the use of AI scribe software for clinical note transcription with the patient, who gave verbal consent to proceed.    She experiences hives that began on the morning of the visit, documented with a photograph. The hives are localized and itchy, though not severely. She did not take any medication for the hives at home and no longer takes daily medication for this condition. A similar episode occurred in January.  She has a history of a monoclonal protein spike in her blood work, initially slightly elevated and now increased more. She has not yet consulted a rheumatologist due to a long wait for an appointment, which is scheduled for July. She previously consulted a hematologist about five or six years ago but does not recall the specific reason for the visit.     Assessment and Plan: Patricia Moore is a 89 y.o. female with: Urticaria Past history - recurrent, pruritic, erythematous rash of unknown etiology, localized to the upper torso, with episodes lasting approximately 4-5 days. The rash has been occurring for over a year, with increased frequency in the past month. No known triggers or alleviating factors. No history of eczema, asthma, allergies, or significant illness. No changes in medications, diet, or environment. Reviewed images - looks like urticaria. 2024 bloodwork unremarkable except slightly elevated dsDNA ab, and M-spike. Interim  history - repeat SPEP showed M-spike again. 1 episode of hive in January and 1 today which is already resolving. Not taking any antihistamines. Keep track of episodes and take pictures of the rash.  If you start breaking out again:  Start zyrtec (cetirizine) 10mg  OR allegra (fexofenadine) 180mg  once a day.  Start Pepcid (famotidine) 20mg  twice a day.  Avoid the following potential triggers: alcohol, tight clothing, NSAIDs, hot showers and getting overheated. Continue proper skin care.    Abnormal SPEP Will refer to hematology.  Keep appointment with rheumatologist Dr. Dimple Casey   Return if symptoms worsen or fail to improve.  No orders of the defined types were placed in this encounter.  Lab Orders  No laboratory test(s) ordered today    Diagnostics: None.   Medication List:  Current Outpatient Medications  Medication Sig Dispense Refill   ALPRAZolam (XANAX) 0.25 MG tablet TAKE 1 TABLET(0.25 MG) BY MOUTH DAILY AS NEEDED FOR ANXIETY 30 tablet 1   carvedilol (COREG) 25 MG tablet TAKE 1 TABLET(25 MG) BY MOUTH TWICE DAILY WITH A MEAL 180 tablet 1   cholecalciferol (VITAMIN D) 1000 UNITS tablet Take 1,000 Units by mouth daily.     co-enzyme Q-10 30 MG capsule Take 30 mg by mouth daily.     dorzolamide-timolol (COSOPT) 22.3-6.8 MG/ML ophthalmic solution Place 1 drop into both eyes 2 (two) times daily.      glucose blood (ONETOUCH ULTRA) test strip CHECK BLOOD SUGAR NO MORE THAN 2 TIMES PER DAY 200 strip 12   metFORMIN (GLUCOPHAGE) 1000 MG tablet TAKE 1 TABLET BY MOUTH WITH  BREAKFAST AND LUNCH THEN TAKE 1/2 TABLET BY MOUTH AT BEDTIME 225 tablet 1   NYSTATIN powder Apply 1 Application topically 3 (three) times daily.     OneTouch UltraSoft 2 Lancets MISC 1 each by Other route 2 (two) times daily. 200 each 12   RESTASIS 0.05 % ophthalmic emulsion Place 1 drop into both eyes as needed.     Semaglutide,0.25 or 0.5MG /DOS, 2 MG/3ML SOPN INJECT 0.5 UNDER THE SKIN ONCE A WEEK     No current  facility-administered medications for this visit.   Allergies: Allergies  Allergen Reactions   Ciprofloxacin Nausea And Vomiting   I reviewed her past medical history, social history, family history, and environmental history and no significant changes have been reported from her previous visit.  Review of Systems  Constitutional:  Negative for appetite change, chills, fever and unexpected weight change.  HENT:  Negative for congestion and rhinorrhea.   Eyes:  Negative for itching.  Respiratory:  Negative for cough, chest tightness, shortness of breath and wheezing.   Cardiovascular:  Negative for chest pain.  Gastrointestinal:  Negative for abdominal pain.  Genitourinary:  Negative for difficulty urinating.  Skin:  Positive for rash.  Neurological:  Negative for headaches.    Objective: BP 120/78 (BP Location: Right Arm, Patient Position: Sitting, Cuff Size: Normal)   Pulse (!) 59   Temp 97.6 F (36.4 C) (Temporal)   Resp 16   LMP 04/17/1988   SpO2 97%  There is no height or weight on file to calculate BMI. Physical Exam Vitals and nursing note reviewed.  Constitutional:      Appearance: Normal appearance. She is well-developed.  HENT:     Head: Normocephalic and atraumatic.     Right Ear: Tympanic membrane and external ear normal.     Left Ear: Tympanic membrane and external ear normal.     Nose: Nose normal.     Mouth/Throat:     Mouth: Mucous membranes are moist.     Pharynx: Oropharynx is clear.  Eyes:     Conjunctiva/sclera: Conjunctivae normal.  Cardiovascular:     Rate and Rhythm: Normal rate and regular rhythm.     Heart sounds: Normal heart sounds. No murmur heard.    No friction rub. No gallop.  Pulmonary:     Effort: Pulmonary effort is normal.     Breath sounds: Normal breath sounds. No wheezing, rhonchi or rales.  Musculoskeletal:     Cervical back: Neck supple.  Skin:    General: Skin is warm.     Findings: Rash present.     Comments: Faint  erythematous patch on left upper medial thigh  Neurological:     Mental Status: She is alert and oriented to person, place, and time.  Psychiatric:        Behavior: Behavior normal.    Previous notes and tests were reviewed. The plan was reviewed with the patient/family, and all questions/concerned were addressed.  It was my pleasure to see Patricia Moore today and participate in her care. Please feel free to contact me with any questions or concerns.  Sincerely,  Wyline Mood, DO Allergy & Immunology  [facility] office: [phone] [facility] office: [phone]

## 2023-06-28 NOTE — Patient Instructions (Addendum)
 Refer to hematology/oncology for abnormal SPEP - positive M spike.   Hives: Keep track of episodes and take pictures of the rash.  If you start breaking out again:  Start zyrtec (cetirizine) 10mg  OR allegra (fexofenadine) 180mg  once a day.  Start Pepcid (famotidine) 20mg  twice a day.   Avoid the following potential triggers: alcohol, tight clothing, NSAIDs, hot showers and getting overheated. Continue proper skin care.   Return if symptoms worsen or fail to improve.   Keep appointment with rheumatologist Dr. Dimple Casey on 10/18/2023 at 1PM.

## 2023-06-28 NOTE — Telephone Encounter (Signed)
 Please place referral to heme/onc for elevated M spike on SPEP.  Thank you.

## 2023-07-04 ENCOUNTER — Ambulatory Visit: Payer: Medicare Other | Admitting: Orthopedic Surgery

## 2023-07-04 DIAGNOSIS — M542 Cervicalgia: Secondary | ICD-10-CM | POA: Diagnosis not present

## 2023-07-04 MED ORDER — TRAMADOL HCL 50 MG PO TABS: 50.0000 mg | ORAL_TABLET | Freq: Four times a day (QID) | ORAL | 0 refills | Status: AC | PRN
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
## 2023-07-16 DIAGNOSIS — H401131 Primary open-angle glaucoma, bilateral, mild stage: Secondary | ICD-10-CM | POA: Diagnosis not present

## 2023-07-24 ENCOUNTER — Other Ambulatory Visit: Payer: Self-pay | Admitting: Internal Medicine

## 2023-07-24 DIAGNOSIS — E119 Type 2 diabetes mellitus without complications: Secondary | ICD-10-CM

## 2023-07-25 ENCOUNTER — Other Ambulatory Visit

## 2023-07-26 ENCOUNTER — Ambulatory Visit
Admission: RE | Admit: 2023-07-26 | Discharge: 2023-07-26 | Disposition: A | Discharge status: Home / Self Care | Source: Ambulatory Visit | Attending: Orthopedic Surgery

## 2023-07-26 DIAGNOSIS — M4802 Spinal stenosis, cervical region: Secondary | ICD-10-CM | POA: Diagnosis not present

## 2023-07-26 DIAGNOSIS — M542 Cervicalgia: Secondary | ICD-10-CM

## 2023-07-26 DIAGNOSIS — M452 Ankylosing spondylitis of cervical region: Secondary | ICD-10-CM | POA: Diagnosis not present

## 2023-08-02 NOTE — Progress Notes (Signed)
 Referral received for patient. Have scheduler call to make appointment.

## 2023-08-08 NOTE — Telephone Encounter (Signed)
 Patricia Moore has been scheduled with [facility] for 10/18/2023 at 1:00 pm with Dr. Rodell Citrin.

## 2023-08-13 ENCOUNTER — Telehealth: Payer: Self-pay

## 2023-08-13 ENCOUNTER — Telehealth: Payer: Self-pay | Admitting: *Deleted

## 2023-08-13 NOTE — Telephone Encounter (Signed)
 Copied from CRM [id number]. Topic: General - Other >> Aug 13, 2023 10:40 AM Juleen Oakland F wrote: Reason for CRM: Patient returning Kayleen Party phone call regarding the samples of Ozympic, says she will come in today or tomorrow to pick it up

## 2023-08-13 NOTE — Telephone Encounter (Signed)
 noted

## 2023-08-13 NOTE — Telephone Encounter (Signed)
 Samples of :  Ozempic  0.25/0.5 ml 4 pens.  Left detailed message on machine for patient.

## 2023-08-15 ENCOUNTER — Telehealth: Payer: Self-pay | Admitting: Internal Medicine

## 2023-08-15 ENCOUNTER — Other Ambulatory Visit: Payer: Self-pay | Admitting: Internal Medicine

## 2023-08-15 DIAGNOSIS — F419 Anxiety disorder, unspecified: Secondary | ICD-10-CM

## 2023-08-15 NOTE — Telephone Encounter (Signed)
 Copied from CRM [id number]. Topic: General - Call Back - No Documentation >> Aug 15, 2023  1:52 PM Patricia Moore wrote: Reason for CRM: Patient returning missed phone call, unable to locate any notes at this time. Patient request callback if needed

## 2023-08-15 NOTE — Telephone Encounter (Signed)
 noted

## 2023-08-20 ENCOUNTER — Ambulatory Visit: Admitting: Orthopedic Surgery

## 2023-08-20 DIAGNOSIS — M542 Cervicalgia: Secondary | ICD-10-CM | POA: Diagnosis not present

## 2023-08-20 MED ORDER — TRAMADOL HCL 50 MG PO TABS: 50.0000 mg | ORAL_TABLET | Freq: Four times a day (QID) | ORAL | 0 refills | Status: AC | PRN

## 2023-08-20 NOTE — Progress Notes (Signed)
 Orthopedic Spine Surgery Office Note   Assessment: Patient is a 89 y.o. female with chronic neck pain after a C1 and C2 fracture     Plan: -New prescription for tramadol  provided to her today -Told her that her options at this point are injections, surgery, continued pain management with tramadol . She wanted to continue with tramadol  -Continue with soft collar for particularly painful days -Patient should return to office in 3 months, x-rays at next visit: none     Patient expressed understanding of the plan and all questions were answered to the patient's satisfaction.    ___________________________________________________________________________     History:   Patient is a 89 y.o. female who presents today for follow up of C1 and C2 fractures. Patient is still having neck pain. No radiating arm pain. Pain is worst with rotation. She has noticed any new symptoms since she was last seen.    Treatments tried: collar use, activity modification, oxycodone , tylenol , NSAIDs, tramadol      Physical Exam:   General: no acute distress, appears stated age Neurologic: alert, answering questions appropriately, following commands Respiratory: unlabored breathing on room air, symmetric chest rise Psychiatric: appropriate affect, normal cadence to speech     MSK (spine):   -Strength exam                                                   Left                  Right Grip strength                5/5                  5/5 Interosseus                  5/5                  5/5 Wrist extension            5/5                  5/5 Wrist flexion                 5/5                  5/5 Elbow flexion                5/5                  5/5 Deltoid                          5/5                  4/5     -Sensory exam                         Sensation intact to light touch in C5-T1 nerve distributions of bilateral upper extremities     Imaging: XRs of the cervical spine from 06/15/2022 were  previously independently reviewed and interpreted, showing ~10mm lateral mass overhang on the left side at C1/2. Displaced C2 lateral mass with depression. Decreased joint space at C1/2 on the left. No lateral mass overhang on the contralateral  side. Degenerative changes throughout the cervical spine.    CT of the cervical spine from 07/26/2023 were independently reviewed and interpreted, showing healed C1 and C2 fractures. There is arthrosis seen at the C2/3 and C3/4 facet joints. Appears to be autofusion across the right C2/3 joint. No new fractures seen.     Patient name: Patricia Moore Patient MRN: [id number] Date of visit: 08/20/23

## 2023-08-23 ENCOUNTER — Encounter: Admitting: Internal Medicine

## 2023-08-23 ENCOUNTER — Encounter: Payer: Self-pay | Admitting: Internal Medicine

## 2023-08-23 NOTE — Progress Notes (Signed)
 This encounter was created in error - please disregard.

## 2023-09-04 ENCOUNTER — Inpatient Hospital Stay: Attending: Hematology | Admitting: Hematology

## 2023-09-04 ENCOUNTER — Inpatient Hospital Stay

## 2023-09-04 ENCOUNTER — Telehealth: Payer: Self-pay | Admitting: Hematology

## 2023-09-04 VITALS — BP 146/66 | HR 73 | Temp 97.5°F | Resp 18 | Wt 136.5 lb

## 2023-09-04 DIAGNOSIS — Z7985 Long-term (current) use of injectable non-insulin antidiabetic drugs: Secondary | ICD-10-CM | POA: Insufficient documentation

## 2023-09-04 DIAGNOSIS — E785 Hyperlipidemia, unspecified: Secondary | ICD-10-CM | POA: Diagnosis not present

## 2023-09-04 DIAGNOSIS — Z803 Family history of malignant neoplasm of breast: Secondary | ICD-10-CM | POA: Insufficient documentation

## 2023-09-04 DIAGNOSIS — I10 Essential (primary) hypertension: Secondary | ICD-10-CM | POA: Diagnosis not present

## 2023-09-04 DIAGNOSIS — L501 Idiopathic urticaria: Secondary | ICD-10-CM | POA: Insufficient documentation

## 2023-09-04 DIAGNOSIS — D472 Monoclonal gammopathy: Secondary | ICD-10-CM | POA: Diagnosis not present

## 2023-09-04 DIAGNOSIS — Z7984 Long term (current) use of oral hypoglycemic drugs: Secondary | ICD-10-CM | POA: Diagnosis not present

## 2023-09-04 DIAGNOSIS — Z800 Family history of malignant neoplasm of digestive organs: Secondary | ICD-10-CM | POA: Diagnosis not present

## 2023-09-04 DIAGNOSIS — Z79899 Other long term (current) drug therapy: Secondary | ICD-10-CM | POA: Insufficient documentation

## 2023-09-04 DIAGNOSIS — M1990 Unspecified osteoarthritis, unspecified site: Secondary | ICD-10-CM | POA: Diagnosis not present

## 2023-09-04 DIAGNOSIS — Z860101 Personal history of adenomatous and serrated colon polyps: Secondary | ICD-10-CM | POA: Diagnosis not present

## 2023-09-04 DIAGNOSIS — E119 Type 2 diabetes mellitus without complications: Secondary | ICD-10-CM | POA: Diagnosis not present

## 2023-09-04 LAB — CMP (CANCER CENTER ONLY)
ALT: 21 U/L (ref 0–44)
AST: 27 U/L (ref 15–41)
Albumin: 4.8 g/dL (ref 3.5–5.0)
Alkaline Phosphatase: 64 U/L (ref 38–126)
Anion gap: 5 (ref 5–15)
BUN: 29 mg/dL — ABNORMAL HIGH (ref 8–23)
CO2: 30 mmol/L (ref 22–32)
Calcium: 10.3 mg/dL (ref 8.9–10.3)
Chloride: 102 mmol/L (ref 98–111)
Creatinine: 0.89 mg/dL (ref 0.44–1.00)
GFR, Estimated: >60 mL/min (ref >60)
Glucose, Bld: 97 mg/dL (ref 70–99)
Potassium: 4.3 mmol/L (ref 3.5–5.1)
Sodium: 137 mmol/L (ref 135–145)
Total Bilirubin: 0.5 mg/dL (ref 0.0–1.2)
Total Protein: 8.4 g/dL — ABNORMAL HIGH (ref 6.5–8.1)

## 2023-09-04 LAB — CBC WITH DIFFERENTIAL (CANCER CENTER ONLY)
Abs Immature Granulocytes: 0.03 10*3/uL (ref 0.00–0.07)
Basophils Absolute: 0.1 10*3/uL (ref 0.0–0.1)
Basophils Relative: 1 %
Eosinophils Absolute: 0.5 10*3/uL (ref 0.0–0.5)
Eosinophils Relative: 5 %
HCT: 40.5 % (ref 36.0–46.0)
Hemoglobin: 13.8 g/dL (ref 12.0–15.0)
Immature Granulocytes: 0 %
Lymphocytes Relative: 25 %
Lymphs Abs: 2.3 10*3/uL (ref 0.7–4.0)
MCH: 31.4 pg (ref 26.0–34.0)
MCHC: 34.1 g/dL (ref 30.0–36.0)
MCV: 92 fL (ref 80.0–100.0)
Monocytes Absolute: 0.8 10*3/uL (ref 0.1–1.0)
Monocytes Relative: 9 %
Neutro Abs: 5.5 10*3/uL (ref 1.7–7.7)
Neutrophils Relative %: 60 %
Platelet Count: 303 10*3/uL (ref 150–400)
RBC: 4.4 MIL/uL (ref 3.87–5.11)
RDW: 12.2 % (ref 11.5–15.5)
WBC Count: 9.2 10*3/uL (ref 4.0–10.5)
nRBC: 0 % (ref 0.0–0.2)

## 2023-09-04 LAB — LACTATE DEHYDROGENASE: LDH: 128 U/L (ref 98–192)

## 2023-09-04 LAB — VITAMIN D 25 HYDROXY (VIT D DEFICIENCY, FRACTURES): Vit D, 25-Hydroxy: 46.18 ng/mL (ref 30–100)

## 2023-09-04 NOTE — Progress Notes (Signed)
 HEMATOLOGY/ONCOLOGY CONSULTATION NOTE  Date of Service: 09/04/2023  Patient Care Team: Zilphia Hilt, Charyl Coppersmith, MD as PCP - General (Internal Medicine) Eilleen Grates, MD as PCP - Cardiology (Cardiology) Drusilla Gerlach, MD as Consulting Physician (Dermatology) Adelaide Adjutant, MD as Consulting Physician (Physical Medicine and Rehabilitation) Merriam Abbey, DO as Consulting Physician (Neurology) Percy Bracken, MD as Consulting Physician (Obstetrics and Gynecology) Tobin Forts, MD as Consulting Physician (Gastroenterology) Susie Erichsen, MD as Referring Physician (Ophthalmology) Auther Bo, RN as Nurse Navigator  CHIEF COMPLAINTS/PURPOSE OF CONSULTATION:  elevated M-spike on SPEP   HISTORY OF PRESENTING ILLNESS:  Patricia Moore is a wonderful 89 y.o. female who has been referred to us  by Eudelia Hero, DO, for evaluation and management of elevated M-spike on SPEP 0.6 g/dL.     MEDICAL HISTORY:  Past Medical History:  Diagnosis Date   Anxiety 12/09/2013   Arthritis    Cataract    Diabetes mellitus    Diverticulitis    patient states has had three attacks    Diverticulosis    DJD (degenerative joint disease) 12/14/2014   Glaucoma    Hx of adenomatous colonic polyps    Hyperlipidemia    Hypertension    Intraductal papilloma    bx nef x 2 in the 70s-90s   Saccular aneurysm    SCC (squamous cell carcinoma) 03/2011   sees dermatology    SURGICAL HISTORY: Past Surgical History:  Procedure Laterality Date   BREAST BIOPSY  '91, '95   INTRADUCTAL PAPILLOMA   BREAST LUMPECTOMY     CATARACT EXTRACTION Bilateral 09/2014   CLOSED REDUCTION ULNAR SHAFT Right 10/06/2017   Procedure: CLOSED REDUCTION DISLOCATED RIGHT SHOULDER;  Surgeon: Arnie Lao, MD;  Location: [facility] OR;  Service: Orthopedics;  Laterality: Right;   COLONOSCOPY      SOCIAL HISTORY: Social History   Socioeconomic History   Marital status: Married    Spouse name: Not on file    Number of children: 1   Years of education: Not on file   Highest education level: Not on file  Occupational History   Occupation: retired     Associate Professor: HOMEMAKER  Tobacco Use   Smoking status: Never   Smokeless tobacco: Never  Vaping Use   Vaping status: Never Used  Substance and Sexual Activity   Alcohol use: Never   Drug use: No   Sexual activity: Not Currently    Partners: Male    Birth control/protection: Post-menopausal    Comment: First IC >16y/o, <5 Partners  Other Topics Concern   Not on file  Social History Narrative   Born in [place], ,moved to the [place]  in the 60s, live in [place] then in [place]   Lives w/ husband   H.S. Buyer, retail   Son lives in [place])    Social Drivers of Health   Financial Resource Strain: Medium Risk (04/21/2022)   Overall Financial Resource Strain (CARDIA)    Difficulty of Paying Living Expenses: Somewhat hard  Food Insecurity: No Food Insecurity (12/04/2022)   Hunger Vital Sign    Worried About Running Out of Food in the Last Year: Never true    Ran Out of Food in the Last Year: Never true  Transportation Needs: Not on file (11/14/2022)  Physical Activity: Inactive (12/04/2022)   Exercise Vital Sign    Days of Exercise per Week: 0 days    Minutes of Exercise per Session: 0 min  Stress: No Stress Concern Present (12/04/2022)  [employer] of Occupational Health - Occupational Stress Questionnaire    Feeling of Stress : Only a little  Social Connections: Moderately Isolated (12/04/2022)   Social Connection and Isolation Panel [NHANES]    Frequency of Communication with Friends and Family: More than three times a week    Frequency of Social Gatherings with Friends and Family: Once a week    Attends Religious Services: Never    Database administrator or Organizations: No    Attends Banker Meetings: Never    Marital Status: Married  Catering manager Violence: Not At Risk (12/04/2022)   Humiliation, Afraid, Rape, and Kick questionnaire     Fear of Current or Ex-Partner: No    Emotionally Abused: No    Physically Abused: No    Sexually Abused: No    FAMILY HISTORY: Family History  Adopted: Yes  Problem Relation Age of Onset   Heart disease Mother 101   Heart disease Father 83   Skin cancer Sister    Colon cancer Maternal Grandfather        COLON,, FAMILY HX 7 MEMBERS   Breast cancer Maternal Aunt    Stroke Maternal Aunt    Breast cancer Maternal Aunt    Colon cancer Paternal Uncle        7 uncles   Rectal cancer Neg Hx    Stomach cancer Neg Hx     ALLERGIES:  is allergic to ciprofloxacin .  MEDICATIONS:  Current Outpatient Medications  Medication Sig Dispense Refill   ALPRAZolam  (XANAX ) 0.25 MG tablet TAKE 1 TABLET(0.25 MG) BY MOUTH DAILY AS NEEDED FOR ANXIETY 30 tablet 0   carvedilol  (COREG ) 25 MG tablet TAKE 1 TABLET(25 MG) BY MOUTH TWICE DAILY WITH A MEAL 180 tablet 1   cholecalciferol (VITAMIN D ) 1000 UNITS tablet Take 1,000 Units by mouth daily.     co-enzyme Q-10 30 MG capsule Take 30 mg by mouth daily.     dorzolamide-timolol (COSOPT) 22.3-6.8 MG/ML ophthalmic solution Place 1 drop into both eyes 2 (two) times daily.      glucose blood (ONETOUCH ULTRA) test strip CHECK BLOOD SUGAR NO MORE THAN 2 TIMES PER DAY 200 strip 12   metFORMIN  (GLUCOPHAGE ) 1000 MG tablet TAKE 1 TABLET BY MOUTH WITH BREAKFAST AND LUNCH THEN TAKE 1/2 TABLET BY MOUTH AT BEDTIME 225 tablet 1   NYSTATIN  powder Apply 1 Application topically 3 (three) times daily.     OneTouch UltraSoft 2 Lancets MISC TEST BLOOD SUGAR NO MOORE THEN TWICE DAILY 200 each 12   RESTASIS 0.05 % ophthalmic emulsion Place 1 drop into both eyes as needed.     Semaglutide ,0.25 or 0.5MG /DOS, 2 MG/3ML SOPN INJECT 0.5 UNDER THE SKIN ONCE A WEEK     No current facility-administered medications for this visit.    REVIEW OF SYSTEMS:    10 Point review of Systems was done is negative except as noted above.  PHYSICAL EXAMINATION: ECOG PERFORMANCE STATUS: {CHL  ONC ECOG GN:[id number]}  .There were no vitals filed for this visit. There were no vitals filed for this visit. .There is no height or weight on file to calculate BMI.  GENERAL:alert, in no acute distress and comfortable SKIN: no acute rashes, no significant lesions EYES: conjunctiva are pink and non-injected, sclera anicteric OROPHARYNX: MMM, no exudates, no oropharyngeal erythema or ulceration NECK: supple, no JVD LYMPH:  no palpable lymphadenopathy in the cervical, axillary or inguinal regions LUNGS: clear to auscultation b/l with normal respiratory effort HEART: regular rate &  rhythm ABDOMEN:  normoactive bowel sounds , non tender, not distended. Extremity: no pedal edema PSYCH: alert & oriented x 3 with fluent speech NEURO: no focal motor/sensory deficits  LABORATORY DATA:  I have reviewed the data as listed  .    Latest Ref Rng & Units 03/06/2023   11:20 AM 12/04/2022    1:52 PM 02/01/2022    4:56 PM  CBC  WBC 3.4 - 10.8 x10E3/uL 7.7  7.4  11.2   Hemoglobin 11.1 - 15.9 g/dL 84.6  96.2  95.2   Hematocrit 34.0 - 46.6 % 41.7  41.7  42.6   Platelets 150 - 450 x10E3/uL 305  318.0  414     .    Latest Ref Rng & Units 06/15/2023   11:24 AM 05/15/2023    2:16 PM 03/06/2023   11:20 AM  CMP  Glucose 65 - 99 mg/dL  841  324   BUN 7 - 25 mg/dL  23  23   Creatinine 4.01 - 0.95 mg/dL  0.27  2.53   Sodium 664 - 146 mmol/L  137  138   Potassium 3.5 - 5.3 mmol/L  4.5  4.7   Chloride 98 - 110 mmol/L  98  97   CO2 20 - 32 mmol/L  29  29   Calcium  8.6 - 10.4 mg/dL  40.3  47.4   Total Protein 6.0 - 8.5 g/dL 7.5  7.8  7.8   Total Bilirubin 0.2 - 1.2 mg/dL  0.4  0.4   Alkaline Phos 44 - 121 IU/L   74   AST 10 - 35 U/L  26  23   ALT 6 - 29 U/L  19  13      RADIOGRAPHIC STUDIES: I have personally reviewed the radiological images as listed and agreed with the findings in the report. No results found.  ASSESSMENT & PLAN:  89 y.o. female with :  Elevated M-spike on SPEP 0.6  g/dL  PLAN:  FOLLOW-UP: ***  The total time spent in the appointment was *** minutes* .  All of the patient's questions were answered with apparent satisfaction. The patient knows to call the clinic with any problems, questions or concerns.   Jacquelyn Matt MD MS AAHIVMS [facility] Hematology/Oncology Physician [facility]  .*Total Encounter Time as defined by the Centers for Medicare and Medicaid Services includes, in addition to the face-to-face time of a patient visit (documented in the note above) non-face-to-face time: obtaining and reviewing outside history, ordering and reviewing medications, tests or procedures, care coordination (communications with other health care professionals or caregivers) and documentation in the medical record.    I,Mitra Faeizi,acting as a Neurosurgeon for Jacquelyn Matt, MD.,have documented all relevant documentation on the behalf of Jacquelyn Matt, MD,as directed by  Jacquelyn Matt, MD while in the presence of Jacquelyn Matt, MD.  ***

## 2023-09-04 NOTE — Telephone Encounter (Signed)
 Left a voicemail with scheduled appointment details. Will be mailed a reminder.

## 2023-09-05 ENCOUNTER — Encounter: Payer: Self-pay | Admitting: Internal Medicine

## 2023-09-05 ENCOUNTER — Ambulatory Visit: Admitting: Internal Medicine

## 2023-09-05 VITALS — BP 124/64 | HR 64 | Ht 61.0 in | Wt 137.0 lb

## 2023-09-05 DIAGNOSIS — Z800 Family history of malignant neoplasm of digestive organs: Secondary | ICD-10-CM

## 2023-09-05 DIAGNOSIS — Z8719 Personal history of other diseases of the digestive system: Secondary | ICD-10-CM

## 2023-09-05 DIAGNOSIS — R195 Other fecal abnormalities: Secondary | ICD-10-CM | POA: Diagnosis not present

## 2023-09-05 DIAGNOSIS — L290 Pruritus ani: Secondary | ICD-10-CM

## 2023-09-05 LAB — PTH, INTACT AND CALCIUM
Calcium, Total (PTH): 10.7 mg/dL — ABNORMAL HIGH (ref 8.7–10.3)
PTH: 19 pg/mL (ref 15–65)

## 2023-09-05 LAB — BETA 2 MICROGLOBULIN, SERUM: Beta-2 Microglobulin: 1.7 mg/L (ref 0.6–2.4)

## 2023-09-05 NOTE — Patient Instructions (Addendum)
 Please follow up as needed   _______________________________________________________  If your blood pressure at your visit was 140/90 or greater, please contact your primary care physician to follow up on this.  _______________________________________________________  If you are age 88 or older, your body mass index should be between 23-30. Your Body mass index is 25.89 kg/m. If this is out of the aforementioned range listed, please consider follow up with your Primary Care Provider.  If you are age 75 or younger, your body mass index should be between 19-25. Your Body mass index is 25.89 kg/m. If this is out of the aformentioned range listed, please consider follow up with your Primary Care Provider.   ________________________________________________________  The [facility] providers would like to encourage you to use MYCHART to communicate with providers for non-urgent requests or questions.  Due to long hold times on the telephone, sending your provider a message by [facility] may be a faster and more efficient way to get a response.  Please allow 48 business hours for a response.  Please remember that this is for non-urgent requests.  _______________________________________________________

## 2023-09-05 NOTE — Progress Notes (Signed)
 HISTORY OF PRESENT ILLNESS:  Patricia Moore is a delightful 89 y.o. female, [place] native, with past medical history listed below who has been followed in this office for history of recurrent diverticulitis as well as a family history of colon cancer for which she has undergone multiple prior colonoscopies.  She was last seen in this office November 02, 2021 with an acute diverticulitis flare which was successfully treated with antibiotics.  She presents today with a chief complaint of pruritus ani that occurred about a month ago.  She self treated with baking soda.  It resolved.  No recurrence.  She also reports having had a green-colored stool on 1 occasion.  This was all new, and she thought it best to have a checkup.  GI review of systems is otherwise remarkable for decreased appetite, weight loss.  REVIEW OF SYSTEMS:  All non-GI ROS negative except for arthritis, back pain, visual change, hearing problems, heart murmur, heart rhythm change, itching, sleeping problems, ankle swelling, and excessive urination  Past Medical History:  Diagnosis Date   Anxiety 12/09/2013   Arthritis    Cataract    Diabetes mellitus    Diverticulitis    patient states has had three attacks    Diverticulosis    DJD (degenerative joint disease) 12/14/2014   Glaucoma    Hx of adenomatous colonic polyps    Hyperlipidemia    Hypertension    Intraductal papilloma    bx nef x 2 in the 70s-90s   Saccular aneurysm    SCC (squamous cell carcinoma) 03/2011   sees dermatology    Past Surgical History:  Procedure Laterality Date   BREAST BIOPSY  '91, '95   INTRADUCTAL PAPILLOMA   BREAST LUMPECTOMY     CATARACT EXTRACTION Bilateral 09/2014   CLOSED REDUCTION ULNAR SHAFT Right 10/06/2017   Procedure: CLOSED REDUCTION DISLOCATED RIGHT SHOULDER;  Surgeon: Arnie Lao, MD;  Location: [facility] OR;  Service: Orthopedics;  Laterality: Right;   COLONOSCOPY      Social History Patricia Moore  reports that she has  never smoked. She has never used smokeless tobacco. She reports that she does not drink alcohol and does not use drugs.  family history includes Breast cancer in her maternal aunt and maternal aunt; Colon cancer in her maternal grandfather and paternal uncle; Heart disease (age of onset: 46) in her mother; Heart disease (age of onset: 17) in her father; Skin cancer in her sister; Stroke in her maternal aunt. She was adopted.  Allergies  Allergen Reactions   Ciprofloxacin  Nausea And Vomiting       PHYSICAL EXAMINATION: Vital signs: BP 124/64   Pulse 64   Ht 5\' 1"  (1.549 m)   Wt 137 lb (62.1 kg)   LMP 04/17/1988   BMI 25.89 kg/m   Constitutional: generally well-appearing, no acute distress Psychiatric: alert and oriented x3, cooperative Eyes: extraocular movements intact, anicteric, conjunctiva pink Mouth: oral pharynx moist, no lesions Neck: supple no lymphadenopathy Cardiovascular: heart regular rate and rhythm, no murmur Lungs: clear to auscultation bilaterally Abdomen: soft, nontender, nondistended, no obvious ascites, no peritoneal signs, normal bowel sounds, no organomegaly Rectal: Omitted Extremities: no clubbing, cyanosis, or lower extremity edema bilaterally Skin: no lesions on visible extremities Neuro: No focal deficits.  Cranial nerves intact  ASSESSMENT:  1.  Transient perianal itching.  Resolved 2.  Transient green-colored stool.  Nonpathologic 3.  History of diverticulitis 4.  Family history of colon cancer 5.  Last complete colonoscopy December 2020 6.  Medical problems   PLAN:  1.  High-fiber diet 2.  Reassurance 3.  Follow-up as needed A total time of 30 minutes was spent preparing to see the patient, obtaining comprehensive history, performing medically appropriate physical exam, counseling and educating the patient regarding the above listed issues, and documenting clinical information in the health record

## 2023-09-07 LAB — MULTIPLE MYELOMA PANEL, SERUM
Albumin SerPl Elph-Mcnc: 3.9 g/dL (ref 2.9–4.4)
Albumin/Glob SerPl: 1 (ref 0.7–1.7)
Alpha 1: 0.1 g/dL (ref 0.0–0.4)
Alpha2 Glob SerPl Elph-Mcnc: 0.9 g/dL (ref 0.4–1.0)
B-Globulin SerPl Elph-Mcnc: 1.4 g/dL — ABNORMAL HIGH (ref 0.7–1.3)
Gamma Glob SerPl Elph-Mcnc: 1.5 g/dL (ref 0.4–1.8)
Globulin, Total: 4 g/dL — ABNORMAL HIGH (ref 2.2–3.9)
IgA: 413 mg/dL (ref 64–422)
IgG (Immunoglobin G), Serum: 1561 mg/dL (ref 586–1602)
IgM (Immunoglobulin M), Srm: 56 mg/dL (ref 26–217)
M Protein SerPl Elph-Mcnc: 0.4 g/dL — ABNORMAL HIGH
Total Protein ELP: 7.9 g/dL (ref 6.0–8.5)

## 2023-09-09 LAB — PTH-RELATED PEPTIDE: PTH-related peptide: <2 pmol/L

## 2023-09-10 DIAGNOSIS — L501 Idiopathic urticaria: Secondary | ICD-10-CM | POA: Diagnosis not present

## 2023-09-10 DIAGNOSIS — Z800 Family history of malignant neoplasm of digestive organs: Secondary | ICD-10-CM | POA: Diagnosis not present

## 2023-09-10 DIAGNOSIS — Z79899 Other long term (current) drug therapy: Secondary | ICD-10-CM | POA: Diagnosis not present

## 2023-09-10 DIAGNOSIS — Z803 Family history of malignant neoplasm of breast: Secondary | ICD-10-CM | POA: Diagnosis not present

## 2023-09-10 DIAGNOSIS — D472 Monoclonal gammopathy: Secondary | ICD-10-CM | POA: Diagnosis not present

## 2023-09-10 DIAGNOSIS — M1990 Unspecified osteoarthritis, unspecified site: Secondary | ICD-10-CM | POA: Diagnosis not present

## 2023-09-10 DIAGNOSIS — Z7985 Long-term (current) use of injectable non-insulin antidiabetic drugs: Secondary | ICD-10-CM | POA: Diagnosis not present

## 2023-09-10 DIAGNOSIS — Z860101 Personal history of adenomatous and serrated colon polyps: Secondary | ICD-10-CM | POA: Diagnosis not present

## 2023-09-10 DIAGNOSIS — E785 Hyperlipidemia, unspecified: Secondary | ICD-10-CM | POA: Diagnosis not present

## 2023-09-10 DIAGNOSIS — E119 Type 2 diabetes mellitus without complications: Secondary | ICD-10-CM | POA: Diagnosis not present

## 2023-09-10 DIAGNOSIS — I10 Essential (primary) hypertension: Secondary | ICD-10-CM | POA: Diagnosis not present

## 2023-09-10 DIAGNOSIS — Z7984 Long term (current) use of oral hypoglycemic drugs: Secondary | ICD-10-CM | POA: Diagnosis not present

## 2023-09-11 LAB — KAPPA/LAMBDA LIGHT CHAINS
Kappa free light chain: 28.6 mg/L — ABNORMAL HIGH (ref 3.3–19.4)
Kappa, lambda light chain ratio: 1.35 (ref 0.26–1.65)
Lambda free light chains: 21.2 mg/L (ref 5.7–26.3)

## 2023-09-13 ENCOUNTER — Ambulatory Visit (HOSPITAL_COMMUNITY)
Admission: RE | Admit: 2023-09-13 | Discharge: 2023-09-13 | Disposition: A | Discharge status: Home / Self Care | Source: Ambulatory Visit | Attending: Hematology | Admitting: Hematology

## 2023-09-13 DIAGNOSIS — M47812 Spondylosis without myelopathy or radiculopathy, cervical region: Secondary | ICD-10-CM | POA: Diagnosis not present

## 2023-09-13 DIAGNOSIS — D472 Monoclonal gammopathy: Secondary | ICD-10-CM

## 2023-09-13 LAB — UPEP/UIFE/LIGHT CHAINS/TP, 24-HR UR
% BETA, Urine: 0 %
ALPHA 1 URINE: 0 %
Albumin, U: 100 %
Alpha 2, Urine: 0 %
Free Kappa Lt Chains,Ur: 3.88 mg/L (ref 1.17–86.46)
Free Kappa/Lambda Ratio: >5.62 (ref 1.83–14.26)
Free Lambda Lt Chains,Ur: <0.69 mg/L (ref 0.27–15.21)
GAMMA GLOBULIN URINE: 0 %
Total Protein, Urine-Ur/day: 195 mg/(24.h) — ABNORMAL HIGH (ref 30–150)
Total Protein, Urine: 6.5 mg/dL
Total Volume: 3000

## 2023-09-21 ENCOUNTER — Telehealth: Admitting: Hematology

## 2023-09-25 ENCOUNTER — Inpatient Hospital Stay: Attending: Hematology | Admitting: Hematology

## 2023-09-25 VITALS — BP 152/59 | HR 69 | Temp 97.9°F | Resp 18 | Wt 137.8 lb

## 2023-09-25 DIAGNOSIS — I10 Essential (primary) hypertension: Secondary | ICD-10-CM | POA: Diagnosis not present

## 2023-09-25 DIAGNOSIS — Z800 Family history of malignant neoplasm of digestive organs: Secondary | ICD-10-CM | POA: Insufficient documentation

## 2023-09-25 DIAGNOSIS — Z7985 Long-term (current) use of injectable non-insulin antidiabetic drugs: Secondary | ICD-10-CM | POA: Insufficient documentation

## 2023-09-25 DIAGNOSIS — M1990 Unspecified osteoarthritis, unspecified site: Secondary | ICD-10-CM | POA: Diagnosis not present

## 2023-09-25 DIAGNOSIS — D472 Monoclonal gammopathy: Secondary | ICD-10-CM | POA: Diagnosis not present

## 2023-09-25 DIAGNOSIS — Z803 Family history of malignant neoplasm of breast: Secondary | ICD-10-CM | POA: Insufficient documentation

## 2023-09-25 DIAGNOSIS — Z860101 Personal history of adenomatous and serrated colon polyps: Secondary | ICD-10-CM | POA: Insufficient documentation

## 2023-09-25 DIAGNOSIS — R768 Other specified abnormal immunological findings in serum: Secondary | ICD-10-CM | POA: Insufficient documentation

## 2023-09-25 DIAGNOSIS — M542 Cervicalgia: Secondary | ICD-10-CM | POA: Diagnosis not present

## 2023-09-25 DIAGNOSIS — Z7984 Long term (current) use of oral hypoglycemic drugs: Secondary | ICD-10-CM | POA: Insufficient documentation

## 2023-09-25 DIAGNOSIS — E785 Hyperlipidemia, unspecified: Secondary | ICD-10-CM | POA: Diagnosis not present

## 2023-09-25 DIAGNOSIS — E119 Type 2 diabetes mellitus without complications: Secondary | ICD-10-CM | POA: Diagnosis not present

## 2023-09-25 DIAGNOSIS — Z79899 Other long term (current) drug therapy: Secondary | ICD-10-CM | POA: Diagnosis not present

## 2023-09-25 NOTE — Progress Notes (Signed)
 HEMATOLOGY/ONCOLOGY CLINIC NOTE  Date of Service: 09/30/2023  Patient Care Team: Zilphia Hilt, Charyl Coppersmith, MD as PCP - General (Internal Medicine) Eilleen Grates, MD as PCP - Cardiology (Cardiology) Drusilla Gerlach, MD as Consulting Physician (Dermatology) Adelaide Adjutant, MD as Consulting Physician (Physical Medicine and Rehabilitation) Merriam Abbey, DO as Consulting Physician (Neurology) Percy Bracken, MD as Consulting Physician (Obstetrics and Gynecology) Tobin Forts, MD as Consulting Physician (Gastroenterology) Susie Erichsen, MD as Referring Physician (Ophthalmology) Auther Bo, RN as Nurse Navigator  CHIEF COMPLAINTS/PURPOSE OF CONSULTATION:  elevated M-spike on SPEP   HISTORY OF PRESENTING ILLNESS:   Patricia Moore is a wonderful 89 y.o. female who has been referred to us  by Eudelia Hero, DO, for evaluation and management of elevated M-spike on SPEP 0.6 g/dL.   Patient has a history of hypertension, dyslipidemia, diabetes, degenerative joint disease who has been following with Dr. Burdette Carolin for recurrent urticaria of unknown etiology.  As a part of her workup for idiopathic chronic urticaria she had some labs that have previously showed an slightly elevated double-stranded DNA antibody for which she has been referred to rheumatology Dr. Rodell Citrin.  She was also noted to have a mild IgG lambda monoclonal protein spike of 0.6 g/dL and therefore was referred to us  for further evaluation of her monoclonal paraproteinemia.  She also had a tryptase level done in November 2024 which was within normal limits at 3.2. On labs in November 2024 patient had no anemia with a hemoglobin of 13.6 WBC counts and platelets, CMP did show some borderline intermittent hypercalcemia with a calcium  level of 10.7 mean of 4.7 however the corrected calcium  levels was within normal limits at 10.1.  Patient was started on Zyrtec 10 mg p.o. daily and Pepcid  20 mg p.o. twice daily.  She was recommended to  avoid potential triggers including alcohol tight clothing NSAIDs hot showers or getting overheated.  Patient notes no acute new focal symptoms.  No localized new bone pains.  No fevers no chills no night sweats.  No specific red swollen painful joints.  Other active fixed skin rashes at this time. No weight loss. No chest pain or shortness of breath.  No change in bowel habits. Patient notes that other than the hives she feels well overall. Does not note any specific association animal exposures or food exposures and denies any new use of cosmetics.    INTERVAL HISTORY:  Patricia Moore is a 89 y.o. female here for continued evaluation and management of elevated M-spike on SPEP.   She was initially seen by me on 09/04/2023 and reported hives.   Her bone survey on 09/13/2023 was negative.   Patient is accompanied by her husband during today's visit.   She was noted to have had a fall in October 2023 causing neck injury and pain and she complains that she continues to have neck issues. She also reports having facial injury laceration. She notes that her right eye ball is slightly more behind and lower. Patient notes that she occasionally needs to pull her right eyebrow up due to difficulty seeing.   She denies any abdominal pain.   She continues to follow-up with her orthopedics doctor.   Patient notes some connection issues with her phone and would prefer to have in-person visits.   MEDICAL HISTORY:  Past Medical History:  Diagnosis Date   Anxiety 12/09/2013   Arthritis    Cataract    Diabetes mellitus    Diverticulitis  patient states has had three attacks    Diverticulosis    DJD (degenerative joint disease) 12/14/2014   Glaucoma    Hx of adenomatous colonic polyps    Hyperlipidemia    Hypertension    Intraductal papilloma    bx nef x 2 in the 70s-90s   Saccular aneurysm    SCC (squamous cell carcinoma) 03/2011   sees dermatology    SURGICAL HISTORY: Past Surgical  History:  Procedure Laterality Date   BREAST BIOPSY  '91, '95   INTRADUCTAL PAPILLOMA   BREAST LUMPECTOMY     CATARACT EXTRACTION Bilateral 09/2014   CLOSED REDUCTION ULNAR SHAFT Right 10/06/2017   Procedure: CLOSED REDUCTION DISLOCATED RIGHT SHOULDER;  Surgeon: Arnie Lao, MD;  Location: [facility] OR;  Service: Orthopedics;  Laterality: Right;   COLONOSCOPY      SOCIAL HISTORY: Social History   Socioeconomic History   Marital status: Married    Spouse name: Not on file   Number of children: 1   Years of education: Not on file   Highest education level: Not on file  Occupational History   Occupation: retired     Associate Professor: HOMEMAKER  Tobacco Use   Smoking status: Never   Smokeless tobacco: Never  Vaping Use   Vaping status: Never Used  Substance and Sexual Activity   Alcohol use: Never   Drug use: No   Sexual activity: Not Currently    Partners: Male    Birth control/protection: Post-menopausal    Comment: First IC >16y/o, <5 Partners  Other Topics Concern   Not on file  Social History Narrative   Born in [place], ,moved to the [place]  in the 60s, live in [place] then in [place]   Lives w/ husband   H.S. Buyer, retail   Son lives in [place])    Social Drivers of Health   Financial Resource Strain: Medium Risk (04/21/2022)   Overall Financial Resource Strain (CARDIA)    Difficulty of Paying Living Expenses: Somewhat hard  Food Insecurity: No Food Insecurity (12/04/2022)   Hunger Vital Sign    Worried About Running Out of Food in the Last Year: Never true    Ran Out of Food in the Last Year: Never true  Transportation Needs: Not on file (11/14/2022)  Physical Activity: Inactive (12/04/2022)   Exercise Vital Sign    Days of Exercise per Week: 0 days    Minutes of Exercise per Session: 0 min  Stress: No Stress Concern Present (12/04/2022)   [employer] of Occupational Health - Occupational Stress Questionnaire    Feeling of Stress : Only a little  Social Connections:  Moderately Isolated (12/04/2022)   Social Connection and Isolation Panel    Frequency of Communication with Friends and Family: More than three times a week    Frequency of Social Gatherings with Friends and Family: Once a week    Attends Religious Services: Never    Database administrator or Organizations: No    Attends Banker Meetings: Never    Marital Status: Married  Catering manager Violence: Not At Risk (12/04/2022)   Humiliation, Afraid, Rape, and Kick questionnaire    Fear of Current or Ex-Partner: No    Emotionally Abused: No    Physically Abused: No    Sexually Abused: No    FAMILY HISTORY: Family History  Adopted: Yes  Problem Relation Age of Onset   Heart disease Mother 40   Heart disease Father 30   Skin cancer Sister  Colon cancer Maternal Grandfather        COLON,, FAMILY HX 7 MEMBERS   Breast cancer Maternal Aunt    Stroke Maternal Aunt    Breast cancer Maternal Aunt    Colon cancer Paternal Uncle        7 uncles   Rectal cancer Neg Hx    Stomach cancer Neg Hx     ALLERGIES:  is allergic to ciprofloxacin .  MEDICATIONS:  Current Outpatient Medications  Medication Sig Dispense Refill   ALPRAZolam  (XANAX ) 0.25 MG tablet TAKE 1 TABLET(0.25 MG) BY MOUTH DAILY AS NEEDED FOR ANXIETY 30 tablet 0   carvedilol  (COREG ) 25 MG tablet TAKE 1 TABLET(25 MG) BY MOUTH TWICE DAILY WITH A MEAL 180 tablet 1   cholecalciferol (VITAMIN D ) 1000 UNITS tablet Take 1,000 Units by mouth daily.     co-enzyme Q-10 30 MG capsule Take 30 mg by mouth daily.     dorzolamide-timolol (COSOPT) 22.3-6.8 MG/ML ophthalmic solution Place 1 drop into both eyes 2 (two) times daily.      glucose blood (ONETOUCH ULTRA) test strip CHECK BLOOD SUGAR NO MORE THAN 2 TIMES PER DAY 200 strip 12   metFORMIN  (GLUCOPHAGE ) 1000 MG tablet TAKE 1 TABLET BY MOUTH WITH BREAKFAST AND LUNCH THEN TAKE 1/2 TABLET BY MOUTH AT BEDTIME 225 tablet 1   NYSTATIN  powder Apply 1 Application topically 3 (three)  times daily. (Patient not taking: Reported on 09/05/2023)     OneTouch UltraSoft 2 Lancets MISC TEST BLOOD SUGAR NO MOORE THEN TWICE DAILY 200 each 12   RESTASIS 0.05 % ophthalmic emulsion Place 1 drop into both eyes as needed.     Semaglutide ,0.25 or 0.5MG /DOS, 2 MG/3ML SOPN INJECT 0.5 UNDER THE SKIN ONCE A WEEK     No current facility-administered medications for this visit.    REVIEW OF SYSTEMS:    10 Point review of Systems was done is negative except as noted above.  PHYSICAL EXAMINATION: ECOG PERFORMANCE STATUS: 2 - Symptomatic, <50% confined to bed  . Vitals:   09/25/23 1431  BP: (!) 152/59  Pulse: 69  Resp: 18  Temp: 97.9 F (36.6 C)  SpO2: 97%    Filed Weights   09/25/23 1431  Weight: 137 lb 12.8 oz (62.5 kg)    .Body mass index is 26.04 kg/m.   GENERAL:alert, in no acute distress and comfortable SKIN: no acute rashes, no significant lesions EYES: conjunctiva are pink and non-injected, sclera anicteric OROPHARYNX: MMM, no exudates, no oropharyngeal erythema or ulceration NECK: supple, no JVD LYMPH:  no palpable lymphadenopathy in the cervical, axillary or inguinal regions LUNGS: clear to auscultation b/l with normal respiratory effort HEART: regular rate & rhythm ABDOMEN:  normoactive bowel sounds , non tender, not distended. Extremity: no pedal edema PSYCH: alert & oriented x 3 with fluent speech NEURO: no focal motor/sensory deficits   LABORATORY DATA:  I have reviewed the data as listed  .    Latest Ref Rng & Units 09/04/2023   12:13 PM 03/06/2023   11:20 AM 12/04/2022    1:52 PM  CBC  WBC 4.0 - 10.5 K/uL 9.2  7.7  7.4   Hemoglobin 12.0 - 15.0 g/dL 30.8  65.7  84.6   Hematocrit 36.0 - 46.0 % 40.5  41.7  41.7   Platelets 150 - 400 K/uL 303  305  318.0        Latest Ref Rng & Units 09/04/2023   12:13 PM 06/15/2023   11:24 AM 05/15/2023  2:16 PM  CMP  Glucose 70 - 99 mg/dL 97   161   BUN 8 - 23 mg/dL 29   23   Creatinine 0.96 - 1.00 mg/dL  0.45   4.09   Sodium 811 - 145 mmol/L 137   137   Potassium 3.5 - 5.1 mmol/L 4.3   4.5   Chloride 98 - 111 mmol/L 102   98   CO2 22 - 32 mmol/L 30   29   Calcium  8.7 - 10.3 mg/dL 8.9 - 91.4 mg/dL 78.2    95.6   21.3   Total Protein 6.5 - 8.1 g/dL 8.4  7.5  7.8   Total Bilirubin 0.0 - 1.2 mg/dL 0.5   0.4   Alkaline Phos 38 - 126 U/L 64     AST 15 - 41 U/L 27   26   ALT 0 - 44 U/L 21   19      RADIOGRAPHIC STUDIES: I have personally reviewed the radiological images as listed and agreed with the findings in the report. DG Bone Survey Met Result Date: 09/22/2023 CLINICAL DATA:  Monoclonal paraproteinemia. Evaluate for bone lesions. EXAM: METASTATIC BONE SURVEY COMPARISON:  None Available. FINDINGS: Skull: No focal lytic lesion. Cervical Spine: No focal lytic lesion. Multilevel spondylosis and disc space height loss greatest C5-C7. Thoracic Spine: No focal lytic lesion. Wedging of a few mid to lower thoracic vertebral bodies is presumed chronic. Chest: No focal lytic lesion. Lumbar Spine: No focal lytic lesion. Pelvis: No focal lytic lesion.  Fibroids. Right Upper Extremity: No focal lytic lesion. Superior subluxation of the humeral head in relation to the glenoid compatible with rotator cuff pathology. Left Upper Extremity: No focal lytic lesion. Right Lower Extremity: No focal lytic lesion. Left Lower Extremity: No focal lytic lesion. IMPRESSION: No focal lytic lesion. Electronically Signed   By: Rozell Cornet M.D.   On: 09/22/2023 00:26    ASSESSMENT & PLAN:  89 y.o. 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 Most consistent with MGUS. This was picked up on workup for chronic idiopathic urticaria. Patient has normal tryptase levels and borderline elevated double-stranded DNA levels.  PLAN:  -09/13/2023 bone survey showed no focal lytic lesion -Discussed lab results from 09/04/2023 in detail with patient. CBC normal, showed WBC of 9.2K,  hemoglobin of 13.8, and platelets of 303K. -she has no anemia -24 hour urine test showed no significant abnormal proteins -Beta-2 microglobulin normal -myeloma lab from 09/04/2023 showed IgG lambda M protein 0.4 g/dL, improved from 0.6 g/dL -recommend monitoring with blood tests every 6 months to ensure her abnormal protein level does not suddenly increase -discussed that sometimes minimal amounts of M protein can be present in 3-4% of the population above age 41, called MGUS, which does not cause problems in most cases -CMP shows calcium  is normal at 10.3. Her calcium  was noted to be previously in high-10s. Educated patient that the amount of calcium  in the blood can change based on the amount of albumin protein and fluctuation in calcium  is not concerning.  -no sign of myeloma cancer in the bone marrow -there are no signs of bone tumors on whole body x-ray -there is some arthritis as expected including in the back -there is no indication for bone marrow biopsy at this time -discussed that her rotator cuff is injured and may limit her ROM -answered all of patient's questions in detail -patient shall return to clinic in 6 months with  labs prior to visit -discussed that if findings are stable or show improvement, there may be a role to extend visits to once a year follow-ups - no indication that treating patients MGUS will address her chronic urticarias.  FOLLOW-UP: Labs in 22 weeks RTC with Dr Salomon Cree in 24 weeks  The total time spent in the appointment was 25 minutes* .  All of the patient's questions were answered with apparent satisfaction. The patient knows to call the clinic with any problems, questions or concerns.   Jacquelyn Matt MD MS AAHIVMS [facility] Hematology/Oncology Physician [facility]  .*Total Encounter Time as defined by the Centers for Medicare and Medicaid Services includes, in addition to the face-to-face time of a patient visit (documented in the note above)  non-face-to-face time: obtaining and reviewing outside history, ordering and reviewing medications, tests or procedures, care coordination (communications with other health care professionals or caregivers) and documentation in the medical record.    I,Mitra Faeizi,acting as a Neurosurgeon for Jacquelyn Matt, MD.,have documented all relevant documentation on the behalf of Jacquelyn Matt, MD,as directed by  Jacquelyn Matt, MD while in the presence of Jacquelyn Matt, MD.  .I have reviewed the above documentation for accuracy and completeness, and I agree with the above. .Natiya Seelinger Kishore Kytzia Gienger MD

## 2023-10-03 ENCOUNTER — Ambulatory Visit: Payer: Self-pay

## 2023-10-03 NOTE — Telephone Encounter (Signed)
 FYI Only or Action Required?: FYI only for provider  Patient was last seen in primary care on 08/23/2023 by Zilphia Hilt, Charyl Coppersmith, MD. Called Nurse Triage reporting Back Pain. Symptoms began several days ago. Interventions attempted: Nothing. Symptoms are: stable.  Triage Disposition: See PCP When Office is Open (Within 3 Days) Scheduled   Patient/caregiver understands and will follow disposition?: Yes       Copied from CRM [id number]. Topic: Clinical - Red Word Triage >> Oct 03, 2023  9:54 AM Jenice Mitts wrote: Red Word that prompted transfer to Nurse Triage: Severe Pain   Patient is having severe pain in her back   ----------------------------------------------------------------------- From previous Reason for Contact - Scheduling: Patient/patient representative is calling to schedule an appointment. Refer to attachments for appointment information. Reason for Disposition . [1] Pain radiates into the thigh or further down the leg AND [2] one leg  Answer Assessment - Initial Assessment Questions 1. ONSET: When did the pain begin?      --------------    2. LOCATION: Where does it hurt? (upper, mid or lower back)     -- (Left)Back  pain, pelvic pain (left),  Left leg   3. SEVERITY: How bad is the pain?  (e.g., Scale 1-10; mild, moderate, or severe)   - MILD (1-3): Doesn't interfere with normal activities.    - MODERATE (4-7): Interferes with normal activities or awakens from sleep.    - SEVERE (8-10): Excruciating pain, unable to do any normal activities.       --- Intermittent: 3-4/10     4. PATTERN: Is the pain constant? (e.g., yes, no; constant, intermittent)      --- Intermittent    5. RADIATION: Does the pain shoot into your legs or somewhere else?     Radiates from low back down to left lower abd, down to her left leg    6. CAUSE:  What do you think is causing the back pain?      ---------------   8. MEDICINES: What have you taken so far for  the pain? (e.g., nothing, acetaminophen , NSAIDS)     ----------- Narcotics ( unsure of the name) , but doesn't want to take long term    9. NEUROLOGIC SYMPTOMS: Do you have any weakness, numbness, or problems with bowel/bladder control?     --------Denies    10. OTHER SYMPTOMS: Do you have any other symptoms? (e.g., fever, abdomen pain, burning with urination, blood in urine)       -Denies     Additional Information:  Protocols used: Back Pain-A-AH

## 2023-10-03 NOTE — Telephone Encounter (Signed)
 noted

## 2023-10-05 ENCOUNTER — Encounter: Payer: Self-pay | Admitting: Family Medicine

## 2023-10-05 ENCOUNTER — Ambulatory Visit

## 2023-10-05 ENCOUNTER — Ambulatory Visit (INDEPENDENT_AMBULATORY_CARE_PROVIDER_SITE_OTHER): Admitting: Family Medicine

## 2023-10-05 VITALS — BP 118/60 | HR 73 | Resp 16 | Ht 61.0 in | Wt 137.4 lb

## 2023-10-05 DIAGNOSIS — M545 Low back pain, unspecified: Secondary | ICD-10-CM | POA: Diagnosis not present

## 2023-10-05 DIAGNOSIS — G8929 Other chronic pain: Secondary | ICD-10-CM | POA: Diagnosis not present

## 2023-10-05 DIAGNOSIS — M5136 Other intervertebral disc degeneration, lumbar region with discogenic back pain only: Secondary | ICD-10-CM | POA: Diagnosis not present

## 2023-10-05 DIAGNOSIS — D472 Monoclonal gammopathy: Secondary | ICD-10-CM

## 2023-10-05 DIAGNOSIS — I700 Atherosclerosis of aorta: Secondary | ICD-10-CM | POA: Diagnosis not present
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
## 2023-10-05 NOTE — Patient Instructions (Addendum)
 A few things to remember from today's visit:  Chronic midline low back pain, unspecified whether sciatica present - Plan: DG Lumbar Spine Complete  No camnios hoy. Siga viendo al ortopedista. Si se decide tomar el Gabapentin  o lyrica hagame saber.
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
## 2023-10-18 ENCOUNTER — Ambulatory Visit

## 2023-10-18 ENCOUNTER — Encounter: Payer: Self-pay | Admitting: Internal Medicine

## 2023-10-18 ENCOUNTER — Ambulatory Visit: Payer: Medicare Other | Attending: Internal Medicine | Admitting: Internal Medicine

## 2023-10-18 VITALS — BP 132/69 | HR 61 | Resp 14 | Ht 61.0 in | Wt 136.0 lb

## 2023-10-18 DIAGNOSIS — G8929 Other chronic pain: Secondary | ICD-10-CM

## 2023-10-18 DIAGNOSIS — M25562 Pain in left knee: Secondary | ICD-10-CM

## 2023-10-18 DIAGNOSIS — I7300 Raynaud's syndrome without gangrene: Secondary | ICD-10-CM

## 2023-10-18 DIAGNOSIS — I83893 Varicose veins of bilateral lower extremities with other complications: Secondary | ICD-10-CM

## 2023-10-18 DIAGNOSIS — L508 Other urticaria: Secondary | ICD-10-CM

## 2023-10-18 DIAGNOSIS — M25561 Pain in right knee: Secondary | ICD-10-CM | POA: Diagnosis not present

## 2023-10-18 DIAGNOSIS — M79642 Pain in left hand: Secondary | ICD-10-CM

## 2023-10-18 DIAGNOSIS — M79641 Pain in right hand: Secondary | ICD-10-CM | POA: Diagnosis not present

## 2023-10-18 DIAGNOSIS — M150 Primary generalized (osteo)arthritis: Secondary | ICD-10-CM

## 2023-10-18 DIAGNOSIS — R768 Other specified abnormal immunological findings in serum: Secondary | ICD-10-CM | POA: Diagnosis not present

## 2023-10-21 ENCOUNTER — Other Ambulatory Visit: Payer: Self-pay | Admitting: Internal Medicine

## 2023-10-21 DIAGNOSIS — E119 Type 2 diabetes mellitus without complications: Secondary | ICD-10-CM

## 2023-10-22 LAB — C-REACTIVE PROTEIN: CRP: 23.9 mg/L — ABNORMAL HIGH (ref <8.0)

## 2023-10-22 LAB — CARDIOLIPIN ANTIBODIES, IGG, IGM, IGA
Anticardiolipin IgA: <2 [APL'U]/mL (ref <20.0)
Anticardiolipin IgG: <2 [GPL'U]/mL (ref <20.0)
Anticardiolipin IgM: <2 [MPL'U]/mL (ref <20.0)

## 2023-10-22 LAB — BETA-2 GLYCOPROTEIN ANTIBODIES
Beta-2 Glyco 1 IgA: <2 U/mL
Beta-2 Glyco 1 IgM: <2 U/mL
Beta-2 Glyco I IgG: <2 U/mL

## 2023-10-22 LAB — ANTI-DNA ANTIBODY, DOUBLE-STRANDED: ds DNA Ab: 12 [IU]/mL — ABNORMAL HIGH

## 2023-10-22 LAB — SEDIMENTATION RATE: Sed Rate: 45 mm/h — ABNORMAL HIGH (ref 0–30)

## 2023-10-22 LAB — RHEUMATOID FACTOR: Rheumatoid fact SerPl-aCnc: <10 [IU]/mL (ref <14)

## 2023-10-22 LAB — C3 AND C4
C3 Complement: 178 mg/dL
C4 Complement: 32 mg/dL

## 2023-10-23 NOTE — Progress Notes (Signed)
 Follow up with Dr Onesimo 10/05/23, who reviewed test results with patient and answered questions. Patient informed that results show that there is no disease process that requires treatment at this time.

## 2023-10-27 ENCOUNTER — Ambulatory Visit: Payer: Self-pay | Admitting: Family Medicine

## 2023-11-13 ENCOUNTER — Encounter: Payer: Self-pay | Admitting: Internal Medicine

## 2023-11-13 ENCOUNTER — Ambulatory Visit: Attending: Internal Medicine | Admitting: Internal Medicine

## 2023-11-13 VITALS — BP 144/73 | HR 67 | Resp 14 | Ht 61.0 in | Wt 136.0 lb

## 2023-11-13 DIAGNOSIS — R768 Other specified abnormal immunological findings in serum: Secondary | ICD-10-CM

## 2023-11-13 DIAGNOSIS — M159 Polyosteoarthritis, unspecified: Secondary | ICD-10-CM | POA: Diagnosis not present

## 2023-11-13 DIAGNOSIS — R700 Elevated erythrocyte sedimentation rate: Secondary | ICD-10-CM

## 2023-11-13 DIAGNOSIS — I7300 Raynaud's syndrome without gangrene: Secondary | ICD-10-CM | POA: Diagnosis not present

## 2023-11-13 DIAGNOSIS — L508 Other urticaria: Secondary | ICD-10-CM | POA: Diagnosis not present

## 2023-11-13 MED ORDER — HYDROXYCHLOROQUINE SULFATE 200 MG PO TABS: 200.0000 mg | ORAL_TABLET | Freq: Every day | ORAL | 2 refills | Status: DC
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
## 2023-11-19 ENCOUNTER — Ambulatory Visit: Admitting: Orthopedic Surgery

## 2023-11-19 DIAGNOSIS — M542 Cervicalgia: Secondary | ICD-10-CM

## 2023-11-19 MED ORDER — TRAMADOL HCL 50 MG PO TABS: 50.0000 mg | ORAL_TABLET | Freq: Four times a day (QID) | ORAL | 0 refills | Status: AC | PRN

## 2023-11-19 NOTE — Progress Notes (Signed)
 Orthopedic Spine Surgery Office Note   Assessment: Patient is a 89 y.o. female with chronic neck pain after a C1 and C2 fracture     Plan: -Talk about options again including injections and surgery.  I also mentioned she could try a TENS unit since she is not not wanting to do either the injections or surgery.  We will also continue the tramadol  which was prescribed to her today again. -Can continue with soft collar for particularly painful days -Patient should return to office in 3 months, x-rays at next visit: none     Patient expressed understanding of the plan and all questions were answered to the patient's satisfaction.    ___________________________________________________________________________     History:   Patient is a 89 y.o. female who presents today for follow up of C1 and C2 fractures.  Patient continues to have neck pain.  The worst of it is in her upper cervical spine but she also has pain that she feels along the paraspinal muscles that goes to the thoracic spine.  It does not extend past that.  She does not have any radiating arm pain.  She has not developed any new symptoms since she was last seen.  Pain is worse with motion and improves with rest.  She has continued to use 1/2 tablet of tramadol  when pain is bad.   Treatments tried: collar use, activity modification, oxycodone , tylenol , NSAIDs, tramadol      Physical Exam:   General: no acute distress, appears stated age Neurologic: alert, answering questions appropriately, following commands Respiratory: unlabored breathing on room air, symmetric chest rise Psychiatric: appropriate affect, normal cadence to speech     MSK (spine):   -Strength exam                                                   Left                  Right Grip strength                5/5                  5/5 Interosseus                  5/5                  5/5 Wrist extension            5/5                  5/5 Wrist flexion                  5/5                  5/5 Elbow flexion                5/5                  5/5 Deltoid                          5/5                  4/5     -Sensory exam  Sensation intact to light touch in C5-T1 nerve distributions of bilateral upper extremities     Imaging: XRs of the cervical spine from 06/15/2022 were previously independently reviewed and interpreted, showing ~48mm lateral mass overhang on the left side at C1/2. Displaced C2 lateral mass with depression. Decreased joint space at C1/2 on the left. No lateral mass overhang on the contralateral side. Degenerative changes throughout the cervical spine.    CT of the cervical spine from 07/26/2023 were independently reviewed and interpreted, showing healed C1 and C2 fractures. There is arthrosis seen at the C2/3 and C3/4 facet joints. Appears to be autofusion across the right C2/3 joint. No new fractures seen.      Patient name: Patricia Moore Patient MRN: [id number] Date of visit: 11/19/23

## 2023-12-03 ENCOUNTER — Telehealth: Payer: Self-pay | Admitting: *Deleted

## 2023-12-03 NOTE — Telephone Encounter (Signed)
 Left message on machine for patient   Semaglutide ,0.25 or 0.5MG /DOS, 2 MG/3ML SOPN   4 pens are available to pick up from patient assistance.

## 2023-12-05 ENCOUNTER — Encounter (INDEPENDENT_AMBULATORY_CARE_PROVIDER_SITE_OTHER): Admitting: Internal Medicine

## 2023-12-05 DIAGNOSIS — F419 Anxiety disorder, unspecified: Secondary | ICD-10-CM

## 2023-12-05 MED ORDER — ALPRAZOLAM 0.25 MG PO TABS: ORAL_TABLET | ORAL | 0 refills | Status: DC

## 2023-12-05 NOTE — Progress Notes (Signed)
 This encounter was created in error - please disregard.

## 2023-12-11 DIAGNOSIS — J3489 Other specified disorders of nose and nasal sinuses: Secondary | ICD-10-CM | POA: Diagnosis not present

## 2023-12-11 DIAGNOSIS — H9113 Presbycusis, bilateral: Secondary | ICD-10-CM | POA: Diagnosis not present

## 2023-12-20 ENCOUNTER — Encounter: Payer: Self-pay | Admitting: Cardiology

## 2023-12-28 DIAGNOSIS — H903 Sensorineural hearing loss, bilateral: Secondary | ICD-10-CM | POA: Diagnosis not present

## 2024-01-09 ENCOUNTER — Other Ambulatory Visit: Payer: Self-pay | Admitting: Internal Medicine

## 2024-01-09 DIAGNOSIS — I10 Essential (primary) hypertension: Secondary | ICD-10-CM

## 2024-01-16 DIAGNOSIS — H57813 Brow ptosis, bilateral: Secondary | ICD-10-CM | POA: Diagnosis not present

## 2024-01-16 DIAGNOSIS — E119 Type 2 diabetes mellitus without complications: Secondary | ICD-10-CM | POA: Diagnosis not present

## 2024-01-16 DIAGNOSIS — H04123 Dry eye syndrome of bilateral lacrimal glands: Secondary | ICD-10-CM | POA: Diagnosis not present

## 2024-01-16 DIAGNOSIS — H401131 Primary open-angle glaucoma, bilateral, mild stage: Secondary | ICD-10-CM | POA: Diagnosis not present

## 2024-01-16 DIAGNOSIS — H527 Unspecified disorder of refraction: Secondary | ICD-10-CM | POA: Diagnosis not present
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
## 2024-02-05 ENCOUNTER — Telehealth: Payer: Self-pay

## 2024-02-05 NOTE — Progress Notes (Signed)
   02/05/2024  Patient ID: Patricia Moore, female   DOB: 03/10/1934, 89 y.o.   MRN: [id number]  Attempted to contact patient with assistance of Spanish interpreter to notify of Ozempic  PAP program ending for 2026 for Medicare patients. Left HIPAA compliant message for patient to return my call at their convenience.   Jon VEAR Lindau, PharmD Clinical Pharmacist [phone]

## 2024-02-11 ENCOUNTER — Other Ambulatory Visit: Payer: Self-pay | Admitting: *Deleted

## 2024-02-11 MED ORDER — ACCU-CHEK SOFTCLIX LANCETS MISC: 1.0000 | Freq: Every day | 12 refills | Status: AC

## 2024-02-11 MED ORDER — ACCU-CHEK GUIDE TEST VI STRP: 1.0000 | ORAL_STRIP | Freq: Every day | 12 refills | Status: AC

## 2024-02-11 MED ORDER — ACCU-CHEK GUIDE W/DEVICE KIT: 1.0000 | PACK | Freq: Every day | 1 refills | Status: AC

## 2024-02-13 ENCOUNTER — Encounter: Payer: Self-pay | Admitting: Internal Medicine

## 2024-02-13 ENCOUNTER — Ambulatory Visit: Attending: Internal Medicine | Admitting: Internal Medicine

## 2024-02-13 VITALS — BP 164/77 | HR 85 | Temp 97.9°F | Resp 16 | Ht 61.0 in | Wt 140.4 lb

## 2024-02-13 DIAGNOSIS — L508 Other urticaria: Secondary | ICD-10-CM

## 2024-02-13 DIAGNOSIS — R700 Elevated erythrocyte sedimentation rate: Secondary | ICD-10-CM | POA: Diagnosis not present

## 2024-02-13 DIAGNOSIS — R7689 Other specified abnormal immunological findings in serum: Secondary | ICD-10-CM

## 2024-02-13 DIAGNOSIS — I7300 Raynaud's syndrome without gangrene: Secondary | ICD-10-CM | POA: Diagnosis not present

## 2024-02-14 ENCOUNTER — Encounter: Admitting: Internal Medicine

## 2024-02-14 ENCOUNTER — Encounter: Payer: Self-pay | Admitting: Internal Medicine

## 2024-02-14 NOTE — Progress Notes (Signed)
 This encounter was created in error - please disregard.

## 2024-02-15 ENCOUNTER — Encounter: Payer: Self-pay | Admitting: Internal Medicine

## 2024-02-15 ENCOUNTER — Telehealth: Payer: Self-pay

## 2024-02-15 LAB — HM DEXA SCAN

## 2024-02-15 LAB — HM MAMMOGRAPHY

## 2024-02-15 NOTE — Telephone Encounter (Signed)
 Gave pt a call to let her know Novo Nordisk is no longer under the program,spoke w/pt explain she will be calling her new Ins to see if this med will be covered and will call back once she finds out.

## 2024-02-18 ENCOUNTER — Encounter: Payer: Self-pay | Admitting: Obstetrics and Gynecology

## 2024-02-18 ENCOUNTER — Encounter: Payer: Self-pay | Admitting: Radiology

## 2024-02-18 ENCOUNTER — Ambulatory Visit: Payer: Self-pay | Admitting: Obstetrics and Gynecology

## 2024-02-20 ENCOUNTER — Ambulatory Visit: Admitting: Orthopedic Surgery

## 2024-02-20 DIAGNOSIS — M542 Cervicalgia: Secondary | ICD-10-CM | POA: Diagnosis not present

## 2024-02-20 MED ORDER — TRAMADOL HCL 50 MG PO TABS: 50.0000 mg | ORAL_TABLET | Freq: Four times a day (QID) | ORAL | 0 refills | Status: AC | PRN

## 2024-02-20 NOTE — Progress Notes (Signed)
 Orthopedic Spine Surgery Office Note   Assessment: Patient is a 89 y.o. female with chronic neck pain after a C1 and C2 fracture     Plan: -She asked about acupuncture as a treatment. I told her I do not have any strong medical evidence to support this treatment but it would be okay to try and if she is noticing improvement, she can continue to do it -I told her she should probably not go to a chiropractor as I think manipulation of her neck could result in further injury -Prescribed more tramadol  for her today -Can continue with soft collar for particularly painful days -Patient should return to office in 3 months, x-rays at next visit: none     Patient expressed understanding of the plan and all questions were answered to the patient's satisfaction.    ___________________________________________________________________________     History:   Patient is a 89 y.o. female who presents today for follow up of C1 and C2 fractures. She is still having neck pain. Still worse with motion at the neck. No radicular pain. No symptoms of myelopathy. Taking 1/2 tablet of tramadol  to help with the pain at night.    Treatments tried: collar use, activity modification, oxycodone , tylenol , NSAIDs, tramadol      Physical Exam:   General: no acute distress, appears stated age Neurologic: alert, answering questions appropriately, following commands Respiratory: unlabored breathing on room air, symmetric chest rise Psychiatric: appropriate affect, normal cadence to speech     MSK (spine):   -Strength exam                                                   Left                  Right Grip strength                5/5                  5/5 Interosseus                  5/5                  5/5 Wrist extension            5/5                  5/5 Wrist flexion                 5/5                  5/5 Elbow flexion                5/5                  5/5 Deltoid                          5/5                   4/5     -Sensory exam                         Sensation intact to light touch in C5-T1 nerve distributions of bilateral upper extremities  Imaging: XRs of the cervical spine from 06/15/2022 were previously independently reviewed and interpreted, showing ~47mm lateral mass overhang on the left side at C1/2. Displaced C2 lateral mass with depression. Decreased joint space at C1/2 on the left. No lateral mass overhang on the contralateral side. Degenerative changes throughout the cervical spine.    CT of the cervical spine from 07/26/2023 was previously independently reviewed and interpreted, showing healed C1 and C2 fractures. There is arthrosis seen at the C2/3 and C3/4 facet joints. Appears to be autofusion across the right C2/3 joint. No new fractures seen.      Patient name: Patricia Moore Patient MRN: [id number] Date of visit: 02/20/24

## 2024-02-21 NOTE — Progress Notes (Signed)
 Patient scheduled 03/04/24

## 2024-02-25 ENCOUNTER — Other Ambulatory Visit: Payer: Self-pay

## 2024-02-25 DIAGNOSIS — D472 Monoclonal gammopathy: Secondary | ICD-10-CM

## 2024-02-26 ENCOUNTER — Inpatient Hospital Stay: Attending: Hematology

## 2024-02-26 DIAGNOSIS — D472 Monoclonal gammopathy: Secondary | ICD-10-CM

## 2024-02-26 LAB — CBC WITH DIFFERENTIAL (CANCER CENTER ONLY)
Abs Immature Granulocytes: 0.04 K/uL (ref 0.00–0.07)
Basophils Absolute: 0.1 K/uL (ref 0.0–0.1)
Basophils Relative: 1 %
Eosinophils Absolute: 0.6 K/uL — ABNORMAL HIGH (ref 0.0–0.5)
Eosinophils Relative: 6 %
HCT: 41.8 % (ref 36.0–46.0)
Hemoglobin: 13.8 g/dL (ref 12.0–15.0)
Immature Granulocytes: 0 %
Lymphocytes Relative: 26 %
Lymphs Abs: 2.6 K/uL (ref 0.7–4.0)
MCH: 30.8 pg (ref 26.0–34.0)
MCHC: 33 g/dL (ref 30.0–36.0)
MCV: 93.3 fL (ref 80.0–100.0)
Monocytes Absolute: 0.8 K/uL (ref 0.1–1.0)
Monocytes Relative: 8 %
Neutro Abs: 5.7 K/uL (ref 1.7–7.7)
Neutrophils Relative %: 59 %
Platelet Count: 315 K/uL (ref 150–400)
RBC: 4.48 MIL/uL (ref 3.87–5.11)
RDW: 12.1 % (ref 11.5–15.5)
WBC Count: 9.9 K/uL (ref 4.0–10.5)
nRBC: 0 % (ref 0.0–0.2)

## 2024-02-26 LAB — CMP (CANCER CENTER ONLY)
ALT: 15 U/L (ref 0–44)
AST: 26 U/L (ref 15–41)
Albumin: 4.8 g/dL (ref 3.5–5.0)
Alkaline Phosphatase: 79 U/L (ref 38–126)
Anion gap: 7 (ref 5–15)
BUN: 22 mg/dL (ref 8–23)
CO2: 29 mmol/L (ref 22–32)
Calcium: 10.4 mg/dL — ABNORMAL HIGH (ref 8.9–10.3)
Chloride: 101 mmol/L (ref 98–111)
Creatinine: 0.9 mg/dL (ref 0.44–1.00)
GFR, Estimated: >60 mL/min (ref >60)
Glucose, Bld: 72 mg/dL (ref 70–99)
Potassium: 4.8 mmol/L (ref 3.5–5.1)
Sodium: 137 mmol/L (ref 135–145)
Total Bilirubin: 0.4 mg/dL (ref 0.0–1.2)
Total Protein: 8.9 g/dL — ABNORMAL HIGH (ref 6.5–8.1)

## 2024-02-26 LAB — LACTATE DEHYDROGENASE: LDH: 136 U/L (ref 105–235)

## 2024-02-27 LAB — KAPPA/LAMBDA LIGHT CHAINS
Kappa free light chain: 37 mg/L — ABNORMAL HIGH (ref 3.3–19.4)
Kappa, lambda light chain ratio: 1.37 (ref 0.26–1.65)
Lambda free light chains: 27.1 mg/L — ABNORMAL HIGH (ref 5.7–26.3)

## 2024-03-01 LAB — MULTIPLE MYELOMA PANEL, SERUM
Albumin SerPl Elph-Mcnc: 4.2 g/dL (ref 2.9–4.4)
Albumin/Glob SerPl: 1 (ref 0.7–1.7)
Alpha 1: 0.2 g/dL (ref 0.0–0.4)
Alpha2 Glob SerPl Elph-Mcnc: 1 g/dL (ref 0.4–1.0)
B-Globulin SerPl Elph-Mcnc: 1.5 g/dL — ABNORMAL HIGH (ref 0.7–1.3)
Gamma Glob SerPl Elph-Mcnc: 1.7 g/dL (ref 0.4–1.8)
Globulin, Total: 4.3 g/dL — ABNORMAL HIGH (ref 2.2–3.9)
IgA: 438 mg/dL — ABNORMAL HIGH (ref 64–422)
IgG (Immunoglobin G), Serum: 1722 mg/dL — ABNORMAL HIGH (ref 586–1602)
IgM (Immunoglobulin M), Srm: 56 mg/dL (ref 26–217)
M Protein SerPl Elph-Mcnc: 0.7 g/dL — ABNORMAL HIGH
Total Protein ELP: 8.5 g/dL (ref 6.0–8.5)

## 2024-03-04 ENCOUNTER — Ambulatory Visit: Payer: Medicare Other | Admitting: Obstetrics and Gynecology

## 2024-03-04 ENCOUNTER — Telehealth: Payer: Self-pay | Admitting: Obstetrics and Gynecology

## 2024-03-04 NOTE — Telephone Encounter (Signed)
 Patient had an appointment today for a breast and pelvic exam, and this was cancelled because she did not qualify for a yearly visit.   I reviewed her chart just now, and she needs an appointment for discussion of her bone density which showed osteopenia with increased risk of hip fracture.   This is treated with prescription medication to strengthen her bone and reduce risk of fracture.   Please have her schedule an appointment at this office or with her PCP to discuss this further.    Her PCP was the ordering physician for the bone density.    I am sorry for any inconvenience.

## 2024-03-04 NOTE — Progress Notes (Deleted)
 89 y.o. G46P0011 Married Hispanic female here for a breast and pelvic exam.    The patient is also followed for ***.  PCP: Theophilus Andrews, Tully GRADE, MD   Patient's last menstrual period was 04/17/1988.           Sexually active: No.  The current method of family planning is post menopausal status.    Menopausal hormone therapy:  n/a Exercising: {yes no:314532}  {types:19826} Smoker:  no  OB History     Gravida  2   Para  1   Term      Preterm      AB  1   Living  1      SAB  1   IAB      Ectopic      Multiple      Live Births              HEALTH MAINTENANCE: Last 2 paps: 05/12/10 nge, 06/12/08 normal  History of abnormal Pap or positive HPV:  no Mammogram:  02/15/24 Breast Density cat C, BIRADS Cat 1 neg  Colonoscopy: 04/02/19  Bone Density:  02/15/24  Result  osteopenia    Immunization History  Administered Date(s) Administered   Fluad Quad(high Dose 65+) 12/13/2018, 01/31/2022   Fluad Trivalent(High Dose 65+) 12/14/2022, 12/22/2023   INFLUENZA, HIGH DOSE SEASONAL PF 12/29/2015, 12/25/2016, 01/11/2018, 01/17/2020   Influenza Split 01/27/2011, 03/04/2012, 05/14/2014   Influenza Whole 01/16/2008, 03/18/2009   Influenza,inj,Quad PF,6+ Mos 01/08/2013, 12/15/2014   Influenza-Unspecified 01/20/2021   PFIZER(Purple Top)SARS-COV-2 Vaccination 05/01/2019, 05/19/2019, 02/06/2020, 11/04/2020   Pneumococcal Conjugate-13 02/15/2015   Pneumococcal Polysaccharide-23 03/17/2005   Td 03/17/2006   Tdap 10/06/2017   Zoster, Live 02/01/2010      reports that she has never smoked. She has been exposed to tobacco smoke. She has never used smokeless tobacco. She reports that she does not currently use alcohol. She reports that she does not use drugs.  Past Medical History:  Diagnosis Date   Anxiety 12/09/2013   Arthritis    Cataract    Diabetes mellitus    Diverticulitis    patient states has had three attacks    Diverticulosis    DJD (degenerative joint  disease) 12/14/2014   Glaucoma    Hx of adenomatous colonic polyps    Hyperlipidemia    Hypertension    Intraductal papilloma    bx nef x 2 in the 70s-90s   Saccular aneurysm    SCC (squamous cell carcinoma) 03/2011   sees dermatology    Past Surgical History:  Procedure Laterality Date   BREAST BIOPSY  '91, '95   INTRADUCTAL PAPILLOMA   BREAST LUMPECTOMY     CATARACT EXTRACTION Bilateral 09/2014   CLOSED REDUCTION ULNAR SHAFT Right 10/06/2017   Procedure: CLOSED REDUCTION DISLOCATED RIGHT SHOULDER;  Surgeon: Vernetta Lonni GRADE, MD;  Location: [facility] OR;  Service: Orthopedics;  Laterality: Right;   COLONOSCOPY      Current Outpatient Medications  Medication Sig Dispense Refill   Accu-Chek Softclix Lancets lancets 1 each by Other route daily. Dx E11.9 100 each 12   ALPRAZolam  (XANAX ) 0.25 MG tablet TAKE 1 TABLET(0.25 MG) BY MOUTH DAILY AS NEEDED FOR ANXIETY 30 tablet 0   Blood Glucose Monitoring Suppl (ACCU-CHEK GUIDE) w/Device KIT 1 each by Does not apply route daily. 1 kit 1   carvedilol  (COREG ) 25 MG tablet TAKE 1 TABLET(25 MG) BY MOUTH TWICE DAILY WITH A MEAL 180 tablet 1   cholecalciferol (VITAMIN D ) 1000  UNITS tablet Take 1,000 Units by mouth daily.     co-enzyme Q-10 30 MG capsule Take 30 mg by mouth daily.     dorzolamide-timolol (COSOPT) 22.3-6.8 MG/ML ophthalmic solution Place 1 drop into both eyes 2 (two) times daily.      glucose blood (ACCU-CHEK GUIDE TEST) test strip 1 each by Other route daily. Dx E11.9 100 each 12   metFORMIN  (GLUCOPHAGE ) 1000 MG tablet TAKE 1 TABLET BY MOUTH WITH BREAKFAST AND LUNCH THEN TAKE 1/2 TABLET BY MOUTH AT BEDTIME 225 tablet 1   NYSTATIN  powder Apply 1 Application topically 3 (three) times daily.     ONETOUCH ULTRA TEST test strip USE TO CHECK BLOOD SUGAR UP TO 2 TIMES PER DAY 200 strip 12   OneTouch UltraSoft 2 Lancets MISC TEST BLOOD SUGAR NO MOORE THEN TWICE DAILY 200 each 12   RESTASIS 0.05 % ophthalmic emulsion Place 1 drop into both  eyes as needed.     Semaglutide ,0.25 or 0.5MG /DOS, 2 MG/3ML SOPN INJECT 0.5 UNDER THE SKIN ONCE A WEEK     No current facility-administered medications for this visit.    ALLERGIES: Ciprofloxacin  hcl and Ciprofloxacin   Family History  Adopted: Yes  Problem Relation Age of Onset   Heart disease Mother 66   Heart disease Father 50   Skin cancer Sister    Colon cancer Maternal Grandfather        COLON,, FAMILY HX 7 MEMBERS   Breast cancer Maternal Aunt    Stroke Maternal Aunt    Breast cancer Maternal Aunt    Colon cancer Paternal Uncle        7 uncles   Rectal cancer Neg Hx    Stomach cancer Neg Hx     Review of Systems  PHYSICAL EXAM:  LMP 04/17/1988     General appearance: alert, cooperative and appears stated age Head: normocephalic, without obvious abnormality, atraumatic Neck: no adenopathy, supple, symmetrical, trachea midline and thyroid  normal to inspection and palpation Lungs: clear to auscultation bilaterally Breasts: normal appearance, no masses or tenderness, No nipple retraction or dimpling, No nipple discharge or bleeding, No axillary adenopathy Heart: regular rate and rhythm Abdomen: soft, non-tender; no masses, no organomegaly Extremities: extremities normal, atraumatic, no cyanosis or edema Skin: skin color, texture, turgor normal. No rashes or lesions Lymph nodes: cervical, supraclavicular, and axillary nodes normal. Neurologic: grossly normal  Pelvic: External genitalia:  no lesions              No abnormal inguinal nodes palpated.              Urethra:  normal appearing urethra with no masses, tenderness or lesions              Bartholins and Skenes: normal                 Vagina: normal appearing vagina with normal color and discharge, no lesions              Cervix: no lesions              Pap taken: {yes no:314532} Bimanual Exam:  Uterus:  normal size, contour, position, consistency, mobility, non-tender              Adnexa: no mass, fullness,  tenderness              Rectal exam: {yes no:314532}.  Confirms.              Anus:  normal sphincter tone,  no lesions  Chaperone was present for exam:  {BSCHAPERONE:31226::Emily F, CMA}  ASSESSMENT: Encounter for breast and pelvic exam.   ***  PLAN: Mammogram screening discussed. Self breast awareness reviewed. Pap and HRV collected:  {yes no:314532} Guidelines for Calcium , Vitamin D , regular exercise program including cardiovascular and weight bearing exercise. Medication refills:  *** {LABS (Optional):23779} Follow up:  ***    Additional counseling given.  {yes [id number]. ***  total time was spent for this patient encounter, including preparation, face-to-face counseling with the patient, coordination of care, and documentation of the encounter in addition to doing the breast and pelvic exam.

## 2024-03-04 NOTE — Telephone Encounter (Signed)
 Left message to call back.

## 2024-03-05 NOTE — Telephone Encounter (Signed)
 Call returned to patient, Left message to call [facility] Triage at [phone], option 4.

## 2024-03-05 NOTE — Telephone Encounter (Signed)
 Left message to call [facility] Triage at [phone], option 4.

## 2024-03-06 NOTE — Telephone Encounter (Signed)
 Spoke with patient using [facility] ID# [phone]. Advised per Dr. Nikki. Patient declines OV and treatment for osteopenia at this time. Patient states she will also plan to discuss with PCP, she will call if she changes her mind. Patient appreciative of call.   Routing to provider for final review. Patient is agreeable to disposition. Will close encounter.

## 2024-03-11 ENCOUNTER — Inpatient Hospital Stay: Admitting: Hematology

## 2024-03-11 ENCOUNTER — Other Ambulatory Visit: Payer: Self-pay | Admitting: Internal Medicine

## 2024-03-11 VITALS — BP 116/82 | HR 74 | Temp 97.9°F | Resp 18 | Wt 137.7 lb

## 2024-03-11 DIAGNOSIS — D472 Monoclonal gammopathy: Secondary | ICD-10-CM

## 2024-03-11 DIAGNOSIS — E119 Type 2 diabetes mellitus without complications: Secondary | ICD-10-CM

## 2024-03-11 NOTE — Progress Notes (Signed)
 HEMATOLOGY ONCOLOGY PROGRESS NOTE  Date of service: 03/11/2024  Patient Care Team: Theophilus Andrews, Tully GRADE, MD as PCP - General (Internal Medicine) Lavona Agent, MD as PCP - Cardiology (Cardiology) Joshua Sieving, MD as Consulting Physician (Dermatology) Bonner Ade, MD as Consulting Physician (Physical Medicine and Rehabilitation) Skeet Juliene SAUNDERS, DO as Consulting Physician (Neurology) Abran Norleen SAILOR, MD as Consulting Physician (Gastroenterology) Gennie Dover, MD as Referring Physician (Ophthalmology) Elana Montie CROME, RN as Nurse Navigator  CHIEF COMPLAINT/PURPOSE OF CONSULTATION: Follow-up for continued evaluation and management of elevated M-spike on SPEP, most consistent with MGUS.  HISTORY OF PRESENTING ILLNESS:  Patricia Moore is a wonderful 89 y.o. female who has been referred to us  by Luke Needle, DO, for evaluation and management of elevated M-spike on SPEP 0.6 g/dL.    Patient has a history of hypertension, dyslipidemia, diabetes, degenerative joint disease who has been following with Dr. Luke for recurrent urticaria of unknown etiology.  As a part of her workup for idiopathic chronic urticaria she had some labs that have previously showed an slightly elevated double-stranded DNA antibody for which she has been referred to rheumatology Dr. Jeannetta.   She was also noted to have a mild IgG lambda monoclonal protein spike of 0.6 g/dL and therefore was referred to us  for further evaluation of her monoclonal paraproteinemia.  She also had a tryptase level done in November 2024 which was within normal limits at 3.2. On labs in November 2024 patient had no anemia with a hemoglobin of 13.6 WBC counts and platelets, CMP did show some borderline intermittent hypercalcemia with a calcium  level of 10.7 mean of 4.7 however the corrected calcium  levels was within normal limits at 10.1.   Patient was started on Zyrtec 10 mg p.o. daily and Pepcid  20 mg p.o. twice daily.  She was recommended  to avoid potential triggers including alcohol tight clothing NSAIDs hot showers or getting overheated.   Patient notes no acute new focal symptoms.  No localized new bone pains.  No fevers no chills no night sweats.  No specific red swollen painful joints.  Other active fixed skin rashes at this time. No weight loss. No chest pain or shortness of breath.  No change in bowel habits. Patient notes that other than the hives she feels well overall. Does not note any specific association animal exposures or food exposures and denies any new use of cosmetics.     SUMMARY OF ONCOLOGIC HISTORY: Oncology History   No history exists.    INTERVAL HISTORY: Patricia Moore is a 89 y.o. female who is here today for continued evaluation and management of possible MGUS. She is accompanied by her husband today.  she was last seen by me on 09/25/2023; at the time she did not have any symptomatic concerns, but was here for a consultation due to elevated M-spike protein levels on her SPEP.  Today, she is concerned about her ear pain. She reports it affects both of her ears for the past 2 months. Her PCP examined her and her ears looked clear. She also reports some nasal congestion that fluctuates. She denies any swallowing issues. She has not seen her ENT about her ear pain and nasal congestion.   Denies any fevers/chills, drenching night sweats, or new bone pains.   REVIEW OF SYSTEMS:   10 Point review of systems of done and is negative except as noted above.  MEDICAL HISTORY Past Medical History:  Diagnosis Date   Anxiety 12/09/2013   Arthritis  Cataract    Diabetes mellitus    Diverticulitis    patient states has had three attacks    Diverticulosis    DJD (degenerative joint disease) 12/14/2014   Glaucoma    Hx of adenomatous colonic polyps    Hyperlipidemia    Hypertension    Intraductal papilloma    bx nef x 2 in the 70s-90s   Saccular aneurysm    SCC (squamous cell carcinoma) 03/2011    sees dermatology    SURGICAL HISTORY Past Surgical History:  Procedure Laterality Date   BREAST BIOPSY  '91, '95   INTRADUCTAL PAPILLOMA   BREAST LUMPECTOMY     CATARACT EXTRACTION Bilateral 09/2014   CLOSED REDUCTION ULNAR SHAFT Right 10/06/2017   Procedure: CLOSED REDUCTION DISLOCATED RIGHT SHOULDER;  Surgeon: Vernetta Lonni GRADE, MD;  Location: [facility] OR;  Service: Orthopedics;  Laterality: Right;   COLONOSCOPY      SOCIAL HISTORY Social History   Tobacco Use   Smoking status: Never    Passive exposure: Past   Smokeless tobacco: Never  Vaping Use   Vaping status: Never Used  Substance Use Topics   Alcohol use: Not Currently   Drug use: No    Social History   Social History Narrative   Born in [place], ,moved to the [place]  in the 60s, live in [place] then in [place]   Lives w/ husband   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, Retail   Son lives in [place] ([place]     SOCIAL DRIVERS OF HEALTH SDOH Screenings   Food Insecurity: No Food Insecurity (12/04/2022)  Housing: Low Risk  (12/04/2022)  Utilities: Not At Risk (12/04/2022)  Alcohol Screen: Low Risk  (12/04/2022)  Depression (PHQ2-9): Low Risk  (05/29/2023)  Financial Resource Strain: Medium Risk (04/21/2022)  Physical Activity: Inactive (12/04/2022)  Social Connections: Moderately Isolated (12/04/2022)  Stress: No Stress Concern Present (12/04/2022)  Tobacco Use: Low Risk  (02/14/2024)  Health Literacy: Adequate Health Literacy (12/04/2022)     FAMILY HISTORY Family History  Adopted: Yes  Problem Relation Age of Onset   Heart disease Mother 51   Heart disease Father 12   Skin cancer Sister    Colon cancer Maternal Grandfather        COLON,, FAMILY HX 7 MEMBERS   Breast cancer Maternal Aunt    Stroke Maternal Aunt    Breast cancer Maternal Aunt    Colon cancer Paternal Uncle        7 uncles   Rectal cancer Neg Hx    Stomach cancer Neg Hx      ALLERGIES: is allergic to ciprofloxacin  hcl and ciprofloxacin .  MEDICATIONS  Current Outpatient Medications   Medication Sig Dispense Refill   Accu-Chek Softclix Lancets lancets 1 each by Other route daily. Dx E11.9 100 each 12   ALPRAZolam  (XANAX ) 0.25 MG tablet TAKE 1 TABLET(0.25 MG) BY MOUTH DAILY AS NEEDED FOR ANXIETY 30 tablet 0   Blood Glucose Monitoring Suppl (ACCU-CHEK GUIDE) w/Device KIT 1 each by Does not apply route daily. 1 kit 1   carvedilol  (COREG ) 25 MG tablet TAKE 1 TABLET(25 MG) BY MOUTH TWICE DAILY WITH A MEAL 180 tablet 1   cholecalciferol (VITAMIN D ) 1000 UNITS tablet Take 1,000 Units by mouth daily.     co-enzyme Q-10 30 MG capsule Take 30 mg by mouth daily.     dorzolamide-timolol (COSOPT) 22.3-6.8 MG/ML ophthalmic solution Place 1 drop into both eyes 2 (two) times daily.      glucose blood (ACCU-CHEK GUIDE TEST) test strip 1  each by Other route daily. Dx E11.9 100 each 12   metFORMIN  (GLUCOPHAGE ) 1000 MG tablet TAKE 1 TABLET BY MOUTH WITH BREAKFAST AND LUNCH THEN TAKE 1/2 TABLET BY MOUTH AT BEDTIME 225 tablet 1   NYSTATIN  powder Apply 1 Application topically 3 (three) times daily.     ONETOUCH ULTRA TEST test strip USE TO CHECK BLOOD SUGAR UP TO 2 TIMES PER DAY 200 strip 12   OneTouch UltraSoft 2 Lancets MISC TEST BLOOD SUGAR NO MOORE THEN TWICE DAILY 200 each 12   RESTASIS 0.05 % ophthalmic emulsion Place 1 drop into both eyes as needed.     Semaglutide ,0.25 or 0.5MG /DOS, 2 MG/3ML SOPN INJECT 0.5 UNDER THE SKIN ONCE A WEEK     No current facility-administered medications for this visit.    PHYSICAL EXAMINATION: ECOG PERFORMANCE STATUS: 2 - Symptomatic, <50% confined to bed VITALS: Vitals:   03/11/24 1418  BP: 116/82  Pulse: 74  Resp: 18  Temp: 97.9 F (36.6 C)  SpO2: 96%   Filed Weights   03/11/24 1418  Weight: 137 lb 11.2 oz (62.5 kg)   Body mass index is 26.02 kg/m.  GENERAL: alert, in no acute distress and comfortable SKIN: no acute rashes, no significant lesions EYES: conjunctiva are pink and non-injected, sclera anicteric OROPHARYNX: MMM, no exudates,  no oropharyngeal erythema or ulceration NECK: supple, no JVD LYMPH:  no palpable lymphadenopathy in the cervical, axillary or inguinal regions LUNGS: clear to auscultation b/l with normal respiratory effort HEART: regular rate & rhythm ABDOMEN:  normoactive bowel sounds , non tender, not distended, no hepatosplenomegaly Extremity: no pedal edema PSYCH: alert & oriented x 3 with fluent speech NEURO: no focal motor/sensory deficits  LABORATORY DATA:   I have reviewed the data as listed     Latest Ref Rng & Units 02/26/2024    2:56 PM 09/04/2023   12:13 PM 03/06/2023   11:20 AM  CBC EXTENDED  WBC 4.0 - 10.5 K/uL 9.9  9.2  7.7   RBC 3.87 - 5.11 MIL/uL 4.48  4.40  4.43   Hemoglobin 12.0 - 15.0 g/dL 86.1  86.1  86.3   HCT 36.0 - 46.0 % 41.8  40.5  41.7   Platelets 150 - 400 K/uL 315  303  305   NEUT# 1.7 - 7.7 K/uL 5.7  5.5  4.6   Lymph# 0.7 - 4.0 K/uL 2.6  2.3  2.0        Latest Ref Rng & Units 02/26/2024    2:56 PM 09/04/2023   12:13 PM 06/15/2023   11:24 AM  CMP  Glucose 70 - 99 mg/dL 72  97    BUN 8 - 23 mg/dL 22  29    Creatinine 9.55 - 1.00 mg/dL 9.09  9.10    Sodium 864 - 145 mmol/L 137  137    Potassium 3.5 - 5.1 mmol/L 4.8  4.3    Chloride 98 - 111 mmol/L 101  102    CO2 22 - 32 mmol/L 29  30    Calcium  8.9 - 10.3 mg/dL 89.5  89.2    89.6    Total Protein 6.5 - 8.1 g/dL 8.9  8.4  7.5   Total Bilirubin 0.0 - 1.2 mg/dL 0.4  0.5    Alkaline Phos 38 - 126 U/L 79  64    AST 15 - 41 U/L 26  27    ALT 0 - 44 U/L 15  21       RADIOGRAPHIC  STUDIES: I have personally reviewed the radiological images as listed and agreed with the findings in the report. No results found.  ASSESSMENT & PLAN:  89 y.o. 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 Most consistent with MGUS. This was picked up on workup for chronic idiopathic urticaria. Patient has normal tryptase levels and borderline elevated double-stranded DNA  levels.   PLAN: - Discussed lab results on 03/11/2024 in detail with patient: -blood counts are stable today -13.8 hemoglobin -WBC is borderline high, may be due to allergies -blood chemistry looks good today -M spike is slightly elevated at 0.7g/dl compared to last time 9.3h/io -we will continue to monitor these levels every 6 months -recommends a hot air humidifier at night -Labs in 22 weeks RTC with Dr Onesimo in 24 weeks  FOLLOW-UP   Labs in 22 weeks RTC with Dr Onesimo in 24 weeks   The total time spent in the appointment was 20 minutes* .  All of the patient's questions were answered and the patient knows to call the clinic with any problems, questions, or concerns.  Emaline Onesimo MD MS AAHIVMS [facility] Hematology/Oncology Physician [facility]  *Total Encounter Time as defined by the Centers for Medicare and Medicaid Services includes, in addition to the face-to-face time of a patient visit (documented in the note above) non-face-to-face time: obtaining and reviewing outside history, ordering and reviewing medications, tests or procedures, care coordination (communications with other health care professionals or caregivers) and documentation in the medical record.  I, Alan Blowers, acting as a neurosurgeon for Emaline Onesimo, MD.,have documented all relevant documentation on the behalf of Emaline Onesimo, MD,as directed by  Emaline Onesimo, MD while in the presence of Emaline Onesimo, MD.  I have reviewed the above documentation for accuracy and completeness, and I agree with the above.  Nhu Glasby, MD

## 2024-03-20 NOTE — Progress Notes (Unsigned)
  Cardiology Office Note:   Date:  03/21/2024  ID:  Patricia Moore, Patricia Moore 17-Mar-1934, MRN [id number] PCP: Theophilus Andrews, Tully GRADE, MD  [facility] Providers Cardiologist:  Lynwood Schilling, MD {  History of Present Illness:   Patricia Moore is a 89 y.o. female who presents for follow up of a syncope.   This happened on February 2017.  She woke up and she felt dizzy.  She has been treated with meclizine .   She has been treated for labyrinthitis as well.  She had a normal echo.  She did have monitor in 2018 that demonstrated NSVT.    She had a negative POET (Plain Old Exercise Treadmill).  She has had brief runs of PSVT.   She had SOB earlier in 2024 and she had negative PET study.    Since I last saw her she done relatively well.  She is not getting any new chest pressure, neck or arm discomfort.  She is not getting any new shortness of breath, PND or orthopnea.  She has had no palpitations, presyncope or syncope.  She said no weight gain or edema.  ROS: As stated in the HPI and negative for all other systems.  Studies Reviewed:    EKG:   EKG Interpretation Date/Time:  Friday March 21 2024 16:17:52 EST Ventricular Rate:  77 PR Interval:  186 QRS Duration:  86 QT Interval:  364 QTC Calculation: 411 R Axis:   -16  Text Interpretation: Normal sinus rhythm with sinus arrhythmia Non specific inferior Q waves 30-Jul-2015 09:49, of previous Q waves are new Confirmed by Schilling Lynwood ([id number]) on 03/21/2024 4:54:35 PM   Risk Assessment/Calculations:              Physical Exam:   VS:  BP 135/69 (BP Location: Left Arm, Patient Position: Sitting, Cuff Size: Normal)   Pulse 77   Ht 5' 1 (1.549 m)   Wt 137 lb (62.1 kg)   LMP 04/17/1988   SpO2 99%   BMI 25.89 kg/m    Wt Readings from Last 3 Encounters:  03/21/24 137 lb (62.1 kg)  03/11/24 137 lb 11.2 oz (62.5 kg)  02/14/24 140 lb 1.6 oz (63.5 kg)     GEN: Well nourished, well developed in no acute distress NECK: No JVD; No  carotid bruits CARDIAC: RRR, 3 out of 6 holosystolic murmur heard at the apex and  anteriorly, no diastolic murmurs, rubs, gallops RESPIRATORY:  Clear to auscultation without rales, wheezing or rhonchi  ABDOMEN: Soft, non-tender, non-distended EXTREMITIES:  No edema; No deformity   ASSESSMENT AND PLAN:    Palpitations/NSVT/PSVT: She is not having any palpitations.  No change in therapy.   Mitral valve regurgitation:   Her murmur is slightly more prominent.  Has been 4 years since her last echo and I will order a follow-up echocardiogram.  Hypertension:   Her blood pressure is mildly elevated today but usually well-controlled.  She will keep an eye on this at home.   Follow up with me in 1 year  Signed, Lynwood Schilling, MD

## 2024-03-21 ENCOUNTER — Encounter: Payer: Self-pay | Admitting: Cardiology

## 2024-03-21 ENCOUNTER — Ambulatory Visit: Attending: Cardiology | Admitting: Cardiology

## 2024-03-21 VITALS — BP 135/69 | HR 77 | Ht 61.0 in | Wt 137.0 lb

## 2024-03-21 DIAGNOSIS — I10 Essential (primary) hypertension: Secondary | ICD-10-CM | POA: Diagnosis not present

## 2024-03-21 DIAGNOSIS — R42 Dizziness and giddiness: Secondary | ICD-10-CM | POA: Diagnosis not present

## 2024-03-21 DIAGNOSIS — R0602 Shortness of breath: Secondary | ICD-10-CM | POA: Diagnosis not present

## 2024-03-21 DIAGNOSIS — I340 Nonrheumatic mitral (valve) insufficiency: Secondary | ICD-10-CM

## 2024-03-21 DIAGNOSIS — R002 Palpitations: Secondary | ICD-10-CM

## 2024-03-21 NOTE — Patient Instructions (Signed)
 Medication Instructions:  Your physician recommends that you continue on your current medications as directed. Please refer to the Current Medication list given to you today.  *If you need a refill on your cardiac medications before your next appointment, please call your pharmacy*  Lab Work: NONE If you have labs (blood work) drawn today and your tests are completely normal, you will receive your results only by: MyChart Message (if you have MyChart) OR A paper copy in the mail If you have any lab test that is abnormal or we need to change your treatment, we will call you to review the results.  Testing/Procedures: Echocardiogram Your physician has requested that you have an echocardiogram. Echocardiography is a painless test that uses sound waves to create images of your heart. It provides your doctor with information about the size and shape of your heart and how well your heart's chambers and valves are working. This procedure takes approximately one hour. There are no restrictions for this procedure. Please do NOT wear cologne, perfume, aftershave, or lotions (deodorant is allowed). Please arrive 15 minutes prior to your appointment time.  Please note: We ask at that you not bring children with you during ultrasound (echo/ vascular) testing. Due to room size and safety concerns, children are not allowed in the ultrasound rooms during exams. Our front office staff cannot provide observation of children in our lobby area while testing is being conducted. An adult accompanying a patient to their appointment will only be allowed in the ultrasound room at the discretion of the ultrasound technician under special circumstances. We apologize for any inconvenience.   Follow-Up: At [facility], you and your health needs are our priority.  As part of our continuing mission to provide you with exceptional heart care, our providers are all part of one team.  This team includes your primary  Cardiologist (physician) and Advanced Practice Providers or APPs (Physician Assistants and Nurse Practitioners) who all work together to provide you with the care you need, when you need it.  Your next appointment:   1 year(s)  Provider:   Eilleen Grates, MD    We recommend signing up for the patient portal called MyChart.  Sign up information is provided on this After Visit Summary.  MyChart is used to connect with patients for Virtual Visits (Telemedicine).  Patients are able to view lab/test results, encounter notes, upcoming appointments, etc.  Non-urgent messages can be sent to your provider as well.   To learn more about what you can do with MyChart, go to [email or web address].

## 2024-03-24 ENCOUNTER — Ambulatory Visit (HOSPITAL_COMMUNITY)
Admission: RE | Admit: 2024-03-24 | Discharge: 2024-03-24 | Disposition: A | Source: Ambulatory Visit | Attending: Cardiology | Admitting: Cardiology

## 2024-03-24 DIAGNOSIS — I340 Nonrheumatic mitral (valve) insufficiency: Secondary | ICD-10-CM

## 2024-03-24 LAB — ECHOCARDIOGRAM COMPLETE
Area-P 1/2: 4.31 cm2
S' Lateral: 3.7 cm

## 2024-03-27 ENCOUNTER — Other Ambulatory Visit: Payer: Self-pay | Admitting: *Deleted

## 2024-03-27 ENCOUNTER — Ambulatory Visit: Admitting: Orthopedic Surgery

## 2024-03-27 DIAGNOSIS — M542 Cervicalgia: Secondary | ICD-10-CM | POA: Diagnosis not present

## 2024-03-27 DIAGNOSIS — L508 Other urticaria: Secondary | ICD-10-CM

## 2024-03-27 DIAGNOSIS — R700 Elevated erythrocyte sedimentation rate: Secondary | ICD-10-CM

## 2024-03-27 MED ORDER — TRAMADOL HCL 50 MG PO TABS: 50.0000 mg | ORAL_TABLET | Freq: Four times a day (QID) | ORAL | 0 refills | Status: DC | PRN

## 2024-03-27 NOTE — Progress Notes (Signed)
 Orthopedic Spine Surgery Office Note   Assessment: Patient is a 89 y.o. female with chronic neck pain after a C1 and C2 fracture     Plan: -Out of bed as tolerated, can use soft collar for comfort -New prescription of tramadol  sent in for her today -Patient should return to office in 3 months, x-rays at next visit: none     Patient expressed understanding of the plan and all questions were answered to the patient's satisfaction.    ___________________________________________________________________________     History:   Patient is a 89 y.o. female who presents today for follow up of C1 and C2 fractures.  Still having upper cervical pain.  No recent changes in her symptoms.  No radiating arm pain.  No symptoms of myelopathy.  Continues to find 1/2 tablet of tramadol  helpful at night on some nights.  20 pills it is generally lasting her about 2 to 3 months.   Treatments tried: collar use, activity modification, oxycodone , tylenol , NSAIDs, tramadol      Physical Exam:   General: no acute distress, appears stated age Neurologic: alert, answering questions appropriately, following commands Respiratory: unlabored breathing on room air, symmetric chest rise Psychiatric: appropriate affect, normal cadence to speech     MSK (spine):   -Strength exam                                                   Left                  Right Grip strength                5/5                  5/5 Interosseus                  5/5                  5/5 Wrist extension            5/5                  5/5 Wrist flexion                 5/5                  5/5 Elbow flexion                5/5                  5/5 Deltoid                          5/5                  4/5     -Sensory exam                         Sensation intact to light touch in C5-T1 nerve distributions of bilateral upper extremities     Imaging: XRs of the cervical spine from 06/15/2022 were previously independently reviewed and  interpreted, showing ~38mm lateral mass overhang on the left side at C1/2. Displaced C2 lateral mass with depression. Decreased joint space at C1/2 on the left. No lateral  mass overhang on the contralateral side. Degenerative changes throughout the cervical spine.      Patient name: Patricia Moore Patient MRN: [id number] Date of visit: 03/27/2024

## 2024-03-28 ENCOUNTER — Ambulatory Visit: Payer: Self-pay | Admitting: Internal Medicine

## 2024-03-28 ENCOUNTER — Ambulatory Visit: Payer: Self-pay | Admitting: Cardiology

## 2024-03-28 LAB — C-REACTIVE PROTEIN: CRP: 4.2 mg/L (ref <8.0)

## 2024-03-28 LAB — SEDIMENTATION RATE: Sed Rate: 28 mm/h (ref 0–30)
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
## 2024-04-01 ENCOUNTER — Ambulatory Visit: Payer: Medicare Other | Admitting: Neurology

## 2024-04-01 VITALS — BP 151/75 | HR 83 | Ht 61.0 in | Wt 138.0 lb

## 2024-04-01 DIAGNOSIS — E1142 Type 2 diabetes mellitus with diabetic polyneuropathy: Secondary | ICD-10-CM

## 2024-04-01 DIAGNOSIS — M47812 Spondylosis without myelopathy or radiculopathy, cervical region: Secondary | ICD-10-CM

## 2024-04-02 ENCOUNTER — Encounter: Payer: Self-pay | Admitting: Neurology

## 2024-04-07 ENCOUNTER — Emergency Department (HOSPITAL_BASED_OUTPATIENT_CLINIC_OR_DEPARTMENT_OTHER)

## 2024-04-07 ENCOUNTER — Other Ambulatory Visit: Payer: Self-pay

## 2024-04-07 ENCOUNTER — Emergency Department (HOSPITAL_BASED_OUTPATIENT_CLINIC_OR_DEPARTMENT_OTHER): Admitting: Radiology

## 2024-04-07 ENCOUNTER — Ambulatory Visit: Payer: Self-pay

## 2024-04-07 ENCOUNTER — Emergency Department (HOSPITAL_BASED_OUTPATIENT_CLINIC_OR_DEPARTMENT_OTHER)
Admission: EM | Admit: 2024-04-07 | Discharge: 2024-04-07 | Disposition: A | Discharge status: Home / Self Care | Attending: Emergency Medicine | Admitting: Emergency Medicine

## 2024-04-07 DIAGNOSIS — Z23 Encounter for immunization: Secondary | ICD-10-CM | POA: Insufficient documentation

## 2024-04-07 DIAGNOSIS — S299XXA Unspecified injury of thorax, initial encounter: Secondary | ICD-10-CM | POA: Diagnosis present

## 2024-04-07 DIAGNOSIS — S2232XA Fracture of one rib, left side, initial encounter for closed fracture: Secondary | ICD-10-CM | POA: Diagnosis not present

## 2024-04-07 DIAGNOSIS — Z7984 Long term (current) use of oral hypoglycemic drugs: Secondary | ICD-10-CM | POA: Insufficient documentation

## 2024-04-07 DIAGNOSIS — Z79899 Other long term (current) drug therapy: Secondary | ICD-10-CM | POA: Insufficient documentation

## 2024-04-07 DIAGNOSIS — S0083XA Contusion of other part of head, initial encounter: Secondary | ICD-10-CM | POA: Insufficient documentation

## 2024-04-07 DIAGNOSIS — R911 Solitary pulmonary nodule: Secondary | ICD-10-CM | POA: Diagnosis not present

## 2024-04-07 DIAGNOSIS — W19XXXA Unspecified fall, initial encounter: Secondary | ICD-10-CM

## 2024-04-07 DIAGNOSIS — W010XXA Fall on same level from slipping, tripping and stumbling without subsequent striking against object, initial encounter: Secondary | ICD-10-CM | POA: Insufficient documentation

## 2024-04-07 DIAGNOSIS — T148XXA Other injury of unspecified body region, initial encounter: Secondary | ICD-10-CM

## 2024-04-07 MED ORDER — ACETAMINOPHEN 325 MG PO TABS
650.0000 mg | ORAL_TABLET | Freq: Once | ORAL | Status: DC
  Filled 2024-04-07: qty 2

## 2024-04-07 MED ORDER — TETANUS-DIPHTH-ACELL PERTUSSIS 5-2-15.5 LF-MCG/0.5 IM SUSP
0.5000 mL | Freq: Once | INTRAMUSCULAR | Status: AC
  Administered 2024-04-07: 0.5 mL via INTRAMUSCULAR
  Filled 2024-04-07: qty 0.5

## 2024-04-07 MED ORDER — HYDROCODONE-ACETAMINOPHEN 5-325 MG PO TABS
1.0000 | ORAL_TABLET | Freq: Once | ORAL | Status: AC
  Administered 2024-04-07: 1 via ORAL
  Filled 2024-04-07 (×2): qty 1
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
## 2024-04-07 NOTE — ED Provider Notes (Signed)
 " [place] EMERGENCY DEPARTMENT AT [facility] Provider Note   CSN: [id number] Arrival date & time: 04/07/24  1121     Patient presents with: Fall   Melaya DEBIE ASHLINE is a 89 y.o. female here for evaluation mechanical fall.  Had a fall 4 days ago.  Tripped and fell.  Has had pain to her left lateral ribs, left hip, left forehead.  She noted after she fell she developed a lump to her left forehead.  Noted today she woke up and she had bruising surrounding her eyes.  She denies any vision change.  No neck pain, back pain, shortness of breath, hemoptysis.  No numbness or weakness.  Has been ambulatory since the fall.  Last tetanus greater than 5 years ago   HPI     Prior to Admission medications  Medication Sig Start Date End Date Taking? Authorizing Provider  Accu-Chek Softclix Lancets lancets 1 each by Other route daily. Dx E11.9 02/11/24   Theophilus Andrews, Tully GRADE, MD  ALPRAZolam  (XANAX ) 0.25 MG tablet TAKE 1 TABLET(0.25 MG) BY MOUTH DAILY AS NEEDED FOR ANXIETY 12/05/23   Theophilus Andrews, Tully GRADE, MD  Blood Glucose Monitoring Suppl (ACCU-CHEK GUIDE) w/Device KIT 1 each by Does not apply route daily. 02/11/24   Theophilus Andrews, Tully GRADE, MD  carvedilol  (COREG ) 25 MG tablet TAKE 1 TABLET(25 MG) BY MOUTH TWICE DAILY WITH A MEAL 01/09/24   Theophilus Andrews, Tully GRADE, MD  cholecalciferol (VITAMIN D ) 1000 UNITS tablet Take 1,000 Units by mouth daily.    [provider]  co-enzyme Q-10 30 MG capsule Take 30 mg by mouth daily.    [provider]  dorzolamide-timolol (COSOPT) 22.3-6.8 MG/ML ophthalmic solution Place 1 drop into both eyes 2 (two) times daily.  04/03/16   [provider]  glucose blood (ACCU-CHEK GUIDE TEST) test strip 1 each by Other route daily. Dx E11.9 02/11/24   Theophilus Andrews, Tully GRADE, MD  metFORMIN  (GLUCOPHAGE ) 1000 MG tablet TAKE 1 TABLET BY MOUTH WITH BREAKFAST AND LUNCH THEN TAKE 1/2 TABLET BY MOUTH AT BEDTIME 03/11/24   Theophilus Andrews, Tully GRADE, MD  NYSTATIN  powder Apply 1 Application topically 3 (three) times daily. 05/21/23   [provider]  AISHA FLING TEST test strip USE TO CHECK BLOOD SUGAR UP TO 2 TIMES PER DAY 10/23/23   Theophilus Andrews, Tully GRADE, MD  OneTouch UltraSoft 2 Lancets MISC TEST BLOOD SUGAR NO MOORE THEN TWICE DAILY 07/24/23   Theophilus Andrews, Tully GRADE, MD  RESTASIS 0.05 % ophthalmic emulsion Place 1 drop into both eyes as needed. 11/15/18   [provider]  Semaglutide ,0.25 or 0.5MG /DOS, 2 MG/3ML SOPN INJECT 0.5 UNDER THE SKIN ONCE A WEEK 01/01/23   Theophilus Andrews, Tully GRADE, MD  traMADol  (ULTRAM ) 50 MG tablet Take 1 tablet (50 mg total) by mouth every 6 (six) hours as needed for severe pain (pain score 7-10). 03/27/24   Georgina Ozell LABOR, MD    Allergies: Ciprofloxacin  hcl and Ciprofloxacin     Review of Systems  Constitutional: Negative.   HENT: Negative.    Respiratory: Negative.    Cardiovascular:        Left rib pain  Genitourinary: Negative.   Musculoskeletal:        Left hip pain  Skin:  Positive for wound.  Neurological: Negative.   All other systems reviewed and are negative.   Updated Vital Signs BP (!) 146/68   Pulse 66   Temp 97.8 F (36.6 C)  Resp 16   LMP 04/17/1988   SpO2 97%   Physical Exam Vitals and nursing note reviewed.  Constitutional:      General: She is not in acute distress.    Appearance: She is well-developed. She is not ill-appearing, toxic-appearing or diaphoretic.  HENT:     Head: Normocephalic.     Comments: Raccoon eyes left greater than right.  No hemotympanums, Battle sign.  Soft tissue hematoma left forehead, overlying deep abrasion    Right Ear: Tympanic membrane, ear canal and external ear normal. There is no impacted cerumen.     Left Ear: Tympanic membrane, ear canal and external ear normal. There is no impacted cerumen.     Nose: Nose normal.     Mouth/Throat:     Mouth: Mucous membranes are moist.     Comments: No  loose dentition.  No drooling, dysphagia or trismus Eyes:     Pupils: Pupils are equal, round, and reactive to light.  Neck:     Comments: No midline cervical tenderness, refused c-collar Cardiovascular:     Rate and Rhythm: Normal rate.     Pulses: Normal pulses.          Radial pulses are 2+ on the right side and 2+ on the left side.       Dorsalis pedis pulses are 2+ on the right side and 2+ on the left side.     Heart sounds: Normal heart sounds.  Pulmonary:     Effort: Pulmonary effort is normal. No respiratory distress.     Breath sounds: Normal breath sounds.     Comments: Clear bilaterally, speaks in full sentences without difficulty Chest:     Comments: Tenderness left lateral aspect anterior ribs.  No crepitus, step-off.  No bruising. Abdominal:     General: Bowel sounds are normal. There is no distension.     Palpations: Abdomen is soft.     Tenderness: There is no abdominal tenderness. There is no right CVA tenderness, left CVA tenderness or guarding.     Comments: Soft, nontender, no rebound or guard  Musculoskeletal:        General: Normal range of motion.     Cervical back: Normal range of motion and neck supple.     Comments: Mild tenderness left lateral aspect hip.  No shortening or rotation of legs.  Able to flex and extend without difficulty.  No midline C/T/L tenderness  Skin:    General: Skin is warm and dry.     Comments: Ecchymosis face  Neurological:     General: No focal deficit present.     Mental Status: She is alert.  Psychiatric:        Mood and Affect: Mood normal.     (all labs ordered are listed, but only abnormal results are displayed) Labs Reviewed - No data to display  EKG: None  Radiology: CT Maxillofacial Wo Contrast Result Date: 04/07/2024 CLINICAL DATA:  Blunt facial trauma. Fall 1 week ago. Bruising to left eye. EXAM: CT MAXILLOFACIAL WITHOUT CONTRAST TECHNIQUE: Multidetector CT imaging of the maxillofacial structures was  performed. Multiplanar CT image reconstructions were also generated. RADIATION DOSE REDUCTION: This exam was performed according to the departmental dose-optimization program which includes automated exposure control, adjustment of the mA and/or kV according to patient size and/or use of iterative reconstruction technique. COMPARISON:  None Available. FINDINGS: Osseous: No acute fracture of the nasal bone, zygomatic arches or mandibles. The temporomandibular joints are congruent. The nasal septum is  midline. Orbits: No acute orbital fracture. No evidence of globe injury. Bilateral cataract resection Sinuses: Chronic left sphenoid sinus opacification with cortical thickening, unchanged from April 2025 cervical spine CT. No sinus fracture or hemosinus. Soft tissues: Left frontal scalp hematoma. There is mild left periorbital soft tissue thickening. Limited intracranial: Assessed on concurrent head CT, reported separately. IMPRESSION: 1. Left frontal scalp hematoma and mild left periorbital soft tissue thickening. No acute orbital or facial bone fracture. 2. Chronic left sphenoid sinus disease. Electronically Signed   By: Andrea Gasman M.D.   On: 04/07/2024 19:19   CT Cervical Spine Wo Contrast Result Date: 04/07/2024 CLINICAL DATA:  Blunt trauma, fall 1 week ago EXAM: CT CERVICAL SPINE WITHOUT CONTRAST TECHNIQUE: Multidetector CT imaging of the cervical spine was performed without intravenous contrast. Multiplanar CT image reconstructions were also generated. RADIATION DOSE REDUCTION: This exam was performed according to the departmental dose-optimization program which includes automated exposure control, adjustment of the mA and/or kV according to patient size and/or use of iterative reconstruction technique. COMPARISON:  CT cervical spine 07/26/2023 FINDINGS: Alignment: Chronic straightening and mild reversal of normal lordosis. No traumatic subluxation. Skull base and vertebrae: No acute fracture. The  previous C1 and C2 fractures have healed without residual. Vertebral body heights are normal. Posterior elements are intact. Soft tissues and spinal canal: No prevertebral fluid or swelling. No visible canal hematoma. Disc levels: Multilevel degenerative changes, without significant progression from prior exam. Upper chest: Septal thickening at the apices. Please reference concurrently performed chest CT. Other: Carotid calcifications. IMPRESSION: No acute fracture or subluxation of the cervical spine. Electronically Signed   By: Andrea Gasman M.D.   On: 04/07/2024 19:14   CT Chest Wo Contrast Result Date: 04/07/2024 CLINICAL DATA:  Blunt trauma to the chest. EXAM: CT CHEST WITHOUT CONTRAST TECHNIQUE: Multidetector CT imaging of the chest was performed following the standard protocol without IV contrast. RADIATION DOSE REDUCTION: This exam was performed according to the departmental dose-optimization program which includes automated exposure control, adjustment of the mA and/or kV according to patient size and/or use of iterative reconstruction technique. COMPARISON:  None Available. FINDINGS: Evaluation of this exam is limited in the absence of intravenous contrast. Cardiovascular: There is no cardiomegaly or pericardial effusion. Mild atherosclerotic calcification of the thoracic aorta. No aneurysmal dilatation. The central pulmonary arteries are grossly unremarkable. Mediastinum/Nodes: No hilar or mediastinal adenopathy. The esophagus is grossly unremarkable no mediastinal fluid collection. Lungs/Pleura: Bibasilar linear atelectasis/scarring. There is a 6 mm subpleural nodule in the anterior right middle lobe (94/3). There is mild diffuse interstitial and interlobular septal prominence suggestive of edema. No focal consolidation, pleural effusion, pneumothorax. The central airways are patent. Upper Abdomen: No acute abnormality. Musculoskeletal: Osteopenia with degenerative changes of the spine.  Nondisplaced fracture of the anterolateral left seventh rib. IMPRESSION: 1. Nondisplaced fracture of the anterolateral left seventh rib. No pneumothorax. 2. Mild diffuse interstitial and interlobular septal prominence suggestive of edema. 3. A 6 mm subpleural nodule in the anterior right middle lobe. Non-contrast chest CT at 6-12 months is recommended. If the nodule is stable at time of repeat CT, then future CT at 18-24 months (from today's scan) is considered optional for low-risk patients, but is recommended for high-risk patients. This recommendation follows the consensus statement: Guidelines for Management of Incidental Pulmonary Nodules Detected on CT Images: From the Fleischner Society 2017; Radiology 2017; 284:228-243. 4.  Aortic Atherosclerosis (ICD10-I70.0). Electronically Signed   By: Vanetta Chou M.D.   On: 04/07/2024 19:09  CT Head Wo Contrast Result Date: 04/07/2024 CLINICAL DATA:  Fall 1 week ago. Blunt facial trauma. Forehead contusion. EXAM: CT HEAD WITHOUT CONTRAST TECHNIQUE: Contiguous axial images were obtained from the base of the skull through the vertex without intravenous contrast. RADIATION DOSE REDUCTION: This exam was performed according to the departmental dose-optimization program which includes automated exposure control, adjustment of the mA and/or kV according to patient size and/or use of iterative reconstruction technique. COMPARISON:  Head CT 02/01/2022 FINDINGS: Brain: No intracranial hemorrhage, mass effect, or midline shift. Age related atrophy with chronic small vessel ischemia. No hydrocephalus. The basilar cisterns are patent. No evidence of territorial infarct or acute ischemia. No extra-axial or intracranial fluid collection. Vascular: Atherosclerosis of skullbase vasculature without hyperdense vessel or abnormal calcification. Skull: No fracture or focal lesion. Sinuses/Orbits: Assessed on concurrent face CT, reported separately. Other: Left frontal scalp  contusion. IMPRESSION: 1. Left frontal scalp contusion. No acute intracranial abnormality. No skull fracture. 2. Age related atrophy and chronic small vessel ischemia. Electronically Signed   By: Andrea Gasman M.D.   On: 04/07/2024 19:08   DG Femur Min 2 Views Left Result Date: 04/07/2024 CLINICAL DATA:  Status post fall. EXAM: LEFT FEMUR 2 VIEWS COMPARISON:  None Available. FINDINGS: There is no evidence of an acute fracture or dislocation. Soft tissues are unremarkable. IMPRESSION: Negative. Electronically Signed   By: Suzen Dials M.D.   On: 04/07/2024 16:22   DG Pelvis 1-2 Views Result Date: 04/07/2024 CLINICAL DATA:  Status post fall. EXAM: PELVIS - 1-2 VIEW COMPARISON:  October 06, 2017 FINDINGS: There is no evidence of pelvic fracture or diastasis. No pelvic bone lesions are seen. Coarsely calcified uterine fibroids are suspected. IMPRESSION: No acute osseous abnormality. Electronically Signed   By: Suzen Dials M.D.   On: 04/07/2024 16:21     Procedures   Medications Ordered in the ED  HYDROcodone -acetaminophen  (NORCO/VICODIN) 5-325 MG per tablet 1 tablet (0 tablets Oral Hold 04/07/24 1605)  Tdap (ADACEL ) injection 0.5 mL (0.5 mLs Intramuscular Given 04/07/24 4675)    89 year old here for evaluation mechanical fall.  Occurred about 4 days ago.  Tripped and fell at home.  Initially noted hematoma to her left forehead, woke up today she developed some bruising to her bilateral periorbital area.  She has been able to eat, drink, ambulate at home.  No numbness or weakness.  No midline spinal tenderness.  Does have some pain to her left anterior ribs and her left hip.  She has no obvious shortening or rotation of legs.  Her heart and lungs are clear.  Her abdomen is soft, nontender.  She has a nonfocal neuroexam.  Will plan on imaging, pain control, reassess.  Tetanus greater than 96 years old, will update  Imaging personally viewed interpreted X-ray left hip, pelvis without acute  abnormal CT head shows frontal scalp contusion, soft tissue hematoma CT chest without--patient did not want IV placement for contrast shows left rib fracture, pulmonary nodule, follow-up outpatient  Patient reassessed.  Ambulatory here.  Given incentive spirometer.  Discussed CT results with patient, family in room.  Tolerating p.o. intake.  Will have her follow-up outpatient, return for new or worsening symptoms.  The patient has been appropriately medically screened and/or stabilized in the ED. I have low suspicion for any other emergent medical condition which would require further screening, evaluation or treatment in the ED or require inpatient management.  Patient is hemodynamically stable and in no acute distress.  Patient able to ambulate in  department prior to ED.  Evaluation does not show acute pathology that would require ongoing or additional emergent interventions while in the emergency department or further inpatient treatment.  I have discussed the diagnosis with the patient and answered all questions.  Pain is been managed while in the emergency department and patient has no further complaints prior to discharge.  Patient is comfortable with plan discussed in room and is stable for discharge at this time.  I have discussed strict return precautions for returning to the emergency department.  Patient was encouraged to follow-up with PCP/specialist refer to at discharge.                                   Medical Decision Making Amount and/or Complexity of Data Reviewed Independent Historian: spouse    Details: Husband at bedside External Data Reviewed: labs, radiology and notes. Radiology: ordered and independent interpretation performed. Decision-making details documented in ED Course.  Risk OTC drugs. Prescription drug management. Decision regarding hospitalization. Diagnosis or treatment significantly limited by social determinants of health.        Final diagnoses:   Closed fracture of one rib of left side, initial encounter  Pulmonary nodule  Fall, initial encounter  Hematoma of skin    ED Discharge Orders     None          Mable Dara A, PA-C 04/07/24 1954  "

## 2024-04-07 NOTE — ED Triage Notes (Addendum)
 Pt fell 1 week ago and reports feeling pain from her left ribs to hip and contusion to left upper forehead since.  Then this morning, she awoke with bruising to her left eye.  Pt denies blood thinners.  AAOx4 in triage, NAD.

## 2024-04-08 ENCOUNTER — Encounter: Payer: Self-pay | Admitting: Internal Medicine

## 2024-04-08 ENCOUNTER — Ambulatory Visit: Admitting: Internal Medicine

## 2024-04-08 VITALS — BP 120/80 | HR 67 | Temp 98.1°F | Wt 137.7 lb

## 2024-04-08 DIAGNOSIS — F419 Anxiety disorder, unspecified: Secondary | ICD-10-CM

## 2024-04-08 DIAGNOSIS — Z09 Encounter for follow-up examination after completed treatment for conditions other than malignant neoplasm: Secondary | ICD-10-CM | POA: Diagnosis not present

## 2024-04-08 DIAGNOSIS — Z9181 History of falling: Secondary | ICD-10-CM | POA: Diagnosis not present

## 2024-04-08 MED ORDER — TRAMADOL HCL 50 MG PO TABS: 50.0000 mg | ORAL_TABLET | Freq: Four times a day (QID) | ORAL | 0 refills | Status: DC | PRN

## 2024-04-08 MED ORDER — ALPRAZOLAM 0.25 MG PO TABS: ORAL_TABLET | ORAL | 0 refills | Status: DC

## 2024-04-08 NOTE — Progress Notes (Signed)
 "    Established Patient Office Visit     CC/Reason for Visit: ER follow-up, fall  HPI: Patricia Moore is a 89 y.o. female who is coming in today for the above mentioned reasons.  She had a ground-level fall on Saturday.  Slipped in her house and fell on her left side.  Has a goose egg on her left frontal scalp as well as some significant raccoon eyes left greater than right.  She went to the emergency department yesterday.  CT of the head was without injuries but she did have a left rib fracture.  She was given an incentive spirometer and has been doing this religiously.  She is requesting a refill of her tramadol  for pain.   Past Medical/Surgical History: Past Medical History:  Diagnosis Date   Anxiety 12/09/2013   Arthritis    Cataract    Diabetes mellitus    Diverticulitis    patient states has had three attacks    Diverticulosis    DJD (degenerative joint disease) 12/14/2014   Glaucoma    Hx of adenomatous colonic polyps    Hyperlipidemia    Hypertension    Intraductal papilloma    bx nef x 2 in the 70s-90s   Saccular aneurysm    SCC (squamous cell carcinoma) 03/2011   sees dermatology    Past Surgical History:  Procedure Laterality Date   BREAST BIOPSY  '91, '95   INTRADUCTAL PAPILLOMA   BREAST LUMPECTOMY     CATARACT EXTRACTION Bilateral 09/2014   CLOSED REDUCTION ULNAR SHAFT Right 10/06/2017   Procedure: CLOSED REDUCTION DISLOCATED RIGHT SHOULDER;  Surgeon: Vernetta Lonni GRADE, MD;  Location: [facility] OR;  Service: Orthopedics;  Laterality: Right;   COLONOSCOPY      Social History:  reports that she has never smoked. She has been exposed to tobacco smoke. She has never used smokeless tobacco. She reports that she does not currently use alcohol. She reports that she does not use drugs.  Allergies: Allergies[1]  Family History:  Family History  Adopted: Yes  Problem Relation Age of Onset   Heart disease Mother 53   Heart disease Father 64   Skin cancer  Sister    Colon cancer Maternal Grandfather        COLON,, FAMILY HX 7 MEMBERS   Breast cancer Maternal Aunt    Stroke Maternal Aunt    Breast cancer Maternal Aunt    Colon cancer Paternal Uncle        7 uncles   Rectal cancer Neg Hx    Stomach cancer Neg Hx     Current Medications[2]  Review of Systems:  Negative unless indicated in HPI.   Physical Exam: Vitals:   04/08/24 0843  BP: 120/80  Pulse: 67  Temp: 98.1 F (36.7 C)  TempSrc: Oral  SpO2: 97%  Weight: 137 lb 11.2 oz (62.5 kg)    Body mass index is 26.02 kg/m.    Impression and Plan:  Hospital discharge follow-up  Anxiety -     ALPRAZolam ; TAKE 1 TABLET(0.25 MG) BY MOUTH DAILY AS NEEDED FOR ANXIETY  Dispense: 30 tablet; Refill: 0  History of fall -     traMADol  HCl; Take 1 tablet (50 mg total) by mouth every 6 (six) hours as needed for severe pain (pain score 7-10).  Dispense: 20 tablet; Refill: 0  [facility] charts reviewed in detail. - Bruising will slowly improve, advise she continue incentive spirometer given her rib fracture, lung sounds are clear  today.   Time spent:30 minutes reviewing chart, interviewing and examining patient and formulating plan of care.     Tully Theophilus Andrews, MD [facility]     [1]  Allergies Allergen Reactions   Ciprofloxacin  Hcl    Ciprofloxacin  Nausea And Vomiting  [2]  Current Outpatient Medications:    Accu-Chek Softclix Lancets lancets, 1 each by Other route daily. Dx E11.9, Disp: 100 each, Rfl: 12   Blood Glucose Monitoring Suppl (ACCU-CHEK GUIDE) w/Device KIT, 1 each by Does not apply route daily., Disp: 1 kit, Rfl: 1   carvedilol  (COREG ) 25 MG tablet, TAKE 1 TABLET(25 MG) BY MOUTH TWICE DAILY WITH A MEAL, Disp: 180 tablet, Rfl: 1   cholecalciferol (VITAMIN D ) 1000 UNITS tablet, Take 1,000 Units by mouth daily., Disp: , Rfl:    co-enzyme Q-10 30 MG capsule, Take 30 mg by mouth daily., Disp: , Rfl:    dorzolamide-timolol  (COSOPT) 22.3-6.8 MG/ML ophthalmic solution, Place 1 drop into both eyes 2 (two) times daily. , Disp: , Rfl:    glucose blood (ACCU-CHEK GUIDE TEST) test strip, 1 each by Other route daily. Dx E11.9, Disp: 100 each, Rfl: 12   metFORMIN  (GLUCOPHAGE ) 1000 MG tablet, TAKE 1 TABLET BY MOUTH WITH BREAKFAST AND LUNCH THEN TAKE 1/2 TABLET BY MOUTH AT BEDTIME, Disp: 225 tablet, Rfl: 1   NYSTATIN  powder, Apply 1 Application topically 3 (three) times daily., Disp: , Rfl:    ONETOUCH ULTRA TEST test strip, USE TO CHECK BLOOD SUGAR UP TO 2 TIMES PER DAY, Disp: 200 strip, Rfl: 12   OneTouch UltraSoft 2 Lancets MISC, TEST BLOOD SUGAR NO MOORE THEN TWICE DAILY, Disp: 200 each, Rfl: 12   RESTASIS 0.05 % ophthalmic emulsion, Place 1 drop into both eyes as needed., Disp: , Rfl:    Semaglutide ,0.25 or 0.5MG /DOS, 2 MG/3ML SOPN, INJECT 0.5 UNDER THE SKIN ONCE A WEEK, Disp: , Rfl:    ALPRAZolam  (XANAX ) 0.25 MG tablet, TAKE 1 TABLET(0.25 MG) BY MOUTH DAILY AS NEEDED FOR ANXIETY, Disp: 30 tablet, Rfl: 0   traMADol  (ULTRAM ) 50 MG tablet, Take 1 tablet (50 mg total) by mouth every 6 (six) hours as needed for severe pain (pain score 7-10)., Disp: 20 tablet, Rfl: 0  "

## 2024-04-16 NOTE — Progress Notes (Signed)
 Called and spoke with patient and patient's husband regarding echocardiogram results. Patient verbalized understanding. Results faxed to PCP.

## 2024-04-21 ENCOUNTER — Encounter: Payer: Self-pay | Admitting: Internal Medicine

## 2024-04-21 ENCOUNTER — Encounter (INDEPENDENT_AMBULATORY_CARE_PROVIDER_SITE_OTHER): Admitting: Internal Medicine

## 2024-04-21 DIAGNOSIS — Z9181 History of falling: Secondary | ICD-10-CM

## 2024-04-21 DIAGNOSIS — F419 Anxiety disorder, unspecified: Secondary | ICD-10-CM

## 2024-04-21 MED ORDER — TRAMADOL HCL 50 MG PO TABS: 50.0000 mg | ORAL_TABLET | Freq: Four times a day (QID) | ORAL | 0 refills | Status: AC | PRN

## 2024-04-21 MED ORDER — ALPRAZOLAM 0.25 MG PO TABS: ORAL_TABLET | ORAL | 0 refills | Status: AC

## 2024-04-21 NOTE — Progress Notes (Signed)
 This encounter was created in error - please disregard.

## 2024-04-23 ENCOUNTER — Ambulatory Visit

## 2024-04-23 DIAGNOSIS — E119 Type 2 diabetes mellitus without complications: Secondary | ICD-10-CM

## 2024-04-23 NOTE — Progress Notes (Signed)
" ° °  04/23/2024  Patient ID: Patricia Moore, female   DOB: [birth year], 89 y.o.   MRN: [id number]  Patient presented in office today to discuss end of Ozempic  program for Novo. Declined interpreter services.  Discussed that Ozempic  program has ended as of 04/17/24 for medicare patients. In order to continue on Ozempic , patient would need to pick up from pharmacy. Price checked Ozempic  through her insurance, unfortunately due to a high rx deductible and a co-insurance plan, this medication will not be affordable for patient.  Discussed trulicity program through [place], patient may qualify. She does not know exact household income at this time.  Completed application, including provider portion for Trulicity 1.5mg . Patient will call back to provide her income so application can be submitted.  Pending submission and income info at this time. Patient has 3 month supply of Ozempic  on hand while this processes.  Jon VEAR Lindau, PharmD Clinical Pharmacist [phone]  "

## 2024-04-28 ENCOUNTER — Telehealth: Payer: Self-pay

## 2024-04-28 NOTE — Progress Notes (Signed)
" ° °  04/28/2024  Patient ID: Patricia Moore, female   DOB: Nov 25, 1933, 89 y.o.   MRN: [id number]  Faxed in completing application for Trulicity PAP to [facility], pending company decision.  Patricia Moore, PharmD Clinical Pharmacist [phone]  "

## 2024-05-01 ENCOUNTER — Ambulatory Visit: Admitting: Internal Medicine

## 2024-06-25 ENCOUNTER — Ambulatory Visit: Admitting: Orthopedic Surgery

## 2024-08-12 ENCOUNTER — Inpatient Hospital Stay

## 2024-08-12 ENCOUNTER — Ambulatory Visit: Admitting: Internal Medicine

## 2024-08-26 ENCOUNTER — Inpatient Hospital Stay: Admitting: Hematology

## 2025-03-17 ENCOUNTER — Encounter: Admitting: Obstetrics and Gynecology

## 2025-04-01 ENCOUNTER — Ambulatory Visit: Payer: Self-pay | Admitting: Neurology
